# Patient Record
Sex: Female | Born: 1975 | Hispanic: Yes | Marital: Married | Smoking: Never smoker
Health system: Southern US, Community
[De-identification: ages and names within clinical notes are randomized; demographics above are authoritative.]

## PROBLEM LIST (undated history)

## (undated) ENCOUNTER — Emergency Department (HOSPITAL_COMMUNITY): Admission: EM | Payer: Self-pay | Source: Home / Self Care

## (undated) DIAGNOSIS — C801 Malignant (primary) neoplasm, unspecified: Secondary | ICD-10-CM

---

## 2013-01-15 ENCOUNTER — Encounter (HOSPITAL_COMMUNITY): Payer: Self-pay | Admitting: Emergency Medicine

## 2013-01-15 ENCOUNTER — Other Ambulatory Visit: Payer: Self-pay

## 2013-01-15 ENCOUNTER — Emergency Department (HOSPITAL_COMMUNITY)
Admission: EM | Admit: 2013-01-15 | Discharge: 2013-01-15 | Disposition: A | Discharge status: Home / Self Care | Payer: Self-pay | Attending: Emergency Medicine | Admitting: Emergency Medicine

## 2013-01-15 ENCOUNTER — Emergency Department (HOSPITAL_COMMUNITY): Payer: Self-pay

## 2013-01-15 DIAGNOSIS — R0789 Other chest pain: Secondary | ICD-10-CM

## 2013-01-15 DIAGNOSIS — M25519 Pain in unspecified shoulder: Secondary | ICD-10-CM | POA: Insufficient documentation

## 2013-01-15 DIAGNOSIS — IMO0001 Reserved for inherently not codable concepts without codable children: Secondary | ICD-10-CM | POA: Insufficient documentation

## 2013-01-15 DIAGNOSIS — Z79899 Other long term (current) drug therapy: Secondary | ICD-10-CM | POA: Insufficient documentation

## 2013-01-15 DIAGNOSIS — R071 Chest pain on breathing: Secondary | ICD-10-CM | POA: Insufficient documentation

## 2013-01-15 LAB — POCT I-STAT, CHEM 8
Calcium, Ion: 1.18 mmol/L (ref 1.12–1.23)
Glucose, Bld: 86 mg/dL (ref 70–99)
HCT: 39 % (ref 36.0–46.0)
TCO2: 22 mmol/L (ref 0–100)

## 2013-01-15 LAB — POCT I-STAT TROPONIN I: Troponin i, poc: 0 ng/mL (ref 0.00–0.08)

## 2013-01-15 MED ORDER — METHOCARBAMOL 500 MG PO TABS: 500.0000 mg | ORAL_TABLET | Freq: Two times a day (BID) | ORAL | Status: DC

## 2013-01-15 MED ORDER — KETOROLAC TROMETHAMINE 60 MG/2ML IM SOLN
60.0000 mg | Freq: Once | INTRAMUSCULAR | Status: AC
  Administered 2013-01-15: 60 mg via INTRAMUSCULAR
  Filled 2013-01-15: qty 2

## 2013-01-15 MED ORDER — METHOCARBAMOL 500 MG PO TABS
500.0000 mg | ORAL_TABLET | Freq: Once | ORAL | Status: AC
  Administered 2013-01-15: 500 mg via ORAL
  Filled 2013-01-15: qty 1

## 2013-01-15 MED ORDER — IBUPROFEN 600 MG PO TABS: 600.0000 mg | ORAL_TABLET | Freq: Four times a day (QID) | ORAL | Status: DC | PRN

## 2013-01-15 NOTE — ED Notes (Signed)
PT. REPORTS LEFT CHEST PAIN ONSET YESTERDAY RADIATING TO LEFT ARM ,SLIGHT SOB ,DENIES NAUSEA OR VOMITTING , NO DIAPHORESIS . PT. TOOK 2 ASA PO PTA.

## 2013-01-15 NOTE — ED Notes (Addendum)
0522  Introduced self to the pt.  Family at bedside with the pt.  0630 Pt resting and no complaints at this time.

## 2013-01-15 NOTE — ED Provider Notes (Signed)
History    CSN: [id number] Arrival date & time 01/15/13  0457  First MD Initiated Contact with Patient 01/15/13 0515     Chief Complaint  Patient presents with  . Chest Pain   (Consider location/radiation/quality/duration/timing/severity/associated sxs/prior Treatment) HPI Pt presents with 1 day of L sided chest and shoulder pain. No known trauma or heavy lifting. Pain is worse with movement of L shoulder and palpation of chest wall. No cough, SOB, lower ext swelling or pain. No known PE or CAD risk factors.  History reviewed. No pertinent past medical history. Past Surgical History  Procedure Laterality Date  . Cesarean section     No family history on file. History  Substance Use Topics  . Smoking status: Never Smoker   . Smokeless tobacco: Not on file  . Alcohol Use: No   OB History   Grav Para Term Preterm Abortions TAB SAB Ect Mult Living                 Review of Systems  Constitutional: Negative for fever and chills.  Respiratory: Negative for cough, shortness of breath and wheezing.   Cardiovascular: Positive for chest pain. Negative for palpitations and leg swelling.  Gastrointestinal: Negative for nausea, vomiting and abdominal pain.  Musculoskeletal: Positive for myalgias. Negative for back pain.  Skin: Negative for rash and wound.  Neurological: Negative for dizziness, weakness, light-headedness, numbness and headaches.  All other systems reviewed and are negative.    Allergies  Review of patient's allergies indicates no known allergies.  Home Medications   Current Outpatient Rx  Name  Route  Sig  Dispense  Refill  . ibuprofen (ADVIL,MOTRIN) 600 MG tablet   Oral   Take 1 tablet (600 mg total) by mouth every 6 (six) hours as needed for pain.   30 tablet   0   . methocarbamol (ROBAXIN) 500 MG tablet   Oral   Take 1 tablet (500 mg total) by mouth 2 (two) times daily.   20 tablet   0    BP 102/67  Pulse 73  Temp(Src) 98.2 F (36.8 C) (Oral)   Resp 20  SpO2 100%  LMP 01/05/2013 Physical Exam  Nursing note and vitals reviewed. Constitutional: She is oriented to person, place, and time. She appears well-developed and well-nourished. No distress.  HENT:  Head: Normocephalic and atraumatic.  Mouth/Throat: Oropharynx is clear and moist.  Eyes: EOM are normal. Pupils are equal, round, and reactive to light.  Neck: Normal range of motion. Neck supple.  Cardiovascular: Normal rate and regular rhythm.   Pulmonary/Chest: Effort normal and breath sounds normal. No respiratory distress. She has no wheezes. She has no rales. She exhibits tenderness (reproduced L chest wall tenderness with palpation. no crepitance).  Abdominal: Soft. Bowel sounds are normal. She exhibits no distension and no mass. There is no tenderness. There is no rebound and no guarding.  Musculoskeletal: Normal range of motion. She exhibits tenderness (TTP over L detoid with pain with ROM of L shoulder). She exhibits no edema.  Neurological: She is alert and oriented to person, place, and time.  5/5 motor in all ext, sensation intact  Skin: Skin is warm and dry. No rash noted. No erythema.  Psychiatric: She has a normal mood and affect. Her behavior is normal.    ED Course  Procedures (including critical care time) Labs Reviewed  POCT I-STAT, CHEM 8  POCT I-STAT TROPONIN I   Dg Chest 2 View  01/15/2013   *RADIOLOGY REPORT*  Clinical Data: Left chest pain radiating to the left arm since yesterday.  Shortness of breath.  CHEST - 2 VIEW  Comparison: None.  Findings: Normal heart size and pulmonary vascularity.  Calcified granulomas in the lungs.  No focal airspace consolidation.  No blunting of costophrenic angles.  No pneumothorax.  Mediastinal contours appear intact.  IMPRESSION: No evidence of active pulmonary disease.   Original Report Authenticated By: Burman Nieves, M.D.   1. Chest wall pain      Date: 01/15/2013  Rate: 62  Rhythm: normal sinus rhythm  QRS  Axis: normal  Intervals: normal  ST/T Wave abnormalities: normal  Conduction Disutrbances:none  Narrative Interpretation:   Old EKG Reviewed: none available   MDM  PERC neg, No significant CAD risk factors. Pain is musculoskeletal on exam. Will screen and singe trop should be sufficient to R/O MI given duration of symptoms.  Normal work up. Will treat symptomatically. Return precautions given.   Loren Racer, MD 01/15/13 [phone]

## 2016-01-02 ENCOUNTER — Emergency Department (HOSPITAL_COMMUNITY)
Admission: EM | Admit: 2016-01-02 | Discharge: 2016-01-02 | Disposition: A | Discharge status: Home / Self Care | Payer: Self-pay | Attending: Emergency Medicine | Admitting: Emergency Medicine

## 2016-01-02 ENCOUNTER — Encounter (HOSPITAL_COMMUNITY): Payer: Self-pay | Admitting: Emergency Medicine

## 2016-01-02 DIAGNOSIS — L723 Sebaceous cyst: Secondary | ICD-10-CM | POA: Insufficient documentation

## 2016-01-02 DIAGNOSIS — L729 Follicular cyst of the skin and subcutaneous tissue, unspecified: Secondary | ICD-10-CM

## 2016-01-02 NOTE — ED Provider Notes (Signed)
CSN: [id number]     Arrival date & time 01/02/16  1911 History  By signing my name below, I, Christina Gutierrez, attest that this documentation has been prepared under the direction and in the presence of Christina Decaire Camprubi-Soms, PA-C. Electronically Signed: Placido SouLogan Gutierrez, ED Scribe. 01/02/2016. 8:41 PM.   Chief Complaint  Patient presents with  . Abscess   Patient is a 40 y.o. female presenting with abscess. The history is provided by the patient. A language interpreter was used (provider).  Abscess Location:  Head/neck Head/neck abscess location:  Scalp Abscess quality: painful   Abscess quality: not draining, no fluctuance, no redness and no warmth   Red streaking: no   Duration: 7847yrs. Progression:  Worsening Pain details:    Quality:  Throbbing   Severity:  Moderate   Duration:  4 days   Timing:  Constant   Progression:  Worsening Chronicity:  Chronic Context: not immunosuppression   Relieved by:  Nothing Exacerbated by: palpation. Ineffective treatments:  Oral antibiotics and NSAIDs (diclofenac) Associated symptoms: no fever, no nausea and no vomiting     HPI Comments: Christina Gutierrez is a 40 y.o. female who presents to the Emergency Department complaining of a point of pain and "knot" to her posterior scalp that has been present x 12 years which worsened beginning 4-5 days ago. She describes her pain as 8/10, constant, throbbing and non-radiating. Her pain worsens with palpation. Pt was seen at a clinic recently for her symptoms and was d/c with diclofenac and an abx which she is unsure of the name, and denies relief with either, further noting she has nearly finished her abx. She denies redness, drainage, warmth, red streaking, fevers, chills, CP, SOB, abd pain, n/v/d/c, dysuria, hematuria, numbness, tingling and weakness. She denies having a PCP at this time.   History reviewed. No pertinent past medical history. Past Surgical History  Procedure Laterality Date  .  Cesarean section     No family history on file. Social History  Substance Use Topics  . Smoking status: Never Smoker   . Smokeless tobacco: None  . Alcohol Use: No   OB History    No data available     Review of Systems  Constitutional: Negative for fever and chills.  Respiratory: Negative for shortness of breath.   Cardiovascular: Negative for chest pain.  Gastrointestinal: Negative for nausea, vomiting, abdominal pain, diarrhea and constipation.  Genitourinary: Negative for dysuria and hematuria.  Musculoskeletal: Positive for myalgias (pain at the scalp knot area). Negative for arthralgias.  Skin: Negative for color change and rash.       +scalp "knot"  Allergic/Immunologic: Negative for immunocompromised state.  Neurological: Negative for weakness and numbness.  Psychiatric/Behavioral: Negative for confusion.   A complete 10 system review of systems was obtained and all systems are negative except as noted in the HPI and PMH.    Allergies  Review of patient's allergies indicates no known allergies.  Home Medications   Prior to Admission medications   Medication Sig Start Date End Date Taking? Authorizing Provider  ibuprofen (ADVIL,MOTRIN) 600 MG tablet Take 1 tablet (600 mg total) by mouth every 6 (six) hours as needed for pain. 01/15/13   Loren Raceravid Yelverton, MD  methocarbamol (ROBAXIN) 500 MG tablet Take 1 tablet (500 mg total) by mouth 2 (two) times daily. 01/15/13   Loren Raceravid Yelverton, MD   BP 102/63 mmHg  Pulse 72  Temp(Src) 98.2 F (36.8 C) (Oral)  Resp 18  Ht 5' (1.524 m)  Wt  160 lb (72.576 kg)  BMI 31.25 kg/m2  SpO2 99%  LMP 12/02/2015    Physical Exam  Constitutional: She is oriented to person, place, and time. Vital signs are normal. She appears well-developed and well-nourished.  Non-toxic appearance. No distress.  Afebrile, nontoxic, NAD  HENT:  Head: Normocephalic and atraumatic.  Mouth/Throat: Mucous membranes are normal.  Eyes: Conjunctivae and EOM  are normal. Right eye exhibits no discharge. Left eye exhibits no discharge.  Neck: Normal range of motion. Neck supple.  Cardiovascular: Normal rate.   Pulmonary/Chest: Effort normal. No respiratory distress.  Abdominal: Normal appearance. She exhibits no distension.  Musculoskeletal: Normal range of motion.  Neurological: She is alert and oriented to person, place, and time. She has normal strength. No sensory deficit.  Skin: Skin is warm, dry and intact. No rash noted.  Posterior scalp with a small ~5 mm well circumscribed circular cyst, with no erythema or warmth, no drainage or swelling, no fluctuance, slightly firm and rubbery, with mild TTP. No surrounding cellulitis. Easily mobile.   Psychiatric: She has a normal mood and affect. Her behavior is normal.  Nursing note and vitals reviewed.   ED Course  Procedures  DIAGNOSTIC STUDIES: Oxygen Saturation is 99% on RA, normal by my interpretation.    COORDINATION OF CARE: 8:40 PM Discussed next steps with pt. Pt verbalized understanding and is agreeable with the plan.   Labs Review Labs Reviewed - No data to display  Imaging Review No results found.   EKG Interpretation None      MDM   Final diagnoses:  Scalp cyst    40 y.o. female with a sebaceous cyst of scalp, present for 8052yrs but became swollen and painful several days ago, seen at a clinic and given Abx and diclofenac which she states hasn't helped. Discussed that this area doesn't appear to be an abscess, feels like a cyst, and doesn't appear infected. Continue abx until completed. Use heat and tylenol/motrin for pain. F/up with [facility] in 1-2wks to establish care and for ultimate management/excision of this cyst. I explained the diagnosis and have given explicit precautions to return to the ER including for any other new or worsening symptoms. The patient understands and accepts the medical plan as it's been dictated and I have answered their questions. Discharge  instructions concerning home care and prescriptions have been given. The patient is STABLE and is discharged to home in good condition.   I personally performed the services described in this documentation, which was scribed in my presence. The recorded information has been reviewed and is accurate.  BP 102/63 mmHg  Pulse 72  Temp(Src) 98.2 F (36.8 C) (Oral)  Resp 18  Ht 5' (1.524 m)  Wt 72.576 kg  BMI 31.25 kg/m2  SpO2 99%  LMP 12/02/2015  No orders of the defined types were placed in this encounter.      76 Wagon RoadMercedes Camprubi-Soms, PA-C 01/02/16 2056  Gwyneth SproutWhitney Plunkett, MD 01/04/16 [phone]

## 2016-01-02 NOTE — ED Notes (Signed)
Pt has red raised knot on the back of her head. States it is very painful to touch.

## 2016-01-02 NOTE — Discharge Instructions (Signed)
Continue taking your home antibiotic until it's completed. Use home pain medications (diclofenac) OR ibuprofen, as well as tylenol as needed for pain. Use heat to the area to help with pain. Follow up with [facility] in 1-2 weeks for recheck of symptoms and to establish medical care. Return to the ER for changes or worsening symptoms.

## 2016-01-10 ENCOUNTER — Ambulatory Visit: Payer: Self-pay | Attending: Internal Medicine | Admitting: Physician Assistant

## 2016-01-10 ENCOUNTER — Encounter: Payer: Self-pay | Admitting: Physician Assistant

## 2016-01-10 VITALS — BP 110/70 | HR 63 | Temp 98.2°F | Resp 16 | Wt 152.0 lb

## 2016-01-10 DIAGNOSIS — L02811 Cutaneous abscess of head [any part, except face]: Secondary | ICD-10-CM

## 2016-01-10 MED ORDER — FLUCONAZOLE 150 MG PO TABS: 150.0000 mg | ORAL_TABLET | Freq: Once | ORAL | Status: DC

## 2016-01-10 MED ORDER — DOXYCYCLINE HYCLATE 100 MG PO TABS: 100.0000 mg | ORAL_TABLET | Freq: Two times a day (BID) | ORAL | Status: DC

## 2016-01-10 NOTE — Progress Notes (Signed)
Pt is in the office today for a scalp cyst Pt was seen in the ED for the cyst 5 days ago the cyst busted Pt pain level today in the office is a 2 Pt stated it still feels full

## 2016-01-10 NOTE — Progress Notes (Signed)
Patient ID: Christina Gutierrez, female   DOB: 1976/04/26, 40 y.o.   MRN: [id number]   Christina Gutierrez, is a 40 y.o. female  [id number]  [id number]  DOB - 1976/04/26  Chief Complaint  Patient presents with  . Cyst        Subjective:  Chief Complaint and HPI: Christina Gutierrez is a 40 y.o. female here today to establish care and for a follow up vist after being seen at an Urgent care and the ED(01/02/2016) for an abscess on her posterior scalp. She took antibiotics for about 7 days.  She first noticed a tiny lump on her posterior scalp about 12 years ago.  It never bothered her until about 1 month ago.  It has never drained or been painful until recently.  [employer] interpreter used.  Painful and draining now with some improvement but no resolution since starting antibiotics. PMH is unremarkable.  Her last period was a couple of weeks ago and she has had her tubes tied.  ED notes reviewed.    ROS:   Constitutional:  No f/c, No night sweats, No unexplained weight loss. EENT:  No vision changes, No blurry vision, No hearing changes. No mouth, throat, or ear problems.  Respiratory: No cough, No SOB Cardiac: No CP, no palpitations GI:  No abd pain, No N/V/D. GU: No Urinary s/sx Musculoskeletal: No joint pain Neuro: No headache, no dizziness, no motor weakness.  Skin: No rash Endocrine:  No polydipsia. No polyuria.  Psych: Denies SI/HI  No problems updated.  ALLERGIES: No Known Allergies  PAST MEDICAL HISTORY: No past medical history on file.  MEDICATIONS AT HOME: Prior to Admission medications   Medication Sig Start Date End Date Taking? Authorizing Provider  ibuprofen (ADVIL,MOTRIN) 600 MG tablet Take 1 tablet (600 mg total) by mouth every 6 (six) hours as needed for pain. 01/15/13  Yes Loren Raceravid Yelverton, MD  methocarbamol (ROBAXIN) 500 MG tablet Take 1 tablet (500 mg total) by mouth 2 (two) times daily. 01/15/13  Yes Loren Raceravid Yelverton, MD  doxycycline  (VIBRA-TABS) 100 MG tablet Take 1 tablet (100 mg total) by mouth 2 (two) times daily. 01/10/16   Anders SimmondsAngela M McClung, PA-C  fluconazole (DIFLUCAN) 150 MG tablet Take 1 tablet (150 mg total) by mouth once. 01/10/16   Anders SimmondsAngela M McClung, PA-C     Objective:  EXAM:   Filed Vitals:   01/10/16 1053  BP: 110/70  Pulse: 63  Temp: 98.2 F (36.8 C)  TempSrc: Oral  Resp: 16  Weight: 152 lb (68.947 kg)  SpO2: 99%    General appearance : A&OX3. NAD. Non-toxic-appearing HEENT: Atraumatic and Normocephalic. Neck: supple, no JVD. No cervical lymphadenopathy. No thyromegaly Chest/Lungs:  Breathing-non-labored, Good air entry bilaterally, breath sounds normal without rales, rhonchi, or wheezing  CVS: S1 S2 regular, no murmurs, gallops, rubs  Neurology:  CN II-XII grossly intact, Non focal.   Psych:  TP linear. J/I WNL. Normal speech. Appropriate eye contact and affect.  Skin on scalp-R posterior scalp with a 1cm abscess that is slightly fluctuant and drains bloody purulent fluid when lightly manipulated.  No surrounding induration.  Culture taken.  Data Review No results found for: HGBA1C   Assessment & Plan   1. Abscess, scalp Doxycycline 100mg  bid X 10days and warm compresses.  Diflucan sent if needed - WOUND CULTURE - Ambulatory referral to Dermatology for definitive removal of cyst(likely sebaceous cyst)  Patient have been counseled extensively about nutrition and exercise  Return in about 6 weeks (around 02/21/2016) for  cpe and bloodwork/establish care.  The patient was given clear instructions to go to ER or return to medical center if symptoms don't improve, worsen or new problems develop. The patient verbalized understanding. The patient was told to call to get lab results if they haven't heard anything in the next week.     Georgian CoAngela McClung, PA-C [facility] [place], [place] [phone]   01/10/2016, 6:56 PM

## 2016-01-10 NOTE — Patient Instructions (Signed)
Absceso °(Abscess) ° Un absceso es una zona infectada que contiene pus y desechos. Puede aparecer en cualquier parte del cuerpo. También se lo conoce como forúnculo o divieso. °CAUSAS  °Ocurre cuando los tejidos se infectan. También puede formarse por obstrucción de las glándulas sebáceas o las glándulas sudoríparas, infección de los folículos pilosos o por una lesión pequeña en la piel. A medida que el organismo lucha contra la infección, se acumula pus en la zona y hace presión debajo de la piel. Esta presión causa dolor. Las personas con un sistema inmunológico debilitado tienen dificultad para luchar contra las infecciones y pueden formar abscesos con más frecuencia.  °SÍNTOMAS  °Generalmente un absceso se forma sobre la piel y se vuelve una masa dolorosa, roja, caliente y sensible. Si se forma debajo de la piel, podrá sentir como una zona blanda, que se mueve, debajo de la piel. Algunos abscesos se abren (ruptura) por sí mismos, pero la mayoría seguirá empeorando si no se lo trata. La infección puede diseminarse hacia otros sitios del cuerpo y finalmente al torrente sanguíneo y hace que el enfermo se sienta mal.  °DIAGNÓSTICO  °El médico le hará una historia clínica y un examen físico. Podrán tomarle una muestra de líquido del absceso y analizarlo para encontrar la causa de la infección. .  °TRATAMIENTO  °El médico le indicará antibióticos para combatir la infección. Sin embargo, el uso de antibióticos solamente no curará el absceso. El médico tendrá que hacer un pequeño corte (incisión) en el absceso para drenar el pus. En algunos casos se introduce una gasa en el absceso para reducir el dolor y que siga drenando la zona.  °INSTRUCCIONES PARA EL CUIDADO EN EL HOGAR  °· Solo tome medicamentos de venta libre o recetados para el dolor, malestar o fiebre, según las indicaciones del médico. °· Si le han recetado antibióticos, tómelos según las indicaciones. Tómelos todos, aunque se sienta mejor. °· Si le aplicaron  una gasa, siga las indicaciones del médico para cambiarla. °· Para evitar la propagación de la infección: °¨ Mantenga el absceso cubierto con el vendaje. °¨ Lávese bien las manos. °¨ No comparta artículos de cuidado personal, toallas o jacuzzis con los demás. °¨ Evite el contacto con la piel de otras personas. °· Mantenga la piel y la ropa limpia alrededor del absceso. °· Cumpla con todas las visitas de control, según le indique su médico. °SOLICITE ATENCIÓN MÉDICA SI:  °· Aumenta el dolor, la hinchazón, el enrojecimiento, drena líquido o sangra. °· Siente dolores musculares, escalofríos, o una sensación general de malestar. °· Tiene fiebre. °ASEGÚRESE DE QUE:  °· Comprende estas instrucciones. °· Controlará su enfermedad. °· Solicitará ayuda de inmediato si no mejora o si empeora. °  °Esta información no tiene como fin reemplazar el consejo del médico. Asegúrese de hacerle al médico cualquier pregunta que tenga. °  °Document Released: 06/23/2005 Document Revised: 12/23/2011 °Elsevier Interactive Patient Education ©2016 Elsevier Inc. ° °

## 2016-01-13 LAB — WOUND CULTURE
GRAM STAIN: NONE SEEN
Gram Stain: NONE SEEN
Gram Stain: NONE SEEN
Organism ID, Bacteria: NO GROWTH

## 2016-01-24 ENCOUNTER — Ambulatory Visit: Payer: Self-pay | Attending: Physician Assistant | Admitting: Physician Assistant

## 2016-01-24 ENCOUNTER — Encounter: Payer: Self-pay | Admitting: Physician Assistant

## 2016-01-24 VITALS — BP 111/72 | HR 63 | Temp 98.0°F | Resp 16 | Wt 150.0 lb

## 2016-01-24 DIAGNOSIS — L723 Sebaceous cyst: Secondary | ICD-10-CM

## 2016-01-24 MED ORDER — DICLOFENAC SODIUM 75 MG PO TBEC: 75.0000 mg | DELAYED_RELEASE_TABLET | Freq: Two times a day (BID) | ORAL | Status: DC

## 2016-01-24 NOTE — Progress Notes (Signed)
Lump on head for 3 years, ruptured (pus drainage noted) and was treated with pills by ED.  Since rupture has experienced pain, possible nerve symptoms:? completed Diclofenac and SMZ/TMPDS 800/160 bid for 7 days filled 12/31/15. Recent office visit here 01/10/16 was given doxycycline 100mg  bid x 10 days.  Also uses otc icy/hot. Pollyann KennedyKim Becton, RN, BSN

## 2016-01-24 NOTE — Progress Notes (Signed)
Patient ID: Christina LoaJeannette Cavan, female   DOB: 30-Oct-1975, [age] y.o.   MRN: [id number]   Christina LoaJeannette Corniel, is a 40 y.o. female  [id number]  [id number]  DOB - 30-Oct-1975  Subjective:  Chief Complaint and HPI: Christina Gutierrez is a 40 y.o. female here today for f/up of the draining cyst on her head.  [employer] interpreters used. See last notes.  Seen at an urgent care then ED then here.  Has been on Septra X 7 days, then Doxycycline X 10 days after long-standing(12 years) sebaceous cyst on her scalp became infected. She also reports being seen "at another office" a few days ago and was given amoxicillin.  A referral is in process to have the cyst removed.  She still needs to apply for the [facility] card/[facility] discount before she is able to make the appointment.  Today she is requesting more antibiotics and diclofenac for pain. But, the area is much improved.    ROS:   Constitutional:  No f/c, No night sweats, No unexplained weight loss. EENT:  No vision changes, No blurry vision, No hearing changes. No mouth, throat, or ear problems.  Respiratory: No cough, No SOB Cardiac: No CP, no palpitations GI:  No abd pain, No N/V/D. GU: No Urinary s/sx Musculoskeletal: No joint pain Neuro: + headache at location of cyst, no dizziness, no motor weakness.  Skin: No rash Endocrine:  No polydipsia. No polyuria.  Psych: Denies SI/HI  No problems updated.  ALLERGIES: No Known Allergies  PAST MEDICAL HISTORY: No past medical history on file.  MEDICATIONS AT HOME: Prior to Admission medications   Medication Sig Start Date End Date Taking? Authorizing Provider  diclofenac (VOLTAREN) 75 MG EC tablet Take 1 tablet (75 mg total) by mouth 2 (two) times daily. Prn pain 01/24/16   Anders SimmondsAngela M McClung, PA-C  methocarbamol (ROBAXIN) 500 MG tablet Take 1 tablet (500 mg total) by mouth 2 (two) times daily. 01/15/13   Loren Raceravid Yelverton, MD     Objective:  Francia GreavesEXAMCeasar Mons:   Filed Vitals:   01/24/16  0933  BP: 111/72  Pulse: 63  Temp: 98 F (36.7 C)  TempSrc: Oral  Resp: 16  Weight: 150 lb (68.04 kg)  SpO2: 99%    General appearance : A&OX3. NAD. Non-toxic-appearing HEENT: Atraumatic and Normocephalic.  PERRLA. Neck: supple, no JVD. No cervical lymphadenopathy. No thyromegaly Scalp:  The L posterior?occipital region is much improved since I saw her before.  There is a 3-314mm firm cyst without induration, erythema, or active draining.  There is a small scabbed area centrally.  Chest/Lungs:  Breathing-non-labored, Good air entry bilaterally, breath sounds normal without rales, rhonchi, or wheezing  CVS: S1 S2 regular, no murmurs, gallops, rubs  Neurology:  CN II-XII grossly intact, Non focal.   Psych:  TP linear. J/I WNL. Normal speech. Appropriate eye contact and affect.  Skin:  No Rash  Data Review No results found for: HGBA1C   Assessment & Plan   1. Sebaceous cyst Scalp-referral in process and reviewed orange card application and [facility] discount information with patient so we can set up dermatology appointment.  Also discussed she can make payent arrangements up front with the dermatology office if she prefers.  Currently, antibiotics are not indicated.  I did refill the diclofenac for pain.    Patient have been counseled extensively about nutrition and exercise  F/up for CPE and establish care 3-6 months;  Sooner if needed.   The patient was given clear instructions to go to ER or  return to medical center if symptoms don't improve, worsen or new problems develop. The patient verbalized understanding. The patient was told to call to get lab results if they haven't heard anything in the next week.     Georgian Co, PA-C [facility] [place], [place] [phone]   01/24/2016, 1:43 PM

## 2016-01-24 NOTE — Patient Instructions (Signed)
Quiste epidrmico  (Epidermal Cyst) Un quiste epidrmico se denomina tambin quiste sebceo, quiste de inclusin epidrmica o quiste infundibular. Estos quistes contienen una sustancia "pastosa" o similar al "queso" y puede tener mal olor. Esta sustancia es una protena denominada [place]keratina. Estos quistes generalmente se forman en el rostro, el cuello o el tronco. Tambin pueden aparecer en la zona vaginal u otras partes de los genitales, tanto en hombres como en mujeres. En general son pequeos bultos indoloros, que crecen lentamente y que se mueven libremente debajo de la piel. Es importante no tratar de apretarlos para extraer la sustancia que contienen. Esto puede ocasionar una infeccin que origine dolor e hinchazn en el rea.  CAUSAS  La causa del puede ser una lesin penetrante profunda o un folculo piloso obstruido, generalmente asociado al acn.  SNTOMAS  Los quistes epidermicos pueden inflamarse y causar:   Enrojecimiento.  Sensibilidad.  Aumento de la temperatura en la zona.  Material que drena de color blanco grisceo, consistente y de PG[employer] Corporationolor desagradable. DIAGNSTICO Generalmente estas infecciones son diagnosticadas por Medical illustratorel profesional durante el examen fsico. En raras ocasiones ser necesario realizar una biopsia para descartar otros trastornos que parezcan ser similares.  TRATAMIENTO  Generalmente mejoran y desaparecen sin tratamiento. No suelen ser peligrosos.  Pueden inflamarse y sensibilizarse si se infectan. Esto puede requerir Warden/rangerla apertura y drenaje del quiste. Podr ser necesaria la administracin de antibiticos. Cuando la infeccin haya desaparecido, el quiste podr eliminarse con Futures traderuna ciruga menor.  Los pequeos quistes inflamados generalmente pueden tratarse inyectado corticoides con los antibiticos.  En algunos casos el quiste se [place]anda y puede ser [place] preocupacin. Si esto ocurre, es necesario extirparlo quirrgicamente en el consultorio del  profesional. INSTRUCCIONES PARA EL CUIDADO EN EL HOGAR   Tome slo medicamentos de venta libre o recetados, segn las indicaciones del mdico.  Tome los antibiticos como se le indic. Tmelos todos, aunque se sienta mejor. SOLICITE ATENCIN MDICA SI:   Siente dolor, observa enrojecimiento o hinchazn.  El problema no mejora, o empeora.  Tiene preguntas o preocupaciones. ASEGRESE DE QUE:   Comprende estas instrucciones.  Controlar su enfermedad.  Solicitar ayuda de inmediato si no mejora o si empeora.   Esta informacin no tiene Theme park managercomo fin reemplazar el consejo del mdico. Asegrese de hacerle al mdico cualquier pregunta que tenga.   Document Released: 08/04/2006 Document Revised: 09/15/2011 Elsevier Interactive Patient Education [employer]2016 Elsevier Inc.

## 2017-08-08 ENCOUNTER — Emergency Department (HOSPITAL_COMMUNITY): Payer: Self-pay

## 2017-08-08 ENCOUNTER — Emergency Department (HOSPITAL_COMMUNITY)
Admission: EM | Admit: 2017-08-08 | Discharge: 2017-08-08 | Disposition: A | Discharge status: Home / Self Care | Payer: Self-pay | Attending: Emergency Medicine | Admitting: Emergency Medicine

## 2017-08-08 ENCOUNTER — Encounter (HOSPITAL_COMMUNITY): Payer: Self-pay | Admitting: Emergency Medicine

## 2017-08-08 DIAGNOSIS — R109 Unspecified abdominal pain: Secondary | ICD-10-CM

## 2017-08-08 DIAGNOSIS — R1084 Generalized abdominal pain: Secondary | ICD-10-CM | POA: Insufficient documentation

## 2017-08-08 LAB — POC URINE PREG, ED: PREG TEST UR: NEGATIVE

## 2017-08-08 LAB — URINALYSIS, ROUTINE W REFLEX MICROSCOPIC
BACTERIA UA: NONE SEEN
BILIRUBIN URINE: NEGATIVE
Glucose, UA: NEGATIVE mg/dL
HGB URINE DIPSTICK: NEGATIVE
Ketones, ur: NEGATIVE mg/dL
Nitrite: NEGATIVE
Protein, ur: NEGATIVE mg/dL
SPECIFIC GRAVITY, URINE: 1.017 (ref 1.005–1.030)
pH: 5 (ref 5.0–8.0)

## 2017-08-08 MED ORDER — KETOROLAC TROMETHAMINE 60 MG/2ML IM SOLN
60.0000 mg | Freq: Once | INTRAMUSCULAR | Status: AC
Start: 2017-08-08 — End: 2017-08-08
  Administered 2017-08-08: 60 mg via INTRAMUSCULAR
  Filled 2017-08-08: qty 2

## 2017-08-08 NOTE — Discharge Instructions (Signed)
Discuss a CT angiogram for further details of your CT you had today with a primary doctor.  If you were given medicines take as directed.  If you are on coumadin or contraceptives realize their levels and effectiveness is altered by many different medicines.  If you have any reaction (rash, tongues swelling, other) to the medicines stop taking and see a physician.    If your blood pressure was elevated in the ER make sure you follow up for management with a primary doctor or return for chest pain, shortness of breath or stroke symptoms.  Please follow up as directed and return to the ER or see a physician for new or worsening symptoms.  Thank you. Vitals:   08/08/17 1343 08/08/17 1400 08/08/17 1430  BP: 118/66 99/68 94/68   Pulse: 72 76 71  Resp: 18    Temp: 98.2 F (36.8 C)    TempSrc: Oral    SpO2: 100% 99% 99%

## 2017-08-08 NOTE — ED Triage Notes (Signed)
Pt presents with two weeks of left flank pain with burning with urination.

## 2017-08-08 NOTE — ED Provider Notes (Signed)
[facility] EMERGENCY DEPARTMENT Provider Note   CSN: [id number] Arrival date & time: 08/08/17  1334     History   Chief Complaint Chief Complaint  Patient presents with  . Flank Pain    HPI Christina Gutierrez is a 42 y.o. female.  42 year old female presents with 2 weeks of bilateral flank pain as well as some dysuria.  Denies any vaginal bleeding or discharge.  No fever or chills.  No vomiting.  Pain is worse with movement and better with remaining still.  Does have a history of UTIs in the past and feels that this is similar.  Has been using over-the-counter medications without relief.  No rashes or burning to the skin noted.  Has had radiation down her leg at times and symptoms are better in the morning      History reviewed. No pertinent past medical history.  There are no active problems to display for this patient.   Past Surgical History:  Procedure Laterality Date  . CESAREAN SECTION      OB History    No data available       Home Medications    Prior to Admission medications   Medication Sig Start Date End Date Taking? Authorizing Provider  diclofenac (VOLTAREN) 75 MG EC tablet Take 1 tablet (75 mg total) by mouth 2 (two) times daily. Prn pain 01/24/16   Anders Simmonds, PA-C  methocarbamol (ROBAXIN) 500 MG tablet Take 1 tablet (500 mg total) by mouth 2 (two) times daily. 01/15/13   Loren Racer, MD    Family History History reviewed. No pertinent family history.  Social History Social History   Tobacco Use  . Smoking status: Never Smoker  . Smokeless tobacco: Never Used  Substance Use Topics  . Alcohol use: No  . Drug use: No     Allergies   Patient has no known allergies.   Review of Systems Review of Systems  All other systems reviewed and are negative.    Physical Exam Updated Vital Signs BP 99/68   Pulse 76   Temp 98.2 F (36.8 C) (Oral)   Resp 18   SpO2 99%   Physical Exam  Constitutional:  She is oriented to person, place, and time. She appears well-developed and well-nourished.  Non-toxic appearance. No distress.  HENT:  Head: Normocephalic and atraumatic.  Eyes: Conjunctivae, EOM and lids are normal. Pupils are equal, round, and reactive to light.  Neck: Normal range of motion. Neck supple. No tracheal deviation present. No thyroid mass present.  Cardiovascular: Normal rate, regular rhythm and normal heart sounds. Exam reveals no gallop.  No murmur heard. Pulmonary/Chest: Effort normal and breath sounds normal. No stridor. No respiratory distress. She has no decreased breath sounds. She has no wheezes. She has no rhonchi. She has no rales.  Abdominal: Soft. Normal appearance and bowel sounds are normal. She exhibits no distension. There is no tenderness. There is no rebound and no CVA tenderness.  Musculoskeletal: Normal range of motion. She exhibits no edema or tenderness.       Back:  Neurological: She is alert and oriented to person, place, and time. She has normal strength. No cranial nerve deficit or sensory deficit. GCS eye subscore is 4. GCS verbal subscore is 5. GCS motor subscore is 6.  Skin: Skin is warm and dry. No abrasion and no rash noted.  Psychiatric: She has a normal mood and affect. Her speech is normal and behavior is normal.  Nursing note and  vitals reviewed.    ED Treatments / Results  Labs (all labs ordered are listed, but only abnormal results are displayed) Labs Reviewed  URINALYSIS, ROUTINE W REFLEX MICROSCOPIC - Abnormal; Notable for the following components:      Result Value   Leukocytes, UA SMALL (*)    Squamous Epithelial / LPF 0-5 (*)    All other components within normal limits  POC URINE PREG, ED    EKG  EKG Interpretation None       Radiology No results found.  Procedures Procedures (including critical care time)  Medications Ordered in ED Medications - No data to display   Initial Impression / Assessment and Plan / ED  Course  I have reviewed the triage vital signs and the nursing notes.  Pertinent labs & imaging results that were available during my care of the patient were reviewed by me and considered in my medical decision making (see chart for details).     She medicated here with Toradol.  Urinalysis without infection.  Patient likely muscle skeletal back pain but have ordered renal CT.  Care signed out to Dr. Jodi MourningZavitz  Final Clinical Impressions(s) / ED Diagnoses   Final diagnoses:  None    ED Discharge Orders    None       Lorre NickAllen, Lashundra Shiveley, MD 08/08/17 1524

## 2017-08-08 NOTE — ED Provider Notes (Signed)
Patient signed out care to follow up CT scan in discharge for outpatient follow-up. CT scan results reviewed, no acute findings however radiology does recommend CT angiogram outpatient for further delineation of findings- splenic varices.  Updated patient on findings.   Kenton KingfisherJoshua M Hillary Schwegler      Dameion Briles, MD 08/08/17 [phone]

## 2018-02-26 ENCOUNTER — Emergency Department (HOSPITAL_COMMUNITY)
Admission: EM | Admit: 2018-02-26 | Discharge: 2018-02-26 | Disposition: A | Discharge status: Home / Self Care | Payer: Self-pay | Attending: Emergency Medicine | Admitting: Emergency Medicine

## 2018-02-26 ENCOUNTER — Encounter (HOSPITAL_COMMUNITY): Payer: Self-pay

## 2018-02-26 ENCOUNTER — Other Ambulatory Visit: Payer: Self-pay

## 2018-02-26 ENCOUNTER — Emergency Department (HOSPITAL_COMMUNITY): Payer: Self-pay

## 2018-02-26 DIAGNOSIS — Z7982 Long term (current) use of aspirin: Secondary | ICD-10-CM | POA: Insufficient documentation

## 2018-02-26 DIAGNOSIS — N10 Acute tubulo-interstitial nephritis: Secondary | ICD-10-CM | POA: Insufficient documentation

## 2018-02-26 DIAGNOSIS — N12 Tubulo-interstitial nephritis, not specified as acute or chronic: Secondary | ICD-10-CM

## 2018-02-26 LAB — CBC WITH DIFFERENTIAL/PLATELET
ABS IMMATURE GRANULOCYTES: 0 10*3/uL (ref 0.0–0.1)
BASOS ABS: 0.1 10*3/uL (ref 0.0–0.1)
BASOS PCT: 1 %
Eosinophils Absolute: 0.1 10*3/uL (ref 0.0–0.7)
Eosinophils Relative: 2 %
HCT: 40.4 % (ref 36.0–46.0)
Hemoglobin: 12.9 g/dL (ref 12.0–15.0)
Immature Granulocytes: 0 %
Lymphocytes Relative: 38 %
Lymphs Abs: 1.6 10*3/uL (ref 0.7–4.0)
MCH: 31.9 pg (ref 26.0–34.0)
MCHC: 31.9 g/dL (ref 30.0–36.0)
MCV: 99.8 fL (ref 78.0–100.0)
Monocytes Absolute: 0.6 10*3/uL (ref 0.1–1.0)
Monocytes Relative: 13 %
NEUTROS ABS: 1.9 10*3/uL (ref 1.7–7.7)
NEUTROS PCT: 46 %
PLATELETS: 144 10*3/uL — AB (ref 150–400)
RBC: 4.05 MIL/uL (ref 3.87–5.11)
RDW: 13.6 % (ref 11.5–15.5)
WBC: 4.2 10*3/uL (ref 4.0–10.5)

## 2018-02-26 LAB — LIPASE, BLOOD: Lipase: 46 U/L (ref 11–51)

## 2018-02-26 LAB — URINALYSIS, ROUTINE W REFLEX MICROSCOPIC
Bilirubin Urine: NEGATIVE
GLUCOSE, UA: NEGATIVE mg/dL
Hgb urine dipstick: NEGATIVE
Ketones, ur: NEGATIVE mg/dL
Nitrite: NEGATIVE
PROTEIN: NEGATIVE mg/dL
SPECIFIC GRAVITY, URINE: 1.016 (ref 1.005–1.030)
pH: 7 (ref 5.0–8.0)

## 2018-02-26 LAB — COMPREHENSIVE METABOLIC PANEL
ALBUMIN: 3.8 g/dL (ref 3.5–5.0)
ALT: 36 U/L (ref 0–44)
AST: 35 U/L (ref 15–41)
Alkaline Phosphatase: 69 U/L (ref 38–126)
Anion gap: 7 (ref 5–15)
BUN: 6 mg/dL (ref 6–20)
CHLORIDE: 108 mmol/L (ref 98–111)
CO2: 24 mmol/L (ref 22–32)
CREATININE: 0.61 mg/dL (ref 0.44–1.00)
Calcium: 8.8 mg/dL — ABNORMAL LOW (ref 8.9–10.3)
GFR calc Af Amer: >60 mL/min (ref >60)
GFR calc non Af Amer: >60 mL/min (ref >60)
Glucose, Bld: 86 mg/dL (ref 70–99)
Potassium: 4.1 mmol/L (ref 3.5–5.1)
SODIUM: 139 mmol/L (ref 135–145)
Total Bilirubin: 0.6 mg/dL (ref 0.3–1.2)
Total Protein: 6.9 g/dL (ref 6.5–8.1)

## 2018-02-26 LAB — POC URINE PREG, ED: Preg Test, Ur: NEGATIVE

## 2018-02-26 MED ORDER — CEPHALEXIN 500 MG PO CAPS: 500.0000 mg | ORAL_CAPSULE | Freq: Three times a day (TID) | ORAL | 0 refills | Status: DC

## 2018-02-26 MED ORDER — SODIUM CHLORIDE 0.9 % IV BOLUS
1000.0000 mL | Freq: Once | INTRAVENOUS | Status: AC
  Administered 2018-02-26: 1000 mL via INTRAVENOUS

## 2018-02-26 MED ORDER — IBUPROFEN 600 MG PO TABS: 600.0000 mg | ORAL_TABLET | Freq: Four times a day (QID) | ORAL | 0 refills | Status: DC | PRN

## 2018-02-26 MED ORDER — SODIUM CHLORIDE 0.9 % IV SOLN
1.0000 g | Freq: Once | INTRAVENOUS | Status: AC
  Administered 2018-02-26: 1 g via INTRAVENOUS
  Filled 2018-02-26: qty 10

## 2018-02-26 MED ORDER — KETOROLAC TROMETHAMINE 30 MG/ML IJ SOLN
30.0000 mg | Freq: Once | INTRAMUSCULAR | Status: AC
  Administered 2018-02-26: 30 mg via INTRAVENOUS
  Filled 2018-02-26: qty 1

## 2018-02-26 NOTE — Discharge Instructions (Signed)
Take keflex three times daily for a week for kidney infection.   Take motrin for pain.   See your doctor  Return to ER if you have worse flank pain, back pain, trouble urinating, fever, vomiting.

## 2018-02-26 NOTE — ED Notes (Signed)
Patient transported to Ultrasound 

## 2018-02-26 NOTE — ED Provider Notes (Signed)
[facility] EMERGENCY DEPARTMENT Provider Note   CSN: [id number] Arrival date & time: 02/26/18  4782     History   Chief Complaint No chief complaint on file.   HPI Christina Gutierrez is a 42 y.o. female here presenting with left flank pain, dysuria.  Patient has acute onset of left flank pain for the last 2 days.  States that the pain is sharp and radiates to her groin.  Also associated with some dysuria and frequency and pain with urination.  Patient denies any nausea vomiting or fevers.  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   The history is provided by the patient. The history is limited by a language barrier. A language interpreter was used.    History reviewed. No pertinent past medical history.  There are no active problems to display for this patient.   Past Surgical History:  Procedure Laterality Date  . CESAREAN SECTION       OB History   None      Home Medications    Prior to Admission medications   Medication Sig Start Date End Date Taking? Authorizing Provider  aspirin EC 325 MG tablet Take 325 mg by mouth as needed for mild pain.    Yes [provider]    Family History No family history on file.  Social History Social History   Tobacco Use  . Smoking status: Never Smoker  . Smokeless tobacco: Never Used  Substance Use Topics  . Alcohol use: No  . Drug use: No     Allergies   Patient has no known allergies.   Review of Systems Review of Systems  Genitourinary: Positive for dysuria, flank pain and frequency.  All other systems reviewed and are negative.    Physical Exam Updated Vital Signs BP (!) 104/54 (BP Location: Right Arm)   Pulse (!) 58   Temp 98.2 F (36.8 C) (Oral)   Resp 17   SpO2 100%   Physical Exam  Constitutional: She is oriented to person, place, and  time. She appears well-developed.  Slightly uncomfortable   HENT:  Head: Normocephalic.  Mouth/Throat: Oropharynx is clear and moist.  Eyes: Pupils are equal, round, and reactive to light. Conjunctivae and EOM are normal.  Neck: Normal range of motion. Neck supple.  Cardiovascular: Normal rate, regular rhythm and normal heart sounds.  Pulmonary/Chest: Effort normal and breath sounds normal. No stridor. No respiratory distress.  Abdominal: Soft. Bowel sounds are normal.  + L CVAT   Musculoskeletal: Normal range of motion.  Neurological: She is alert and oriented to person, place, and time.  Skin: Skin is warm.  Psychiatric: She has a normal mood and affect.  Nursing note and vitals reviewed.    ED Treatments / Results  Labs (all labs ordered are listed, but only abnormal results are displayed) Labs Reviewed  URINALYSIS, ROUTINE W REFLEX MICROSCOPIC - Abnormal; Notable for the following components:      Result Value   APPearance HAZY (*)    Leukocytes, UA LARGE (*)    Bacteria, UA RARE (*)    All other components within normal limits  CBC WITH DIFFERENTIAL/PLATELET - Abnormal; Notable for the following components:   Platelets 144 (*)    All other components within normal limits  COMPREHENSIVE METABOLIC PANEL - Abnormal; Notable for the following components:   Calcium 8.8 (*)  All other components within normal limits  URINE CULTURE  LIPASE, BLOOD  POC URINE PREG, ED    EKG None  Radiology [place] Renal  Result Date: 02/26/2018 CLINICAL DATA:  Left flank pain. EXAM: RENAL / URINARY TRACT ULTRASOUND COMPLETE COMPARISON:  CT abdomen pelvis dated August 08, 2017. FINDINGS: Right Kidney: Length: 10.9 cm. Echogenicity within normal limits. Mild pelviectasis. No mass or hydronephrosis visualized. 8 mm simple cyst arising from the midpole, unchanged. Left Kidney: Length: 12.6 cm. Echogenicity within normal limits. Mild pelviectasis. No mass or hydronephrosis visualized. Bladder:  Appears normal for degree of bladder distention. IMPRESSION: 1. Mild bilateral pelviectasis without frank hydronephrosis. Electronically Signed   By: Obie DredgeWilliam T Derry M.D.   On: 02/26/2018 11:56    Procedures Procedures (including critical care time)  Medications Ordered in ED Medications  sodium chloride 0.9 % bolus 1,000 mL (0 mLs Intravenous Stopped 02/26/18 1145)  ketorolac (TORADOL) 30 MG/ML injection 30 mg (30 mg Intravenous Given 02/26/18 0946)  cefTRIAXone (ROCEPHIN) 1 g in sodium chloride 0.9 % 100 mL IVPB ( Intravenous Stopped 02/26/18 1059)     Initial Impression / Assessment and Plan / ED Course  I have reviewed the triage vital signs and the nursing notes.  Pertinent labs & imaging results that were available during my care of the patient were reviewed by me and considered in my medical decision making (see chart for details).     Christina Gutierrez is a 42 y.o. female here with L flank pain, dysuria. Consider pyelo vs renal colic. Had previous splenic varices but she appears comfortable so won't need CTA currently. Will get labs, US renal, UA.   12:31 PM UA + bacteria and large leuks. WBC nl. US showed no hydro or stones. Pain controlled with toradol. Will dc home with keflex, motrin.   Final Clinical Impressions(s) / ED Diagnoses   Final diagnoses:  None    ED Discharge Orders    None       Charlynne PanderYao, Jezebelle Ledwell Hsienta, MD 02/26/18 1232

## 2018-02-26 NOTE — ED Triage Notes (Signed)
Patient complains of left lower back pain with dysuria for several days. Pain worse with change in position. All information obtained from sratus interpretor. Reports dark urine with same.

## 2018-02-26 NOTE — ED Notes (Signed)
Interpreter at bedside.

## 2018-02-27 LAB — URINE CULTURE: Culture: NO GROWTH

## 2018-09-23 IMAGING — CT CT RENAL STONE PROTOCOL
2 of 4 series · 16 of 46 positions shown, 18 images · non-contrast
Comparison: None.

CLINICAL DATA: Left flank pain.

EXAM:
CT ABDOMEN AND PELVIS WITHOUT CONTRAST
TECHNIQUE: Multidetector CT imaging of the abdomen and pelvis was performed
following the standard protocol without IV contrast.

[Series 3: stone study 5.0 i30f 2 · axial · 0.68mm/px · z∈[+654,+1029]mm · 13 of 83 slices shown, 15 images]
[im 4/83  soft-tissue]
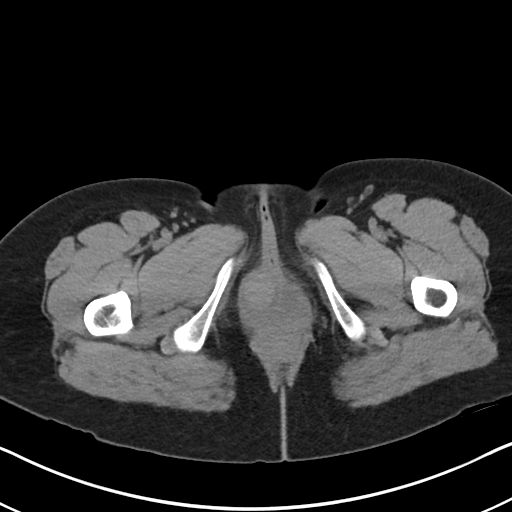
[im 4/83  bone]
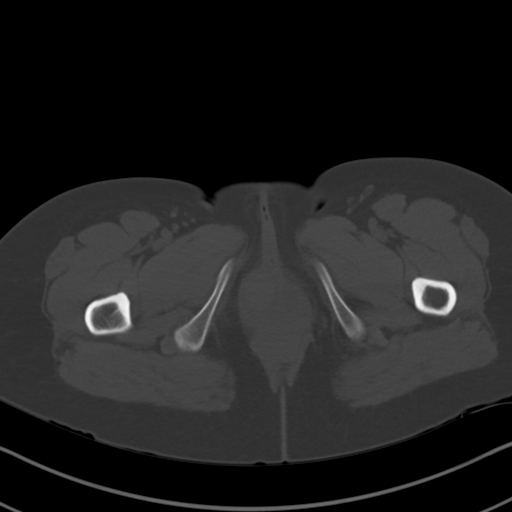
[im 10/83  soft-tissue]
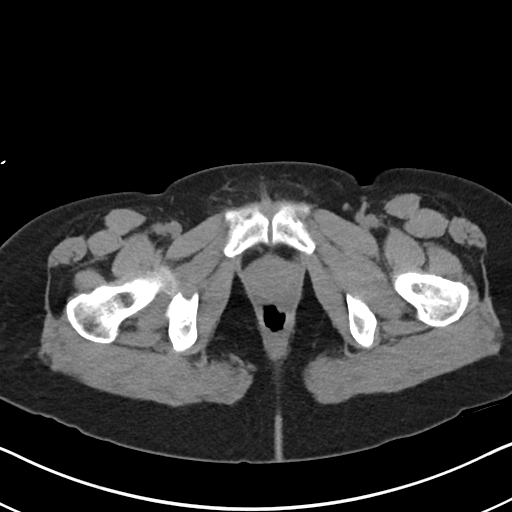
[im 16/83  soft-tissue]
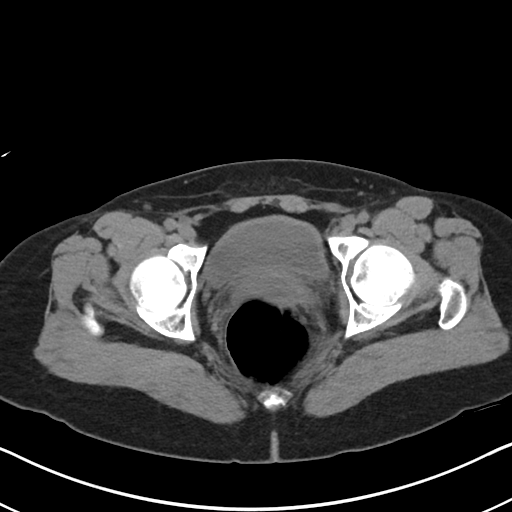
[im 23/83  soft-tissue]
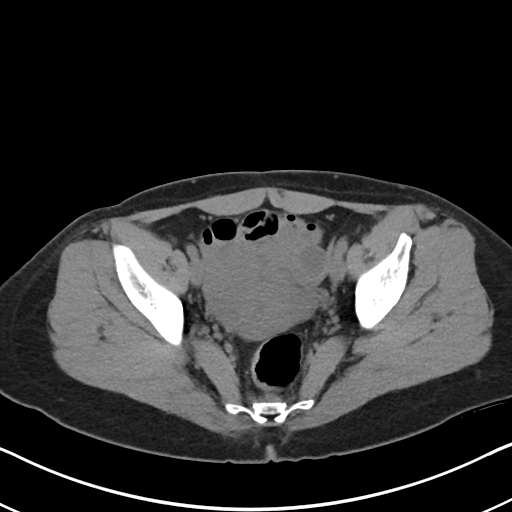
[im 29/83  soft-tissue]
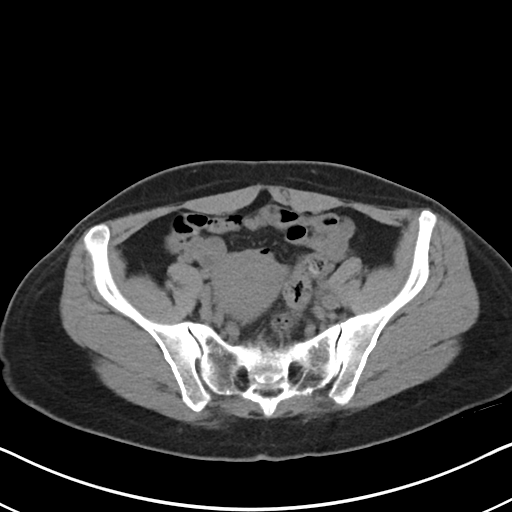
[im 35/83  soft-tissue]
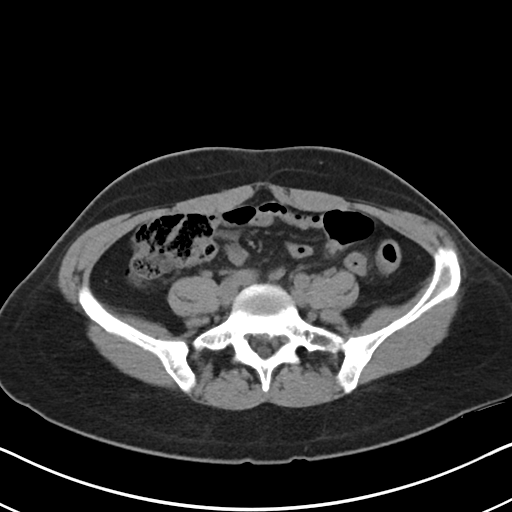
[im 42/83  soft-tissue]
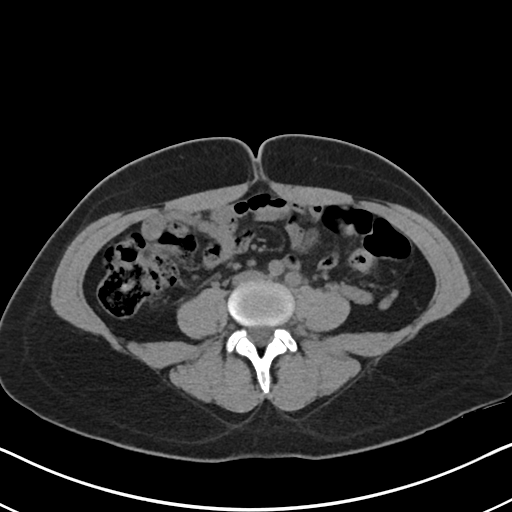
[im 48/83  soft-tissue]
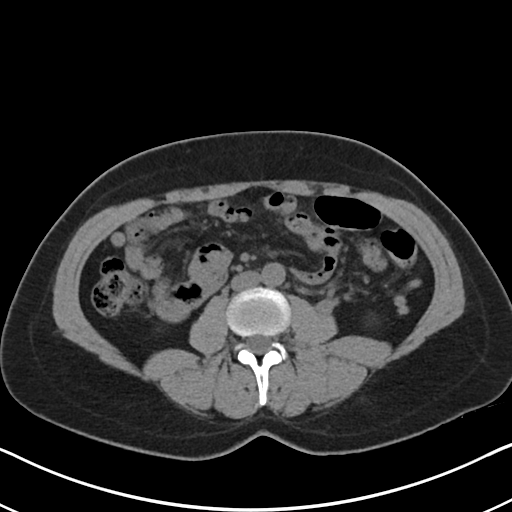
[im 54/83  soft-tissue]
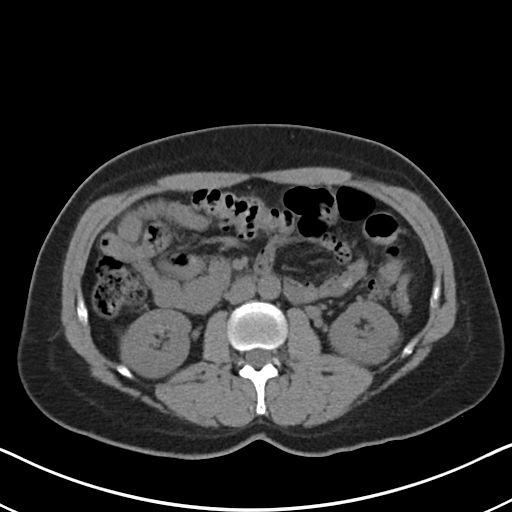
[im 54/83  bone]
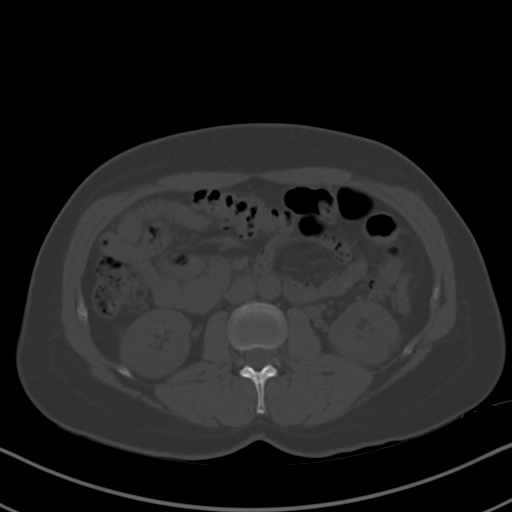
[im 60/83  soft-tissue]
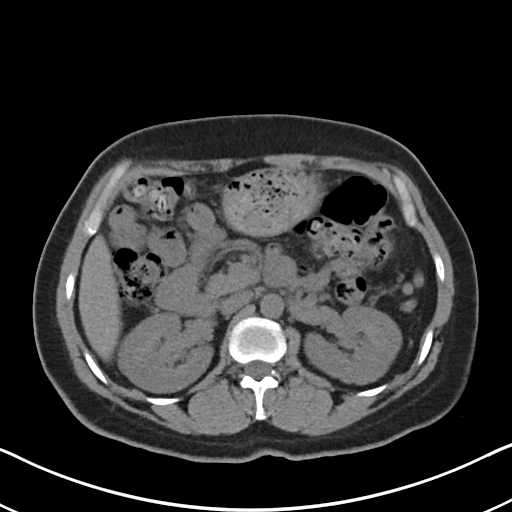
[im 67/83  soft-tissue]
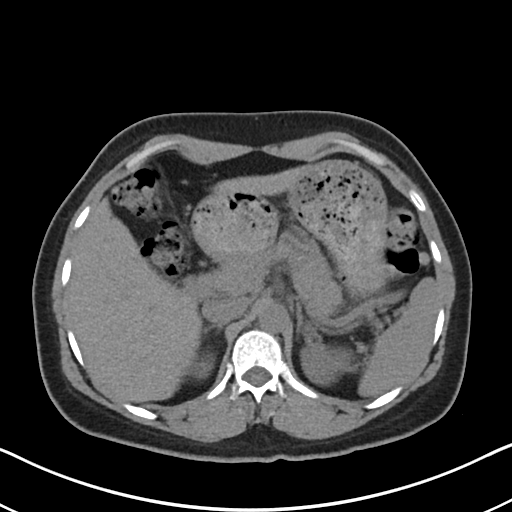
[im 73/83  soft-tissue]
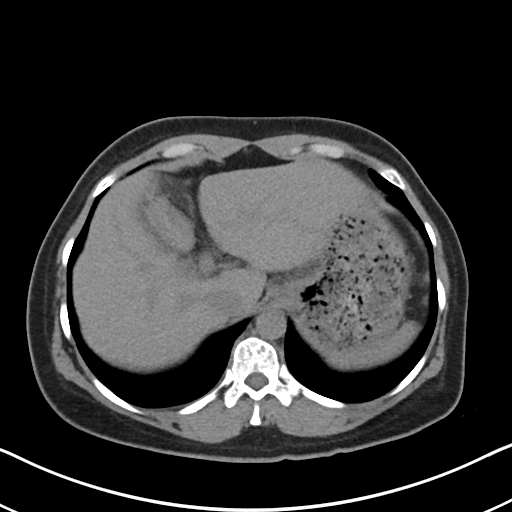
[im 79/83  soft-tissue]
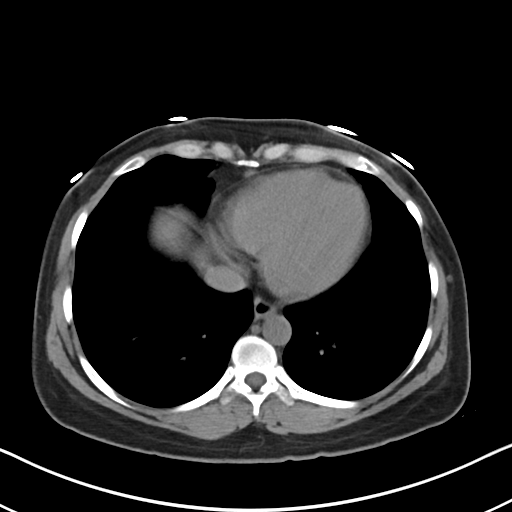

[Series 6: coronal soft tissue · coronal · 0.57mm/px · 3 of 94 slices shown]
[im 32/94  soft-tissue]
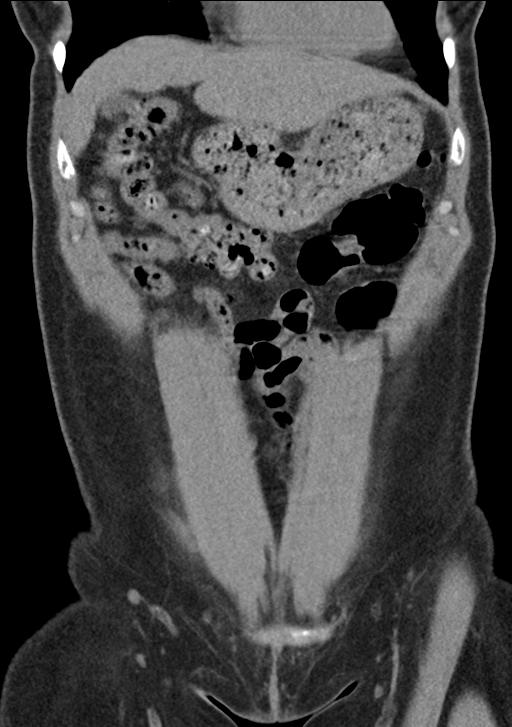
[im 42/94  soft-tissue]
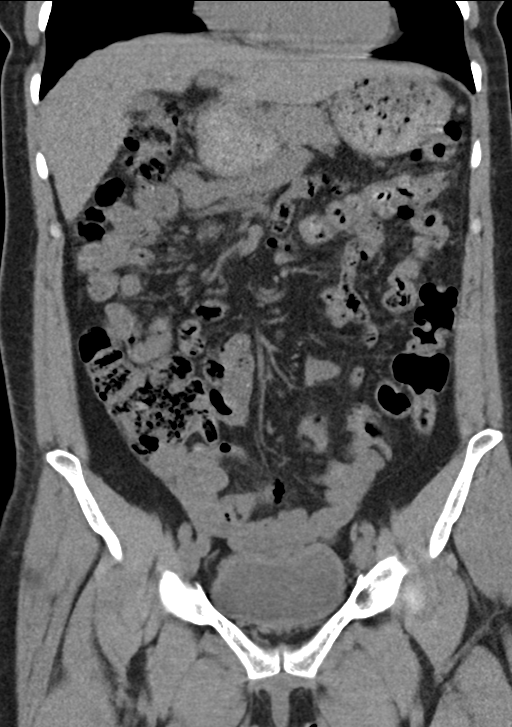
[im 52/94  soft-tissue]
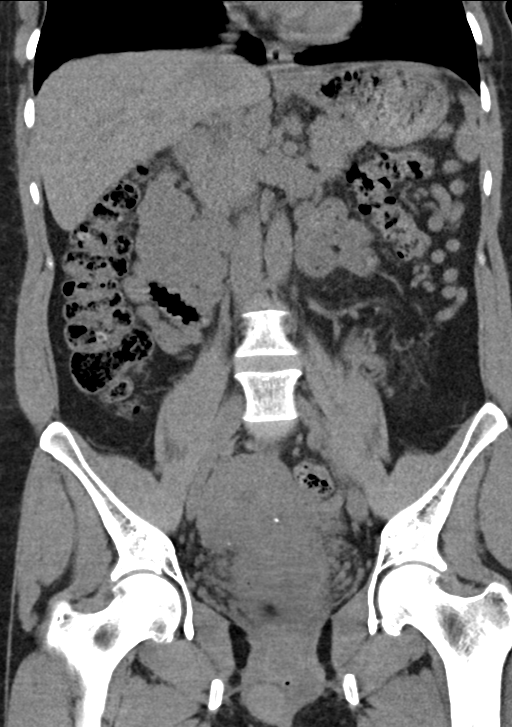

[16 of 46 positions shown; findings below may reference images not displayed]

FINDINGS: Lower chest: No acute abnormality.

Hepatobiliary: No focal liver abnormality is seen. No gallstones,
gallbladder wall thickening, or biliary dilatation.

Pancreas: Unremarkable. No pancreatic ductal dilatation or
surrounding inflammatory changes.

Spleen: Normal in size without focal abnormality.

Adrenals/Urinary Tract: Adrenal glands are unremarkable. Kidneys are
without renal calculi, focal lesion, or hydronephrosis.
Subcentimeter right renal cyst noted. Bladder is unremarkable.

Stomach/Bowel: Stomach is within normal limits. Appendix appears
normal. No evidence of bowel wall thickening, distention, or
inflammatory changes.

Vascular/Lymphatic: No significant vascular findings are present. No
enlarged abdominal or pelvic lymph nodes. Perisplenic and left
pericolic varices, which appears to represent collateral flow
between a diminutive splenic vein and left gonadal vein.

Reproductive: Uterus and bilateral adnexa are unremarkable.

Other: No abdominal wall hernia or abnormality. No abdominopelvic
ascites.

Musculoskeletal: No acute or significant osseous findings.
IMPRESSION: No evidence of obstructive uropathy.

Left infra splenic varices, which appear to represent collateral
flow between a diminutive splenic vein and left gonadal vein.
Evaluation with CT angiogram of the abdomen may be considered to
further characterize this finding.

## 2019-10-22 IMAGING — US US RENAL
1 series · 14 of 25 positions shown · non-contrast
Comparison: CT abdomen pelvis dated August 08, 2017.

CLINICAL DATA: Left flank pain.

EXAM:
RENAL / URINARY TRACT ULTRASOUND COMPLETE

[Series 1: us renal · 0.23mm/px · 14 of 36 slices shown]
[im 1/36]
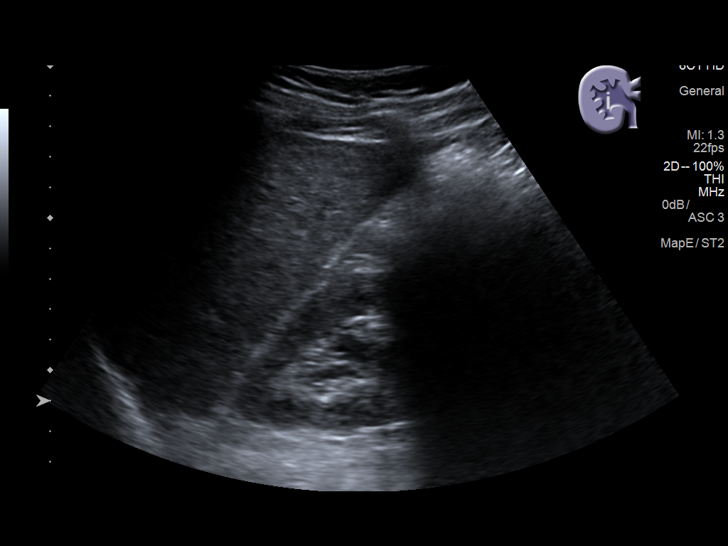
[im 3/36]
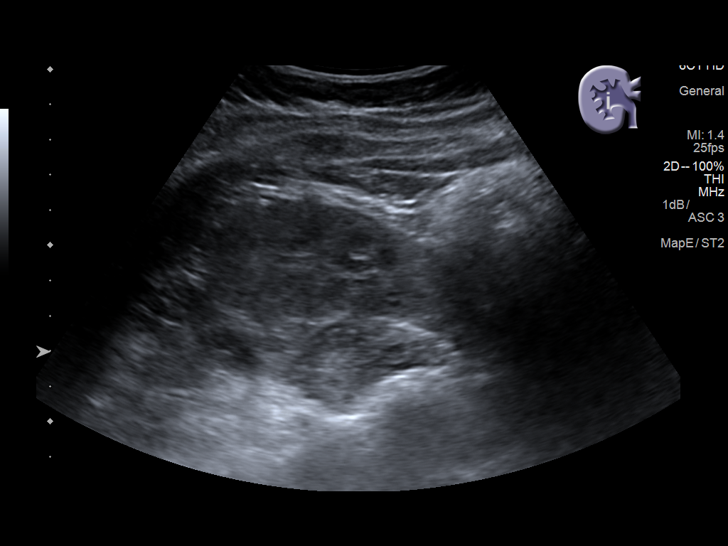
[im 6/36]
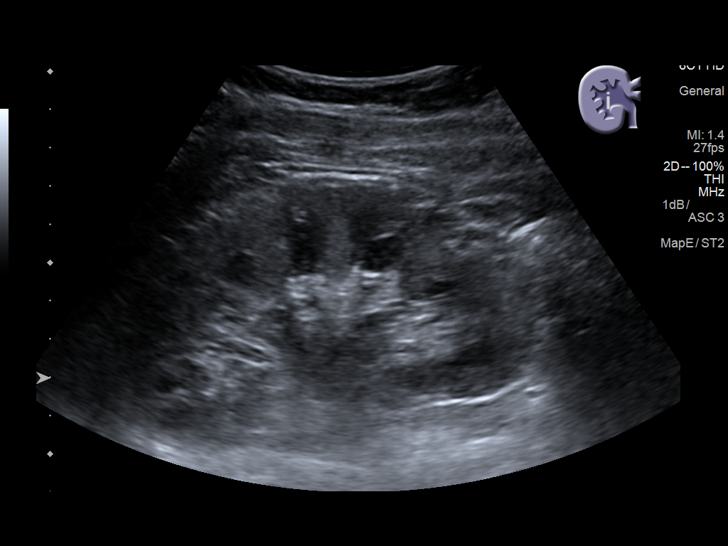
[im 9/36]
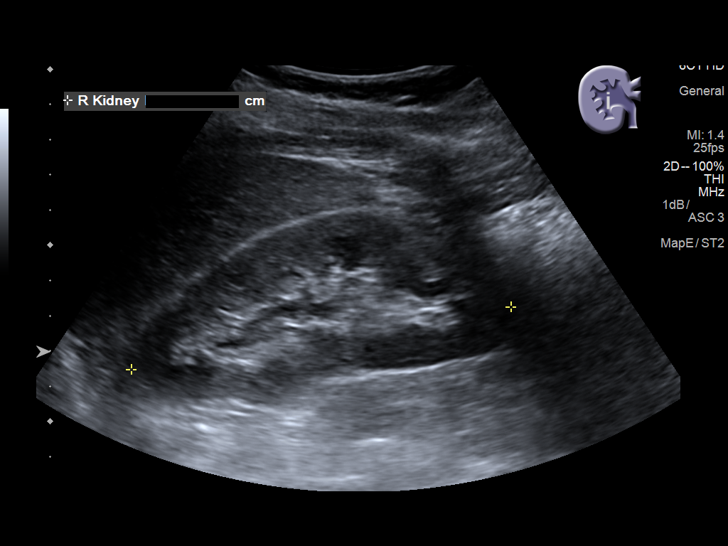
[im 12/36]
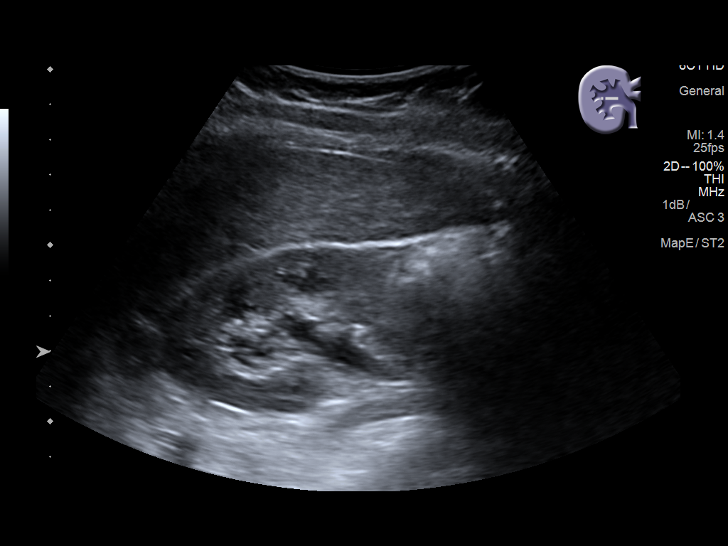
[im 14/36]
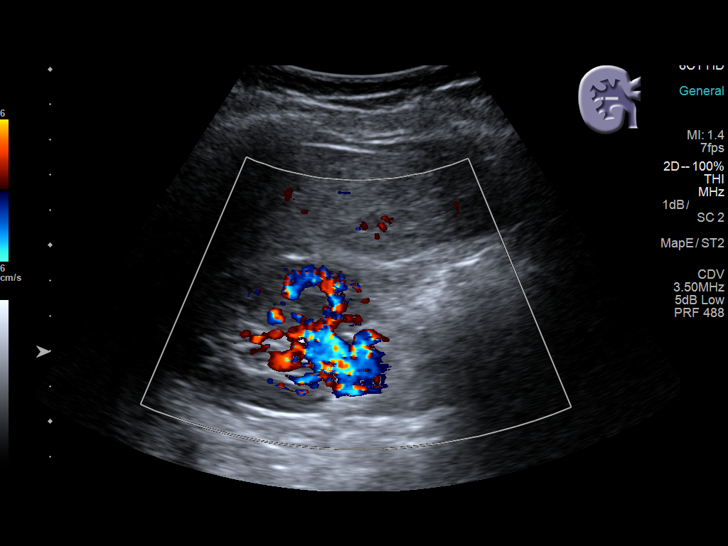
[im 17/36]
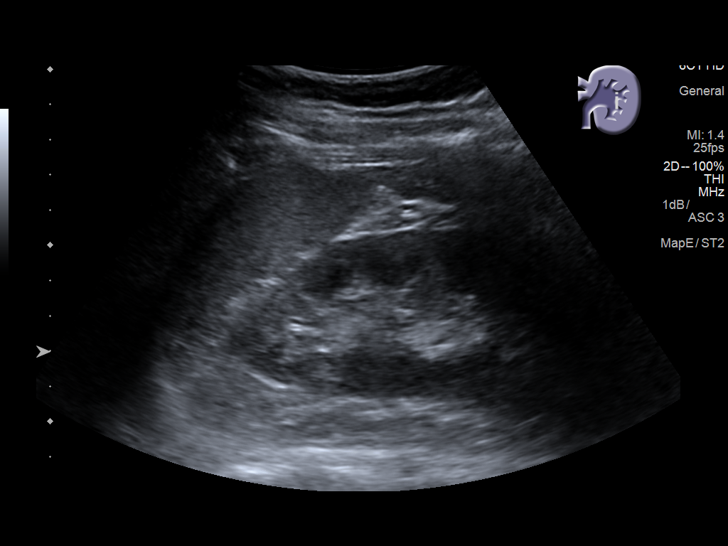
[im 19/36]
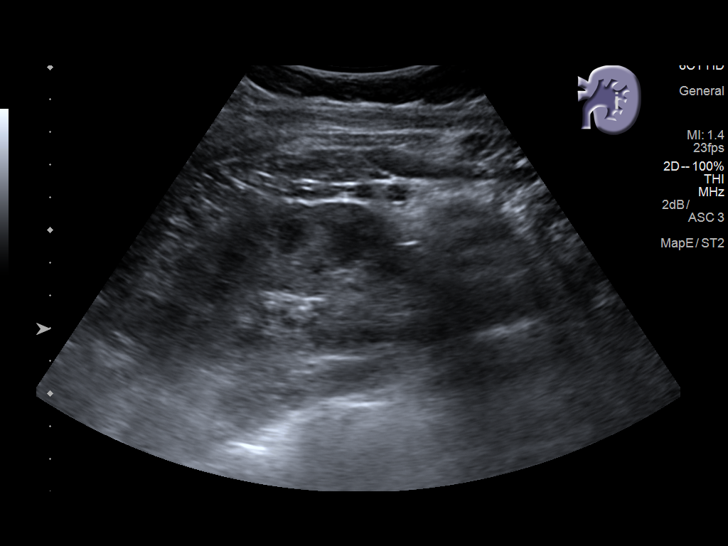
[im 22/36]
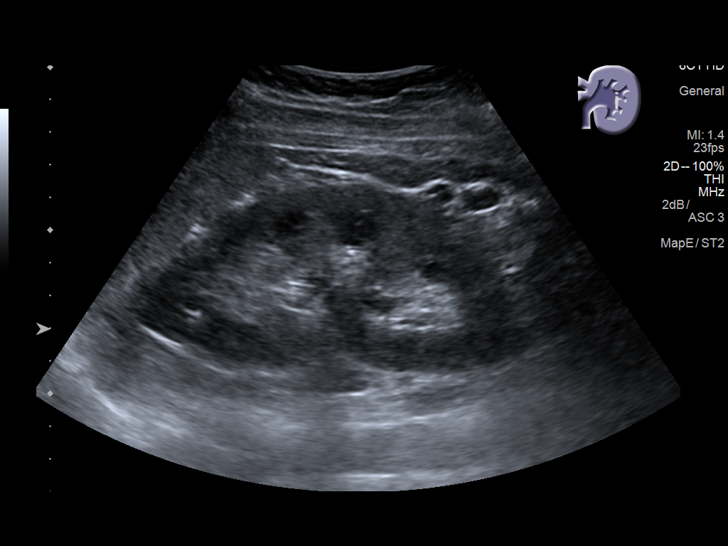
[im 24/36]
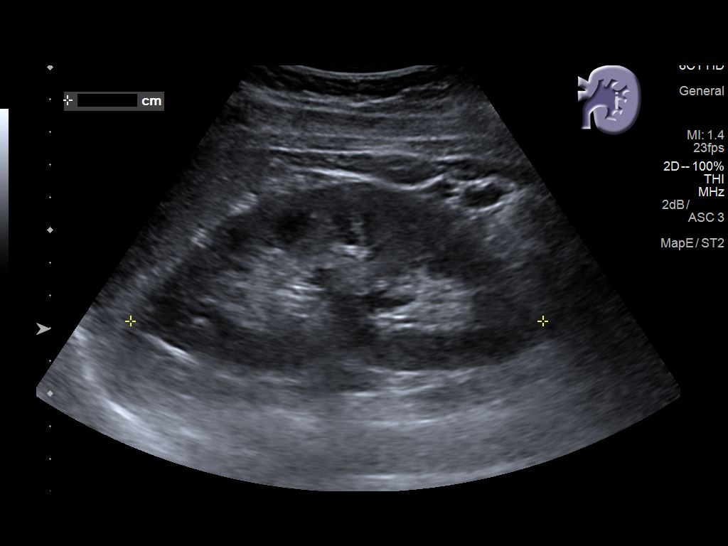
[im 27/36]
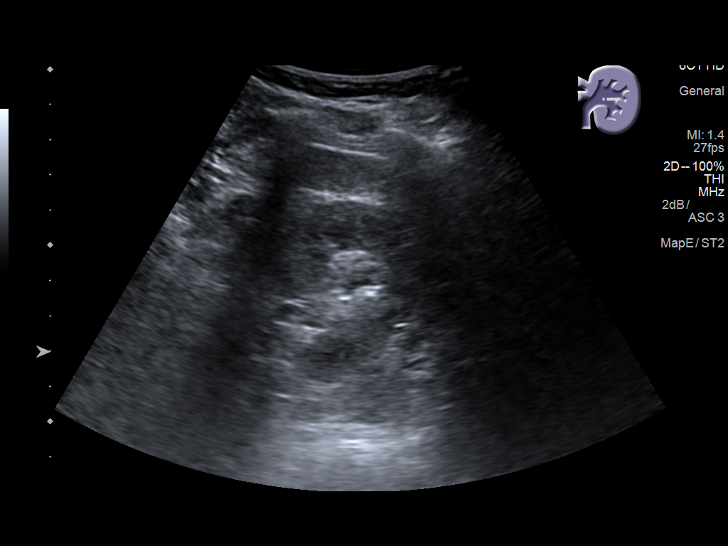
[im 30/36]
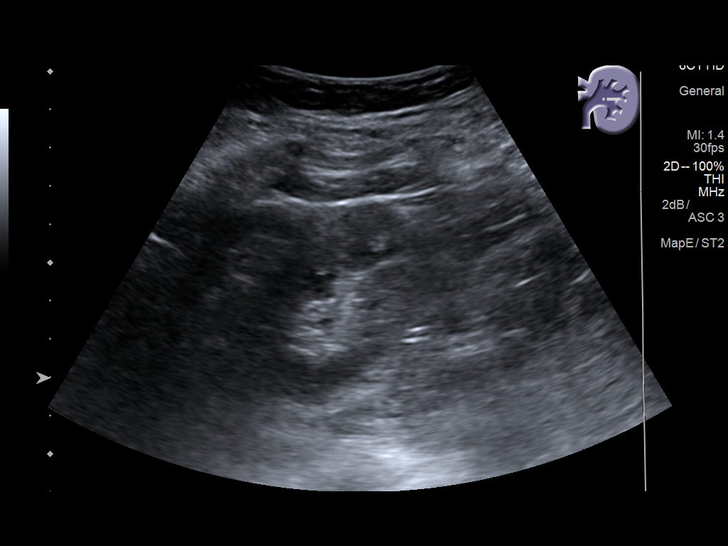
[im 33/36]
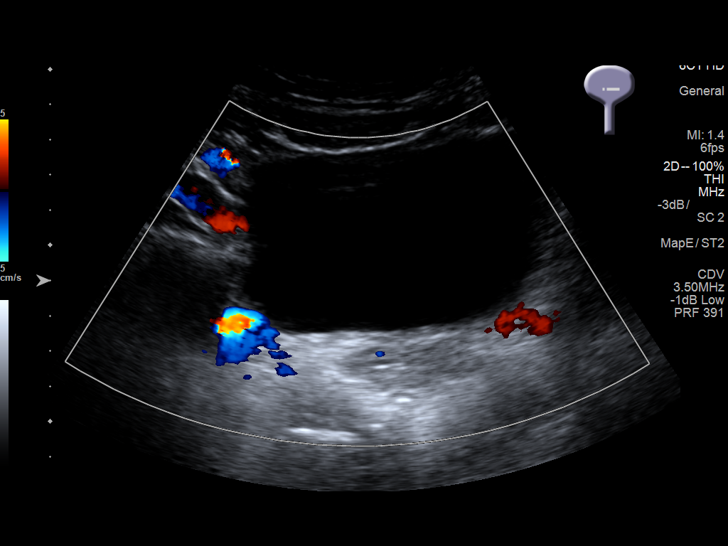
[im 36/36]
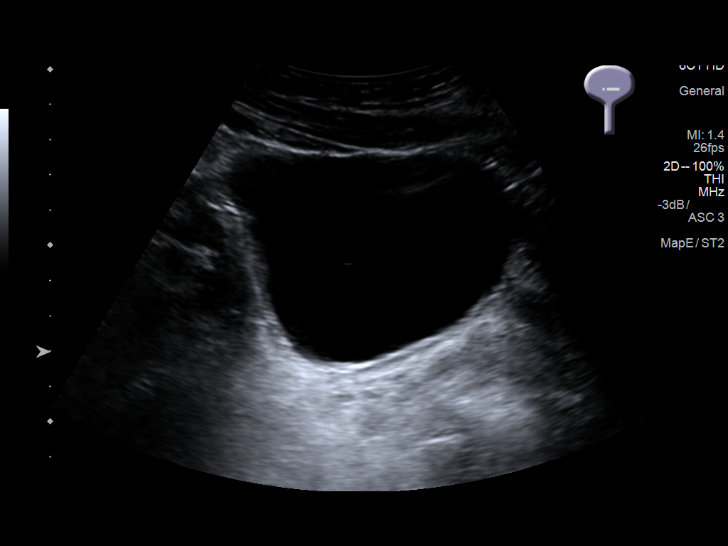

[14 of 25 positions shown; findings below may reference images not displayed]

FINDINGS: Right Kidney:

Length: 10.9 cm. Echogenicity within normal limits. Mild
pelviectasis. No mass or hydronephrosis visualized. 8 mm simple cyst
arising from the midpole, unchanged.

Left Kidney:

Length: 12.6 cm. Echogenicity within normal limits. Mild
pelviectasis. No mass or hydronephrosis visualized.

Bladder:

Appears normal for degree of bladder distention.
IMPRESSION: 1. Mild bilateral pelviectasis without frank hydronephrosis.

## 2022-03-30 ENCOUNTER — Encounter (HOSPITAL_COMMUNITY): Payer: Self-pay | Admitting: Emergency Medicine

## 2022-03-30 ENCOUNTER — Emergency Department (HOSPITAL_COMMUNITY)
Admission: EM | Admit: 2022-03-30 | Discharge: 2022-03-31 | Disposition: A | Discharge status: Home / Self Care | Payer: Self-pay | Attending: Emergency Medicine | Admitting: Emergency Medicine

## 2022-03-30 ENCOUNTER — Other Ambulatory Visit: Payer: Self-pay

## 2022-03-30 DIAGNOSIS — E860 Dehydration: Secondary | ICD-10-CM | POA: Insufficient documentation

## 2022-03-30 DIAGNOSIS — U071 COVID-19: Secondary | ICD-10-CM | POA: Insufficient documentation

## 2022-03-30 LAB — URINALYSIS, ROUTINE W REFLEX MICROSCOPIC
Bilirubin Urine: NEGATIVE
Glucose, UA: NEGATIVE mg/dL
Hgb urine dipstick: NEGATIVE
Ketones, ur: NEGATIVE mg/dL
Leukocytes,Ua: NEGATIVE
Nitrite: NEGATIVE
Protein, ur: NEGATIVE mg/dL
Specific Gravity, Urine: <1.005 — ABNORMAL LOW (ref 1.005–1.030)
pH: 6.5 (ref 5.0–8.0)

## 2022-03-30 NOTE — ED Triage Notes (Signed)
W/ the use of a translator, pt c/o a headache since Monday as well as vomiting, fever, dizziness.  No fevers today

## 2022-03-30 NOTE — ED Provider Triage Note (Signed)
Emergency Medicine Provider Triage Evaluation Note  Christina Gutierrez , a 46 y.o. female  was evaluated in triage.  Pt complains of 5 days of body aches, nausea, vomiting with NBNB emesis, fevers, headache, and decreased appetite.  Poor p.o. tolerance.  Treating with Tylenol with caffeine, Zyrtec with minimal improvement at home. LMP yesterday.   Review of Systems  Positive: As above Negative: Chest pain, syncope, diarrhea  Physical Exam  BP 117/81 (BP Location: Right Arm)   Pulse 72   Temp 98.8 F (37.1 C) (Oral)   Resp 15   SpO2 98%  Gen:   Awake, no distress   Resp:  Normal effort  MSK:   Moves extremities without difficulty  Other:  RRR no M/R/G.  Lungs CTA B.  Mild epigastric tenderness palpation on abdominal exam otherwise benign.  Medical Decision Making  Medically screening exam initiated at 10:48 PM.  Appropriate orders placed.  Christina Gutierrez was informed that the remainder of the evaluation will be completed by another provider, this initial triage assessment does not replace that evaluation, and the importance of remaining in the ED until their evaluation is complete.  Work-up initiated. This chart was dictated using voice recognition software, Dragon. Despite the best efforts of this provider to proofread and correct errors, errors may still occur which can change documentation meaning.    Emeline Darling, PA-C 03/30/22 2249

## 2022-03-31 LAB — CBC WITH DIFFERENTIAL/PLATELET
Abs Immature Granulocytes: 0.01 10*3/uL (ref 0.00–0.07)
Basophils Absolute: 0 10*3/uL (ref 0.0–0.1)
Basophils Relative: 1 %
Eosinophils Absolute: 0 10*3/uL (ref 0.0–0.5)
Eosinophils Relative: 1 %
HCT: 38.9 % (ref 36.0–46.0)
Hemoglobin: 12.9 g/dL (ref 12.0–15.0)
Immature Granulocytes: 0 %
Lymphocytes Relative: 57 %
Lymphs Abs: 1.9 10*3/uL (ref 0.7–4.0)
MCH: 32.5 pg (ref 26.0–34.0)
MCHC: 33.2 g/dL (ref 30.0–36.0)
MCV: 98 fL (ref 80.0–100.0)
Monocytes Absolute: 0.6 10*3/uL (ref 0.1–1.0)
Monocytes Relative: 18 %
Neutro Abs: 0.8 10*3/uL — ABNORMAL LOW (ref 1.7–7.7)
Neutrophils Relative %: 23 %
Platelets: 173 10*3/uL (ref 150–400)
RBC: 3.97 MIL/uL (ref 3.87–5.11)
RDW: 14.6 % (ref 11.5–15.5)
WBC: 3.3 10*3/uL — ABNORMAL LOW (ref 4.0–10.5)
nRBC: 0 % (ref 0.0–0.2)

## 2022-03-31 LAB — RESP PANEL BY RT-PCR (FLU A&B, COVID) ARPGX2
Influenza A by PCR: NEGATIVE
Influenza B by PCR: NEGATIVE
SARS Coronavirus 2 by RT PCR: POSITIVE — AB

## 2022-03-31 LAB — COMPREHENSIVE METABOLIC PANEL
ALT: 69 U/L — ABNORMAL HIGH (ref 0–44)
AST: 101 U/L — ABNORMAL HIGH (ref 15–41)
Albumin: 3.7 g/dL (ref 3.5–5.0)
Alkaline Phosphatase: 72 U/L (ref 38–126)
Anion gap: 11 (ref 5–15)
BUN: <5 mg/dL — ABNORMAL LOW (ref 6–20)
CO2: 19 mmol/L — ABNORMAL LOW (ref 22–32)
Calcium: 8.9 mg/dL (ref 8.9–10.3)
Chloride: 107 mmol/L (ref 98–111)
Creatinine, Ser: 0.64 mg/dL (ref 0.44–1.00)
GFR, Estimated: >60 mL/min (ref >60)
Glucose, Bld: 103 mg/dL — ABNORMAL HIGH (ref 70–99)
Potassium: 4 mmol/L (ref 3.5–5.1)
Sodium: 137 mmol/L (ref 135–145)
Total Bilirubin: 0.5 mg/dL (ref 0.3–1.2)
Total Protein: 7.7 g/dL (ref 6.5–8.1)

## 2022-03-31 LAB — I-STAT BETA HCG BLOOD, ED (MC, WL, AP ONLY): I-stat hCG, quantitative: <5 m[IU]/mL (ref <5)

## 2022-03-31 LAB — LIPASE, BLOOD: Lipase: 46 U/L (ref 11–51)

## 2022-03-31 MED ORDER — SODIUM CHLORIDE 0.9 % IV BOLUS
1000.0000 mL | Freq: Once | INTRAVENOUS | Status: AC
  Administered 2022-03-31: 1000 mL via INTRAVENOUS

## 2022-03-31 MED ORDER — ONDANSETRON 4 MG PO TBDP: 4.0000 mg | ORAL_TABLET | Freq: Three times a day (TID) | ORAL | 0 refills | Status: DC | PRN

## 2022-03-31 MED ORDER — ONDANSETRON HCL 4 MG/2ML IJ SOLN
4.0000 mg | Freq: Once | INTRAMUSCULAR | Status: AC
  Administered 2022-03-31: 4 mg via INTRAVENOUS
  Filled 2022-03-31: qty 2

## 2022-03-31 NOTE — ED Notes (Signed)
Discharge instructions reviewed with patient. Patient denies any questions or concerns. Patient ambulatory out of ED. 

## 2022-03-31 NOTE — ED Provider Notes (Signed)
[facility] EMERGENCY DEPARTMENT Provider Note   CSN: [id number] Arrival date & time: 03/30/22  2045     History {Add pertinent medical, surgical, social history, OB history to HPI:1} Chief Complaint  Patient presents with   Fever    Headache     Christina Gutierrez is a 46 y.o. female.  The history is provided by the patient.  Fever Patient presents feeling bad.  Has had for around 6 days.  Nausea vomiting no diarrhea.  Decreased oral intake.  States she feels dizzy when she stands up.  Also has a headache.  Patient speak Spanish and Education administrator was used.  States she feels a little achy all over no specific abdominal pain.     Home Medications Prior to Admission medications   Medication Sig Start Date End Date Taking? Authorizing Provider  aspirin EC 325 MG tablet Take 325 mg by mouth as needed for mild pain.     [provider]  cephALEXin (KEFLEX) 500 MG capsule Take 1 capsule (500 mg total) by mouth 3 (three) times daily. 02/26/18   Charlynne Pander, MD  ibuprofen (ADVIL,MOTRIN) 600 MG tablet Take 1 tablet (600 mg total) by mouth every 6 (six) hours as needed. 02/26/18   Charlynne Pander, MD      Allergies    Patient has no known allergies.    Review of Systems   Review of Systems  Constitutional:  Positive for fever.    Physical Exam Updated Vital Signs BP 120/85 (BP Location: Right Arm)   Pulse 82   Temp 99 F (37.2 C) (Oral)   Resp 17   SpO2 100%  Physical Exam Vitals and nursing note reviewed.  Eyes:     Extraocular Movements: Extraocular movements intact.  Pulmonary:     Breath sounds: No stridor. No wheezing.  Abdominal:     Tenderness: There is no abdominal tenderness.  Musculoskeletal:        General: No tenderness.     Cervical back: Neck supple.  Skin:    General: Skin is warm.     Capillary Refill: Capillary refill takes less than 2 seconds.  Neurological:     Mental Status: She is alert and  oriented to person, place, and time.     ED Results / Procedures / Treatments   Labs (all labs ordered are listed, but only abnormal results are displayed) Labs Reviewed  RESP PANEL BY RT-PCR (FLU A&B, COVID) ARPGX2 - Abnormal; Notable for the following components:      Result Value   SARS Coronavirus 2 by RT PCR POSITIVE (*)    All other components within normal limits  CBC WITH DIFFERENTIAL/PLATELET - Abnormal; Notable for the following components:   WBC 3.3 (*)    Neutro Abs 0.8 (*)    All other components within normal limits  COMPREHENSIVE METABOLIC PANEL - Abnormal; Notable for the following components:   CO2 19 (*)    Glucose, Bld 103 (*)    BUN <5 (*)    AST 101 (*)    ALT 69 (*)    All other components within normal limits  URINALYSIS, ROUTINE W REFLEX MICROSCOPIC - Abnormal; Notable for the following components:   Color, Urine STRAW (*)    Specific Gravity, Urine <1.005 (*)    All other components within normal limits  LIPASE, BLOOD  I-STAT BETA HCG BLOOD, ED ([facility], [facility], [facility] ONLY)    EKG None  Radiology No results found.  Procedures  Procedures  {Document cardiac monitor, telemetry assessment procedure when appropriate:1}  Medications Ordered in ED Medications  sodium chloride 0.9 % bolus 1,000 mL (has no administration in time range)  ondansetron (ZOFRAN) injection 4 mg (has no administration in time range)    ED Course/ Medical Decision Making/ A&P                           Medical Decision Making Risk Prescription drug management.   Patient presents with dizziness fevers decreased oral intake nausea and vomiting.  Sounds of the dizziness is more lightheadedness/near syncope.  Worse with standing.  Has had nausea vomiting and decreased oral intake.  Blood work overall reassuring with white count mildly elevated.  \LFTs mildly elevated but I think secondary to the COVID test that came back positive.  Finger-nose intact and eye movements intact.  We will  give fluid bolus and Zofran.  Hopefully should be able to discharge home.  {Document critical care time when appropriate:1} {Document review of labs and clinical decision tools ie heart score, Chads2Vasc2 etc:1}  {Document your independent review of radiology images, and any outside records:1} {Document your discussion with family members, caretakers, and with consultants:1} {Document social determinants of health affecting pt's care:1} {Document your decision making why or why not admission, treatments were needed:1} Final Clinical Impression(s) / ED Diagnoses Final diagnoses:  None    Rx / DC Orders ED Discharge Orders     None

## 2022-07-08 ENCOUNTER — Encounter (HOSPITAL_COMMUNITY): Payer: Self-pay | Admitting: Emergency Medicine

## 2022-07-08 ENCOUNTER — Emergency Department (HOSPITAL_COMMUNITY)
Admission: EM | Admit: 2022-07-08 | Discharge: 2022-07-08 | Disposition: A | Discharge status: Home / Self Care | Payer: Self-pay | Attending: Emergency Medicine | Admitting: Emergency Medicine

## 2022-07-08 ENCOUNTER — Other Ambulatory Visit: Payer: Self-pay

## 2022-07-08 ENCOUNTER — Emergency Department (HOSPITAL_COMMUNITY): Payer: Self-pay

## 2022-07-08 DIAGNOSIS — R5383 Other fatigue: Secondary | ICD-10-CM | POA: Insufficient documentation

## 2022-07-08 DIAGNOSIS — Z1152 Encounter for screening for COVID-19: Secondary | ICD-10-CM | POA: Insufficient documentation

## 2022-07-08 DIAGNOSIS — R42 Dizziness and giddiness: Secondary | ICD-10-CM | POA: Insufficient documentation

## 2022-07-08 DIAGNOSIS — Z7982 Long term (current) use of aspirin: Secondary | ICD-10-CM | POA: Insufficient documentation

## 2022-07-08 LAB — URINALYSIS, ROUTINE W REFLEX MICROSCOPIC
Bacteria, UA: NONE SEEN
Bilirubin Urine: NEGATIVE
Glucose, UA: NEGATIVE mg/dL
Ketones, ur: NEGATIVE mg/dL
Leukocytes,Ua: NEGATIVE
Nitrite: NEGATIVE
Protein, ur: NEGATIVE mg/dL
Specific Gravity, Urine: 1.013 (ref 1.005–1.030)
pH: 6 (ref 5.0–8.0)

## 2022-07-08 LAB — CBC WITH DIFFERENTIAL/PLATELET
Abs Immature Granulocytes: 0.02 10*3/uL (ref 0.00–0.07)
Basophils Absolute: 0.1 10*3/uL (ref 0.0–0.1)
Basophils Relative: 2 %
Eosinophils Absolute: 0.2 10*3/uL (ref 0.0–0.5)
Eosinophils Relative: 4 %
HCT: 36.2 % (ref 36.0–46.0)
Hemoglobin: 11.7 g/dL — ABNORMAL LOW (ref 12.0–15.0)
Immature Granulocytes: 0 %
Lymphocytes Relative: 34 %
Lymphs Abs: 1.8 10*3/uL (ref 0.7–4.0)
MCH: 32.1 pg (ref 26.0–34.0)
MCHC: 32.3 g/dL (ref 30.0–36.0)
MCV: 99.5 fL (ref 80.0–100.0)
Monocytes Absolute: 0.5 10*3/uL (ref 0.1–1.0)
Monocytes Relative: 10 %
Neutro Abs: 2.7 10*3/uL (ref 1.7–7.7)
Neutrophils Relative %: 50 %
Platelets: 194 10*3/uL (ref 150–400)
RBC: 3.64 MIL/uL — ABNORMAL LOW (ref 3.87–5.11)
RDW: 14.4 % (ref 11.5–15.5)
WBC: 5.4 10*3/uL (ref 4.0–10.5)
nRBC: 0 % (ref 0.0–0.2)

## 2022-07-08 LAB — COMPREHENSIVE METABOLIC PANEL
ALT: 76 U/L — ABNORMAL HIGH (ref 0–44)
AST: 88 U/L — ABNORMAL HIGH (ref 15–41)
Albumin: 3.5 g/dL (ref 3.5–5.0)
Alkaline Phosphatase: 78 U/L (ref 38–126)
Anion gap: 6 (ref 5–15)
BUN: 5 mg/dL — ABNORMAL LOW (ref 6–20)
CO2: 24 mmol/L (ref 22–32)
Calcium: 8.6 mg/dL — ABNORMAL LOW (ref 8.9–10.3)
Chloride: 105 mmol/L (ref 98–111)
Creatinine, Ser: 0.63 mg/dL (ref 0.44–1.00)
GFR, Estimated: >60 mL/min (ref >60)
Glucose, Bld: 95 mg/dL (ref 70–99)
Potassium: 3.9 mmol/L (ref 3.5–5.1)
Sodium: 135 mmol/L (ref 135–145)
Total Bilirubin: 0.4 mg/dL (ref 0.3–1.2)
Total Protein: 7.3 g/dL (ref 6.5–8.1)

## 2022-07-08 LAB — LIPASE, BLOOD: Lipase: 60 U/L — ABNORMAL HIGH (ref 11–51)

## 2022-07-08 LAB — I-STAT BETA HCG BLOOD, ED (MC, WL, AP ONLY): I-stat hCG, quantitative: <5 m[IU]/mL (ref <5)

## 2022-07-08 LAB — RESP PANEL BY RT-PCR (RSV, FLU A&B, COVID)  RVPGX2
Influenza A by PCR: NEGATIVE
Influenza B by PCR: NEGATIVE
Resp Syncytial Virus by PCR: NEGATIVE
SARS Coronavirus 2 by RT PCR: NEGATIVE

## 2022-07-08 MED ORDER — MECLIZINE HCL 25 MG PO TABS
25.0000 mg | ORAL_TABLET | Freq: Once | ORAL | Status: AC
  Administered 2022-07-08: 25 mg via ORAL
  Filled 2022-07-08: qty 1

## 2022-07-08 MED ORDER — MECLIZINE HCL 25 MG PO TABS: 25.0000 mg | ORAL_TABLET | Freq: Three times a day (TID) | ORAL | 0 refills | Status: DC | PRN

## 2022-07-08 NOTE — ED Notes (Signed)
Discharge instructions reviewed with patient. Patient denies any questions or concerns. Patient and son deny any questions or concerns at this time. Patient ambulatory out of ED.

## 2022-07-08 NOTE — ED Provider Notes (Signed)
[facility] EMERGENCY DEPARTMENT Provider Note  CSN: [id number] Arrival date & time: 07/08/22 0259  Chief Complaint(s) Dizziness and Fatigue  HPI Christina Gutierrez is a 47 y.o. female with past medical history as below, significant for prior COVID infection, spanish speaking, does not follow with pcp who presents to the ED with complaint of body aches, dizziness. Translator was used as pt is spanish speaking. She reports over the past 6 mos or so she has been having intermittent dizziness/room spinning sensation. She feels these symptoms began after she contracted covid 19 around that same time. She has also been receiving some sort of vitamin injection from [place] over the last few months. She does not follow with a pcp. She denies and vision changes , nausea or vomiting, no numbness or tingling. Does report a head injury around 3 years ago when a nailgun fell on her head and she had brief LOC but o/w no recent trauma. Symptoms provoked by ambulation or head turning, improved w/ rest, sitting still. She has been taking voltaren intermittently for the body aches.   Past Medical History History reviewed. No pertinent past medical history. There are no problems to display for this patient.  Home Medication(s) Prior to Admission medications   Medication Sig Start Date End Date Taking? Authorizing Provider  meclizine (ANTIVERT) 25 MG tablet Take 1 tablet (25 mg total) by mouth 3 (three) times daily as needed for dizziness. 07/08/22  Yes Sloan Leiter, DO  aspirin EC 325 MG tablet Take 325 mg by mouth as needed for mild pain.     [provider]  cephALEXin (KEFLEX) 500 MG capsule Take 1 capsule (500 mg total) by mouth 3 (three) times daily. 02/26/18   Charlynne Pander, MD  ibuprofen (ADVIL,MOTRIN) 600 MG tablet Take 1 tablet (600 mg total) by mouth every 6 (six) hours as needed. 02/26/18   Charlynne Pander, MD  ondansetron (ZOFRAN-ODT) 4 MG disintegrating tablet  Take 1 tablet (4 mg total) by mouth every 8 (eight) hours as needed for nausea or vomiting. 03/31/22   Benjiman Core, MD                                                                                                                                    Past Surgical History Past Surgical History:  Procedure Laterality Date   CESAREAN SECTION     Family History History reviewed. No pertinent family history.  Social History Social History   Tobacco Use   Smoking status: Never   Smokeless tobacco: Never  Substance Use Topics   Alcohol use: No   Drug use: No   Allergies Patient has no known allergies.  Review of Systems Review of Systems  Constitutional:  Positive for fatigue. Negative for chills and fever.  HENT:  Negative for facial swelling and trouble swallowing.   Eyes:  Negative for photophobia and visual disturbance.  Respiratory:  Negative  for cough and shortness of breath.   Cardiovascular:  Negative for chest pain and palpitations.  Gastrointestinal:  Negative for abdominal pain, nausea and vomiting.  Endocrine: Negative for polydipsia and polyuria.  Genitourinary:  Negative for difficulty urinating and hematuria.  Musculoskeletal:  Positive for arthralgias. Negative for gait problem and joint swelling.  Skin:  Negative for pallor and rash.  Neurological:  Positive for dizziness. Negative for syncope and headaches.  Psychiatric/Behavioral:  Negative for agitation and confusion.     Physical Exam Vital Signs  I have reviewed the triage vital signs BP 109/83 (BP Location: Left Arm)   Pulse 74   Temp 98.3 F (36.8 C) (Oral)   Resp 17   Ht 5' (1.524 m)   Wt 68 kg   SpO2 100%   BMI 29.28 kg/m  Physical Exam Vitals and nursing note reviewed.  Constitutional:      General: She is not in acute distress.    Appearance: Normal appearance.  HENT:     Head: Normocephalic and atraumatic. No right periorbital erythema or left periorbital erythema.     Jaw: There  is normal jaw occlusion.     Right Ear: External ear normal.     Left Ear: External ear normal.     Nose: Nose normal.     Mouth/Throat:     Mouth: Mucous membranes are moist.  Eyes:     General: No scleral icterus.       Right eye: No discharge.        Left eye: No discharge.     Extraocular Movements: Extraocular movements intact.     Pupils: Pupils are equal, round, and reactive to light.  Cardiovascular:     Rate and Rhythm: Normal rate and regular rhythm.     Pulses: Normal pulses.     Heart sounds: Normal heart sounds.  Pulmonary:     Effort: Pulmonary effort is normal. No respiratory distress.     Breath sounds: Normal breath sounds.  Abdominal:     General: Abdomen is flat.     Tenderness: There is no abdominal tenderness.  Musculoskeletal:        General: Normal range of motion.     Cervical back: Normal range of motion. No rigidity.     Right lower leg: No edema.     Left lower leg: No edema.  Skin:    General: Skin is warm and dry.     Capillary Refill: Capillary refill takes less than 2 seconds.  Neurological:     Mental Status: She is alert and oriented to person, place, and time.     GCS: GCS eye subscore is 4. GCS verbal subscore is 5. GCS motor subscore is 6.     Cranial Nerves: Cranial nerves 2-12 are intact.     Sensory: Sensation is intact.     Motor: Motor function is intact.     Coordination: Coordination is intact.     Gait: Gait is intact.     Comments: Strength 5/5 BLUE BLLE NVI BLLE BLUE No nystagmus   Psychiatric:        Mood and Affect: Mood normal.        Behavior: Behavior normal.     ED Results and Treatments Labs (all labs ordered are listed, but only abnormal results are displayed) Labs Reviewed  CBC WITH DIFFERENTIAL/PLATELET - Abnormal; Notable for the following components:      Result Value   RBC 3.64 (*)    Hemoglobin  11.7 (*)    All other components within normal limits  COMPREHENSIVE METABOLIC PANEL - Abnormal; Notable for  the following components:   BUN 5 (*)    Calcium 8.6 (*)    AST 88 (*)    ALT 76 (*)    All other components within normal limits  URINALYSIS, ROUTINE W REFLEX MICROSCOPIC - Abnormal; Notable for the following components:   APPearance HAZY (*)    Hgb urine dipstick SMALL (*)    All other components within normal limits  LIPASE, BLOOD - Abnormal; Notable for the following components:   Lipase 60 (*)    All other components within normal limits  RESP PANEL BY RT-PCR (RSV, FLU A&B, COVID)  RVPGX2  I-STAT BETA HCG BLOOD, ED ([facility], [facility], [facility] ONLY)                                                                                                                          Radiology No results found.  Pertinent labs & imaging results that were available during my care of the patient were reviewed by me and considered in my medical decision making (see MDM for details).  Medications Ordered in ED Medications  meclizine (ANTIVERT) tablet 25 mg (25 mg Oral Given 07/08/22 1320)                                                                                                                                     Procedures Procedures  (including critical care time)  Medical Decision Making / ED Course   MDM:  Ayrionna Sholtis is a 47 y.o. female with past medical history as below, significant for prior COVID infection, spanish speaking, does not follow with pcp who presents to the ED with complaint of body aches, dizziness. . The complaint involves an extensive differential diagnosis and also carries with it a high risk of complications and morbidity.  Serious etiology was considered. Ddx includes but is not limited to: bppv, central vertigo, dehydration, concussion/post concussion, ich, etc  On initial assessment the patient is: resting comfortably, sitting up on stretcher, nad Vital signs and nursing notes were reviewed     Labs reviewed and were stable, CTH was reviewed and is stable.    Symptoms resolved after antivert  Neuro exam is non-focal, gait steady, tolerating PO   Patient presents with vertigo. On initial evaluation patient appears in no acute  distress, afebrile with normal vital signs. Vertigo most suggestive of peripheral cause. Neuro intact without sign of CNS ischemia or other serious etiology. DC on Meclizine with close PCP F/U. Warnings discussed.   Patient in no distress and overall condition improved here in the ED. Detailed discussions were had with the patient regarding current findings, and need for close f/u with PCP or on call doctor. The patient has been instructed to return immediately if the symptoms worsen in any way for re-evaluation. Patient verbalized understanding and is in agreement with current care plan. All questions answered prior to discharge.   Additional history obtained: -Additional history obtained from family -External records from outside source obtained and reviewed including: Chart review including previous notes, labs, imaging, consultation notes including prior ed visits, prior labs/imaging/ home medications    Lab Tests: -I ordered, reviewed, and interpreted labs.   The pertinent results include:   Labs Reviewed  CBC WITH DIFFERENTIAL/PLATELET - Abnormal; Notable for the following components:      Result Value   RBC 3.64 (*)    Hemoglobin 11.7 (*)    All other components within normal limits  COMPREHENSIVE METABOLIC PANEL - Abnormal; Notable for the following components:   BUN 5 (*)    Calcium 8.6 (*)    AST 88 (*)    ALT 76 (*)    All other components within normal limits  URINALYSIS, ROUTINE W REFLEX MICROSCOPIC - Abnormal; Notable for the following components:   APPearance HAZY (*)    Hgb urine dipstick SMALL (*)    All other components within normal limits  LIPASE, BLOOD - Abnormal; Notable for the following components:   Lipase 60 (*)    All other components within normal limits  RESP PANEL BY RT-PCR (RSV,  FLU A&B, COVID)  RVPGX2  I-STAT BETA HCG BLOOD, ED ([facility], [facility], [facility] ONLY)    Notable for hgb low but similar to her baseline. AST/ALT mildly elevated but similar to prior; bili is wnl  EKG   EKG Interpretation  Date/Time:    Ventricular Rate:    PR Interval:    QRS Duration:   QT Interval:    QTC Calculation:   R Axis:     Text Interpretation:           Imaging Studies ordered: I ordered imaging studies including CTH I independently visualized the following imaging with scope of interpretation limited to determining acute life threatening conditions related to emergency care: cth, which revealed no acute process I independently visualized and interpreted imaging. I agree with the radiologist interpretation   Medicines ordered and prescription drug management: Meds ordered this encounter  Medications   meclizine (ANTIVERT) tablet 25 mg   meclizine (ANTIVERT) 25 MG tablet    Sig: Take 1 tablet (25 mg total) by mouth 3 (three) times daily as needed for dizziness.    Dispense:  15 tablet    Refill:  0    -I have reviewed the patients home medicines and have made adjustments as needed   Consultations Obtained: I requested consultation with the na,  and discussed lab and imaging findings as well as pertinent plan - they recommend: na   Cardiac Monitoring: The patient was maintained on a cardiac monitor.  I personally viewed and interpreted the cardiac monitored which showed an underlying rhythm of: na  Social Determinants of Health:  Diagnosis or treatment significantly limited by social determinants of health: uninsured no pcp   Reevaluation: After the interventions noted above,  I reevaluated the patient and found that they have resolved  Co morbidities that complicate the patient evaluation History reviewed. No pertinent past medical history.    Dispostion: Disposition decision including need for hospitalization was considered, and patient discharged from emergency  department.    Final Clinical Impression(s) / ED Diagnoses Final diagnoses:  Other fatigue  Vertigo     This chart was dictated using voice recognition software.  Despite best efforts to proofread,  errors can occur which can change the documentation meaning.    Sloan Leiter, DO 07/09/22 [phone]

## 2022-07-08 NOTE — ED Notes (Signed)
Reports feeling better from medication.

## 2022-07-08 NOTE — Discharge Instructions (Addendum)
It was a pleasure caring for you today in the emergency department.  Please return to the emergency department for any worsening or worrisome symptoms.  Please follow up with primary care doctor listed above. Please only take medications that you purchase from a pharmacy or are prescribed by a physician.    Fue un placer atenderle hoy en el departamento de emergencias.  Regrese al departamento de emergencias si cualquier sntoma que empeore o sea preocupante.  Haga un seguimiento con el mdico de atencin primaria mencionado anteriormente. Tome nicamente medicamentos que compre en una farmacia o que le recete un mdico.

## 2022-07-08 NOTE — ED Triage Notes (Signed)
Pt reports she has been experiencing fatigue and dizzy since August.  Pt reports it comes and goes, her vision gets blurry when she gets dizzy. Negative NIH in triage

## 2022-07-08 NOTE — ED Provider Triage Note (Signed)
Emergency Medicine Provider Triage Evaluation Note  Christina Gutierrez , a 47 y.o. female  was evaluated in triage.  Pt complains of dizziness like the room spinning is intermittent and worse with position change since August 2023.  Additionally endorses a gradually declining energy since that time which is now inhibitive to her ability to work on her own.  States that if she talks too much she will become nauseous and want to vomit but there is no vomiting.  No weakness on either extremity, no numbness or tingling.  Occasional blurry vision with the dizziness..  Review of Systems  Positive: As above Negative: As above  Physical Exam  BP 123/85 (BP Location: Right Arm)   Pulse 71   Temp 98.2 F (36.8 C) (Oral)   Resp 18   SpO2 100%  Gen:   Awake, no distress   Resp:  Normal effort  MSK:   Moves extremities without difficulty  Other:  RRR no m/r/g. Neuro exam is nonfocal. NIH 0  Medical Decision Making  Medically screening exam initiated at 3:39 AM.  Appropriate orders placed.  Christina Gutierrez was informed that the remainder of the evaluation will be completed by another provider, this initial triage assessment does not replace that evaluation, and the importance of remaining in the ED until their evaluation is complete.  This chart was dictated using voice recognition software, Dragon. Despite the best efforts of this provider to proofread and correct errors, errors may still occur which can change documentation meaning.    Emeline Darling, PA-C 07/08/22 [phone]

## 2023-08-20 ENCOUNTER — Encounter (HOSPITAL_COMMUNITY): Payer: Self-pay

## 2023-08-20 ENCOUNTER — Other Ambulatory Visit: Payer: Self-pay

## 2023-08-20 ENCOUNTER — Emergency Department (HOSPITAL_COMMUNITY): Payer: Self-pay

## 2023-08-20 ENCOUNTER — Inpatient Hospital Stay (HOSPITAL_COMMUNITY)
Admission: EM | Admit: 2023-08-20 | Discharge: 2023-08-23 | DRG: 436 | Disposition: A | Discharge status: Home / Self Care | Payer: Self-pay | Attending: Internal Medicine | Admitting: Internal Medicine

## 2023-08-20 DIAGNOSIS — R52 Pain, unspecified: Secondary | ICD-10-CM

## 2023-08-20 DIAGNOSIS — D689 Coagulation defect, unspecified: Secondary | ICD-10-CM | POA: Diagnosis present

## 2023-08-20 DIAGNOSIS — R160 Hepatomegaly, not elsewhere classified: Principal | ICD-10-CM

## 2023-08-20 DIAGNOSIS — E7220 Disorder of urea cycle metabolism, unspecified: Secondary | ICD-10-CM | POA: Insufficient documentation

## 2023-08-20 DIAGNOSIS — D849 Immunodeficiency, unspecified: Secondary | ICD-10-CM | POA: Diagnosis present

## 2023-08-20 DIAGNOSIS — B019 Varicella without complication: Secondary | ICD-10-CM

## 2023-08-20 DIAGNOSIS — E872 Acidosis, unspecified: Secondary | ICD-10-CM | POA: Insufficient documentation

## 2023-08-20 DIAGNOSIS — R7401 Elevation of levels of liver transaminase levels: Secondary | ICD-10-CM | POA: Insufficient documentation

## 2023-08-20 DIAGNOSIS — R040 Epistaxis: Secondary | ICD-10-CM | POA: Diagnosis present

## 2023-08-20 DIAGNOSIS — L299 Pruritus, unspecified: Secondary | ICD-10-CM | POA: Diagnosis present

## 2023-08-20 DIAGNOSIS — K7290 Hepatic failure, unspecified without coma: Secondary | ICD-10-CM

## 2023-08-20 DIAGNOSIS — Z603 Acculturation difficulty: Secondary | ICD-10-CM | POA: Diagnosis present

## 2023-08-20 DIAGNOSIS — D649 Anemia, unspecified: Secondary | ICD-10-CM | POA: Insufficient documentation

## 2023-08-20 DIAGNOSIS — K766 Portal hypertension: Secondary | ICD-10-CM

## 2023-08-20 DIAGNOSIS — Z8616 Personal history of COVID-19: Secondary | ICD-10-CM

## 2023-08-20 DIAGNOSIS — B181 Chronic viral hepatitis B without delta-agent: Secondary | ICD-10-CM | POA: Insufficient documentation

## 2023-08-20 DIAGNOSIS — K838 Other specified diseases of biliary tract: Secondary | ICD-10-CM | POA: Diagnosis present

## 2023-08-20 DIAGNOSIS — C220 Liver cell carcinoma: Principal | ICD-10-CM

## 2023-08-20 DIAGNOSIS — R1110 Vomiting, unspecified: Secondary | ICD-10-CM

## 2023-08-20 LAB — LIPASE, BLOOD: Lipase: 47 U/L (ref 11–51)

## 2023-08-20 LAB — CBC WITH DIFFERENTIAL/PLATELET
Abs Immature Granulocytes: 0.02 10*3/uL (ref 0.00–0.07)
Basophils Absolute: 0.1 10*3/uL (ref 0.0–0.1)
Basophils Relative: 2 %
Eosinophils Absolute: 0.3 10*3/uL (ref 0.0–0.5)
Eosinophils Relative: 5 %
HCT: 24.4 % — ABNORMAL LOW (ref 36.0–46.0)
Hemoglobin: 8 g/dL — ABNORMAL LOW (ref 12.0–15.0)
Immature Granulocytes: 0 %
Lymphocytes Relative: 22 %
Lymphs Abs: 1.4 10*3/uL (ref 0.7–4.0)
MCH: 30.2 pg (ref 26.0–34.0)
MCHC: 32.8 g/dL (ref 30.0–36.0)
MCV: 92.1 fL (ref 80.0–100.0)
Monocytes Absolute: 0.6 10*3/uL (ref 0.1–1.0)
Monocytes Relative: 10 %
Neutro Abs: 3.7 10*3/uL (ref 1.7–7.7)
Neutrophils Relative %: 61 %
Platelets: 316 10*3/uL (ref 150–400)
RBC: 2.65 MIL/uL — ABNORMAL LOW (ref 3.87–5.11)
RDW: 24 % — ABNORMAL HIGH (ref 11.5–15.5)
WBC: 6.1 10*3/uL (ref 4.0–10.5)
nRBC: 0 % (ref 0.0–0.2)

## 2023-08-20 LAB — COMPREHENSIVE METABOLIC PANEL
ALT: 79 U/L — ABNORMAL HIGH (ref 0–44)
AST: 280 U/L — ABNORMAL HIGH (ref 15–41)
Albumin: 2.7 g/dL — ABNORMAL LOW (ref 3.5–5.0)
Alkaline Phosphatase: 100 U/L (ref 38–126)
Anion gap: 10 (ref 5–15)
BUN: 5 mg/dL — ABNORMAL LOW (ref 6–20)
CO2: 21 mmol/L — ABNORMAL LOW (ref 22–32)
Calcium: 9 mg/dL (ref 8.9–10.3)
Chloride: 106 mmol/L (ref 98–111)
Creatinine, Ser: 0.57 mg/dL (ref 0.44–1.00)
GFR, Estimated: >60 mL/min (ref >60)
Glucose, Bld: 103 mg/dL — ABNORMAL HIGH (ref 70–99)
Potassium: 4 mmol/L (ref 3.5–5.1)
Sodium: 137 mmol/L (ref 135–145)
Total Bilirubin: 1.8 mg/dL — ABNORMAL HIGH (ref 0.0–1.2)
Total Protein: 8.2 g/dL — ABNORMAL HIGH (ref 6.5–8.1)

## 2023-08-20 LAB — HCG, QUANTITATIVE, PREGNANCY: hCG, Beta Chain, Quant, S: <1 m[IU]/mL (ref <5)

## 2023-08-20 NOTE — ED Triage Notes (Signed)
Complains of RUQ pain that goes into her back that started 10 days ago.  Reports pain is constant and causing n/v.  Reports difficulty eating.

## 2023-08-20 NOTE — ED Provider Triage Note (Signed)
Emergency Medicine Provider Triage Evaluation Note  Christina Gutierrez , a 48 y.o. female  was evaluated in triage.  Pt complains of right upper quadrant pain that radiates around to her back.  This is been ongoing for past 10 days.  Reports difficulty with eating, having nausea.  Denies any fever or chills.  Denies any dysuria.  Review of Systems  Positive: As above Negative: As above  Physical Exam  BP 134/86 (BP Location: Right Arm)   Pulse 93   Temp 98.5 F (36.9 C)   Resp 16   SpO2 100%  Gen:   Awake, no distress   Resp:  Normal effort  MSK:   Moves extremities without difficulty    Medical Decision Making  Medically screening exam initiated at 5:15 PM.  Appropriate orders placed.  Satrina Chokshi was informed that the remainder of the evaluation will be completed by another provider, this initial triage assessment does not replace that evaluation, and the importance of remaining in the ED until their evaluation is complete.     Arabella Merles, PA-C 08/20/23 1717

## 2023-08-21 ENCOUNTER — Inpatient Hospital Stay (HOSPITAL_COMMUNITY): Payer: Self-pay

## 2023-08-21 ENCOUNTER — Encounter (HOSPITAL_COMMUNITY): Payer: Self-pay | Admitting: Internal Medicine

## 2023-08-21 ENCOUNTER — Emergency Department (HOSPITAL_COMMUNITY): Payer: Self-pay

## 2023-08-21 DIAGNOSIS — E7220 Disorder of urea cycle metabolism, unspecified: Secondary | ICD-10-CM | POA: Insufficient documentation

## 2023-08-21 DIAGNOSIS — B019 Varicella without complication: Secondary | ICD-10-CM

## 2023-08-21 DIAGNOSIS — E872 Acidosis, unspecified: Secondary | ICD-10-CM | POA: Insufficient documentation

## 2023-08-21 DIAGNOSIS — R160 Hepatomegaly, not elsewhere classified: Principal | ICD-10-CM

## 2023-08-21 DIAGNOSIS — K7290 Hepatic failure, unspecified without coma: Secondary | ICD-10-CM

## 2023-08-21 DIAGNOSIS — B181 Chronic viral hepatitis B without delta-agent: Secondary | ICD-10-CM | POA: Insufficient documentation

## 2023-08-21 DIAGNOSIS — R7401 Elevation of levels of liver transaminase levels: Secondary | ICD-10-CM | POA: Insufficient documentation

## 2023-08-21 DIAGNOSIS — D649 Anemia, unspecified: Secondary | ICD-10-CM | POA: Insufficient documentation

## 2023-08-21 DIAGNOSIS — R768 Other specified abnormal immunological findings in serum: Secondary | ICD-10-CM

## 2023-08-21 DIAGNOSIS — R1011 Right upper quadrant pain: Secondary | ICD-10-CM

## 2023-08-21 HISTORY — DX: Acidosis, unspecified: E87.20

## 2023-08-21 LAB — CBC
HCT: 23.6 % — ABNORMAL LOW (ref 36.0–46.0)
Hemoglobin: 7.6 g/dL — ABNORMAL LOW (ref 12.0–15.0)
MCH: 29.8 pg (ref 26.0–34.0)
MCHC: 32.2 g/dL (ref 30.0–36.0)
MCV: 92.5 fL (ref 80.0–100.0)
Platelets: 311 10*3/uL (ref 150–400)
RBC: 2.55 MIL/uL — ABNORMAL LOW (ref 3.87–5.11)
RDW: 23.9 % — ABNORMAL HIGH (ref 11.5–15.5)
WBC: 6.5 10*3/uL (ref 4.0–10.5)
nRBC: 0 % (ref 0.0–0.2)

## 2023-08-21 LAB — COMPREHENSIVE METABOLIC PANEL
ALT: 77 U/L — ABNORMAL HIGH (ref 0–44)
AST: 266 U/L — ABNORMAL HIGH (ref 15–41)
Albumin: 2.7 g/dL — ABNORMAL LOW (ref 3.5–5.0)
Alkaline Phosphatase: 83 U/L (ref 38–126)
Anion gap: 7 (ref 5–15)
BUN: <5 mg/dL — ABNORMAL LOW (ref 6–20)
CO2: 22 mmol/L (ref 22–32)
Calcium: 8.7 mg/dL — ABNORMAL LOW (ref 8.9–10.3)
Chloride: 105 mmol/L (ref 98–111)
Creatinine, Ser: 0.57 mg/dL (ref 0.44–1.00)
GFR, Estimated: >60 mL/min (ref >60)
Glucose, Bld: 119 mg/dL — ABNORMAL HIGH (ref 70–99)
Potassium: 3.5 mmol/L (ref 3.5–5.1)
Sodium: 134 mmol/L — ABNORMAL LOW (ref 135–145)
Total Bilirubin: 1.9 mg/dL — ABNORMAL HIGH (ref 0.0–1.2)
Total Protein: 8.1 g/dL (ref 6.5–8.1)

## 2023-08-21 LAB — AMMONIA
Ammonia: 62 umol/L — ABNORMAL HIGH (ref 9–35)
Ammonia: 66 umol/L — ABNORMAL HIGH (ref 9–35)

## 2023-08-21 LAB — URINALYSIS, ROUTINE W REFLEX MICROSCOPIC
Bilirubin Urine: NEGATIVE
Glucose, UA: NEGATIVE mg/dL
Hgb urine dipstick: NEGATIVE
Ketones, ur: NEGATIVE mg/dL
Leukocytes,Ua: NEGATIVE
Nitrite: NEGATIVE
Protein, ur: NEGATIVE mg/dL
Specific Gravity, Urine: 1.04 — ABNORMAL HIGH (ref 1.005–1.030)
pH: 6 (ref 5.0–8.0)

## 2023-08-21 LAB — RAPID HIV SCREEN (HIV 1/2 AB+AG)
HIV 1/2 Antibodies: NONREACTIVE
HIV-1 P24 Antigen - HIV24: NONREACTIVE

## 2023-08-21 LAB — PROTIME-INR
INR: 1.4 — ABNORMAL HIGH (ref 0.8–1.2)
INR: 1.5 — ABNORMAL HIGH (ref 0.8–1.2)
Prothrombin Time: 17.7 s — ABNORMAL HIGH (ref 11.4–15.2)
Prothrombin Time: 18 s — ABNORMAL HIGH (ref 11.4–15.2)

## 2023-08-21 LAB — FERRITIN: Ferritin: 168 ng/mL (ref 11–307)

## 2023-08-21 LAB — IRON AND TIBC
Iron: 99 ug/dL (ref 28–170)
Saturation Ratios: 21 % (ref 10.4–31.8)
TIBC: 483 ug/dL — ABNORMAL HIGH (ref 250–450)
UIBC: 384 ug/dL

## 2023-08-21 LAB — VITAMIN B12: Vitamin B-12: 3410 pg/mL — ABNORMAL HIGH (ref 180–914)

## 2023-08-21 LAB — ABO/RH: ABO/RH(D): O POS

## 2023-08-21 LAB — HEPATITIS PANEL, ACUTE
HCV Ab: NONREACTIVE
Hep A IgM: NONREACTIVE
Hep B C IgM: REACTIVE — AB
Hepatitis B Surface Ag: REACTIVE — AB

## 2023-08-21 LAB — FOLATE: Folate: 7.8 ng/mL (ref >5.9)

## 2023-08-21 LAB — RETICULOCYTES
Immature Retic Fract: 31.7 % — ABNORMAL HIGH (ref 2.3–15.9)
RBC.: 2.77 MIL/uL — ABNORMAL LOW (ref 3.87–5.11)
Retic Count, Absolute: 85 10*3/uL (ref 19.0–186.0)
Retic Ct Pct: 3.1 % (ref 0.4–3.1)

## 2023-08-21 LAB — PREPARE RBC (CROSSMATCH)

## 2023-08-21 MED ORDER — PANTOPRAZOLE SODIUM 40 MG PO TBEC
40.0000 mg | DELAYED_RELEASE_TABLET | Freq: Every day | ORAL | Status: DC
  Administered 2023-08-22 – 2023-08-23 (×2): 40 mg via ORAL
  Filled 2023-08-21 (×2): qty 1

## 2023-08-21 MED ORDER — ONDANSETRON HCL 4 MG/2ML IJ SOLN
4.0000 mg | Freq: Four times a day (QID) | INTRAMUSCULAR | Status: DC | PRN
  Administered 2023-08-21: 4 mg via INTRAVENOUS
  Filled 2023-08-21: qty 2

## 2023-08-21 MED ORDER — DEXTROSE 5 % IV SOLN
500.0000 mg | Freq: Three times a day (TID) | INTRAVENOUS | Status: DC
  Filled 2023-08-21 (×2): qty 10

## 2023-08-21 MED ORDER — IBUPROFEN 200 MG PO TABS
200.0000 mg | ORAL_TABLET | Freq: Four times a day (QID) | ORAL | Status: DC | PRN
  Administered 2023-08-21: 200 mg via ORAL
  Filled 2023-08-21: qty 1

## 2023-08-21 MED ORDER — SODIUM CHLORIDE 0.9% IV SOLUTION: Freq: Once | INTRAVENOUS | Status: DC

## 2023-08-21 MED ORDER — ONDANSETRON HCL 4 MG/2ML IJ SOLN
4.0000 mg | Freq: Four times a day (QID) | INTRAMUSCULAR | Status: DC
  Administered 2023-08-21 – 2023-08-23 (×8): 4 mg via INTRAVENOUS
  Filled 2023-08-21 (×8): qty 2

## 2023-08-21 MED ORDER — LACTATED RINGERS IV SOLN: INTRAVENOUS | Status: DC

## 2023-08-21 MED ORDER — SODIUM CHLORIDE 0.9% FLUSH
3.0000 mL | Freq: Two times a day (BID) | INTRAVENOUS | Status: DC
  Administered 2023-08-21 – 2023-08-22 (×3): 3 mL via INTRAVENOUS

## 2023-08-21 MED ORDER — HYDROCODONE-ACETAMINOPHEN 5-325 MG PO TABS
1.0000 | ORAL_TABLET | ORAL | Status: DC | PRN
  Administered 2023-08-21 – 2023-08-22 (×3): 2 via ORAL
  Administered 2023-08-22: 1 via ORAL
  Administered 2023-08-23: 2 via ORAL
  Filled 2023-08-21 (×5): qty 2

## 2023-08-21 MED ORDER — SODIUM CHLORIDE 0.9% FLUSH: 3.0000 mL | INTRAVENOUS | Status: DC | PRN

## 2023-08-21 MED ORDER — GADOBUTROL 1 MMOL/ML IV SOLN
10.0000 mL | Freq: Once | INTRAVENOUS | Status: AC | PRN
  Administered 2023-08-21: 10 mL via INTRAVENOUS

## 2023-08-21 MED ORDER — TRAMADOL HCL 50 MG PO TABS
50.0000 mg | ORAL_TABLET | Freq: Four times a day (QID) | ORAL | Status: DC | PRN
  Administered 2023-08-21: 50 mg via ORAL
  Filled 2023-08-21: qty 1

## 2023-08-21 MED ORDER — IOHEXOL 350 MG/ML SOLN
75.0000 mL | Freq: Once | INTRAVENOUS | Status: AC | PRN
  Administered 2023-08-21: 75 mL via INTRAVENOUS

## 2023-08-21 MED ORDER — ONDANSETRON HCL 4 MG/2ML IJ SOLN
4.0000 mg | Freq: Once | INTRAMUSCULAR | Status: AC
  Administered 2023-08-21: 4 mg via INTRAVENOUS
  Filled 2023-08-21: qty 2

## 2023-08-21 MED ORDER — MECLIZINE HCL 12.5 MG PO TABS: 12.5000 mg | ORAL_TABLET | Freq: Three times a day (TID) | ORAL | Status: DC | PRN

## 2023-08-21 MED ORDER — HYDROMORPHONE HCL 1 MG/ML IJ SOLN
1.0000 mg | Freq: Once | INTRAMUSCULAR | Status: AC
  Administered 2023-08-21: 1 mg via INTRAVENOUS
  Filled 2023-08-21: qty 1

## 2023-08-21 MED ORDER — SODIUM CHLORIDE 0.9 % IV BOLUS
1000.0000 mL | Freq: Once | INTRAVENOUS | Status: AC
  Administered 2023-08-21: 1000 mL via INTRAVENOUS

## 2023-08-21 MED ORDER — FENTANYL CITRATE PF 50 MCG/ML IJ SOSY
50.0000 ug | PREFILLED_SYRINGE | Freq: Once | INTRAMUSCULAR | Status: AC
  Administered 2023-08-21: 50 ug via INTRAVENOUS
  Filled 2023-08-21: qty 1

## 2023-08-21 MED ORDER — ACETAMINOPHEN 650 MG RE SUPP: 650.0000 mg | Freq: Four times a day (QID) | RECTAL | Status: DC | PRN

## 2023-08-21 MED ORDER — LACTULOSE 10 GM/15ML PO SOLN
10.0000 g | Freq: Two times a day (BID) | ORAL | Status: DC
  Administered 2023-08-21 – 2023-08-23 (×5): 10 g via ORAL
  Filled 2023-08-21 (×5): qty 15

## 2023-08-21 MED ORDER — DEXTROSE 5 % IV SOLN
500.0000 mg | Freq: Once | INTRAVENOUS | Status: AC
  Administered 2023-08-21: 500 mg via INTRAVENOUS
  Filled 2023-08-21: qty 10

## 2023-08-21 MED ORDER — SODIUM CHLORIDE 0.9 % IV SOLN: 250.0000 mL | INTRAVENOUS | Status: DC | PRN

## 2023-08-21 MED ORDER — ACETAMINOPHEN 325 MG PO TABS: 650.0000 mg | ORAL_TABLET | Freq: Four times a day (QID) | ORAL | Status: DC | PRN

## 2023-08-21 NOTE — H&P (Addendum)
History and Physical    Yarethzi Branan ZOX:[id number] DOB: 1975-11-20 DOA: 08/20/2023  PCP: Pcp, No   Patient coming from: Home   Chief Complaint:  Chief Complaint  Patient presents with   Abdominal Pain   ED TRIAGE note:  Complains of RUQ pain that goes into her back that started 10 days ago.  Reports pain is constant and causing n/v.  Reports difficulty eating.       HPI:  Micaila Ziemba is a 48 y.o. female with no significant past medical history present emergency department with chief complaining of right-sided abdominal pain with associated nausea vomiting for last 10 days with poor oral intake.  Patient is Spanish-speaking.  Patient reports 10 days of right upper quadrant abdominal pain that has been severe, improved when taking Excedrin.  It radiates to her right mid back.  She has had associated nausea and vomiting.  She denies changes in color of her stools or urine.  She is never had any pain like this before and denies a history of abdominal surgeries except for cesarean section. The patient reports also recently being diagnosed with chickenpox 20 days ago.  She still has some itching in her skin although her vesicles are beginning to resolve.  Patient also has noticed abnormal bleeding predominantly with new intermittent nosebleeds and passage of clots out of her nose.  She has had some abnormal bleeding whenever she scratches her skin which is also new.  During my evaluation at the bedside patient reported that patient does not have any history of vaccination in the past.  Reported that she noticed chickenpox 2 to 3 weeks ago and she took some Tylenol and Benadryl as needed for fever and itchiness.  Patient also reported nasal bleeding that started 9 AM and this morning and had intermitted throughout the day and bleeding has been stopped around 4 PM after nasal pinching and ice packing placement.  Since patient in the ED did not have any episodes of nasal  bleeding.  Patient is Spanish-speaking.  History obtained with above professional Spanish-speaking video interpreter.  ED Course: At presentation to ED patient is hemodynamically stable. CBC showing WBC count 6.1, hemoglobin 8, hematocrit 24, (baseline hemoglobin around 11.11-year ago) and normal platelet count 316. CMP showed low bicarb 21, low albumin 27, elevated AST, to 80, elevated ALT 79, elevated Ruben 1.8. Normal lipase level 47. Pregnancy test negative. Elevated pro time INR.  Elevated ammonia level 62. HIV test negative. Hepatitis panel negative. Pending UA.  Right upper quadrant ultrasound showed large heterogenoussolid mass within the liver measuring up to 16.9 cm, suspicious for malignancy. Recommend further evaluation with contrast-enhanced CT or MRI.  CT abdomen pelvis showed: 15.5 x 12.5 cm solid well-circumscribed mass within the right hepatic lobe. Areas of central low-density suggest possibility of central scar. There may be a large feeding vessel. Favor focal nodular hyperplasia although fibrolamellar HCC can have a similar appearance. Recommend further evaluation MRI without and with contrast.   Left spontaneous splenorenal shunt suggest portal venous hypertension. No evidence for cirrhosis. This may be related to mass effect and compression of the portal vein from the large mass.  Patient has epistaxis, bleeding from scratching of her skin.  Given patient is immunocompromised possibly from underlying hepatocellular carcinoma concern for disseminated varicella zoster infection/chickenpox.  Hospitalist has been contacted for further evaluation management of hepatic failure in the setting of possible hepatocellular carcinoma, concern for disseminated varicella-zoster infection.   I have spoken with infectious disease Dr.Van Dam recommended  to start IV acyclovir with pharmacy consult and continue airborne precaution.  Also spoke with [facility] gastroenterology Dr.  Myrtie Neither.  Given patient has elevated pro time INR and significant transaminitis in the setting of new hepatic mass initially I had concern for hepatic failure however per discussion with Dr. Myrtie Neither given patient has hepatic mass for quite few time which has been progressively worsening the hepatic function and there is no concern for acute hepatic failure and there is no need for transfer patient to an tertiary center for hepatic transplant.  Gastroenterology will evaluate patient in the morning.  Significant labs in the ED: Lab Orders         Varicella-zoster by PCR         CBC with Differential         Comprehensive metabolic panel         Lipase, blood         Urinalysis, Routine w reflex microscopic -Urine, Clean Catch         hCG, quantitative, pregnancy         Protime-INR         Ammonia         Rapid HIV screen (HIV 1/2 Ab+Ag)         Hepatitis panel, acute         Varicella zoster antibody, IgM         Varicella zoster antibody, IgG         CBC         Comprehensive metabolic panel         Protime-INR         Ammonia       Review of Systems:  Review of Systems  Constitutional:  Positive for malaise/fatigue. Negative for chills, fever and weight loss.  Respiratory:  Negative for cough and shortness of breath.   Cardiovascular:  Negative for chest pain, palpitations and leg swelling.  Gastrointestinal:  Positive for abdominal pain, nausea and vomiting. Negative for constipation, diarrhea and heartburn.  Musculoskeletal:  Positive for back pain. Negative for joint pain and neck pain.  Neurological:  Positive for dizziness. Negative for headaches.  Psychiatric/Behavioral:  The patient is not nervous/anxious.     History reviewed. No pertinent past medical history.  Past Surgical History:  Procedure Laterality Date   CESAREAN SECTION       reports that she has never smoked. She has never used smokeless tobacco. She reports that she does not drink alcohol and does not use  drugs.  No Known Allergies  History reviewed. No pertinent family history.  Prior to Admission medications   Medication Sig Start Date End Date Taking? Authorizing Provider  aspirin EC 325 MG tablet Take 325 mg by mouth as needed for mild pain.     [provider]  cephALEXin (KEFLEX) 500 MG capsule Take 1 capsule (500 mg total) by mouth 3 (three) times daily. 02/26/18   Charlynne Pander, MD  ibuprofen (ADVIL,MOTRIN) 600 MG tablet Take 1 tablet (600 mg total) by mouth every 6 (six) hours as needed. 02/26/18   Charlynne Pander, MD  meclizine (ANTIVERT) 25 MG tablet Take 1 tablet (25 mg total) by mouth 3 (three) times daily as needed for dizziness. 07/08/22   Sloan Leiter, DO  ondansetron (ZOFRAN-ODT) 4 MG disintegrating tablet Take 1 tablet (4 mg total) by mouth every 8 (eight) hours as needed for nausea or vomiting. 03/31/22   Benjiman Core, MD     Physical  Exam: Vitals:   08/20/23 2158 08/21/23 0045 08/21/23 0100 08/21/23 0200  BP: 137/62 134/77  119/69  Pulse: 84 89  82  Resp: 18 16  15   Temp: 98.4 F (36.9 C)  98.5 F (36.9 C)   TempSrc: Oral  Oral   SpO2: 99% 100%  100%  Weight:      Height:        Physical Exam Vitals and nursing note reviewed.  Constitutional:      Appearance: She is ill-appearing.  Cardiovascular:     Rate and Rhythm: Regular rhythm. Tachycardia present.     Heart sounds: Normal heart sounds.  Abdominal:     General: Abdomen is flat. Bowel sounds are normal. There is no distension.     Palpations: Abdomen is soft. There is mass. There is no shifting dullness, hepatomegaly or splenomegaly.     Tenderness: There is abdominal tenderness in the epigastric area. There is no guarding or rebound.  Skin:    General: Skin is dry.     Capillary Refill: Capillary refill takes less than 2 seconds.     Comments: Multiple stages of macules, papules and rash throughout patient's body.  Neurological:     Mental Status: She is alert and oriented to  person, place, and time.  Psychiatric:        Mood and Affect: Mood is anxious.      Labs on Admission: I have personally reviewed following labs and imaging studies  CBC: Recent Labs  Lab 08/20/23 1716  WBC 6.1  NEUTROABS 3.7  HGB 8.0*  HCT 24.4*  MCV 92.1  PLT 316   Basic Metabolic Panel: Recent Labs  Lab 08/20/23 1716  NA 137  K 4.0  CL 106  CO2 21*  GLUCOSE 103*  BUN 5*  CREATININE 0.57  CALCIUM 9.0   GFR: Estimated Creatinine Clearance: 75.3 mL/min (by C-G formula based on SCr of 0.57 mg/dL). Liver Function Tests: Recent Labs  Lab 08/20/23 1716  AST 280*  ALT 79*  ALKPHOS 100  BILITOT 1.8*  PROT 8.2*  ALBUMIN 2.7*   Recent Labs  Lab 08/20/23 1716  LIPASE 47   Recent Labs  Lab 08/21/23 0106  AMMONIA 62*   Coagulation Profile: Recent Labs  Lab 08/21/23 0106  INR 1.4*   Cardiac Enzymes: No results for input(s): "CKTOTAL", "CKMB", "CKMBINDEX", "TROPONINI", "TROPONINIHS" in the last 168 hours. BNP (last 3 results) No results for input(s): "BNP" in the last 8760 hours. HbA1C: No results for input(s): "HGBA1C" in the last 72 hours. CBG: No results for input(s): "GLUCAP" in the last 168 hours. Lipid Profile: No results for input(s): "CHOL", "HDL", "LDLCALC", "TRIG", "CHOLHDL", "LDLDIRECT" in the last 72 hours. Thyroid Function Tests: No results for input(s): "TSH", "T4TOTAL", "FREET4", "T3FREE", "THYROIDAB" in the last 72 hours. Anemia Panel: No results for input(s): "VITAMINB12", "FOLATE", "FERRITIN", "TIBC", "IRON", "RETICCTPCT" in the last 72 hours. Urine analysis:    Component Value Date/Time   COLORURINE AMBER (A) 08/21/2023 0253   APPEARANCEUR CLEAR 08/21/2023 0253   LABSPEC 1.040 (H) 08/21/2023 0253   PHURINE 6.0 08/21/2023 0253   GLUCOSEU NEGATIVE 08/21/2023 0253   HGBUR NEGATIVE 08/21/2023 0253   BILIRUBINUR NEGATIVE 08/21/2023 0253   KETONESUR NEGATIVE 08/21/2023 0253   PROTEINUR NEGATIVE 08/21/2023 0253   NITRITE NEGATIVE  08/21/2023 0253   LEUKOCYTESUR NEGATIVE 08/21/2023 0253    Radiological Exams on Admission: I have personally reviewed images CT ABDOMEN PELVIS W CONTRAST Result Date: 08/21/2023 CLINICAL DATA:  Right side  abdominal pain EXAM: CT ABDOMEN AND PELVIS WITH CONTRAST TECHNIQUE: Multidetector CT imaging of the abdomen and pelvis was performed using the standard protocol following bolus administration of intravenous contrast. RADIATION DOSE REDUCTION: This exam was performed according to the departmental dose-optimization program which includes automated exposure control, adjustment of the mA and/or kV according to patient size and/or use of iterative reconstruction technique. CONTRAST:  75mL OMNIPAQUE IOHEXOL 350 MG/ML SOLN COMPARISON:  08/08/2017 FINDINGS: Lower chest: No acute abnormality Hepatobiliary: There is a large mass involving the right hepatic lobe which is new since prior study. This measures 15.5 x 12.5 cm. Areas of central low-density noted within the mass which could reflect scar. The mass is isodense to the remainder the liver on portal venous phase imaging. Possible large feeding vessel noted posteromedially. Gallbladder unremarkable. Pancreas: No focal abnormality or ductal dilatation. Spleen: No focal abnormality.  Normal size. Adrenals/Urinary Tract: No suspicious renal or adrenal lesion. No stones or hydronephrosis. Urinary bladder unremarkable. Stomach/Bowel: Normal appendix. Stomach, large and small bowel grossly unremarkable. Vascular/Lymphatic: No evidence of aneurysm or adenopathy. There is a spontaneous left splenorenal shunt with suggest portal venous hypertension. This may be due to mass effect and compression on the main portal vein from the large right hepatic mass. Reproductive: Uterus and adnexa unremarkable.  No mass. Other: No free fluid or free air. Musculoskeletal: No acute bony abnormality. IMPRESSION: 15.5 x 12.5 cm solid well-circumscribed mass within the right hepatic lobe.  Areas of central low-density suggest possibility of central scar. There may be a large feeding vessel. Favor focal nodular hyperplasia although fibrolamellar HCC can have a similar appearance. Recommend further evaluation MRI without and with contrast. Left spontaneous splenorenal shunt suggest portal venous hypertension. No evidence for cirrhosis. This may be related to mass effect and compression of the portal vein from the large mass. Electronically Signed   By: Charlett Nose M.D.   On: 08/21/2023 01:38   US Abdomen Limited RUQ (LIVER/GB) Result Date: 08/20/2023 CLINICAL DATA:  Right upper quadrant pain EXAM: ULTRASOUND ABDOMEN LIMITED RIGHT UPPER QUADRANT COMPARISON:  CT 08/08/2017 FINDINGS: Gallbladder: No gallstones or wall thickening visualized. No sonographic Murphy sign noted by sonographer. Common bile duct: Diameter: 2.2 mm Liver: Large heterogeneous solid mass measuring 14.8 x 16.4 x 16.9 cm. Portal vein is patent on color Doppler imaging with possible reversal of flow. Other: None. IMPRESSION: 1. Large heterogeneous solid mass within the liver measuring up to 16.9 cm, suspicious for malignancy. Recommend further evaluation with contrast-enhanced CT or MRI. 2. Possible reversal of flow in the portal vein. Electronically Signed   By: Jasmine Pang M.D.   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  Patient also reported had a chickenpox 20 days ago  did not receive any treatment.  No previous history of vaccination in the past - at presentation to ED patient is hemodynamically stable. - CBC no leukocytosis, low hemoglobin 8, low hematocrit 24 (baseline H&H 11.7 and 36.  Normal platelet count 316. -CMP  showed low albumin 2.7, elevated AST to 80, elevated ALT 79, elevated bilirubin 1.8.  Normal lipase level.  Elevated ammonia 62.  Elevated pro time 17.7 and INR 1.4.  -Hepatitis panel positive with hep B antigen and hepatitis B IgM antibody reactive. -Hepatic ultrasound Large heterogeneous solid mass within the liver measuring up to 16.9 cm, suspicious for malignancy. Recommend further evaluation with contrast-enhanced CT or MRI. - CT abdomen pelvis showed 15.5 x 12.5 cm solid well-circumscribed mass within the right hepatic lobe. Areas of central low-density suggest possibility of central scar. There may be a large feeding vessel. Favor focal nodular hyperplasia although fibrolamellar HCC can have a similar Appearance. Also Left spontaneous splenorenal shunt suggest portal venous hypertension. No evidence for cirrhosis. This may be related to mass effect and compression of the portal vein from the large mass. -Given patient has coagulopathy, epistaxis in the setting of hepatic function abnormality consulted and spoke with gastroenterology Dr. Myrtie Neither, per discussion patient and hepatic function abnormality and coagulopathy from hepatic mass which has been ongoing for a while rather than sudden development and there is no concern for acute liver failure and there is no need for transfer patient to tertiary center for hepatic transplant given this is not a case of acute liver failure.  Gastroenterology team will evaluate patient in the daytime. - Obtain MRI without contrast of the liver. - patient has epistaxis even though it which has been resolved as well as patient is complaining about subcutaneous small bleeding however she picks at her skin due to  itching. In the setting of elevated pro time INR treating patient with IV FFP. -Spoke with patient and she is giving consent for FFP and blood transfusion if needed. -Continue clear liquid diet as patient tolerates.  Continue maintenance fluid LR 100 cc/h. -Continue to monitor hepatic function panel. - Will follow-up with gastroenterology formal recommendation.  -Appreciate GI team input.   Disseminated varicella-zoster infection -Patient reported chickenpox 20 days ago and will need to Tylenol and Benadryl over-the-counter from the pharmacy. - Physical exam showing disseminated chickenpox with different stages of healing. - Consulted ID Dr. Algis Liming who recommended IV acyclovir pharmacy consult and start airborne precaution. -Consulting pharmacy for IV acyclovir dosing adjustment. - Appreciate ID input and recommendation.  History of dizziness -Patient is complaining about dizziness.  Continue meclizine as needed  Normocytic anemia -Hemoglobin 8 and hematocrit 24.  Normal MCV.  Patient's hemoglobin was around 11 one-year ago.  Patient denies any hematemesis and melena. -Continue to monitor H&H.  If hemoglobin continues to drop below 7 we will transfuse 1 unit of blood.  Hyperammonemia - Continue lactulose 10 g twice daily with goal to have bowel movement 2-3 times daily.  Continue to trend ammonia level until normalized.   Non-anion gap metabolic acidosis-secondary to vomiting - Low bicarb level 21.  Non-anion gap metabolic acidosis in the setting of vomiting. - Continue IV fluid resuscitation with LR 100 cc/h.  Continue Zofran as needed for management of vomiting.  Continue monitor bicarb level  DVT prophylaxis:  SCDs.  Deferring pharmacological prophylaxis in the setting of epistaxis in the context of hepatic dysfunction. Code Status:  Full Code Diet: Clear liquid diet Family Communication:   Family was present at bedside, at the time of interview. Opportunity was given to ask  question and all questions were answered satisfactorily.  Disposition Plan: Will follow-up with MRI of the hepatic panel and based on the results need to reach out to oncology for further recommendation.  Will follow-up with gastroenterology and ID formal consult. Consults: Gastroenterology and infectious disease Admission status:   Inpatient, progressive unit  Severity of Illness: The appropriate patient status for this patient is INPATIENT. Inpatient status is judged to be reasonable and necessary in order to provide the required intensity of service to ensure the patient's safety. The patient's presenting symptoms, physical exam findings, and initial radiographic and laboratory data in the context of their chronic comorbidities is felt to place them at high risk for further clinical deterioration. Furthermore, it is not anticipated that the patient will be medically stable for discharge from the hospital within 2 midnights of admission.   * I certify that at the point of admission it is my clinical judgment that the patient will require inpatient hospital care spanning beyond 2 midnights from the point of admission due to high intensity of service, high risk for further deterioration and high frequency of surveillance required.Marland Kitchen    Tereasa Coop, MD [facility]  How to contact the [facility] Attending or Consulting provider 7A - 7P or covering provider during after hours 7P -7A, for this patient.  Check the care team in [facility] and look for a) attending/consulting [facility] provider listed and b) the [facility] team listed Log into [email or web address] and use [facility]'s universal password to access. If you do not have the password, please contact the hospital operator. Locate the [facility] provider you are looking for under [facility] and page to a number that you can be directly reached. If you still have difficulty reaching the provider, please page the [facility] (Director on Call) for the Hospitalists listed on  amion for assistance.  08/21/2023, 4:29 AM

## 2023-08-21 NOTE — ED Notes (Signed)
Pt c/o  nausea  she just returned from mri approx 30 minutes ago

## 2023-08-21 NOTE — Progress Notes (Signed)
Patient seen and examined.  Admitted early morning hours by nighttime hospitalist.  H&P assessment and plan reviewed and agreed.  Also discussed with infectious disease team and discontinued isolation precautions as well as acyclovir.  Patient had mild discomfort right upper quadrant but denied any complaints at the time of my interview.  Denied any itching or bleeding.  In brief, 48 year old with no significant medical history presented with right upper quadrant abdominal pain for about 1 week, poor oral intake, nausea and vomiting.  She had also noticed exanthematous rashes that family diagnosed as chickenpox about 20 days ago.  She had itchy skin rashes that has already been healed and improved.  She also complained of epistaxis.  In the emergency room, hemodynamically stable.  WC count 6.1.  Hemoglobin 8.  Hematocrit 24.  Platelet 316.  AST and ALT mildly elevated.  Lipase 47.  INR mildly elevated.  Ammonia 62.  HIV negative.  Hepatitis panel positive for IgM antibody, surface antigen positive.  Liver ultrasound and subsequent CT scan with 15 cm well-circumscribed central low-density mass right hepatic lobe.   Liver tumor, suspected hepatocellular carcinoma with mild compressive symptoms and portal hypertension. Mildly abnormal LFTs. Coagulopathy , no active bleeding  Anemia of chronic disease  Hep B surface antigen positive, core antibody IgM positive.  Medically improving.  Advance to regular diet.  MRI of the liver, may need aspiration/biopsy depending upon the findings. Can go to MedSurg bed. ID and GI following.  Acyclovir discontinued. Currently no indication for transfusion.  Total time spent: 30 minutes.  No charge visit.  Same-day admit.

## 2023-08-21 NOTE — ED Notes (Signed)
Pt c/o nausea and also a baD HEADACHE    ONLY PILLS ORDERED FOR PAIN  SHE FEELS LIKE SHE WILL VOMIT IT UP AT [facility] TIME

## 2023-08-21 NOTE — Consult Note (Signed)
[facility]    Date of Admission:  08/20/2023     Total days of antibiotics 0               Reason for Consult: Liver Mass / Varicella / Hepatitis B  Referring Provider: Dr. Janalyn Shy Primary Care Provider: Pcp, No   ASSESSMENT:  Christina Gutierrez is a 48 y/o female with no significant history presenting with right upper quadrant pain and found to have a well circumscribed mass in the right upper hepatic lobe, Hepatitis B infection and resolving Varicella infection. Varicella blood work is pending and with no new lesions or active appearing lesions will discontinue acyclovir and airborne/contact precautions. Positive Hepatis B surface antigen with Hepatitis B DNA level pending. Check Hepatitis B e antigen, Hepatitis B e antibody and Hepatitis D antibody. No evidence of cirrhosis, however liver chemistries are elevated (possibly from liver mass) and will await lab work to determine need for treatment of Hepatitis B. Liver MRI pending and does not appear to be infectious at this point with no indication for antibiotics. Remaining medical and supportive care per Internal Medicine.   PLAN:  Discontinue acyclovir and airborne/contact precautions. Obtain Hepatitis B lab work to determine need for treatment.  MRI liver to evaluate liver mass.  Remaining medical and supportive care per Internal Medicine.    Principal Problem:   Liver mass Active Problems:   Hepatic failure (HCC)   Normocytic anemia   Transaminitis   Disseminated varicella   Metabolic acidosis   Hyperammonemia (HCC)   Chronic viral hepatitis B without delta-agent (HCC)    sodium chloride   Intravenous Once   lactulose  10 g Oral BID   sodium chloride flush  3 mL Intravenous Q12H     HPI: Christina Gutierrez is a 48 y.o. female with no significant previous medical history presenting to the hospital with the chief complaint of right upper quadrant pain.  Christina Gutierrez  arrived to the hospital with 10-day history of right upper quadrant pain with associated nausea and vomiting and decreased oral intake that was refractory to treatment with Excedrin.  Reportedly diagnosed with varicella about 20 days ago with residual itching and vesicles in various stages and beginning to resolve.  Ultrasound abdomen with large heterogeneous solid mass within the liver and follow-up CT abdomen/pelvis with solid well-circumscribed mass in the right hepatic lobe favoring focal nodular hyperplasia although fibrolamella HCC may have similar appearance; and no evidence of cirrhosis.  No history of vaccination for varicella.  ID provider on-call started acyclovir and placed on airborne precautions.   Christina Gutierrez has been afebrile with no leukocytosis. Rash started about 2-3 weeks ago and was itchy and burning at times and spread sporadically over trunk and extremities. Self-diagnosed with varicella infection. No previous vaccination. 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 [place] recent travel has been to [place] to see family. Began having abdominal pain about 10 days ago. Fevers with onset of rash but not recently. Weight has been stable over the past few months but prior lost about 50 pounds over 2 years. Denies night sweats.  Primary preferred language is Spanish and a medical interpreter is present to aid in communication via tablet.  Review of Systems: Review of Systems  Constitutional:  Negative for chills, fever and weight loss.  Respiratory:  Negative for cough, shortness of breath and  wheezing.   Cardiovascular:  Negative for chest pain and leg swelling.  Gastrointestinal:  Positive for abdominal pain. Negative for constipation, diarrhea, nausea and vomiting.  Skin:  Negative for rash.  Neurological:  Positive for headaches.     History reviewed. No pertinent past  medical history.  Social History   Tobacco Use   Smoking status: Never   Smokeless tobacco: Never  Vaping Use   Vaping status: Never Used  Substance Use Topics   Alcohol use: No   Drug use: No    History reviewed. No pertinent family history.  No Known Allergies  OBJECTIVE: Blood pressure 125/79, pulse 96, temperature 98.7 F (37.1 C), temperature source Oral, resp. rate 14, height 5' (1.524 m), weight 67.6 kg, SpO2 99%.  Physical Exam Constitutional:      General: She is not in acute distress.    Appearance: She is well-developed.  Cardiovascular:     Rate and Rhythm: Normal rate and regular rhythm.     Heart sounds: Normal heart sounds.  Pulmonary:     Effort: Pulmonary effort is normal.     Breath sounds: Normal breath sounds.  Skin:    General: Skin is warm and dry.  Neurological:     Mental Status: She is alert and oriented to person, place, and time.  Psychiatric:        Mood and Affect: Mood normal.     Lab Results Lab Results  Component Value Date   WBC 6.5 08/21/2023   HGB 7.6 (L) 08/21/2023   HCT 23.6 (L) 08/21/2023   MCV 92.5 08/21/2023   PLT 311 08/21/2023    Lab Results  Component Value Date   CREATININE 0.57 08/21/2023   BUN <5 (L) 08/21/2023   NA 134 (L) 08/21/2023   K 3.5 08/21/2023   CL 105 08/21/2023   CO2 22 08/21/2023    Lab Results  Component Value Date   ALT 77 (H) 08/21/2023   AST 266 (H) 08/21/2023   ALKPHOS 83 08/21/2023   BILITOT 1.9 (H) 08/21/2023     Microbiology: No results found for this or any previous visit (from the past 240 hours).   Marcos Eke, NP [facility]  08/21/2023  1:16 PM

## 2023-08-21 NOTE — Progress Notes (Addendum)
Pharmacy Antimicrobial Note  Christina Gutierrez is a 48 y.o. female admitted on 08/20/2023 with concern for disseminated varicella-zoster virus.  Pharmacy has been consulted for acyclovir dosing.  Plan: Acyclovir 500mg  IV Q8H. Maintain LR at 100 ml/hr for renal protection.  Height: 5' (152.4 cm) Weight: 67.6 kg (149 lb) IBW/kg (Calculated) : 45.5  Temp (24hrs), Avg:98.5 F (36.9 C), Min:98.4 F (36.9 C), Max:98.5 F (36.9 C)  Recent Labs  Lab 08/20/23 1716  WBC 6.1  CREATININE 0.57    Estimated Creatinine Clearance: 75.3 mL/min (by C-G formula based on SCr of 0.57 mg/dL).    No Known Allergies   Thank you for allowing pharmacy to be a part of this patient's care.  Vernard Gambles, PharmD, BCPS  08/21/2023 4:26 AM

## 2023-08-21 NOTE — ED Provider Notes (Signed)
[place] EMERGENCY DEPARTMENT AT [facility] Provider Note   CSN: [id number] Arrival date & time: 08/20/23  1655     History  Chief Complaint  Patient presents with   Abdominal Pain    Christina Gutierrez is a 48 y.o. female with no significant past medical history who presents emergency department with chief complaint of right upper quadrant abdominal pain nausea and vomiting x 10 days.  Patient is Spanish-speaking and professional translation services are utilized.  Patient reports 10 days of right upper quadrant abdominal pain that has been severe, improved when taking Excedrin.  It radiates to her right mid back.  She has had associated nausea and vomiting.  She denies changes in color of her stools or urine.  She is never had any pain like this before and denies a history of abdominal surgeries except for cesarean section. The patient reports also recently being diagnosed with chickenpox 20 days ago.  She still has some itching in her skin although her vesicles are beginning to resolve.  Patient also has noticed abnormal bleeding predominantly with new intermittent nosebleeds and passage of clots out of her nose.  She has had some abnormal bleeding whenever she scratches her skin which is also new.   Abdominal Pain      Home Medications Prior to Admission medications   Medication Sig Start Date End Date Taking? Authorizing Provider  aspirin EC 325 MG tablet Take 325 mg by mouth as needed for mild pain.     [provider]  cephALEXin (KEFLEX) 500 MG capsule Take 1 capsule (500 mg total) by mouth 3 (three) times daily. 02/26/18   Charlynne Pander, MD  ibuprofen (ADVIL,MOTRIN) 600 MG tablet Take 1 tablet (600 mg total) by mouth every 6 (six) hours as needed. 02/26/18   Charlynne Pander, MD  meclizine (ANTIVERT) 25 MG tablet Take 1 tablet (25 mg total) by mouth 3 (three) times daily as needed for dizziness. 07/08/22   Sloan Leiter, DO  ondansetron  (ZOFRAN-ODT) 4 MG disintegrating tablet Take 1 tablet (4 mg total) by mouth every 8 (eight) hours as needed for nausea or vomiting. 03/31/22   Benjiman Core, MD      Allergies    Patient has no known allergies.    Review of Systems   Review of Systems  Gastrointestinal:  Positive for abdominal pain.    Physical Exam Updated Vital Signs BP 134/77   Pulse 89   Temp 98.5 F (36.9 C) (Oral)   Resp 16   Ht 5' (1.524 m)   Wt 67.6 kg   SpO2 100%   BMI 29.10 kg/m  Physical Exam Vitals and nursing note reviewed.  Constitutional:      General: She is not in acute distress.    Appearance: She is well-developed. She is not diaphoretic.  HENT:     Head: Normocephalic and atraumatic.     Right Ear: External ear normal.     Left Ear: External ear normal.     Nose: Nose normal.     Mouth/Throat:     Mouth: Mucous membranes are moist.  Eyes:     General: No scleral icterus.    Conjunctiva/sclera: Conjunctivae normal.  Cardiovascular:     Rate and Rhythm: Normal rate and regular rhythm.     Heart sounds: Normal heart sounds. No murmur heard.    No friction rub. No gallop.  Pulmonary:     Effort: Pulmonary effort is normal. No respiratory distress.  Breath sounds: Normal breath sounds.  Abdominal:     General: Bowel sounds are normal. There is no distension.     Palpations: Abdomen is soft. There is no mass.     Tenderness: There is abdominal tenderness in the right upper quadrant. There is no guarding.  Musculoskeletal:     Cervical back: Normal range of motion.  Skin:    General: Skin is warm and dry.  Neurological:     Mental Status: She is alert and oriented to person, place, and time.  Psychiatric:        Behavior: Behavior normal.     ED Results / Procedures / Treatments   Labs (all labs ordered are listed, but only abnormal results are displayed) Labs Reviewed  CBC WITH DIFFERENTIAL/PLATELET - Abnormal; Notable for the following components:      Result Value    RBC 2.65 (*)    Hemoglobin 8.0 (*)    HCT 24.4 (*)    RDW 24.0 (*)    All other components within normal limits  COMPREHENSIVE METABOLIC PANEL - Abnormal; Notable for the following components:   CO2 21 (*)    Glucose, Bld 103 (*)    BUN 5 (*)    Total Protein 8.2 (*)    Albumin 2.7 (*)    AST 280 (*)    ALT 79 (*)    Total Bilirubin 1.8 (*)    All other components within normal limits  LIPASE, BLOOD  HCG, QUANTITATIVE, PREGNANCY  URINALYSIS, ROUTINE W REFLEX MICROSCOPIC  PROTIME-INR  AMMONIA  RAPID HIV SCREEN (HIV 1/2 AB+AG)  HEPATITIS PANEL, ACUTE    EKG None  Radiology US Abdomen Limited RUQ (LIVER/GB) Result Date: 08/20/2023 CLINICAL DATA:  Right upper quadrant pain EXAM: ULTRASOUND ABDOMEN LIMITED RIGHT UPPER QUADRANT COMPARISON:  CT 08/08/2017 FINDINGS: Gallbladder: No gallstones or wall thickening visualized. No sonographic Murphy sign noted by sonographer. Common bile duct: Diameter: 2.2 mm Liver: Large heterogeneous solid mass measuring 14.8 x 16.4 x 16.9 cm. Portal vein is patent on color Doppler imaging with possible reversal of flow. Other: None. IMPRESSION: 1. Large heterogeneous solid mass within the liver measuring up to 16.9 cm, suspicious for malignancy. Recommend further evaluation with contrast-enhanced CT or MRI. 2. Possible reversal of flow in the portal vein. Electronically Signed   By: Jasmine Pang M.D.   On: 08/20/2023 19:04    Procedures Procedures    Medications Ordered in ED Medications  fentaNYL (SUBLIMAZE) injection 50 mcg (50 mcg Intravenous Given 08/21/23 0110)  ondansetron (ZOFRAN) injection 4 mg (4 mg Intravenous Given 08/21/23 0110)  sodium chloride 0.9 % bolus 1,000 mL (1,000 mLs Intravenous New Bag/Given 08/21/23 0110)    ED Course/ Medical Decision Making/ A&P Clinical Course as of 08/21/23 0437  Fri Aug 21, 2023  0055 US Abdomen Limited RUQ (LIVER/GB) [AH]  0121 Hemoglobin(!): 8.0 [AH]  0122 Total Bilirubin(!): 1.8 [AH]  0122  AST(!): 280 [AH]  0122 ALT(!): 79 [AH]  0122 Albumin(!): 2.7 [AH]  0122 Total Protein(!): 8.2 [AH]  0256 INR(!): 1.4 [AH]  0256 Ammonia(!): 62 [AH]    Clinical Course User Index [AH] Arthor Captain, PA-C                                 Medical Decision Making Amount and/or Complexity of Data Reviewed Labs: ordered. Decision-making details documented in ED Course. Radiology:  Decision-making details documented in ED Course.  Risk  Prescription drug management. Decision regarding hospitalization.   This patient presents to the ED for concern of RUQ pain, this involves an extensive number of treatment options, and is a complaint that carries with it a high risk of complications and morbidity.  The differential diagnosis for RUQ is, but not limited to:  Cholelithiasis, cholecystitis, cholangitis, choledocholithiasis, hepatitis, pancreatitis, RLL pneumonia, pyelonephritis, urinary calculi, abdominal/liver abscess, musculoskeletal pain, herpes zoster.   Co morbidities:  no known current comorbidities  Social Determinants of Health:   SDOH Screenings   Tobacco Use: Low Risk  (08/21/2023)  Language barrier   Additional history:  {Additional history obtained from patient has been at bedside   Lab Tests:  I Ordered, and personally interpreted labs.  The pertinent results include:   Labs reviewed CMP shows elevated liver enzymes with AST of 280, ALT of 79, low albumin at 2.7, bilirubin 1.8 CBC shows profound anemia at 8.  Last known hemoglobin 11.2 years ago Ammonia elevated at 62, PT/INR shows INR of 1.4 rapid HIV and hepatitis panel negative, urine does not appear infected.  Imaging Studies:  I ordered imaging studies including ultrasound of the right upper quadrant as well as CT scan of the abdomen and pelvis I independently visualized and interpreted imaging which showed large liver mass, portal venous hypertension I agree with the radiologist interpretation  Cardiac  Monitoring/ECG:  The patient was maintained on a cardiac monitor.  I personally viewed and interpreted the cardiac monitored which showed an underlying rhythm of: This rhythm  Medicines ordered and prescription drug management:  I ordered medication including  Fluids, pain medication, antiemetics for nausea and pain control Reevaluation of the patient after these medicines showed that the patient improved I have reviewed the patients home medicines and have made adjustments as needed  Test Considered:    Critical Interventions:    Consultations Obtained: DR. Janalyn Shy of [facility] for admission  Problem List / ED Course:     ICD-10-CM   1. Liver mass  R16.0     2. Intractable pain  R52     3. Vomiting, unspecified vomiting type, unspecified whether nausea present  R11.10     4. Portal venous hypertension (HCC)  K76.6       MDM: Patient here with severe domino pain.  Appears she has a large liver mass.  After discussion with the patient about findings she admits that the symptoms began 2 years ago when she was diagnosed with COVID.  Since that time she has had soaking night sweats, fatigue, a 50 pound unintentional weight loss.   Dispostion:  After consideration of the diagnostic results and the patients response to treatment, I feel that the patent would benefit from admission for pain and nausea control and further evaluation of liver mass.         Final Clinical Impression(s) / ED Diagnoses Final diagnoses:  Liver mass  Intractable pain  Vomiting, unspecified vomiting type, unspecified whether nausea present  Portal venous hypertension [facility])    Rx / DC Orders ED Discharge Orders     None         Arthor Captain, PA-C 08/21/23 0443    Sabas Sous, MD 08/21/23 [phone]

## 2023-08-21 NOTE — ED Notes (Signed)
Family at the bedside.

## 2023-08-21 NOTE — ED Notes (Signed)
The pt has been taken  to [facility]

## 2023-08-21 NOTE — ED Notes (Signed)
THE PT IS C/O A SEVERE HEADACHE 10/10

## 2023-08-21 NOTE — Consult Note (Addendum)
Consultation  Referring Provider: [facility]/ [facility] Primary Care Physician:  Pcp, No Primary Gastroenterologist:  unassigned  Reason for Consultation:  severe RUQ pain, large hepatic mass  HPI: Christina Gutierrez is a 48 y.o. non-English-speaking/Hispanic female generally previously felt to be in good health who presented to the emergency room yesterday with complaints of right upper quadrant pain which she says has been present for about 8 days, constant and fairly severe especially with movement.  She has not had any radiation into her back, no nausea or vomiting, and no change in pain with p.o. intake.  She has not been aware of any fever or chills. Workup in the ER with abdominal ultrasound showed a large heterogeneous solid mass in the liver measuring up to 16.9 cm, suspicious for malignancy also mentioned portal vein may have reversal of flow CT of the abdomen and pelvis with contrast again shows a large mass involving the right hepatic lobe new since prior study, this measures 15 x 5 x 12.5 cm, there is a low-density area centrally within the mass which could reflect scar, masses isodense, possible large feeding vessel noted posterior medially, gallbladder unremarkable, spleen normal, there is spontaneous left splenorenal shunt with suggestion of portal venous hypertension may be due to mass effect and compression of the main portal vein from the large mass.  Labs show pro time 1.4/INR 17.7 Acute hepatitis panel hepatitis C negative/B surface antigen reactive/hep B core IgM positive WBC 6.5/hemoglobin 7.6/hematocrit 23.6 this is down from hemoglobin of 11.7 on 07/08/2022/platelets 311 Sodium 134/potassium 3.5/BUN less than 5/creatinine 0.57 T. bili 1.9/alk phos 83/AST 266/ALT 77 Ammonia 66  Per the chart patient had recently been diagnosed with chickenpox about 20 days ago still complaining of some itching and has vesicles which appear to be resolving, also had reported some  intermittent nosebleeds recently. She is being seen by ID and being covered for varicella-zoster  MRI of the liver is ordered  History reviewed. No pertinent past medical history.  Past Surgical History:  Procedure Laterality Date   CESAREAN SECTION      Prior to Admission medications   Not on File    Current Facility-Administered Medications  Medication Dose Route Frequency Provider Last Rate Last Admin   0.9 %  sodium chloride infusion (Manually program via Guardrails IV Fluids)   Intravenous Once Janalyn Shy, Subrina, MD   Held at 08/21/23 0428   0.9 %  sodium chloride infusion  250 mL Intravenous PRN Sundil, Subrina, MD       ibuprofen (ADVIL) tablet 200 mg  200 mg Oral Q6H PRN Janalyn Shy, Subrina, MD       lactulose (CHRONULAC) 10 GM/15ML solution 10 g  10 g Oral BID Janalyn Shy, Subrina, MD   10 g at 08/21/23 9147   meclizine (ANTIVERT) tablet 12.5 mg  12.5 mg Oral TID PRN Janalyn Shy, Subrina, MD       ondansetron [facility]) injection 4 mg  4 mg Intravenous Q6H PRN Janalyn Shy, Subrina, MD   4 mg at 08/21/23 0924   sodium chloride flush (NS) 0.9 % injection 3 mL  3 mL Intravenous Q12H Sundil, Subrina, MD       sodium chloride flush (NS) 0.9 % injection 3 mL  3 mL Intravenous PRN Janalyn Shy, Subrina, MD       No current outpatient medications on file.    Allergies as of 08/20/2023   (No Known Allergies)    History reviewed. No pertinent family history.  Social History   Socioeconomic History  Marital status: Married    Spouse name: Not on file   Number of children: Not on file   Years of education: Not on file   Highest education level: Not on file  Occupational History   Not on file  Tobacco Use   Smoking status: Never   Smokeless tobacco: Never  Vaping Use   Vaping status: Never Used  Substance and Sexual Activity   Alcohol use: No   Drug use: No   Sexual activity: Not on file  Other Topics Concern   Not on file  Social History Narrative   Not on file   Social Drivers of Health    Financial Resource Strain: Not on file  Food Insecurity: Not on file  Transportation Needs: Not on file  Physical Activity: Not on file  Stress: Not on file  Social Connections: Not on file  Intimate Partner Violence: Not on file    Review of Systems: Pertinent positive and negative review of systems were noted in the above HPI section.  All other review of systems was otherwise negative.  Physical Exam: Vital signs in last 24 hours: Temp:  [97.8 F (36.6 C)-98.5 F (36.9 C)] 97.8 F (36.6 C) (02/14 0932) Pulse Rate:  [82-98] 92 (02/14 0932) Resp:  [14-18] 18 (02/14 0932) BP: (106-137)/(46-88) 121/78 (02/14 0932) SpO2:  [94 %-100 %] 97 % (02/14 0932) Weight:  [67.6 kg] 67.6 kg (02/13 1716)   General:   Alert,  Well-developed, Hispanic female pleasant and cooperative in NAD, uncomfortable appearing complaining of headache Head:  Normocephalic and atraumatic. Eyes:  Sclera clear, no icterus.   Conjunctiva pink. Ears:  Normal auditory acuity. Nose:  No deformity, discharge,  or lesions. Mouth:  No deformity or lesions.   Neck:  Supple; no masses or thyromegaly. Lungs:  Clear throughout to auscultation.   No wheezes, crackles, or rhonchi.  Heart:  Regular rate and rhythm; no murmurs, clicks, rubs,  or gallops. Abdomen:  Soft, there is some tenderness in the right upper quadrant, liver nonenlarged, no guarding or rebound, BS active,nonpalp mass or hsm.   Rectal: Not done Msk:  Symmetrical without gross deformities. . Pulses:  Normal pulses noted. Extremities:  Without clubbing or edema. Neurologic:  Alert and  oriented x4;  grossly normal neurologically. Skin:  Intact without significant lesions or rashes.. Psych:  Alert and cooperative. Normal mood and affect.  Intake/Output from previous day: 02/13 0701 - 02/14 0700 In: 100 [IV Piggyback:100] Out: -  Intake/Output this shift: No intake/output data recorded.  Lab Results: Recent Labs    08/20/23 1716 08/21/23 0614   WBC 6.1 6.5  HGB 8.0* 7.6*  HCT 24.4* 23.6*  PLT 316 311   BMET Recent Labs    08/20/23 1716 08/21/23 0614  NA 137 134*  K 4.0 3.5  CL 106 105  CO2 21* 22  GLUCOSE 103* 119*  BUN 5* <5*  CREATININE 0.57 0.57  CALCIUM 9.0 8.7*   LFT Recent Labs    08/21/23 0614  PROT 8.1  ALBUMIN 2.7*  AST 266*  ALT 77*  ALKPHOS 83  BILITOT 1.9*   PT/INR Recent Labs    08/21/23 0106 08/21/23 0614  LABPROT 17.7* 18.0*  INR 1.4* 1.5*   Hepatitis Panel Recent Labs    08/21/23 0108  HEPBSAG Reactive*  HCVAB NON REACTIVE  HEPAIGM NON REACTIVE  HEPBIGM Reactive*     IMPRESSION:  #75 48 year old non-English-speaking Hispanic female presented to the emergency room with complaints of severe right upper  quadrant pain constant over the past 8 days Workup with ultrasound and CT scan both show a very large mass in the right lobe of the liver measuring about 15 cm, also appears to be a large feeding vessel.  There is a left spontaneous splenorenal shunt suggesting portal venous hypertension though no evidence for cirrhosis and this may be related to mass effect and compression of the portal vein of the mass.  Unclear whether this is a benign or malignant lesion presently though significant concern for HCC   #2 positive hepatitis B surface antigen and hep B core IgM consistent with chronic hepatitis B  #3 normocytic anemia etiology not clear  #4 probable acute varicella-zoster, ID following and placed on Zostrix   PLAN: Await MRI of the liver Check AFP Check hepatitis B PCR quant Hep B sAB Anemia panel GI will follow with you, further recommendations pending results of MRI   Johnmichael Melhorn EsterwoodPA-C  08/21/2023, 10:31 AM

## 2023-08-22 DIAGNOSIS — B181 Chronic viral hepatitis B without delta-agent: Secondary | ICD-10-CM

## 2023-08-22 DIAGNOSIS — C220 Liver cell carcinoma: Secondary | ICD-10-CM

## 2023-08-22 DIAGNOSIS — L299 Pruritus, unspecified: Secondary | ICD-10-CM

## 2023-08-22 HISTORY — DX: Liver cell carcinoma: C22.0

## 2023-08-22 LAB — COMPREHENSIVE METABOLIC PANEL
ALT: 70 U/L — ABNORMAL HIGH (ref 0–44)
AST: 227 U/L — ABNORMAL HIGH (ref 15–41)
Albumin: 2.7 g/dL — ABNORMAL LOW (ref 3.5–5.0)
Alkaline Phosphatase: 75 U/L (ref 38–126)
Anion gap: 11 (ref 5–15)
BUN: 6 mg/dL (ref 6–20)
CO2: 19 mmol/L — ABNORMAL LOW (ref 22–32)
Calcium: 8.8 mg/dL — ABNORMAL LOW (ref 8.9–10.3)
Chloride: 105 mmol/L (ref 98–111)
Creatinine, Ser: 0.69 mg/dL (ref 0.44–1.00)
GFR, Estimated: >60 mL/min (ref >60)
Glucose, Bld: 92 mg/dL (ref 70–99)
Potassium: 3.6 mmol/L (ref 3.5–5.1)
Sodium: 135 mmol/L (ref 135–145)
Total Bilirubin: 2 mg/dL — ABNORMAL HIGH (ref 0.0–1.2)
Total Protein: 7.5 g/dL (ref 6.5–8.1)

## 2023-08-22 LAB — HEPATITIS B CORE ANTIBODY, TOTAL: HEP B CORE AB: POSITIVE — AB

## 2023-08-22 LAB — BPAM FFP
Blood Product Expiration Date: 202502142359
ISSUE DATE / TIME: 202502140830
Unit Type and Rh: 6200

## 2023-08-22 LAB — CBC
HCT: 23.7 % — ABNORMAL LOW (ref 36.0–46.0)
Hemoglobin: 7.9 g/dL — ABNORMAL LOW (ref 12.0–15.0)
MCH: 30.7 pg (ref 26.0–34.0)
MCHC: 33.3 g/dL (ref 30.0–36.0)
MCV: 92.2 fL (ref 80.0–100.0)
Platelets: 278 10*3/uL (ref 150–400)
RBC: 2.57 MIL/uL — ABNORMAL LOW (ref 3.87–5.11)
RDW: 23.6 % — ABNORMAL HIGH (ref 11.5–15.5)
WBC: 6.8 10*3/uL (ref 4.0–10.5)
nRBC: 0 % (ref 0.0–0.2)

## 2023-08-22 LAB — PREPARE FRESH FROZEN PLASMA: Unit division: 0

## 2023-08-22 LAB — HEPATITIS B DNA, ULTRAQUANTITATIVE, PCR
HBV DNA SERPL PCR-ACNC: 8340 [IU]/mL
HBV DNA SERPL PCR-LOG IU: 3.921 {Log}

## 2023-08-22 LAB — HEPATITIS B E ANTIGEN: Hep B E Ag: NEGATIVE

## 2023-08-22 LAB — HEPATITIS B E ANTIBODY: Hep B E Ab: REACTIVE — AB

## 2023-08-22 LAB — AMMONIA: Ammonia: 71 umol/L — ABNORMAL HIGH (ref 9–35)

## 2023-08-22 MED ORDER — CAMPHOR-MENTHOL 0.5-0.5 % EX LOTN
TOPICAL_LOTION | CUTANEOUS | Status: DC | PRN
  Filled 2023-08-22: qty 222

## 2023-08-22 MED ORDER — DIPHENHYDRAMINE HCL 25 MG PO CAPS
25.0000 mg | ORAL_CAPSULE | Freq: Four times a day (QID) | ORAL | Status: DC | PRN
  Administered 2023-08-22 (×3): 25 mg via ORAL
  Filled 2023-08-22 (×3): qty 1

## 2023-08-22 MED ORDER — DIPHENHYDRAMINE HCL 25 MG PO CAPS
25.0000 mg | ORAL_CAPSULE | Freq: Once | ORAL | Status: AC
  Administered 2023-08-22: 25 mg via ORAL
  Filled 2023-08-22: qty 1

## 2023-08-22 NOTE — Progress Notes (Addendum)
Subjective: No new complaints   Antibiotics:  Anti-infectives (From admission, onward)    Start     Dose/Rate Route Frequency Ordered Stop   08/21/23 1200  acyclovir (ZOVIRAX) 500 mg in dextrose 5 % 100 mL IVPB  Status:  Discontinued        500 mg 110 mL/hr over 60 Minutes Intravenous Every 8 hours 08/21/23 0429 08/21/23 1024   08/21/23 0430  acyclovir (ZOVIRAX) 500 mg in dextrose 5 % 100 mL IVPB        500 mg 110 mL/hr over 60 Minutes Intravenous Once 08/21/23 0418 08/21/23 0604       Medications: Scheduled Meds:  sodium chloride   Intravenous Once   lactulose  10 g Oral BID   ondansetron (ZOFRAN) IV  4 mg Intravenous Q6H   pantoprazole  40 mg Oral Q0600   Continuous Infusions: PRN Meds:.diphenhydrAMINE, HYDROcodone-acetaminophen, meclizine    Objective: Weight change:   Intake/Output Summary (Last 24 hours) at 08/22/2023 1447 Last data filed at 08/22/2023 0949 Gross per 24 hour  Intake 126 ml  Output --  Net 126 ml   Blood pressure 117/69, pulse 84, temperature 98.5 F (36.9 C), temperature source Oral, resp. rate 18, height 5' (1.524 m), weight 67.6 kg, SpO2 98%. Temp:  [97.9 F (36.6 C)-98.5 F (36.9 C)] 98.5 F (36.9 C) (02/15 0831) Pulse Rate:  [84-95] 84 (02/15 0831) Resp:  [11-18] 18 (02/15 0831) BP: (103-150)/(56-91) 117/69 (02/15 0831) SpO2:  [89 %-99 %] 98 % (02/15 0831)  Physical Exam: Physical Exam Constitutional:      General: She is not in acute distress.    Appearance: She is well-developed. She is not diaphoretic.  HENT:     Head: Normocephalic and atraumatic.     Right Ear: External ear normal.     Left Ear: External ear normal.     Mouth/Throat:     Pharynx: No oropharyngeal exudate.  Eyes:     General: No scleral icterus.    Conjunctiva/sclera: Conjunctivae normal.     Pupils: Pupils are equal, round, and reactive to light.  Cardiovascular:     Rate and Rhythm: Normal rate and regular rhythm.  Pulmonary:     Effort:  Pulmonary effort is normal. No respiratory distress.     Breath sounds: No wheezing.  Abdominal:     General: Bowel sounds are normal. There is no distension.     Palpations: Abdomen is soft.     Tenderness: There is no abdominal tenderness.  Musculoskeletal:        General: No tenderness. Normal range of motion.  Lymphadenopathy:     Cervical: No cervical adenopathy.  Skin:    General: Skin is warm and dry.     Coloration: Skin is not pale.     Findings: No erythema or rash.  Neurological:     General: No focal deficit present.     Mental Status: She is alert and oriented to person, place, and time.     Motor: No abnormal muscle tone.  Psychiatric:        Mood and Affect: Mood normal.        Behavior: Behavior normal.        Thought Content: Thought content normal.        Judgment: Judgment normal.      CBC:    BMET Recent Labs    08/21/23 0614 08/22/23 0623  NA 134* 135  K 3.5 3.6  CL  105 105  CO2 22 19*  GLUCOSE 119* 92  BUN <5* 6  CREATININE 0.57 0.69  CALCIUM 8.7* 8.8*     Liver Panel  Recent Labs    08/21/23 0614 08/22/23 0623  PROT 8.1 7.5  ALBUMIN 2.7* 2.7*  AST 266* 227*  ALT 77* 70*  ALKPHOS 83 75  BILITOT 1.9* 2.0*       Sedimentation Rate No results for input(s): "ESRSEDRATE" in the last 72 hours. C-Reactive Protein No results for input(s): "CRP" in the last 72 hours.  Micro Results: Recent Results (from the past 720 hours)  Culture, blood (Routine X 2) w Reflex to ID Panel     Status: None (Preliminary result)   Collection Time: 08/21/23  8:04 PM   Specimen: BLOOD LEFT ARM  Result Value Ref Range Status   Specimen Description BLOOD LEFT ARM  Final   Special Requests   Final    BOTTLES DRAWN AEROBIC AND ANAEROBIC Blood Culture results may not be optimal due to an inadequate volume of blood received in culture bottles   Culture   Final    NO GROWTH < 12 HOURS Performed at [facility], [address], [place], [place]  [address]    Report Status PENDING  Incomplete  Culture, blood (Routine X 2) w Reflex to ID Panel     Status: None (Preliminary result)   Collection Time: 08/21/23  8:08 PM   Specimen: BLOOD RIGHT ARM  Result Value Ref Range Status   Specimen Description BLOOD RIGHT ARM  Final   Special Requests   Final    BOTTLES DRAWN AEROBIC AND ANAEROBIC Blood Culture results may not be optimal due to an inadequate volume of blood received in culture bottles   Culture   Final    NO GROWTH < 12 HOURS Performed at [facility], [address] [place], [place] [address]    Report Status PENDING  Incomplete    Studies/Results: MR LIVER W WO CONTRAST Result Date: 08/22/2023 CLINICAL DATA:  48 year old female with history of liver lesion concerning for potential hepatocellular carcinoma. Follow-up study. EXAM: MRI ABDOMEN WITHOUT AND WITH CONTRAST TECHNIQUE: Multiplanar multisequence MR imaging of the abdomen was performed both before and after the administration of intravenous contrast. CONTRAST:  10mL GADAVIST GADOBUTROL 1 MMOL/ML IV SOLN COMPARISON:  No prior abdominal MRI. CT of the abdomen and pelvis 08/21/2023. FINDINGS: Lower chest: Elevation of the right hemidiaphragm. Otherwise, unremarkable. Hepatobiliary: Numerous hepatic lesions are noted, with a dominant lesion which replaces much of the right lobe of the liver (axial image 51 of series 15 and coronal image 37 of series 17) estimated to measure approximately 16.5 x 12.7 x 17.7 cm. This lesion is heterogeneous in signal intensity on T1 and T2 weighted images, but clearly has internal areas of hypervascular enhancement on early phase post gadolinium imaging, with persistent low-level enhancement on more delayed imaging. The other dominant lesion is centered in the superior aspect of the liver, likely within the superior aspect of segment 4A (axial image 19 of series 15 and coronal image 53 of series 17) estimated to measure approximately 8.1 x 6.8 x  7.8 cm, with similar imaging characteristics to the previously described lesion, although with greater degree of internal washout and probable pseudo capsule on delayed imaging. Multiple other smaller hypervascular lesions are apparent on arterial phase imaging throughout all aspects of the liver, some of which demonstrate delayed washout. Some amorphous T1 hyperintense material and T2 hypointense material  is noted within the lumen of the gallbladder, likely biliary sludge. Gallbladder is not distended. No pericholecystic fluid or surrounding inflammatory changes. Mild intrahepatic biliary ductal dilatation noted in the left lobe of the liver likely secondary to compression of the central bile ducts from the hepatic masses. Common bile duct is normal in caliber measuring 5 mm in the porta hepatis. Pancreas: No pancreatic mass. No pancreatic ductal dilatation. No pancreatic or peripancreatic fluid collections or inflammatory changes. Spleen:  Unremarkable. Adrenals/Urinary Tract: Small T1 hypointense, T2 hyperintense, nonenhancing lesions in both kidneys compatible with simple (Bosniak class 1) cysts, which require no imaging follow-up. No aggressive appearing renal lesions. No hydroureteronephrosis. Left adrenal gland is unremarkable in appearance. Right adrenal gland is not confidently identified, likely compressed secondary to the adjacent large hepatic mass. Stomach/Bowel: Visualized portions are unremarkable. Vascular/Lymphatic: No aneurysm identified in the visualized abdominal vasculature. No lymphadenopathy noted in the abdomen. Other:  No significant volume of ascites. Musculoskeletal: No aggressive appearing osseous lesions are noted in the visualized portions of the skeleton. IMPRESSION: 1. Multiple aggressive appearing hepatic lesions, as above, with imaging characteristics most suggestive of multifocal hepatocellular carcinoma. 2. Biliary sludge in the gallbladder. No findings to suggest an acute  cholecystitis at this time. 3. Mild intrahepatic biliary ductal dilatation in the left lobe of the liver, likely secondary to compression of the central bile ducts from the hepatic masses. Electronically Signed   By: Trudie Reed M.D.   On: 08/22/2023 09:10   CT ABDOMEN PELVIS W CONTRAST Result Date: 08/21/2023 CLINICAL DATA:  Right side abdominal pain EXAM: CT ABDOMEN AND PELVIS WITH CONTRAST TECHNIQUE: Multidetector CT imaging of the abdomen and pelvis was performed using the standard protocol following bolus administration of intravenous contrast. RADIATION DOSE REDUCTION: This exam was performed according to the departmental dose-optimization program which includes automated exposure control, adjustment of the mA and/or kV according to patient size and/or use of iterative reconstruction technique. CONTRAST:  75mL OMNIPAQUE IOHEXOL 350 MG/ML SOLN COMPARISON:  08/08/2017 FINDINGS: Lower chest: No acute abnormality Hepatobiliary: There is a large mass involving the right hepatic lobe which is new since prior study. This measures 15.5 x 12.5 cm. Areas of central low-density noted within the mass which could reflect scar. The mass is isodense to the remainder the liver on portal venous phase imaging. Possible large feeding vessel noted posteromedially. Gallbladder unremarkable. Pancreas: No focal abnormality or ductal dilatation. Spleen: No focal abnormality.  Normal size. Adrenals/Urinary Tract: No suspicious renal or adrenal lesion. No stones or hydronephrosis. Urinary bladder unremarkable. Stomach/Bowel: Normal appendix. Stomach, large and small bowel grossly unremarkable. Vascular/Lymphatic: No evidence of aneurysm or adenopathy. There is a spontaneous left splenorenal shunt with suggest portal venous hypertension. This may be due to mass effect and compression on the main portal vein from the large right hepatic mass. Reproductive: Uterus and adnexa unremarkable.  No mass. Other: No free fluid or free  air. Musculoskeletal: No acute bony abnormality. IMPRESSION: 15.5 x 12.5 cm solid well-circumscribed mass within the right hepatic lobe. Areas of central low-density suggest possibility of central scar. There may be a large feeding vessel. Favor focal nodular hyperplasia although fibrolamellar HCC can have a similar appearance. Recommend further evaluation MRI without and with contrast. Left spontaneous splenorenal shunt suggest portal venous hypertension. No evidence for cirrhosis. This may be related to mass effect and compression of the portal vein from the large mass. Electronically Signed   By: Charlett Nose M.D.   On: 08/21/2023 01:38   US  Abdomen Limited RUQ (LIVER/GB) Result Date: 08/20/2023 CLINICAL DATA:  Right upper quadrant pain EXAM: ULTRASOUND ABDOMEN LIMITED RIGHT UPPER QUADRANT COMPARISON:  CT 08/08/2017 FINDINGS: Gallbladder: No gallstones or wall thickening visualized. No sonographic Murphy sign noted by sonographer. Common bile duct: Diameter: 2.2 mm Liver: Large heterogeneous solid mass measuring 14.8 x 16.4 x 16.9 cm. Portal vein is patent on color Doppler imaging with possible reversal of flow. Other: None. IMPRESSION: 1. Large heterogeneous solid mass within the liver measuring up to 16.9 cm, suspicious for malignancy. Recommend further evaluation with contrast-enhanced CT or MRI. 2. Possible reversal of flow in the portal vein. Electronically Signed   By: Jasmine Pang M.D.   On: 08/20/2023 19:04      Assessment/Plan:  INTERVAL HISTORY: MRI unfortunately shows what appears to be malignancy   Principal Problem:   Liver mass Active Problems:   Hepatic failure (HCC)   Normocytic anemia   Transaminitis   Disseminated varicella   Metabolic acidosis   Hyperammonemia (HCC)   Chronic viral hepatitis B without delta-agent (HCC)    Christina Gutierrez is a 48 y.o. female with undiagnosed chronic hepatitis B without hepatic coma who possibly had a varicella infection 3  weeks prior to admission though she only had pain for a few days which would be highly unusual with zoster now admitted with right upper quadrant pain and unfortunately on MRI found to have multiple aggressive appearing lesions suggestive of multifocal hepatocellular carcinoma with biliary sludge in the gallbladder.  There is also mild intrahepatic biliary ductal dilatation of the left lobe of the liver likely due to compression of the bile ducts by her malignancy   #1  Multifocal hepatocellular carcinoma:  I told the patient that she would likely need a biopsy to firmly establish the diagnosis  #2 Chronic hepatitis B without hepatic coma;  Expect this with perinatally acquired.  Hep B DNA is pending AST and ALT are 227 and 70.  I think if she undergoes chemotherapy that she should be placed on treatment for her hep B, ideally with Vemlidy  #3 ? VZV: I am skeptical she had this but IF she did she does not have anything to suggest active infectious pathology. SHe has intense pruritus and I expect thsi and the rash are related to her [facility]  I have personally spent 53 minutes involved in face-to-face and non-face-to-face activities for this patient on the day of the visit. Professional time spent includes the following activities: Preparing to see the patient (review of tests), Obtaining and/or reviewing separately obtained history (admission/discharge record), Performing a medically appropriate examination and/or evaluation , Ordering medications/tests/procedures, referring and communicating with other health care professionals, Documenting clinical information in the EMR, Independently interpreting results (not separately reported), Communicating results to the patient/family/caregiver, Counseling and educating the patient/family/caregiver and Care coordination (not separately reported).   Evaluation of the patient requires complex antimicrobial therapy evaluation, counseling , isolation needs to  reduce disease transmission and risk assessment and mitigation.     LOS: 1 day   Acey Lav 08/22/2023, 2:47 PM

## 2023-08-22 NOTE — Progress Notes (Signed)
Subjective: She reports feeling better through the Spanish interpretor.  Objective: Vital signs in last 24 hours: Temp:  [97.9 F (36.6 C)-98.7 F (37.1 C)] 98.5 F (36.9 C) (02/15 0831) Pulse Rate:  [84-104] 84 (02/15 0831) Resp:  [11-21] 18 (02/15 0831) BP: (103-150)/(56-91) 117/69 (02/15 0831) SpO2:  [89 %-99 %] 98 % (02/15 0831) Last BM Date :  (PTA)  Intake/Output from previous day: 02/14 0701 - 02/15 0700 In: 123 [P.O.:120; I.V.:3] Out: -  Intake/Output this shift: Total I/O In: 3 [I.V.:3] Out: -   General appearance: alert and no distress GI: soft, non-tender; bowel sounds normal; no masses,  no organomegaly  Lab Results: Recent Labs    08/20/23 1716 08/21/23 0614 08/22/23 0623  WBC 6.1 6.5 6.8  HGB 8.0* 7.6* 7.9*  HCT 24.4* 23.6* 23.7*  PLT 316 311 278   BMET Recent Labs    08/20/23 1716 08/21/23 0614 08/22/23 0623  NA 137 134* 135  K 4.0 3.5 3.6  CL 106 105 105  CO2 21* 22 19*  GLUCOSE 103* 119* 92  BUN 5* <5* 6  CREATININE 0.57 0.57 0.69  CALCIUM 9.0 8.7* 8.8*   LFT Recent Labs    08/22/23 0623  PROT 7.5  ALBUMIN 2.7*  AST 227*  ALT 70*  ALKPHOS 75  BILITOT 2.0*   PT/INR Recent Labs    08/21/23 0106 08/21/23 0614  LABPROT 17.7* 18.0*  INR 1.4* 1.5*   Hepatitis Panel Recent Labs    08/21/23 0108  HEPBSAG Reactive*  HCVAB NON REACTIVE  HEPAIGM NON REACTIVE  HEPBIGM Reactive*   C-Diff No results for input(s): "CDIFFTOX" in the last 72 hours. Fecal Lactopherrin No results for input(s): "FECLLACTOFRN" in the last 72 hours.  Studies/Results: MR LIVER W WO CONTRAST Result Date: 08/22/2023 CLINICAL DATA:  48 year old female with history of liver lesion concerning for potential hepatocellular carcinoma. Follow-up study. EXAM: MRI ABDOMEN WITHOUT AND WITH CONTRAST TECHNIQUE: Multiplanar multisequence MR imaging of the abdomen was performed both before and after the administration of intravenous contrast. CONTRAST:  10mL GADAVIST  GADOBUTROL 1 MMOL/ML IV SOLN COMPARISON:  No prior abdominal MRI. CT of the abdomen and pelvis 08/21/2023. FINDINGS: Lower chest: Elevation of the right hemidiaphragm. Otherwise, unremarkable. Hepatobiliary: Numerous hepatic lesions are noted, with a dominant lesion which replaces much of the right lobe of the liver (axial image 51 of series 15 and coronal image 37 of series 17) estimated to measure approximately 16.5 x 12.7 x 17.7 cm. This lesion is heterogeneous in signal intensity on T1 and T2 weighted images, but clearly has internal areas of hypervascular enhancement on early phase post gadolinium imaging, with persistent low-level enhancement on more delayed imaging. The other dominant lesion is centered in the superior aspect of the liver, likely within the superior aspect of segment 4A (axial image 19 of series 15 and coronal image 53 of series 17) estimated to measure approximately 8.1 x 6.8 x 7.8 cm, with similar imaging characteristics to the previously described lesion, although with greater degree of internal washout and probable pseudo capsule on delayed imaging. Multiple other smaller hypervascular lesions are apparent on arterial phase imaging throughout all aspects of the liver, some of which demonstrate delayed washout. Some amorphous T1 hyperintense material and T2 hypointense material is noted within the lumen of the gallbladder, likely biliary sludge. Gallbladder is not distended. No pericholecystic fluid or surrounding inflammatory changes. Mild intrahepatic biliary ductal dilatation noted in the left lobe of the liver likely secondary to compression of  the central bile ducts from the hepatic masses. Common bile duct is normal in caliber measuring 5 mm in the porta hepatis. Pancreas: No pancreatic mass. No pancreatic ductal dilatation. No pancreatic or peripancreatic fluid collections or inflammatory changes. Spleen:  Unremarkable. Adrenals/Urinary Tract: Small T1 hypointense, T2 hyperintense,  nonenhancing lesions in both kidneys compatible with simple (Bosniak class 1) cysts, which require no imaging follow-up. No aggressive appearing renal lesions. No hydroureteronephrosis. Left adrenal gland is unremarkable in appearance. Right adrenal gland is not confidently identified, likely compressed secondary to the adjacent large hepatic mass. Stomach/Bowel: Visualized portions are unremarkable. Vascular/Lymphatic: No aneurysm identified in the visualized abdominal vasculature. No lymphadenopathy noted in the abdomen. Other:  No significant volume of ascites. Musculoskeletal: No aggressive appearing osseous lesions are noted in the visualized portions of the skeleton. IMPRESSION: 1. Multiple aggressive appearing hepatic lesions, as above, with imaging characteristics most suggestive of multifocal hepatocellular carcinoma. 2. Biliary sludge in the gallbladder. No findings to suggest an acute cholecystitis at this time. 3. Mild intrahepatic biliary ductal dilatation in the left lobe of the liver, likely secondary to compression of the central bile ducts from the hepatic masses. Electronically Signed   By: Trudie Reed M.D.   On: 08/22/2023 09:10   CT ABDOMEN PELVIS W CONTRAST Result Date: 08/21/2023 CLINICAL DATA:  Right side abdominal pain EXAM: CT ABDOMEN AND PELVIS WITH CONTRAST TECHNIQUE: Multidetector CT imaging of the abdomen and pelvis was performed using the standard protocol following bolus administration of intravenous contrast. RADIATION DOSE REDUCTION: This exam was performed according to the departmental dose-optimization program which includes automated exposure control, adjustment of the mA and/or kV according to patient size and/or use of iterative reconstruction technique. CONTRAST:  75mL OMNIPAQUE IOHEXOL 350 MG/ML SOLN COMPARISON:  08/08/2017 FINDINGS: Lower chest: No acute abnormality Hepatobiliary: There is a large mass involving the right hepatic lobe which is new since prior study.  This measures 15.5 x 12.5 cm. Areas of central low-density noted within the mass which could reflect scar. The mass is isodense to the remainder the liver on portal venous phase imaging. Possible large feeding vessel noted posteromedially. Gallbladder unremarkable. Pancreas: No focal abnormality or ductal dilatation. Spleen: No focal abnormality.  Normal size. Adrenals/Urinary Tract: No suspicious renal or adrenal lesion. No stones or hydronephrosis. Urinary bladder unremarkable. Stomach/Bowel: Normal appendix. Stomach, large and small bowel grossly unremarkable. Vascular/Lymphatic: No evidence of aneurysm or adenopathy. There is a spontaneous left splenorenal shunt with suggest portal venous hypertension. This may be due to mass effect and compression on the main portal vein from the large right hepatic mass. Reproductive: Uterus and adnexa unremarkable.  No mass. Other: No free fluid or free air. Musculoskeletal: No acute bony abnormality. IMPRESSION: 15.5 x 12.5 cm solid well-circumscribed mass within the right hepatic lobe. Areas of central low-density suggest possibility of central scar. There may be a large feeding vessel. Favor focal nodular hyperplasia although fibrolamellar HCC can have a similar appearance. Recommend further evaluation MRI without and with contrast. Left spontaneous splenorenal shunt suggest portal venous hypertension. No evidence for cirrhosis. This may be related to mass effect and compression of the portal vein from the large mass. Electronically Signed   By: Charlett Nose M.D.   On: 08/21/2023 01:38   US Abdomen Limited RUQ (LIVER/GB) Result Date: 08/20/2023 CLINICAL DATA:  Right upper quadrant pain EXAM: ULTRASOUND ABDOMEN LIMITED RIGHT UPPER QUADRANT COMPARISON:  CT 08/08/2017 FINDINGS: Gallbladder: No gallstones or wall thickening visualized. No sonographic Murphy sign noted by sonographer.  Common bile duct: Diameter: 2.2 mm Liver: Large heterogeneous solid mass measuring 14.8 x  16.4 x 16.9 cm. Portal vein is patent on color Doppler imaging with possible reversal of flow. Other: None. IMPRESSION: 1. Large heterogeneous solid mass within the liver measuring up to 16.9 cm, suspicious for malignancy. Recommend further evaluation with contrast-enhanced CT or MRI. 2. Possible reversal of flow in the portal vein. Electronically Signed   By: Jasmine Pang M.D.   On: 08/20/2023 19:04    Medications: Scheduled:  sodium chloride   Intravenous Once   lactulose  10 g Oral BID   ondansetron (ZOFRAN) IV  4 mg Intravenous Q6H   pantoprazole  40 mg Oral Q0600   sodium chloride flush  3 mL Intravenous Q12H   Continuous:  Assessment/Plan: 1) Multifocal HCC. 2) Chronic HBV (HBeAb+).   This is an unfortunate finding.  With the given findings she is not a transplantation candidate.  Her HBV DNA level is pending as well as the AFP.  If her HBV DNA is >2000 International units/ml she had a precore mutation.  It is not clear if she will truly benefit with suppressive treatment for her HBV with the severity of her disease.  Plan: 1) Oncology consultation. 2) Await HBV DNA and AFP.  LOS: 1 day   Robel Wuertz D 08/22/2023, 10:02 AM

## 2023-08-22 NOTE — Plan of Care (Signed)

## 2023-08-22 NOTE — Progress Notes (Signed)
PROGRESS NOTE    Christina Gutierrez  UEA:[id number] DOB: 02/27/1976 DOA: 08/20/2023 PCP: Pcp, No    Brief Narrative:  48 year old with no significant medical history presented with right upper quadrant abdominal pain for about 1 week, poor oral intake, nausea and vomiting.  She had also noticed exanthematous rashes that family diagnosed as chickenpox about 20 days ago.  She had itchy skin rashes that has already been healed and improved.  She also complained of epistaxis.  In the emergency room, hemodynamically stable.  WBC count 6.1.  Hemoglobin 8.  Hematocrit 24.  Platelet 316.  AST and ALT mildly elevated.  Lipase 47.  INR mildly elevated.  Ammonia 62.  HIV negative.  Hepatitis panel positive for IgM antibody, surface antigen positive.  Liver ultrasound and subsequent CT scan with 15 cm well-circumscribed central low-density mass right hepatic lobe.  MRI consistent with multifocal hepatocellular carcinoma.  Subjective: Patient seen and examined.  Husband and son at the bedside.  Patient denied any complaints to me.  Denied any nausea vomiting.  Denied any complaints of abdominal pain today. Used a Radiation protection practitioner at the bedside, husband present and discussed about MRI findings.  Explained to them about possible diagnosis of liver cancer and ongoing other confirmatory tests.  Patient was appropriately tearful but she was grateful for explanation. Will consult oncology further recommendation whether patient needs liver biopsy before treatment options.  Assessment & Plan:   Primary hepatocellular carcinoma, mild compressive symptoms and portal hypertension: Mildly abnormal LFTs.  Coagulopathy with no active bleeding. Chronic hepatitis B infection.  Hepatitis B virus load pending.  MRI consistent with multiple aggressive appearing hepatic lesions, multifocal hepatocellular carcinoma. Not sure patient needs a tissue biopsy, will discuss with oncology.  Currently continue supportive  care. Apparently not a candidate for liver transplant.  Likely chemotherapy. Case discussed with oncology, recommended CT chest.  Chronic anemia, colopathy: Currently no evidence of active bleeding.  Monitor.  Suspected varicella-zoster: Seen by ID.  Currently no indication for treatment.  No isolation needed.     DVT prophylaxis: SCDs Start: 08/21/23 0401 Place TED hose Start: 08/21/23 0401   Code Status: Full code Family Communication: Husband at the bedside Disposition Plan: Status is: Inpatient Remains inpatient appropriate because: New diagnosis of cancer, investigations needed     Consultants:  Gastroenterology Infectious disease Oncology  Procedures:  None  Antimicrobials:  None     Objective: Vitals:   08/21/23 2103 08/22/23 0012 08/22/23 0512 08/22/23 0831  BP: 128/72 133/75 (!) 103/56 117/69  Pulse: 90 92 84 84  Resp: 18 18 18 18   Temp: 98.4 F (36.9 C) 97.9 F (36.6 C) 98.5 F (36.9 C) 98.5 F (36.9 C)  TempSrc: Oral Oral Oral Oral  SpO2: 99% 99% 96% 98%  Weight:      Height:        Intake/Output Summary (Last 24 hours) at 08/22/2023 1310 Last data filed at 08/22/2023 0949 Gross per 24 hour  Intake 126 ml  Output --  Net 126 ml   Filed Weights   08/20/23 1716  Weight: 67.6 kg    Examination:  General exam: Appears calm and comfortable. She has some dry skin rashes.  Respiratory system: Clear to auscultation. Respiratory effort normal. Cardiovascular system: S1 & S2 heard, RRR.  Gastrointestinal system: Soft.  Nontender.  Firm palpable liver right subcostal region. Central nervous system: Alert and oriented. No focal neurological deficits. Extremities: Symmetric 5 x 5 power. Skin: No rashes, lesions or ulcers Psychiatry: Judgement  and insight appear normal. Mood & affect appropriate.     Data Reviewed: I have personally reviewed following labs and imaging studies  CBC: Recent Labs  Lab 08/20/23 1716 08/21/23 0614  08/22/23 0623  WBC 6.1 6.5 6.8  NEUTROABS 3.7  --   --   HGB 8.0* 7.6* 7.9*  HCT 24.4* 23.6* 23.7*  MCV 92.1 92.5 92.2  PLT 316 311 278   Basic Metabolic Panel: Recent Labs  Lab 08/20/23 1716 08/21/23 0614 08/22/23 0623  NA 137 134* 135  K 4.0 3.5 3.6  CL 106 105 105  CO2 21* 22 19*  GLUCOSE 103* 119* 92  BUN 5* <5* 6  CREATININE 0.57 0.57 0.69  CALCIUM 9.0 8.7* 8.8*   GFR: Estimated Creatinine Clearance: 75.3 mL/min (by C-G formula based on SCr of 0.69 mg/dL). Liver Function Tests: Recent Labs  Lab 08/20/23 1716 08/21/23 0614 08/22/23 0623  AST 280* 266* 227*  ALT 79* 77* 70*  ALKPHOS 100 83 75  BILITOT 1.8* 1.9* 2.0*  PROT 8.2* 8.1 7.5  ALBUMIN 2.7* 2.7* 2.7*   Recent Labs  Lab 08/20/23 1716  LIPASE 47   Recent Labs  Lab 08/21/23 0106 08/21/23 0614 08/22/23 0623  AMMONIA 62* 66* 71*   Coagulation Profile: Recent Labs  Lab 08/21/23 0106 08/21/23 0614  INR 1.4* 1.5*   Cardiac Enzymes: No results for input(s): "CKTOTAL", "CKMB", "CKMBINDEX", "TROPONINI" in the last 168 hours. BNP (last 3 results) No results for input(s): "PROBNP" in the last 8760 hours. HbA1C: No results for input(s): "HGBA1C" in the last 72 hours. CBG: No results for input(s): "GLUCAP" in the last 168 hours. Lipid Profile: No results for input(s): "CHOL", "HDL", "LDLCALC", "TRIG", "CHOLHDL", "LDLDIRECT" in the last 72 hours. Thyroid Function Tests: No results for input(s): "TSH", "T4TOTAL", "FREET4", "T3FREE", "THYROIDAB" in the last 72 hours. Anemia Panel: Recent Labs    08/21/23 1804  VITAMINB12 3,410*  FOLATE 7.8  FERRITIN 168  TIBC 483*  IRON 99  RETICCTPCT 3.1   Sepsis Labs: No results for input(s): "PROCALCITON", "LATICACIDVEN" in the last 168 hours.  Recent Results (from the past 240 hours)  Culture, blood (Routine X 2) w Reflex to ID Panel     Status: None (Preliminary result)   Collection Time: 08/21/23  8:04 PM   Specimen: BLOOD LEFT ARM  Result Value  Ref Range Status   Specimen Description BLOOD LEFT ARM  Final   Special Requests   Final    BOTTLES DRAWN AEROBIC AND ANAEROBIC Blood Culture results may not be optimal due to an inadequate volume of blood received in culture bottles   Culture   Final    NO GROWTH < 12 HOURS Performed at [facility], [address], [place], [place] [address]    Report Status PENDING  Incomplete  Culture, blood (Routine X 2) w Reflex to ID Panel     Status: None (Preliminary result)   Collection Time: 08/21/23  8:08 PM   Specimen: BLOOD RIGHT ARM  Result Value Ref Range Status   Specimen Description BLOOD RIGHT ARM  Final   Special Requests   Final    BOTTLES DRAWN AEROBIC AND ANAEROBIC Blood Culture results may not be optimal due to an inadequate volume of blood received in culture bottles   Culture   Final    NO GROWTH < 12 HOURS Performed at [facility], [address], [place], [place] [address]    Report Status PENDING  Incomplete  Radiology Studies: MR LIVER W WO CONTRAST Result Date: 08/22/2023 CLINICAL DATA:  48 year old female with history of liver lesion concerning for potential hepatocellular carcinoma. Follow-up study. EXAM: MRI ABDOMEN WITHOUT AND WITH CONTRAST TECHNIQUE: Multiplanar multisequence MR imaging of the abdomen was performed both before and after the administration of intravenous contrast. CONTRAST:  10mL GADAVIST GADOBUTROL 1 MMOL/ML IV SOLN COMPARISON:  No prior abdominal MRI. CT of the abdomen and pelvis 08/21/2023. FINDINGS: Lower chest: Elevation of the right hemidiaphragm. Otherwise, unremarkable. Hepatobiliary: Numerous hepatic lesions are noted, with a dominant lesion which replaces much of the right lobe of the liver (axial image 51 of series 15 and coronal image 37 of series 17) estimated to measure approximately 16.5 x 12.7 x 17.7 cm. This lesion is heterogeneous in signal intensity on T1 and T2 weighted images, but clearly has internal areas of  hypervascular enhancement on early phase post gadolinium imaging, with persistent low-level enhancement on more delayed imaging. The other dominant lesion is centered in the superior aspect of the liver, likely within the superior aspect of segment 4A (axial image 19 of series 15 and coronal image 53 of series 17) estimated to measure approximately 8.1 x 6.8 x 7.8 cm, with similar imaging characteristics to the previously described lesion, although with greater degree of internal washout and probable pseudo capsule on delayed imaging. Multiple other smaller hypervascular lesions are apparent on arterial phase imaging throughout all aspects of the liver, some of which demonstrate delayed washout. Some amorphous T1 hyperintense material and T2 hypointense material is noted within the lumen of the gallbladder, likely biliary sludge. Gallbladder is not distended. No pericholecystic fluid or surrounding inflammatory changes. Mild intrahepatic biliary ductal dilatation noted in the left lobe of the liver likely secondary to compression of the central bile ducts from the hepatic masses. Common bile duct is normal in caliber measuring 5 mm in the porta hepatis. Pancreas: No pancreatic mass. No pancreatic ductal dilatation. No pancreatic or peripancreatic fluid collections or inflammatory changes. Spleen:  Unremarkable. Adrenals/Urinary Tract: Small T1 hypointense, T2 hyperintense, nonenhancing lesions in both kidneys compatible with simple (Bosniak class 1) cysts, which require no imaging follow-up. No aggressive appearing renal lesions. No hydroureteronephrosis. Left adrenal gland is unremarkable in appearance. Right adrenal gland is not confidently identified, likely compressed secondary to the adjacent large hepatic mass. Stomach/Bowel: Visualized portions are unremarkable. Vascular/Lymphatic: No aneurysm identified in the visualized abdominal vasculature. No lymphadenopathy noted in the abdomen. Other:  No significant  volume of ascites. Musculoskeletal: No aggressive appearing osseous lesions are noted in the visualized portions of the skeleton. IMPRESSION: 1. Multiple aggressive appearing hepatic lesions, as above, with imaging characteristics most suggestive of multifocal hepatocellular carcinoma. 2. Biliary sludge in the gallbladder. No findings to suggest an acute cholecystitis at this time. 3. Mild intrahepatic biliary ductal dilatation in the left lobe of the liver, likely secondary to compression of the central bile ducts from the hepatic masses. Electronically Signed   By: Trudie Reed M.D.   On: 08/22/2023 09:10   CT ABDOMEN PELVIS W CONTRAST Result Date: 08/21/2023 CLINICAL DATA:  Right side abdominal pain EXAM: CT ABDOMEN AND PELVIS WITH CONTRAST TECHNIQUE: Multidetector CT imaging of the abdomen and pelvis was performed using the standard protocol following bolus administration of intravenous contrast. RADIATION DOSE REDUCTION: This exam was performed according to the departmental dose-optimization program which includes automated exposure control, adjustment of the mA and/or kV according to patient size and/or use of iterative reconstruction technique. CONTRAST:  75mL OMNIPAQUE IOHEXOL  350 MG/ML SOLN COMPARISON:  08/08/2017 FINDINGS: Lower chest: No acute abnormality Hepatobiliary: There is a large mass involving the right hepatic lobe which is new since prior study. This measures 15.5 x 12.5 cm. Areas of central low-density noted within the mass which could reflect scar. The mass is isodense to the remainder the liver on portal venous phase imaging. Possible large feeding vessel noted posteromedially. Gallbladder unremarkable. Pancreas: No focal abnormality or ductal dilatation. Spleen: No focal abnormality.  Normal size. Adrenals/Urinary Tract: No suspicious renal or adrenal lesion. No stones or hydronephrosis. Urinary bladder unremarkable. Stomach/Bowel: Normal appendix. Stomach, large and small bowel  grossly unremarkable. Vascular/Lymphatic: No evidence of aneurysm or adenopathy. There is a spontaneous left splenorenal shunt with suggest portal venous hypertension. This may be due to mass effect and compression on the main portal vein from the large right hepatic mass. Reproductive: Uterus and adnexa unremarkable.  No mass. Other: No free fluid or free air. Musculoskeletal: No acute bony abnormality. IMPRESSION: 15.5 x 12.5 cm solid well-circumscribed mass within the right hepatic lobe. Areas of central low-density suggest possibility of central scar. There may be a large feeding vessel. Favor focal nodular hyperplasia although fibrolamellar HCC can have a similar appearance. Recommend further evaluation MRI without and with contrast. Left spontaneous splenorenal shunt suggest portal venous hypertension. No evidence for cirrhosis. This may be related to mass effect and compression of the portal vein from the large mass. Electronically Signed   By: Charlett Nose M.D.   On: 08/21/2023 01:38   US Abdomen Limited RUQ (LIVER/GB) Result Date: 08/20/2023 CLINICAL DATA:  Right upper quadrant pain EXAM: ULTRASOUND ABDOMEN LIMITED RIGHT UPPER QUADRANT COMPARISON:  CT 08/08/2017 FINDINGS: Gallbladder: No gallstones or wall thickening visualized. No sonographic Murphy sign noted by sonographer. Common bile duct: Diameter: 2.2 mm Liver: Large heterogeneous solid mass measuring 14.8 x 16.4 x 16.9 cm. Portal vein is patent on color Doppler imaging with possible reversal of flow. Other: None. IMPRESSION: 1. Large heterogeneous solid mass within the liver measuring up to 16.9 cm, suspicious for malignancy. Recommend further evaluation with contrast-enhanced CT or MRI. 2. Possible reversal of flow in the portal vein. Electronically Signed   By: Jasmine Pang M.D.   On: 08/20/2023 19:04        Scheduled Meds:  sodium chloride   Intravenous Once   lactulose  10 g Oral BID   ondansetron (ZOFRAN) IV  4 mg Intravenous Q6H    pantoprazole  40 mg Oral Q0600   sodium chloride flush  3 mL Intravenous Q12H   Continuous Infusions:   LOS: 1 day    Time spent: 35 minutes     Dorcas Carrow, MD [facility]

## 2023-08-22 NOTE — Plan of Care (Signed)

## 2023-08-23 ENCOUNTER — Inpatient Hospital Stay (HOSPITAL_COMMUNITY): Payer: Self-pay

## 2023-08-23 DIAGNOSIS — K766 Portal hypertension: Secondary | ICD-10-CM

## 2023-08-23 DIAGNOSIS — R52 Pain, unspecified: Secondary | ICD-10-CM

## 2023-08-23 DIAGNOSIS — C220 Liver cell carcinoma: Secondary | ICD-10-CM

## 2023-08-23 LAB — VARICELLA ZOSTER ANTIBODY, IGG: Varicella IgG: REACTIVE

## 2023-08-23 LAB — COMPREHENSIVE METABOLIC PANEL
ALT: 67 U/L — ABNORMAL HIGH (ref 0–44)
AST: 209 U/L — ABNORMAL HIGH (ref 15–41)
Albumin: 2.5 g/dL — ABNORMAL LOW (ref 3.5–5.0)
Alkaline Phosphatase: 79 U/L (ref 38–126)
Anion gap: 11 (ref 5–15)
BUN: <5 mg/dL — ABNORMAL LOW (ref 6–20)
CO2: 24 mmol/L (ref 22–32)
Calcium: 9 mg/dL (ref 8.9–10.3)
Chloride: 101 mmol/L (ref 98–111)
Creatinine, Ser: 0.7 mg/dL (ref 0.44–1.00)
GFR, Estimated: >60 mL/min (ref >60)
Glucose, Bld: 78 mg/dL (ref 70–99)
Potassium: 3.8 mmol/L (ref 3.5–5.1)
Sodium: 136 mmol/L (ref 135–145)
Total Bilirubin: 1.9 mg/dL — ABNORMAL HIGH (ref 0.0–1.2)
Total Protein: 7.9 g/dL (ref 6.5–8.1)

## 2023-08-23 LAB — CBC
HCT: 23.6 % — ABNORMAL LOW (ref 36.0–46.0)
Hemoglobin: 7.7 g/dL — ABNORMAL LOW (ref 12.0–15.0)
MCH: 30.6 pg (ref 26.0–34.0)
MCHC: 32.6 g/dL (ref 30.0–36.0)
MCV: 93.7 fL (ref 80.0–100.0)
Platelets: 279 10*3/uL (ref 150–400)
RBC: 2.52 MIL/uL — ABNORMAL LOW (ref 3.87–5.11)
RDW: 22.5 % — ABNORMAL HIGH (ref 11.5–15.5)
WBC: 6.3 10*3/uL (ref 4.0–10.5)
nRBC: 0 % (ref 0.0–0.2)

## 2023-08-23 LAB — AMMONIA: Ammonia: 64 umol/L — ABNORMAL HIGH (ref 9–35)

## 2023-08-23 LAB — AFP TUMOR MARKER: AFP, Serum, Tumor Marker: 32160 ng/mL — ABNORMAL HIGH (ref 0.0–6.4)

## 2023-08-23 MED ORDER — TENOFOVIR ALAFENAMIDE FUMARATE 25 MG PO TABS
25.0000 mg | ORAL_TABLET | Freq: Every day | ORAL | Status: DC
  Filled 2023-08-23: qty 1

## 2023-08-23 MED ORDER — POLYETHYLENE GLYCOL 3350 17 G PO PACK
17.0000 g | PACK | Freq: Every day | ORAL | Status: DC
  Administered 2023-08-23: 17 g via ORAL
  Filled 2023-08-23: qty 1

## 2023-08-23 MED ORDER — TENOFOVIR ALAFENAMIDE FUMARATE 25 MG PO TABS: 25.0000 mg | ORAL_TABLET | Freq: Every day | ORAL | 0 refills | Status: DC

## 2023-08-23 MED ORDER — POLYETHYLENE GLYCOL 3350 17 G PO PACK: 17.0000 g | PACK | Freq: Every day | ORAL | 0 refills | Status: DC

## 2023-08-23 MED ORDER — IOHEXOL 350 MG/ML SOLN
50.0000 mL | Freq: Once | INTRAVENOUS | Status: AC | PRN
  Administered 2023-08-23: 50 mL via INTRAVENOUS

## 2023-08-23 MED ORDER — PANTOPRAZOLE SODIUM 40 MG PO TBEC: 40.0000 mg | DELAYED_RELEASE_TABLET | Freq: Every day | ORAL | 0 refills | Status: DC

## 2023-08-23 MED ORDER — DIPHENHYDRAMINE HCL 25 MG PO CAPS: 25.0000 mg | ORAL_CAPSULE | Freq: Four times a day (QID) | ORAL | 0 refills | Status: DC | PRN

## 2023-08-23 MED ORDER — HYDROCODONE-ACETAMINOPHEN 5-325 MG PO TABS
1.0000 | ORAL_TABLET | ORAL | 0 refills | Status: DC | PRN
Start: 2023-08-23 — End: 2023-08-28

## 2023-08-23 NOTE — Consult Note (Signed)
[facility]  [phone]   HEMATOLOGY/ONCOLOGY IN-PATIENT CONSULTATION NOTE   PATIENT NAME: Christina Gutierrez   MR#: [id number] DOB: 09-05-1975 CSN#: [id number]   DATE OF SERVICE: 08/23/2023  Requesting Physician: Dorcas Carrow, MD  Patient Care Team: Pcp, No as PCP - General  REASON FOR CONSULTATION:  MRI abdomen concerning for hepatocellular carcinoma  ASSESSMENT & PLAN:  Multifocal hepatocellular carcinoma  -Patient presented with abdominal pain.  LFTs noted to be elevated.  Imaging studies including ultrasound followed by CT abdomen and pelvis followed by MRI of the liver protocol were obtained.  MRI liver is indicative of multifocal hepatocellular carcinoma with delayed washout in the lesions. -Given classic features of hepatocellular carcinoma on MRI, we can skip biopsy of the liver lesions. -AFP pending -Previously undiagnosed hepatitis B could have been the contributing factor. -Not a candidate for liver transplant or surgical resection because of the extent of disease. -Discussed diagnosis, prognosis, plan of care, treatment options.  Reviewed NCCN guidelines. -Child Pugh class B, score of 7 at least. -Will plan to treat her with immunotherapy using tremelimumab plus durvalumab regimen in the outpatient setting.  I will plan to see her on 08/27/2023 in clinic for continuation of care.  Please provide her with adequate pain medication prescription.  Rest of care as per primary team and other specialties.  Thanks for the opportunity to participate in the care of this patient. Please contact me if there are any questions.   Meryl Crutch, MD Medical Oncology and Hematology 08/23/2023 1:13 PM   HISTORY OF PRESENT ILLNESS:  Christina Gutierrez is a 48 y.o. lady with no significant past medical history, presented to the emergency department on 08/21/2023 with complaints of right-sided abdominal pain, nausea, vomiting and  decreased oral intake for at least 10 days prior to arrival.  On arrival to ED, she was found to have anemia with hemoglobin of 8, white count normal at 6100, platelet count 316,000.  CMP showed elevated AST of 80, ALT increased at 79, bilirubin elevated at 1.8.  Elevated ammonia level of 62.  Right upper quadrant ultrasound showed large heterogenoussolid mass within the liver measuring up to 16.9 cm, suspicious for malignancy. Recommend further evaluation with contrast-enhanced CT or MRI.   CT abdomen pelvis showed: 15.5 x 12.5 cm solid well-circumscribed mass within the right hepatic lobe. Areas of central low-density suggest possibility of central scar. There may be a large feeding vessel. Favor focal nodular hyperplasia although fibrolamellar HCC can have a similar appearance. Recommend further evaluation MRI without and with contrast.   Left spontaneous splenorenal shunt suggest portal venous hypertension. No evidence for cirrhosis. This may be related to mass effect and compression of the portal vein from the large mass.  She was recently diagnosed with chickenpox about 20 days ago.  Still had some itching from vesicles.  She was admitted for further evaluation and management.  MRI of the abdomen with liver protocol on 08/21/2023 showed numerous hepatic lesions are noted, with a dominant lesion which replaces much of the right lobe of the liver estimated to measure approximately 16.5 x 12.7 x 17.7 cm. This lesion is heterogeneous in signal intensity on T1 and T2 weighted images, but clearly has internal areas of hypervascular enhancement on early phase post gadolinium imaging, with persistent low-level enhancement on more delayed imaging. The other dominant lesion is centered in the superior aspect of the liver, likely within the superior aspect of segment 4A estimated to measure approximately 8.1 x  6.8 x 7.8 cm, with similar imaging characteristics to the previously described lesion, although  with greater degree of internal washout and probable pseudo capsule on delayed imaging. Multiple other smaller hypervascular lesions are apparent on arterial phase imaging throughout all aspects of the liver, some of which demonstrate delayed washout.  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  AFP pending.  She was also diagnosed with hepatitis B infection on additional workup.  CT chest on 08/23/2023 showed no evidence of intrathoracic metastatic disease.  Patient seen and evaluated.  Her family members were by the bedside.  We used interpreter services for translation.  Her nosebleeds have resolved.  Has abdominal discomfort still.  Skin rash is improving.  Denies any blood in stools or black-colored stools.  MEDICAL HISTORY History reviewed. No pertinent past medical history.   SURGICAL HISTORY Past Surgical History:  Procedure Laterality Date   CESAREAN SECTION       ALLERGIES  No Known Allergies  FAMILY HISTORY  History reviewed. No pertinent family history.   SOCIAL HISTORY   Social History   Socioeconomic History   Marital status: Married    Spouse name: Not on file   Number of children: Not on file   Years of education: Not on file   Highest education level: Not on file  Occupational History   Not on file  Tobacco Use   Smoking status: Never   Smokeless tobacco: Never  Vaping Use   Vaping status: Never Used  Substance and Sexual Activity   Alcohol use: No   Drug use: No   Sexual activity: Not on file  Other Topics Concern   Not on file  Social History Narrative   Not on file   Social Drivers of Health   Financial Resource Strain: Not on file  Food Insecurity: No Food Insecurity (08/21/2023)   Hunger Vital Sign    Worried About Running Out of Food in the Last Year: Never true    Ran Out of Food in the Last Year: Never true  Transportation Needs: No Transportation Needs (08/21/2023)   PRAPARE - Scientist, research (physical sciences) (Medical): No    Lack of Transportation (Non-Medical): No  Physical Activity: Not on file  Stress: Not on file  Social Connections: Not on file  Intimate Partner Violence: Not At Risk (08/21/2023)   Humiliation, Afraid, Rape, and Kick questionnaire    Fear of Current or Ex-Partner: No    Emotionally Abused: No    Physically Abused: No    Sexually Abused: No    CURRENT MEDICATIONS   No current outpatient medications   REVIEW OF SYSTEMS   Review of Systems - Oncology  All other pertinent review of systems is negative except as mentioned above in HPI  PHYSICAL EXAMINATION  ECOG PERFORMANCE STATUS: 2 - Symptomatic, <50% confined to bed  Vitals:   08/23/23 0519 08/23/23 0745  BP: 122/78 131/75  Pulse: 82 89  Resp: 17 17  Temp: 98.2 F (36.8 C) 98.9 F (37.2 C)  SpO2: 96% 96%   Filed Weights   08/20/23 1716  Weight: 149 lb (67.6 kg)    Physical Exam Constitutional:      General: She is not in acute distress.    Appearance: Normal appearance.  HENT:     Head: Normocephalic and atraumatic.  Eyes:     General: No scleral icterus.    Conjunctiva/sclera: Conjunctivae normal.  Cardiovascular:     Rate and Rhythm: Normal rate  and regular rhythm.     Heart sounds: Normal heart sounds.  Pulmonary:     Effort: Pulmonary effort is normal.     Breath sounds: Normal breath sounds.  Abdominal:     General: There is no distension.     Palpations: There is hepatomegaly.     Tenderness: There is abdominal tenderness.  Musculoskeletal:     Right lower leg: No edema.     Left lower leg: No edema.  Skin:    Findings: Rash present.  Neurological:     General: No focal deficit present.     Mental Status: She is alert and oriented to person, place, and time.  Psychiatric:        Mood and Affect: Mood normal.        Behavior: Behavior normal.        Thought Content: Thought content normal.     LABORATORY DATA:   I have reviewed the data as listed  Results  for orders placed or performed during the hospital encounter of 08/20/23 (from the past 24 hours)  CBC   Collection Time: 08/23/23  4:54 AM  Result Value Ref Range   WBC 6.3 4.0 - 10.5 K/uL   RBC 2.52 (L) 3.87 - 5.11 MIL/uL   Hemoglobin 7.7 (L) 12.0 - 15.0 g/dL   HCT 57.8 (L) 46.9 - 62.9 %   MCV 93.7 80.0 - 100.0 fL   MCH 30.6 26.0 - 34.0 pg   MCHC 32.6 30.0 - 36.0 g/dL   RDW 52.8 (H) 41.3 - 24.4 %   Platelets 279 150 - 400 K/uL   nRBC 0.0 0.0 - 0.2 %  Comprehensive metabolic panel   Collection Time: 08/23/23  4:54 AM  Result Value Ref Range   Sodium 136 135 - 145 mmol/L   Potassium 3.8 3.5 - 5.1 mmol/L   Chloride 101 98 - 111 mmol/L   CO2 24 22 - 32 mmol/L   Glucose, Bld 78 70 - 99 mg/dL   BUN <5 (L) 6 - 20 mg/dL   Creatinine, Ser 0.10 0.44 - 1.00 mg/dL   Calcium 9.0 8.9 - 27.2 mg/dL   Total Protein 7.9 6.5 - 8.1 g/dL   Albumin 2.5 (L) 3.5 - 5.0 g/dL   AST 536 (H) 15 - 41 U/L   ALT 67 (H) 0 - 44 U/L   Alkaline Phosphatase 79 38 - 126 U/L   Total Bilirubin 1.9 (H) 0.0 - 1.2 mg/dL   GFR, Estimated >64 >40 mL/min   Anion gap 11 5 - 15  Ammonia   Collection Time: 08/23/23  4:54 AM  Result Value Ref Range   Ammonia 64 (H) 9 - 35 umol/L      RADIOGRAPHIC STUDIES:  I have personally reviewed the radiological images as listed and agree with the findings in the report.  CT CHEST W CONTRAST Result Date: 08/23/2023 CLINICAL DATA:  Occult malignancy. Hepatocellular carcinoma, looking for metastatic lesion. * Tracking Code: BO * EXAM: CT CHEST WITH CONTRAST TECHNIQUE: Multidetector CT imaging of the chest was performed during intravenous contrast administration. RADIATION DOSE REDUCTION: This exam was performed according to the departmental dose-optimization program which includes automated exposure control, adjustment of the mA and/or kV according to patient size and/or use of iterative reconstruction technique. CONTRAST:  50mL OMNIPAQUE IOHEXOL 350 MG/ML SOLN COMPARISON:  None  Available. FINDINGS: Cardiovascular: Normal cardiac size. No pericardial effusion. Small amount of fluid noted in the periaortic recesses. No aortic aneurysm. Mediastinum/Nodes: Visualized thyroid gland  appears grossly unremarkable. No solid / cystic mediastinal masses. The esophagus is nondistended precluding optimal assessment. No axillary, mediastinal or hilar lymphadenopathy by size criteria. Lungs/Pleura: The central tracheo-bronchial tree is patent. There are patchy areas of linear, plate-like atelectasis and/or scarring throughout bilateral lungs with asymmetric more involvement of the middle lobe. No mass or consolidation. No pleural effusion or pneumothorax. No suspicious lung nodules. Upper Abdomen: Redemonstration of large heterogeneous liver masses, incompletely characterized on the current exam. Please refer to MRI abdomen from 08/21/2023 for details. Remaining visualized upper abdominal viscera within normal limits. Musculoskeletal: The visualized soft tissues of the chest wall are grossly unremarkable. No suspicious osseous lesions. IMPRESSION: 1. No metastatic disease identified within the chest. No lung mass, consolidation, pleural effusion or pneumothorax. No suspicious lung nodule. 2. Multiple other nonacute observations, as described above. Electronically Signed   By: Jules Schick M.D.   On: 08/23/2023 09:54   MR LIVER W WO CONTRAST Result Date: 08/22/2023 CLINICAL DATA:  48 year old female with history of liver lesion concerning for potential hepatocellular carcinoma. Follow-up study. EXAM: MRI ABDOMEN WITHOUT AND WITH CONTRAST TECHNIQUE: Multiplanar multisequence MR imaging of the abdomen was performed both before and after the administration of intravenous contrast. CONTRAST:  10mL GADAVIST GADOBUTROL 1 MMOL/ML IV SOLN COMPARISON:  No prior abdominal MRI. CT of the abdomen and pelvis 08/21/2023. FINDINGS: Lower chest: Elevation of the right hemidiaphragm. Otherwise, unremarkable.  Hepatobiliary: Numerous hepatic lesions are noted, with a dominant lesion which replaces much of the right lobe of the liver (axial image 51 of series 15 and coronal image 37 of series 17) estimated to measure approximately 16.5 x 12.7 x 17.7 cm. This lesion is heterogeneous in signal intensity on T1 and T2 weighted images, but clearly has internal areas of hypervascular enhancement on early phase post gadolinium imaging, with persistent low-level enhancement on more delayed imaging. The other dominant lesion is centered in the superior aspect of the liver, likely within the superior aspect of segment 4A (axial image 19 of series 15 and coronal image 53 of series 17) estimated to measure approximately 8.1 x 6.8 x 7.8 cm, with similar imaging characteristics to the previously described lesion, although with greater degree of internal washout and probable pseudo capsule on delayed imaging. Multiple other smaller hypervascular lesions are apparent on arterial phase imaging throughout all aspects of the liver, some of which demonstrate delayed washout. Some amorphous T1 hyperintense material and T2 hypointense material is noted within the lumen of the gallbladder, likely biliary sludge. Gallbladder is not distended. No pericholecystic fluid or surrounding inflammatory changes. Mild intrahepatic biliary ductal dilatation noted in the left lobe of the liver likely secondary to compression of the central bile ducts from the hepatic masses. Common bile duct is normal in caliber measuring 5 mm in the porta hepatis. Pancreas: No pancreatic mass. No pancreatic ductal dilatation. No pancreatic or peripancreatic fluid collections or inflammatory changes. Spleen:  Unremarkable. Adrenals/Urinary Tract: Small T1 hypointense, T2 hyperintense, nonenhancing lesions in both kidneys compatible with simple (Bosniak class 1) cysts, which require no imaging follow-up. No aggressive appearing renal lesions. No hydroureteronephrosis. Left  adrenal gland is unremarkable in appearance. Right adrenal gland is not confidently identified, likely compressed secondary to the adjacent large hepatic mass. Stomach/Bowel: Visualized portions are unremarkable. Vascular/Lymphatic: No aneurysm identified in the visualized abdominal vasculature. No lymphadenopathy noted in the abdomen. Other:  No significant volume of ascites. Musculoskeletal: No aggressive appearing osseous lesions are noted in the visualized portions of the skeleton. IMPRESSION: 1.  Multiple aggressive appearing hepatic lesions, as above, with imaging characteristics most suggestive of multifocal hepatocellular carcinoma. 2. Biliary sludge in the gallbladder. No findings to suggest an acute cholecystitis at this time. 3. Mild intrahepatic biliary ductal dilatation in the left lobe of the liver, likely secondary to compression of the central bile ducts from the hepatic masses. Electronically Signed   By: Trudie Reed M.D.   On: 08/22/2023 09:10   CT ABDOMEN PELVIS W CONTRAST Result Date: 08/21/2023 CLINICAL DATA:  Right side abdominal pain EXAM: CT ABDOMEN AND PELVIS WITH CONTRAST TECHNIQUE: Multidetector CT imaging of the abdomen and pelvis was performed using the standard protocol following bolus administration of intravenous contrast. RADIATION DOSE REDUCTION: This exam was performed according to the departmental dose-optimization program which includes automated exposure control, adjustment of the mA and/or kV according to patient size and/or use of iterative reconstruction technique. CONTRAST:  75mL OMNIPAQUE IOHEXOL 350 MG/ML SOLN COMPARISON:  08/08/2017 FINDINGS: Lower chest: No acute abnormality Hepatobiliary: There is a large mass involving the right hepatic lobe which is new since prior study. This measures 15.5 x 12.5 cm. Areas of central low-density noted within the mass which could reflect scar. The mass is isodense to the remainder the liver on portal venous phase imaging.  Possible large feeding vessel noted posteromedially. Gallbladder unremarkable. Pancreas: No focal abnormality or ductal dilatation. Spleen: No focal abnormality.  Normal size. Adrenals/Urinary Tract: No suspicious renal or adrenal lesion. No stones or hydronephrosis. Urinary bladder unremarkable. Stomach/Bowel: Normal appendix. Stomach, large and small bowel grossly unremarkable. Vascular/Lymphatic: No evidence of aneurysm or adenopathy. There is a spontaneous left splenorenal shunt with suggest portal venous hypertension. This may be due to mass effect and compression on the main portal vein from the large right hepatic mass. Reproductive: Uterus and adnexa unremarkable.  No mass. Other: No free fluid or free air. Musculoskeletal: No acute bony abnormality. IMPRESSION: 15.5 x 12.5 cm solid well-circumscribed mass within the right hepatic lobe. Areas of central low-density suggest possibility of central scar. There may be a large feeding vessel. Favor focal nodular hyperplasia although fibrolamellar HCC can have a similar appearance. Recommend further evaluation MRI without and with contrast. Left spontaneous splenorenal shunt suggest portal venous hypertension. No evidence for cirrhosis. This may be related to mass effect and compression of the portal vein from the large mass. Electronically Signed   By: Charlett Nose M.D.   On: 08/21/2023 01:38   US Abdomen Limited RUQ (LIVER/GB) Result Date: 08/20/2023 CLINICAL DATA:  Right upper quadrant pain EXAM: ULTRASOUND ABDOMEN LIMITED RIGHT UPPER QUADRANT COMPARISON:  CT 08/08/2017 FINDINGS: Gallbladder: No gallstones or wall thickening visualized. No sonographic Murphy sign noted by sonographer. Common bile duct: Diameter: 2.2 mm Liver: Large heterogeneous solid mass measuring 14.8 x 16.4 x 16.9 cm. Portal vein is patent on color Doppler imaging with possible reversal of flow. Other: None. IMPRESSION: 1. Large heterogeneous solid mass within the liver measuring up to  16.9 cm, suspicious for malignancy. Recommend further evaluation with contrast-enhanced CT or MRI. 2. Possible reversal of flow in the portal vein. Electronically Signed   By: Jasmine Pang M.D.   On: 08/20/2023 19:04     This document was completed utilizing speech recognition software. Grammatical errors, random word insertions, pronoun errors, and incomplete sentences are an occasional consequence of this system due to software limitations, ambient noise, and hardware issues. Any formal questions or concerns about the content, text or information contained within the body of this dictation should be directly  addressed to the provider for clarification.

## 2023-08-23 NOTE — Progress Notes (Signed)
        Date: 08/23/2023  Patient name: Christina Gutierrez  Medical record number: [id number]  Date of birth: 01-17-1976   Oncology note reviewed. No biopsy going to be performed  [facility] will be treated with immunotherapy  HBV DNA 8340 ALT 67 Hep E Ag  netative Hep E ag AB +  I will start her on Vemlidy today.  Dr. Drue Second is back tomorrow.   Acey Lav 08/23/2023, 3:31 PM

## 2023-08-23 NOTE — Discharge Summary (Signed)
Physician Discharge Summary  Christina Gutierrez NFA:[id number] DOB: [birth year] DOA: 08/20/2023  PCP: Pcp, No  Admit date: 08/20/2023 Discharge date: 08/23/2023  Admitted From: home  Disposition:  home   Recommendations for Outpatient Follow-up:  Follow up with PCP in 1-2 weeks Follow up Cancer clinic as scheduled   Home Health:NA  Equipment/Devices:NA   Discharge Condition: Stable CODE STATUS: Full code Diet recommendation: Regular diet  Discharge summary: 48 year old with no significant medical history presented with right upper quadrant abdominal pain for about 1 week, poor oral intake, nausea and vomiting.  She had also noticed exanthematous rashes that family diagnosed as chickenpox about 20 days ago.  She had itchy skin rashes that has already been healed and improved.  She also complained of epistaxis.  In the emergency room, hemodynamically stable.  WBC count 6.1.  Hemoglobin 8.  Hematocrit 24.  Platelet 316.  AST and ALT mildly elevated.  Lipase 47.  INR mildly elevated.  Ammonia 62.  HIV negative.  Hepatitis panel positive for IgM antibody, surface antigen positive.  Liver ultrasound and subsequent CT scan with 15 cm well-circumscribed central low-density mass right hepatic lobe.  MRI consistent with multifocal hepatocellular carcinoma. Ct chest without evidence of malignancy.   Multifocal hepatocellular carcinoma, chronic hepatitis B infection. Patient was seen by infectious disease, gastroenterology and then by oncology.  Patient currently with minimal symptoms and occasional upper quadrant abdominal pain.  Tolerating diet. As per oncology recommendations, patient will be following up at cancer clinic for possibly starting on immunotherapy. ID recommended to treat chronic hepatitis B when patient is starting on cancer treatment with Vemlidy.  Will prescribe. Stable for discharge.  Patient was prescribed short course of pain medications.    Discharge Diagnoses:   Principal Problem:   Liver mass Active Problems:   Hepatic failure (HCC)   Disseminated varicella   Normocytic anemia   Transaminitis   Metabolic acidosis   Hyperammonemia (HCC)   Chronic viral hepatitis B without delta-agent (HCC)   Hepatocellular carcinoma [employer])    Discharge Instructions  Discharge Instructions     Diet general   Complete by: As directed    Increase activity slowly   Complete by: As directed       Allergies as of 08/23/2023   No Known Allergies      Medication List     TAKE these medications    diphenhydrAMINE 25 mg capsule Commonly known as: BENADRYL Take 1 capsule (25 mg total) by mouth every 6 (six) hours as needed for itching or allergies.   HYDROcodone-acetaminophen 5-325 MG tablet Commonly known as: NORCO/VICODIN Take 1-2 tablets by mouth every 4 (four) hours as needed for up to 5 days for moderate pain (pain score 4-6) or severe pain (pain score 7-10).   pantoprazole 40 MG tablet Commonly known as: PROTONIX Take 1 tablet (40 mg total) by mouth daily at 6 (six) AM. Start taking on: August 24, 2023   polyethylene glycol 17 g packet Commonly known as: MIRALAX / GLYCOLAX Take 17 g by mouth daily. Start taking on: August 24, 2023        No Known Allergies  Consultations: Oncology Gastroenterology Infectious disease   Procedures/Studies: CT CHEST W CONTRAST Result Date: 08/23/2023 CLINICAL DATA:  Occult malignancy. Hepatocellular carcinoma, looking for metastatic lesion. * Tracking Code: BO * EXAM: CT CHEST WITH CONTRAST TECHNIQUE: Multidetector CT imaging of the chest was performed during intravenous contrast administration. RADIATION DOSE REDUCTION: This exam was performed according to the departmental dose-optimization  program which includes automated exposure control, adjustment of the mA and/or kV according to patient size and/or use of iterative reconstruction technique. CONTRAST:  50mL OMNIPAQUE IOHEXOL 350 MG/ML SOLN  COMPARISON:  None Available. FINDINGS: Cardiovascular: Normal cardiac size. No pericardial effusion. Small amount of fluid noted in the periaortic recesses. No aortic aneurysm. Mediastinum/Nodes: Visualized thyroid gland appears grossly unremarkable. No solid / cystic mediastinal masses. The esophagus is nondistended precluding optimal assessment. No axillary, mediastinal or hilar lymphadenopathy by size criteria. Lungs/Pleura: The central tracheo-bronchial tree is patent. There are patchy areas of linear, plate-like atelectasis and/or scarring throughout bilateral lungs with asymmetric more involvement of the middle lobe. No mass or consolidation. No pleural effusion or pneumothorax. No suspicious lung nodules. Upper Abdomen: Redemonstration of large heterogeneous liver masses, incompletely characterized on the current exam. Please refer to MRI abdomen from 08/21/2023 for details. Remaining visualized upper abdominal viscera within normal limits. Musculoskeletal: The visualized soft tissues of the chest wall are grossly unremarkable. No suspicious osseous lesions. IMPRESSION: 1. No metastatic disease identified within the chest. No lung mass, consolidation, pleural effusion or pneumothorax. No suspicious lung nodule. 2. Multiple other nonacute observations, as described above. Electronically Signed   By: Jules Schick M.D.   On: 08/23/2023 09:54   MR LIVER W WO CONTRAST Result Date: 08/22/2023 CLINICAL DATA:  48 year old female with history of liver lesion concerning for potential hepatocellular carcinoma. Follow-up study. EXAM: MRI ABDOMEN WITHOUT AND WITH CONTRAST TECHNIQUE: Multiplanar multisequence MR imaging of the abdomen was performed both before and after the administration of intravenous contrast. CONTRAST:  10mL GADAVIST GADOBUTROL 1 MMOL/ML IV SOLN COMPARISON:  No prior abdominal MRI. CT of the abdomen and pelvis 08/21/2023. FINDINGS: Lower chest: Elevation of the right hemidiaphragm. Otherwise,  unremarkable. Hepatobiliary: Numerous hepatic lesions are noted, with a dominant lesion which replaces much of the right lobe of the liver (axial image 51 of series 15 and coronal image 37 of series 17) estimated to measure approximately 16.5 x 12.7 x 17.7 cm. This lesion is heterogeneous in signal intensity on T1 and T2 weighted images, but clearly has internal areas of hypervascular enhancement on early phase post gadolinium imaging, with persistent low-level enhancement on more delayed imaging. The other dominant lesion is centered in the superior aspect of the liver, likely within the superior aspect of segment 4A (axial image 19 of series 15 and coronal image 53 of series 17) estimated to measure approximately 8.1 x 6.8 x 7.8 cm, with similar imaging characteristics to the previously described lesion, although with greater degree of internal washout and probable pseudo capsule on delayed imaging. Multiple other smaller hypervascular lesions are apparent on arterial phase imaging throughout all aspects of the liver, some of which demonstrate delayed washout. Some amorphous T1 hyperintense material and T2 hypointense material is noted within the lumen of the gallbladder, likely biliary sludge. Gallbladder is not distended. No pericholecystic fluid or surrounding inflammatory changes. Mild intrahepatic biliary ductal dilatation noted in the left lobe of the liver likely secondary to compression of the central bile ducts from the hepatic masses. Common bile duct is normal in caliber measuring 5 mm in the porta hepatis. Pancreas: No pancreatic mass. No pancreatic ductal dilatation. No pancreatic or peripancreatic fluid collections or inflammatory changes. Spleen:  Unremarkable. Adrenals/Urinary Tract: Small T1 hypointense, T2 hyperintense, nonenhancing lesions in both kidneys compatible with simple (Bosniak class 1) cysts, which require no imaging follow-up. No aggressive appearing renal lesions. No  hydroureteronephrosis. Left adrenal gland is unremarkable in appearance. Right  adrenal gland is not confidently identified, likely compressed secondary to the adjacent large hepatic mass. Stomach/Bowel: Visualized portions are unremarkable. Vascular/Lymphatic: No aneurysm identified in the visualized abdominal vasculature. No lymphadenopathy noted in the abdomen. Other:  No significant volume of ascites. Musculoskeletal: No aggressive appearing osseous lesions are noted in the visualized portions of the skeleton. IMPRESSION: 1. Multiple aggressive appearing hepatic lesions, as above, with imaging characteristics most suggestive of multifocal hepatocellular carcinoma. 2. Biliary sludge in the gallbladder. No findings to suggest an acute cholecystitis at this time. 3. Mild intrahepatic biliary ductal dilatation in the left lobe of the liver, likely secondary to compression of the central bile ducts from the hepatic masses. Electronically Signed   By: Trudie Reed M.D.   On: 08/22/2023 09:10   CT ABDOMEN PELVIS W CONTRAST Result Date: 08/21/2023 CLINICAL DATA:  Right side abdominal pain EXAM: CT ABDOMEN AND PELVIS WITH CONTRAST TECHNIQUE: Multidetector CT imaging of the abdomen and pelvis was performed using the standard protocol following bolus administration of intravenous contrast. RADIATION DOSE REDUCTION: This exam was performed according to the departmental dose-optimization program which includes automated exposure control, adjustment of the mA and/or kV according to patient size and/or use of iterative reconstruction technique. CONTRAST:  75mL OMNIPAQUE IOHEXOL 350 MG/ML SOLN COMPARISON:  08/08/2017 FINDINGS: Lower chest: No acute abnormality Hepatobiliary: There is a large mass involving the right hepatic lobe which is new since prior study. This measures 15.5 x 12.5 cm. Areas of central low-density noted within the mass which could reflect scar. The mass is isodense to the remainder the liver on portal  venous phase imaging. Possible large feeding vessel noted posteromedially. Gallbladder unremarkable. Pancreas: No focal abnormality or ductal dilatation. Spleen: No focal abnormality.  Normal size. Adrenals/Urinary Tract: No suspicious renal or adrenal lesion. No stones or hydronephrosis. Urinary bladder unremarkable. Stomach/Bowel: Normal appendix. Stomach, large and small bowel grossly unremarkable. Vascular/Lymphatic: No evidence of aneurysm or adenopathy. There is a spontaneous left splenorenal shunt with suggest portal venous hypertension. This may be due to mass effect and compression on the main portal vein from the large right hepatic mass. Reproductive: Uterus and adnexa unremarkable.  No mass. Other: No free fluid or free air. Musculoskeletal: No acute bony abnormality. IMPRESSION: 15.5 x 12.5 cm solid well-circumscribed mass within the right hepatic lobe. Areas of central low-density suggest possibility of central scar. There may be a large feeding vessel. Favor focal nodular hyperplasia although fibrolamellar HCC can have a similar appearance. Recommend further evaluation MRI without and with contrast. Left spontaneous splenorenal shunt suggest portal venous hypertension. No evidence for cirrhosis. This may be related to mass effect and compression of the portal vein from the large mass. Electronically Signed   By: Charlett Nose M.D.   On: 08/21/2023 01:38   US Abdomen Limited RUQ (LIVER/GB) Result Date: 08/20/2023 CLINICAL DATA:  Right upper quadrant pain EXAM: ULTRASOUND ABDOMEN LIMITED RIGHT UPPER QUADRANT COMPARISON:  CT 08/08/2017 FINDINGS: Gallbladder: No gallstones or wall thickening visualized. No sonographic Murphy sign noted by sonographer. Common bile duct: Diameter: 2.2 mm Liver: Large heterogeneous solid mass measuring 14.8 x 16.4 x 16.9 cm. Portal vein is patent on color Doppler imaging with possible reversal of flow. Other: None. IMPRESSION: 1. Large heterogeneous solid mass within the  liver measuring up to 16.9 cm, suspicious for malignancy. Recommend further evaluation with contrast-enhanced CT or MRI. 2. Possible reversal of flow in the portal vein. Electronically Signed   By: Jasmine Pang M.D.   On: 08/20/2023 19:04   (  Echo, Carotid, EGD, Colonoscopy, ERCP)    Subjective: Patient seen and examined.  Husband at the bedside.  She denied any complaints.  She does occasionally get right upper quadrant abdominal pain.  Feels comfortable with plan to go home and recover and come back for follow-up at cancer clinic.  Used Programmer, systems and explained about the liver cancer.   Discharge Exam: Vitals:   08/23/23 0519 08/23/23 0745  BP: 122/78 131/75  Pulse: 82 89  Resp: 17 17  Temp: 98.2 F (36.8 C) 98.9 F (37.2 C)  SpO2: 96% 96%   Vitals:   08/22/23 1710 08/22/23 2112 08/23/23 0519 08/23/23 0745  BP: 118/65 112/76 122/78 131/75  Pulse: 87 81 82 89  Resp: 18  17 17   Temp: 98 F (36.7 C) 99.9 F (37.7 C) 98.2 F (36.8 C) 98.9 F (37.2 C)  TempSrc: Oral Oral Oral Oral  SpO2: 100% 98% 96% 96%  Weight:      Height:        General: Pt is alert, awake, not in acute distress Cardiovascular: RRR, S1/S2 +, no rubs, no gallops Respiratory: CTA bilaterally, no wheezing, no rhonchi Abdominal: Soft, NT, firm palpable liver . ND, bowel sounds + Extremities: no edema, no cyanosis    The results of significant diagnostics from this hospitalization (including imaging, microbiology, ancillary and laboratory) are listed below for reference.     Microbiology: Recent Results (from the past 240 hours)  Culture, blood (Routine X 2) w Reflex to ID Panel     Status: None (Preliminary result)   Collection Time: 08/21/23  8:04 PM   Specimen: BLOOD LEFT ARM  Result Value Ref Range Status   Specimen Description BLOOD LEFT ARM  Final   Special Requests   Final    BOTTLES DRAWN AEROBIC AND ANAEROBIC Blood Culture results may not be optimal due to an inadequate volume of  blood received in culture bottles   Culture   Final    NO GROWTH 2 DAYS Performed at [facility], [address] [place], [place] [address]    Report Status PENDING  Incomplete  Culture, blood (Routine X 2) w Reflex to ID Panel     Status: None (Preliminary result)   Collection Time: 08/21/23  8:08 PM   Specimen: BLOOD RIGHT ARM  Result Value Ref Range Status   Specimen Description BLOOD RIGHT ARM  Final   Special Requests   Final    BOTTLES DRAWN AEROBIC AND ANAEROBIC Blood Culture results may not be optimal due to an inadequate volume of blood received in culture bottles   Culture   Final    NO GROWTH 2 DAYS Performed at [facility], [address] [place], [place] [address]    Report Status PENDING  Incomplete     Labs: BNP (last 3 results) No results for input(s): "BNP" in the last 8760 hours. Basic Metabolic Panel: Recent Labs  Lab 08/20/23 1716 08/21/23 0614 08/22/23 0623 08/23/23 0454  NA 137 134* 135 136  K 4.0 3.5 3.6 3.8  CL 106 105 105 101  CO2 21* 22 19* 24  GLUCOSE 103* 119* 92 78  BUN 5* <5* 6 <5*  CREATININE 0.57 0.57 0.69 0.70  CALCIUM 9.0 8.7* 8.8* 9.0   Liver Function Tests: Recent Labs  Lab 08/20/23 1716 08/21/23 0614 08/22/23 0623 08/23/23 0454  AST 280* 266* 227* 209*  ALT 79* 77* 70* 67*  ALKPHOS 100 83 75 79  BILITOT 1.8* 1.9*  2.0* 1.9*  PROT 8.2* 8.1 7.5 7.9  ALBUMIN 2.7* 2.7* 2.7* 2.5*   Recent Labs  Lab 08/20/23 1716  LIPASE 47   Recent Labs  Lab 08/21/23 0106 08/21/23 0614 08/22/23 0623 08/23/23 0454  AMMONIA 62* 66* 71* 64*   CBC: Recent Labs  Lab 08/20/23 1716 08/21/23 0614 08/22/23 0623 08/23/23 0454  WBC 6.1 6.5 6.8 6.3  NEUTROABS 3.7  --   --   --   HGB 8.0* 7.6* 7.9* 7.7*  HCT 24.4* 23.6* 23.7* 23.6*  MCV 92.1 92.5 92.2 93.7  PLT 316 311 278 279   Cardiac Enzymes: No results for input(s): "CKTOTAL", "CKMB", "CKMBINDEX", "TROPONINI" in the last 168 hours. BNP: Invalid input(s):  "POCBNP" CBG: No results for input(s): "GLUCAP" in the last 168 hours. D-Dimer No results for input(s): "DDIMER" in the last 72 hours. Hgb A1c No results for input(s): "HGBA1C" in the last 72 hours. Lipid Profile No results for input(s): "CHOL", "HDL", "LDLCALC", "TRIG", "CHOLHDL", "LDLDIRECT" in the last 72 hours. Thyroid function studies No results for input(s): "TSH", "T4TOTAL", "T3FREE", "THYROIDAB" in the last 72 hours.  Invalid input(s): "FREET3" Anemia work up Recent Labs    08/21/23 1804  VITAMINB12 3,410*  FOLATE 7.8  FERRITIN 168  TIBC 483*  IRON 99  RETICCTPCT 3.1   Urinalysis    Component Value Date/Time   COLORURINE AMBER (A) 08/21/2023 0253   APPEARANCEUR CLEAR 08/21/2023 0253   LABSPEC 1.040 (H) 08/21/2023 0253   PHURINE 6.0 08/21/2023 0253   GLUCOSEU NEGATIVE 08/21/2023 0253   HGBUR NEGATIVE 08/21/2023 0253   BILIRUBINUR NEGATIVE 08/21/2023 0253   KETONESUR NEGATIVE 08/21/2023 0253   PROTEINUR NEGATIVE 08/21/2023 0253   NITRITE NEGATIVE 08/21/2023 0253   LEUKOCYTESUR NEGATIVE 08/21/2023 0253   Sepsis Labs Recent Labs  Lab 08/20/23 1716 08/21/23 0614 08/22/23 0623 08/23/23 0454  WBC 6.1 6.5 6.8 6.3   Microbiology Recent Results (from the past 240 hours)  Culture, blood (Routine X 2) w Reflex to ID Panel     Status: None (Preliminary result)   Collection Time: 08/21/23  8:04 PM   Specimen: BLOOD LEFT ARM  Result Value Ref Range Status   Specimen Description BLOOD LEFT ARM  Final   Special Requests   Final    BOTTLES DRAWN AEROBIC AND ANAEROBIC Blood Culture results may not be optimal due to an inadequate volume of blood received in culture bottles   Culture   Final    NO GROWTH 2 DAYS Performed at [facility], [address] [place], [place] [address]    Report Status PENDING  Incomplete  Culture, blood (Routine X 2) w Reflex to ID Panel     Status: None (Preliminary result)   Collection Time: 08/21/23  8:08 PM   Specimen: BLOOD  RIGHT ARM  Result Value Ref Range Status   Specimen Description BLOOD RIGHT ARM  Final   Special Requests   Final    BOTTLES DRAWN AEROBIC AND ANAEROBIC Blood Culture results may not be optimal due to an inadequate volume of blood received in culture bottles   Culture   Final    NO GROWTH 2 DAYS Performed at [facility], [address], [place], [place] [address]    Report Status PENDING  Incomplete     Time coordinating discharge:  35 minutes  SIGNED:   Dorcas Carrow, MD  [facility] 08/23/2023, 3:10 PM

## 2023-08-24 ENCOUNTER — Other Ambulatory Visit (HOSPITAL_COMMUNITY): Payer: Self-pay

## 2023-08-24 ENCOUNTER — Telehealth: Payer: Self-pay | Admitting: Oncology

## 2023-08-24 LAB — VARICELLA ZOSTER ANTIBODY, IGM: Varicella-Zoster Ab, IgM: <0.91 {index} (ref 0.00–0.90)

## 2023-08-24 NOTE — Telephone Encounter (Signed)
Called the patient two time with interpretor on the line. The patient only answered when interpretor was not on the line. Interpretor left a message for the patient with details of upcoming appointment information.

## 2023-08-25 ENCOUNTER — Other Ambulatory Visit: Payer: Self-pay | Admitting: Family

## 2023-08-25 ENCOUNTER — Other Ambulatory Visit (HOSPITAL_COMMUNITY): Payer: Self-pay

## 2023-08-25 LAB — VARICELLA-ZOSTER BY PCR: Varicella-Zoster, PCR: NEGATIVE

## 2023-08-25 LAB — TYPE AND SCREEN
ABO/RH(D): O POS
Antibody Screen: NEGATIVE
Unit division: 0

## 2023-08-25 LAB — BPAM RBC
Blood Product Expiration Date: 202502192359
ISSUE DATE / TIME: 202502132339
Unit Type and Rh: 9500

## 2023-08-25 MED ORDER — TENOFOVIR ALAFENAMIDE FUMARATE 25 MG PO TABS: 25.0000 mg | ORAL_TABLET | Freq: Every day | ORAL | 4 refills | Status: DC

## 2023-08-26 ENCOUNTER — Other Ambulatory Visit: Payer: Self-pay | Admitting: Oncology

## 2023-08-26 ENCOUNTER — Other Ambulatory Visit: Payer: Self-pay

## 2023-08-26 DIAGNOSIS — C220 Liver cell carcinoma: Secondary | ICD-10-CM

## 2023-08-26 LAB — CULTURE, BLOOD (ROUTINE X 2)
Culture: NO GROWTH
Culture: NO GROWTH

## 2023-08-27 ENCOUNTER — Telehealth: Payer: Self-pay

## 2023-08-27 ENCOUNTER — Encounter: Payer: Self-pay | Admitting: Oncology

## 2023-08-27 ENCOUNTER — Inpatient Hospital Stay: Payer: Self-pay

## 2023-08-27 ENCOUNTER — Inpatient Hospital Stay: Payer: Self-pay | Attending: Oncology | Admitting: Oncology

## 2023-08-27 VITALS — BP 109/71 | HR 93 | Temp 98.3°F | Resp 16 | Wt 120.4 lb

## 2023-08-27 DIAGNOSIS — C220 Liver cell carcinoma: Secondary | ICD-10-CM | POA: Insufficient documentation

## 2023-08-27 DIAGNOSIS — B181 Chronic viral hepatitis B without delta-agent: Secondary | ICD-10-CM | POA: Insufficient documentation

## 2023-08-27 DIAGNOSIS — D649 Anemia, unspecified: Secondary | ICD-10-CM | POA: Insufficient documentation

## 2023-08-27 DIAGNOSIS — E7220 Disorder of urea cycle metabolism, unspecified: Secondary | ICD-10-CM | POA: Insufficient documentation

## 2023-08-27 DIAGNOSIS — Z79899 Other long term (current) drug therapy: Secondary | ICD-10-CM | POA: Insufficient documentation

## 2023-08-27 DIAGNOSIS — G893 Neoplasm related pain (acute) (chronic): Secondary | ICD-10-CM | POA: Insufficient documentation

## 2023-08-27 DIAGNOSIS — Z79624 Long term (current) use of inhibitors of nucleotide synthesis: Secondary | ICD-10-CM | POA: Insufficient documentation

## 2023-08-27 LAB — CBC WITH DIFFERENTIAL (CANCER CENTER ONLY)
Abs Immature Granulocytes: 0.01 10*3/uL (ref 0.00–0.07)
Basophils Absolute: 0.1 10*3/uL (ref 0.0–0.1)
Basophils Relative: 2 %
Eosinophils Absolute: 0.3 10*3/uL (ref 0.0–0.5)
Eosinophils Relative: 6 %
HCT: 27.9 % — ABNORMAL LOW (ref 36.0–46.0)
Hemoglobin: 9.1 g/dL — ABNORMAL LOW (ref 12.0–15.0)
Immature Granulocytes: 0 %
Lymphocytes Relative: 32 %
Lymphs Abs: 1.8 10*3/uL (ref 0.7–4.0)
MCH: 31.2 pg (ref 26.0–34.0)
MCHC: 32.6 g/dL (ref 30.0–36.0)
MCV: 95.5 fL (ref 80.0–100.0)
Monocytes Absolute: 0.5 10*3/uL (ref 0.1–1.0)
Monocytes Relative: 8 %
Neutro Abs: 2.9 10*3/uL (ref 1.7–7.7)
Neutrophils Relative %: 52 %
Platelet Count: 381 10*3/uL (ref 150–400)
RBC: 2.92 MIL/uL — ABNORMAL LOW (ref 3.87–5.11)
RDW: 20.4 % — ABNORMAL HIGH (ref 11.5–15.5)
WBC Count: 5.7 10*3/uL (ref 4.0–10.5)
nRBC: 0 % (ref 0.0–0.2)

## 2023-08-27 LAB — CMP (CANCER CENTER ONLY)
ALT: 69 U/L — ABNORMAL HIGH (ref 0–44)
AST: 196 U/L (ref 15–41)
Albumin: 3.5 g/dL (ref 3.5–5.0)
Alkaline Phosphatase: 106 U/L (ref 38–126)
Anion gap: 7 (ref 5–15)
BUN: 9 mg/dL (ref 6–20)
CO2: 26 mmol/L (ref 22–32)
Calcium: 9.3 mg/dL (ref 8.9–10.3)
Chloride: 101 mmol/L (ref 98–111)
Creatinine: 0.59 mg/dL (ref 0.44–1.00)
GFR, Estimated: >60 mL/min (ref >60)
Glucose, Bld: 90 mg/dL (ref 70–99)
Potassium: 4 mmol/L (ref 3.5–5.1)
Sodium: 134 mmol/L — ABNORMAL LOW (ref 135–145)
Total Bilirubin: 1.6 mg/dL — ABNORMAL HIGH (ref 0.0–1.2)
Total Protein: 8.6 g/dL — ABNORMAL HIGH (ref 6.5–8.1)

## 2023-08-27 LAB — AMMONIA: Ammonia: 18 umol/L (ref 9–35)

## 2023-08-27 MED ORDER — OXYCODONE HCL 10 MG PO TABS
10.0000 mg | ORAL_TABLET | Freq: Four times a day (QID) | ORAL | 0 refills | Status: DC | PRN
Start: 2023-08-27 — End: 2023-08-31

## 2023-08-27 NOTE — Progress Notes (Signed)
CRITICAL VALUE STICKER   CRITICAL VALUE: AST [id number]  RECEIVER (on-site recipient of call): Morrie Sheldon   DATE & TIME NOTIFIED: 08/27/2023 @ 11:40  MESSENGER (representative from lab): Heather   MD NOTIFIED: Dr. Arlana Pouch   TIME OF NOTIFICATION: 11:41  RESPONSE: F/U appt with Dr. Arlana Pouch today

## 2023-08-27 NOTE — Telephone Encounter (Signed)
[facility] Patient Advocate Encounter ? ?Completed and sent Support Path application for Vemlidy for this patient who is uninsured.   ? ?Patient assistance phone number for follow up is [phone].  ? ?This encounter will be updated until final determination.  ? ?Jahniah Pallas, CPhT ?Specialty Pharmacy Patient Advocate ?[facility] ?Phone: [phone] ?Fax:  [phone]  ?

## 2023-08-27 NOTE — Progress Notes (Unsigned)
[facility]  ONCOLOGY CONSULT NOTE   PATIENT NAME: Christina Gutierrez   MR#: [id number] DOB: [birth year]  DATE OF SERVICE: 08/27/2023   [facility] follow-up  Patient Care Team: Pcp, No as PCP - General    CHIEF COMPLAINT/ PURPOSE OF CONSULTATION:   Newly diagnosed multifocal hepatocellular carcinoma  ASSESSMENT & PLAN:   Christina Gutierrez is a 48 y.o. lady with no significant past medical history was hospitalized on 08/21/2023 after she presented with worsening right-sided abdominal pain, nausea, vomiting, decreased oral intake.  Workup including MRI of the abdomen showed evidence of multifocal hepatocellular carcinoma.  She was diagnosed with hepatitis B infection during this hospital admission.  Today she presented to clinic to establish care with [place].  No problem-specific Assessment & Plan notes found for this encounter.   I reviewed lab results and outside records for this visit and discussed relevant results with the patient. Diagnosis, plan of care and treatment options were also discussed in detail with the patient. Opportunity provided to ask questions and answers provided to her apparent satisfaction. Provided instructions to call our clinic with any problems, questions or concerns prior to return visit. I recommended to continue follow-up with PCP and sub-specialists. She verbalized understanding and agreed with the plan. No barriers to learning was detected.  NCCN guidelines have been consulted in the planning of this patient's care.  Meryl Crutch, MD  08/27/2023 11:50 AM  [facility] - A DEPT [facility] [address] [place] [address] Dept: [phone] Dept Fax: [phone]   HISTORY OF PRESENTING ILLNESS:   Discussed the use of AI scribe software for clinical note transcription with the patient, who gave verbal consent to proceed.   I  have reviewed her chart and materials related to her cancer extensively and collaborated history with the patient. Summary of oncologic history is as follows:  ONCOLOGY HISTORY:  48 y.o. lady with no significant past medical history, presented to the emergency department on 08/21/2023 with complaints of right-sided abdominal pain, nausea, vomiting and decreased oral intake for at least 10 days prior to arrival.   On arrival to ED, she was found to have anemia with hemoglobin of 8, white count normal at 6100, platelet count 316,000.  CMP showed elevated AST of 80, ALT increased at 79, bilirubin elevated at 1.8.  Elevated ammonia level of 62.   Right upper quadrant ultrasound showed large heterogenoussolid mass within the liver measuring up to 16.9 cm, suspicious for malignancy. Recommend further evaluation with contrast-enhanced CT or MRI.   CT abdomen pelvis showed: 15.5 x 12.5 cm solid well-circumscribed mass within the right hepatic lobe. Areas of central low-density suggest possibility of central scar. There may be a large feeding vessel. Favor focal nodular hyperplasia although fibrolamellar HCC can have a similar appearance. Recommend further evaluation MRI without and with contrast.   Left spontaneous splenorenal shunt suggest portal venous hypertension. No evidence for cirrhosis. This may be related to mass effect and compression of the portal vein from the large mass.   She was recently diagnosed with chickenpox about 20 days prior to this hospitalization.  Still had some itching from vesicles.   She was admitted for further evaluation and management.   MRI of the abdomen with liver protocol on 08/21/2023 showed numerous hepatic lesions are noted, with a dominant lesion which replaces much of the right lobe of the liver estimated  to measure approximately 16.5 x 12.7 x 17.7 cm. This lesion is heterogeneous in signal intensity on T1 and T2 weighted images, but clearly has internal areas of  hypervascular enhancement on early phase post gadolinium imaging, with persistent low-level enhancement on more delayed imaging. The other dominant lesion is centered in the superior aspect of the liver, likely within the superior aspect of segment 4A estimated to measure approximately 8.1 x 6.8 x 7.8 cm, with similar imaging characteristics to the previously described lesion, although with greater degree of internal washout and probable pseudo capsule on delayed imaging. Multiple other smaller hypervascular lesions are apparent on arterial phase imaging throughout all aspects of the liver, some of which demonstrate delayed washout.   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   On 08/21/2023, AFP was significantly elevated at 32,160 ng/mL.    She was also diagnosed with hepatitis B infection on additional workup.   CT chest on 08/23/2023 showed no evidence of intrathoracic metastatic disease.  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  Plan for systemic treatments with tremelimumab plus durvalumab.  INTERVAL HISTORY:  ***  MEDICAL HISTORY:  History reviewed. No pertinent past medical history.  SURGICAL HISTORY: Past Surgical History:  Procedure Laterality Date   CESAREAN SECTION      SOCIAL HISTORY: Social History   Socioeconomic History   Marital status: Married    Spouse name: Not on file   Number of children: Not on file   Years of education: Not on file   Highest education level: Not on file  Occupational History   Not on file  Tobacco Use   Smoking status: Never   Smokeless tobacco: Never  Vaping Use   Vaping status: Never Used  Substance and Sexual Activity   Alcohol use: No   Drug use: No   Sexual activity: Not on file  Other Topics Concern   Not on file  Social History Narrative   Not on file   Social Drivers of Health   Financial Resource Strain: Not on file  Food Insecurity: No Food Insecurity (08/21/2023)   Hunger Vital Sign    Worried  About Running Out of Food in the Last Year: Never true    Ran Out of Food in the Last Year: Never true  Transportation Needs: No Transportation Needs (08/21/2023)   PRAPARE - Administrator, Civil Service (Medical): No    Lack of Transportation (Non-Medical): No  Physical Activity: Not on file  Stress: Not on file  Social Connections: Not on file  Intimate Partner Violence: Not At Risk (08/21/2023)   Humiliation, Afraid, Rape, and Kick questionnaire    Fear of Current or Ex-Partner: No    Emotionally Abused: No    Physically Abused: No    Sexually Abused: No    FAMILY HISTORY: History reviewed. No pertinent family history.  ALLERGIES:  She has no known allergies.  MEDICATIONS:  Current Outpatient Medications  Medication Sig Dispense Refill   diphenhydrAMINE (BENADRYL) 25 mg capsule Take 1 capsule (25 mg total) by mouth every 6 (six) hours as needed for itching or allergies. 30 capsule 0   HYDROcodone-acetaminophen (NORCO/VICODIN) 5-325 MG tablet Take 1-2 tablets by mouth every 4 (four) hours as needed for up to 5 days for moderate pain (pain score 4-6) or severe pain (pain score 7-10). 20 tablet 0   pantoprazole (PROTONIX) 40 MG tablet Take 1 tablet (40 mg total) by mouth daily at 6 (six) AM. 30 tablet 0  polyethylene glycol (MIRALAX / GLYCOLAX) 17 g packet Take 17 g by mouth daily. 14 each 0   tenofovir alafenamide (VEMLIDY) 25 MG tablet Take 1 tablet (25 mg total) by mouth daily. 30 tablet 4   No current facility-administered medications for this visit.    REVIEW OF SYSTEMS:    Review of Systems - Oncology  All other pertinent systems were reviewed with the patient and are negative.  PHYSICAL EXAMINATION:  ***   There were no vitals filed for this visit. There were no vitals filed for this visit.  Physical Exam  ***  LABORATORY DATA:   I have reviewed the data as listed.  Results for orders placed or performed in visit on 08/27/23  Ammonia  Result  Value Ref Range   Ammonia 18 9 - 35 umol/L  CMP (Cancer Center only)  Result Value Ref Range   Sodium 134 (L) 135 - 145 mmol/L   Potassium 4.0 3.5 - 5.1 mmol/L   Chloride 101 98 - 111 mmol/L   CO2 26 22 - 32 mmol/L   Glucose, Bld 90 70 - 99 mg/dL   BUN 9 6 - 20 mg/dL   Creatinine 1.61 0.96 - 1.00 mg/dL   Calcium 9.3 8.9 - 04.5 mg/dL   Total Protein 8.6 (H) 6.5 - 8.1 g/dL   Albumin 3.5 3.5 - 5.0 g/dL   AST 409 (HH) 15 - 41 U/L   ALT 69 (H) 0 - 44 U/L   Alkaline Phosphatase 106 38 - 126 U/L   Total Bilirubin 1.6 (H) 0.0 - 1.2 mg/dL   GFR, Estimated >81 >19 mL/min   Anion gap 7 5 - 15  CBC with Differential (Cancer Center Only)  Result Value Ref Range   WBC Count 5.7 4.0 - 10.5 K/uL   RBC 2.92 (L) 3.87 - 5.11 MIL/uL   Hemoglobin 9.1 (L) 12.0 - 15.0 g/dL   HCT 14.7 (L) 82.9 - 56.2 %   MCV 95.5 80.0 - 100.0 fL   MCH 31.2 26.0 - 34.0 pg   MCHC 32.6 30.0 - 36.0 g/dL   RDW 13.0 (H) 86.5 - 78.4 %   Platelet Count 381 150 - 400 K/uL   nRBC 0.0 0.0 - 0.2 %   Neutrophils Relative % 52 %   Neutro Abs 2.9 1.7 - 7.7 K/uL   Lymphocytes Relative 32 %   Lymphs Abs 1.8 0.7 - 4.0 K/uL   Monocytes Relative 8 %   Monocytes Absolute 0.5 0.1 - 1.0 K/uL   Eosinophils Relative 6 %   Eosinophils Absolute 0.3 0.0 - 0.5 K/uL   Basophils Relative 2 %   Basophils Absolute 0.1 0.0 - 0.1 K/uL   Immature Granulocytes 0 %   Abs Immature Granulocytes 0.01 0.00 - 0.07 K/uL     Recent Labs    08/22/23 0623 08/23/23 0454 08/27/23 1025  NA 135 136 134*  K 3.6 3.8 4.0  CL 105 101 101  CO2 19* 24 26  GLUCOSE 92 78 90  BUN 6 <5* 9  CREATININE 0.69 0.70 0.59  CALCIUM 8.8* 9.0 9.3  GFRNONAA >60 >60 >60  PROT 7.5 7.9 8.6*  ALBUMIN 2.7* 2.5* 3.5  AST 227* 209* 196*  ALT 70* 67* 69*  ALKPHOS 75 79 106  BILITOT 2.0* 1.9* 1.6*      RADIOGRAPHIC STUDIES:  I have personally reviewed the radiological images as listed and agree with the findings in the report.  CT CHEST W CONTRAST Result Date:  08/23/2023  CLINICAL DATA:  Occult malignancy. Hepatocellular carcinoma, looking for metastatic lesion. * Tracking Code: BO * EXAM: CT CHEST WITH CONTRAST TECHNIQUE: Multidetector CT imaging of the chest was performed during intravenous contrast administration. RADIATION DOSE REDUCTION: This exam was performed according to the departmental dose-optimization program which includes automated exposure control, adjustment of the mA and/or kV according to patient size and/or use of iterative reconstruction technique. CONTRAST:  50mL OMNIPAQUE IOHEXOL 350 MG/ML SOLN COMPARISON:  None Available. FINDINGS: Cardiovascular: Normal cardiac size. No pericardial effusion. Small amount of fluid noted in the periaortic recesses. No aortic aneurysm. Mediastinum/Nodes: Visualized thyroid gland appears grossly unremarkable. No solid / cystic mediastinal masses. The esophagus is nondistended precluding optimal assessment. No axillary, mediastinal or hilar lymphadenopathy by size criteria. Lungs/Pleura: The central tracheo-bronchial tree is patent. There are patchy areas of linear, plate-like atelectasis and/or scarring throughout bilateral lungs with asymmetric more involvement of the middle lobe. No mass or consolidation. No pleural effusion or pneumothorax. No suspicious lung nodules. Upper Abdomen: Redemonstration of large heterogeneous liver masses, incompletely characterized on the current exam. Please refer to MRI abdomen from 08/21/2023 for details. Remaining visualized upper abdominal viscera within normal limits. Musculoskeletal: The visualized soft tissues of the chest wall are grossly unremarkable. No suspicious osseous lesions. IMPRESSION: 1. No metastatic disease identified within the chest. No lung mass, consolidation, pleural effusion or pneumothorax. No suspicious lung nodule. 2. Multiple other nonacute observations, as described above. Electronically Signed   By: Jules Schick M.D.   On: 08/23/2023 09:54   MR LIVER  W WO CONTRAST Result Date: 08/22/2023 CLINICAL DATA:  48 year old female with history of liver lesion concerning for potential hepatocellular carcinoma. Follow-up study. EXAM: MRI ABDOMEN WITHOUT AND WITH CONTRAST TECHNIQUE: Multiplanar multisequence MR imaging of the abdomen was performed both before and after the administration of intravenous contrast. CONTRAST:  10mL GADAVIST GADOBUTROL 1 MMOL/ML IV SOLN COMPARISON:  No prior abdominal MRI. CT of the abdomen and pelvis 08/21/2023. FINDINGS: Lower chest: Elevation of the right hemidiaphragm. Otherwise, unremarkable. Hepatobiliary: Numerous hepatic lesions are noted, with a dominant lesion which replaces much of the right lobe of the liver (axial image 51 of series 15 and coronal image 37 of series 17) estimated to measure approximately 16.5 x 12.7 x 17.7 cm. This lesion is heterogeneous in signal intensity on T1 and T2 weighted images, but clearly has internal areas of hypervascular enhancement on early phase post gadolinium imaging, with persistent low-level enhancement on more delayed imaging. The other dominant lesion is centered in the superior aspect of the liver, likely within the superior aspect of segment 4A (axial image 19 of series 15 and coronal image 53 of series 17) estimated to measure approximately 8.1 x 6.8 x 7.8 cm, with similar imaging characteristics to the previously described lesion, although with greater degree of internal washout and probable pseudo capsule on delayed imaging. Multiple other smaller hypervascular lesions are apparent on arterial phase imaging throughout all aspects of the liver, some of which demonstrate delayed washout. Some amorphous T1 hyperintense material and T2 hypointense material is noted within the lumen of the gallbladder, likely biliary sludge. Gallbladder is not distended. No pericholecystic fluid or surrounding inflammatory changes. Mild intrahepatic biliary ductal dilatation noted in the left lobe of the liver  likely secondary to compression of the central bile ducts from the hepatic masses. Common bile duct is normal in caliber measuring 5 mm in the porta hepatis. Pancreas: No pancreatic mass. No pancreatic ductal dilatation. No pancreatic or peripancreatic fluid collections  or inflammatory changes. Spleen:  Unremarkable. Adrenals/Urinary Tract: Small T1 hypointense, T2 hyperintense, nonenhancing lesions in both kidneys compatible with simple (Bosniak class 1) cysts, which require no imaging follow-up. No aggressive appearing renal lesions. No hydroureteronephrosis. Left adrenal gland is unremarkable in appearance. Right adrenal gland is not confidently identified, likely compressed secondary to the adjacent large hepatic mass. Stomach/Bowel: Visualized portions are unremarkable. Vascular/Lymphatic: No aneurysm identified in the visualized abdominal vasculature. No lymphadenopathy noted in the abdomen. Other:  No significant volume of ascites. Musculoskeletal: No aggressive appearing osseous lesions are noted in the visualized portions of the skeleton. IMPRESSION: 1. Multiple aggressive appearing hepatic lesions, as above, with imaging characteristics most suggestive of multifocal hepatocellular carcinoma. 2. Biliary sludge in the gallbladder. No findings to suggest an acute cholecystitis at this time. 3. Mild intrahepatic biliary ductal dilatation in the left lobe of the liver, likely secondary to compression of the central bile ducts from the hepatic masses. Electronically Signed   By: Trudie Reed M.D.   On: 08/22/2023 09:10   CT ABDOMEN PELVIS W CONTRAST Result Date: 08/21/2023 CLINICAL DATA:  Right side abdominal pain EXAM: CT ABDOMEN AND PELVIS WITH CONTRAST TECHNIQUE: Multidetector CT imaging of the abdomen and pelvis was performed using the standard protocol following bolus administration of intravenous contrast. RADIATION DOSE REDUCTION: This exam was performed according to the departmental  dose-optimization program which includes automated exposure control, adjustment of the mA and/or kV according to patient size and/or use of iterative reconstruction technique. CONTRAST:  75mL OMNIPAQUE IOHEXOL 350 MG/ML SOLN COMPARISON:  08/08/2017 FINDINGS: Lower chest: No acute abnormality Hepatobiliary: There is a large mass involving the right hepatic lobe which is new since prior study. This measures 15.5 x 12.5 cm. Areas of central low-density noted within the mass which could reflect scar. The mass is isodense to the remainder the liver on portal venous phase imaging. Possible large feeding vessel noted posteromedially. Gallbladder unremarkable. Pancreas: No focal abnormality or ductal dilatation. Spleen: No focal abnormality.  Normal size. Adrenals/Urinary Tract: No suspicious renal or adrenal lesion. No stones or hydronephrosis. Urinary bladder unremarkable. Stomach/Bowel: Normal appendix. Stomach, large and small bowel grossly unremarkable. Vascular/Lymphatic: No evidence of aneurysm or adenopathy. There is a spontaneous left splenorenal shunt with suggest portal venous hypertension. This may be due to mass effect and compression on the main portal vein from the large right hepatic mass. Reproductive: Uterus and adnexa unremarkable.  No mass. Other: No free fluid or free air. Musculoskeletal: No acute bony abnormality. IMPRESSION: 15.5 x 12.5 cm solid well-circumscribed mass within the right hepatic lobe. Areas of central low-density suggest possibility of central scar. There may be a large feeding vessel. Favor focal nodular hyperplasia although fibrolamellar HCC can have a similar appearance. Recommend further evaluation MRI without and with contrast. Left spontaneous splenorenal shunt suggest portal venous hypertension. No evidence for cirrhosis. This may be related to mass effect and compression of the portal vein from the large mass. Electronically Signed   By: Charlett Nose M.D.   On: 08/21/2023 01:38    US Abdomen Limited RUQ (LIVER/GB) Result Date: 08/20/2023 CLINICAL DATA:  Right upper quadrant pain EXAM: ULTRASOUND ABDOMEN LIMITED RIGHT UPPER QUADRANT COMPARISON:  CT 08/08/2017 FINDINGS: Gallbladder: No gallstones or wall thickening visualized. No sonographic Murphy sign noted by sonographer. Common bile duct: Diameter: 2.2 mm Liver: Large heterogeneous solid mass measuring 14.8 x 16.4 x 16.9 cm. Portal vein is patent on color Doppler imaging with possible reversal of flow. Other: None. IMPRESSION: 1. Large  heterogeneous solid mass within the liver measuring up to 16.9 cm, suspicious for malignancy. Recommend further evaluation with contrast-enhanced CT or MRI. 2. Possible reversal of flow in the portal vein. Electronically Signed   By: Jasmine Pang M.D.   On: 08/20/2023 19:04    No orders of the defined types were placed in this encounter.   CODE STATUS:  Code Status History     Date Active Date Inactive Code Status Order ID Comments User Context   08/21/2023 0400 08/23/2023 2127 Full Code [id number]  Tereasa Coop, MD ED    Questions for Most Recent Historical Code Status (Order [id number])     Question Answer   By: Consent: discussion documented in EHR            Future Appointments  Date Time Provider Department Center  09/22/2023  9:45 AM Daiva Eves, Lisette Grinder, MD [facility]     I spent a total of 40 minutes during this encounter with the patient including review of chart and various tests results, discussions about plan of care and coordination of care plan.  This document was completed utilizing speech recognition software. Grammatical errors, random word insertions, pronoun errors, and incomplete sentences are an occasional consequence of this system due to software limitations, ambient noise, and hardware issues. Any formal questions or concerns about the content, text or information contained within the body of this dictation should be directly addressed to the  provider for clarification.

## 2023-08-28 ENCOUNTER — Other Ambulatory Visit: Payer: Self-pay | Admitting: Pharmacist

## 2023-08-28 ENCOUNTER — Encounter (HOSPITAL_COMMUNITY): Payer: Self-pay | Admitting: Emergency Medicine

## 2023-08-28 ENCOUNTER — Inpatient Hospital Stay (HOSPITAL_COMMUNITY)
Admission: EM | Admit: 2023-08-28 | Discharge: 2023-08-31 | DRG: 948 | Disposition: A | Discharge status: Home / Self Care | Payer: Self-pay | Attending: Internal Medicine | Admitting: Internal Medicine

## 2023-08-28 ENCOUNTER — Telehealth: Payer: Self-pay | Admitting: Family

## 2023-08-28 ENCOUNTER — Other Ambulatory Visit: Payer: Self-pay

## 2023-08-28 ENCOUNTER — Other Ambulatory Visit (HOSPITAL_COMMUNITY): Payer: Self-pay

## 2023-08-28 ENCOUNTER — Telehealth: Payer: Self-pay

## 2023-08-28 ENCOUNTER — Encounter: Payer: Self-pay | Admitting: Oncology

## 2023-08-28 DIAGNOSIS — R112 Nausea with vomiting, unspecified: Secondary | ICD-10-CM

## 2023-08-28 DIAGNOSIS — C229 Malignant neoplasm of liver, not specified as primary or secondary: Principal | ICD-10-CM

## 2023-08-28 DIAGNOSIS — G893 Neoplasm related pain (acute) (chronic): Principal | ICD-10-CM

## 2023-08-28 DIAGNOSIS — B181 Chronic viral hepatitis B without delta-agent: Secondary | ICD-10-CM | POA: Diagnosis present

## 2023-08-28 DIAGNOSIS — C220 Liver cell carcinoma: Secondary | ICD-10-CM

## 2023-08-28 DIAGNOSIS — E861 Hypovolemia: Secondary | ICD-10-CM | POA: Diagnosis present

## 2023-08-28 DIAGNOSIS — Z79899 Other long term (current) drug therapy: Secondary | ICD-10-CM

## 2023-08-28 DIAGNOSIS — R52 Pain, unspecified: Secondary | ICD-10-CM | POA: Diagnosis present

## 2023-08-28 DIAGNOSIS — E860 Dehydration: Secondary | ICD-10-CM | POA: Diagnosis present

## 2023-08-28 LAB — CBC WITH DIFFERENTIAL/PLATELET
Abs Immature Granulocytes: 0.01 10*3/uL (ref 0.00–0.07)
Basophils Absolute: 0.1 10*3/uL (ref 0.0–0.1)
Basophils Relative: 2 %
Eosinophils Absolute: 0.2 10*3/uL (ref 0.0–0.5)
Eosinophils Relative: 4 %
HCT: 27.8 % — ABNORMAL LOW (ref 36.0–46.0)
Hemoglobin: 8.6 g/dL — ABNORMAL LOW (ref 12.0–15.0)
Immature Granulocytes: 0 %
Lymphocytes Relative: 19 %
Lymphs Abs: 1 10*3/uL (ref 0.7–4.0)
MCH: 30.3 pg (ref 26.0–34.0)
MCHC: 30.9 g/dL (ref 30.0–36.0)
MCV: 97.9 fL (ref 80.0–100.0)
Monocytes Absolute: 0.4 10*3/uL (ref 0.1–1.0)
Monocytes Relative: 7 %
Neutro Abs: 3.6 10*3/uL (ref 1.7–7.7)
Neutrophils Relative %: 68 %
Platelets: 381 10*3/uL (ref 150–400)
RBC: 2.84 MIL/uL — ABNORMAL LOW (ref 3.87–5.11)
RDW: 20.4 % — ABNORMAL HIGH (ref 11.5–15.5)
WBC: 5.3 10*3/uL (ref 4.0–10.5)
nRBC: 0 % (ref 0.0–0.2)

## 2023-08-28 MED ORDER — ONDANSETRON HCL 4 MG/2ML IJ SOLN
4.0000 mg | Freq: Once | INTRAMUSCULAR | Status: AC
  Administered 2023-08-28: 4 mg via INTRAVENOUS
  Filled 2023-08-28: qty 2

## 2023-08-28 MED ORDER — SODIUM CHLORIDE 0.9 % IV BOLUS
1000.0000 mL | Freq: Once | INTRAVENOUS | Status: AC
  Administered 2023-08-28: 1000 mL via INTRAVENOUS

## 2023-08-28 MED ORDER — LIDOCAINE-PRILOCAINE 2.5-2.5 % EX CREA: TOPICAL_CREAM | CUTANEOUS | 3 refills | Status: DC

## 2023-08-28 MED ORDER — PROCHLORPERAZINE MALEATE 10 MG PO TABS: 10.0000 mg | ORAL_TABLET | Freq: Four times a day (QID) | ORAL | 1 refills | Status: DC | PRN

## 2023-08-28 MED ORDER — ONDANSETRON HCL 8 MG PO TABS: 8.0000 mg | ORAL_TABLET | Freq: Three times a day (TID) | ORAL | 1 refills | Status: DC | PRN

## 2023-08-28 MED ORDER — TENOFOVIR ALAFENAMIDE FUMARATE 25 MG PO TABS
25.0000 mg | ORAL_TABLET | Freq: Every day | ORAL | 4 refills | Status: DC
  Filled 2023-08-28: qty 30, 30d supply, fill #0
  Filled 2023-09-18: qty 30, 30d supply, fill #1

## 2023-08-28 NOTE — ED Notes (Addendum)
Pt's family told this NT that the Pt is priority and needs to be seen. I then explained the triage process. Pt and family stated they were leaving.

## 2023-08-28 NOTE — ED Triage Notes (Signed)
Pt in with RUQ pain, +n/v. Hx of Liver CA, pain uncontrolled with home meds. Recently d/c on 2/16

## 2023-08-28 NOTE — Progress Notes (Signed)
 START ON PATHWAY REGIMEN - Hepatobiliary     A cycle is every 21 days:     Atezolizumab      Bevacizumab-xxxx   **Always confirm dose/schedule in your pharmacy ordering system**  Patient Characteristics: Hepatocellular Carcinoma, Unresectable or Nonsurgical Candidate, Locally Advanced or Metastatic Disease Not Amenable to Locoregional Therapy, Systemic Therapy, First Line, No Prior Transplant and Candidate for Immunotherapy Hepatobiliary Disease Type: Hepatocellular Carcinoma Line of Therapy: First Line Intent of Therapy: Non-Curative / Palliative Intent, Discussed with Patient

## 2023-08-28 NOTE — Telephone Encounter (Signed)
[facility] Patient Advocate Encounter  Completed and sent SUPPORT PATH application for VEMLIDY for this patient who is uninsured.    Patient is approved 08/27/23 through 07/06/24. Prescription can be filled at [facility].  BIN      [id number] PCN    [id number] [id number]    [id number] ID        [id number]   Clearance Coots, CPhT [facility]ty Pharm[facility] Patient [place] [facility] Phone: [phone] Fax:  [phone]

## 2023-08-28 NOTE — Assessment & Plan Note (Signed)
Chronic pain managed with hydrocodone, transitioning to oxycodone to avoid excessive acetaminophen intake, which can affect liver function. - Prescribe oxycodone, start with 2-3 tablets per day, up to 4 tablets if needed - Send prescription to [facility] and [employer]

## 2023-08-28 NOTE — Progress Notes (Signed)
Specialty Pharmacy Initiation Note   Christina Gutierrez is a 48 y.o. female who will be followed by the specialty pharmacy service for RxSp Hepatitis B    Review of administration, indication, effectiveness, safety, potential side effects, storage/disposable, and missed dose instructions occurred today for patient's specialty medication(s) Tenofovir Alafenamide Fumarate (VEMLIDY)     Patient/Caregiver did not have any additional questions or concerns.   Patient's therapy is appropriate to: Initiate    Goals Addressed             This Visit's Progress    Achieve sustained HBV viral load suppression       Patient is initiating therapy. Patient will be evaluated at upcoming provider appointment to assess progress      Comply with lab assessments       Patient is on track. Patient will adhere to provider and/or lab appointments      Maintain optimal adherence to therapy       Patient is initiating therapy. Patient will work on increased adherence         Jennette Kettle Specialty Pharmacist

## 2023-08-28 NOTE — Assessment & Plan Note (Signed)
-  Patient presented with abdominal pain.  LFTs noted to be elevated.  -Given classic features of hepatocellular carcinoma on MRI, we can skip biopsy of the liver lesions.  MRI of the abdomen with liver protocol on 08/21/2023 showed numerous hepatic lesions are noted, with a dominant lesion which replaces much of the right lobe of the liver estimated to measure approximately 16.5 x 12.7 x 17.7 cm. This lesion is heterogeneous in signal intensity on T1 and T2 weighted images, but clearly has internal areas of hypervascular enhancement on early phase post gadolinium imaging, with persistent low-level enhancement on more delayed imaging. The other dominant lesion is centered in the superior aspect of the liver, likely within the superior aspect of segment 4A estimated to measure approximately 8.1 x 6.8 x 7.8 cm, with similar imaging characteristics to the previously described lesion, although with greater degree of internal washout and probable pseudo capsule on delayed imaging. Multiple other smaller hypervascular lesions are apparent on arterial phase imaging throughout all aspects of the liver, some of which demonstrate delayed washout.   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  -Previously undiagnosed hepatitis B could have been the contributing factor. -Not a candidate for liver transplant or surgical resection because of the extent of disease. -Discussed diagnosis, prognosis, plan of care, treatment options.  Reviewed NCCN guidelines.   On 08/21/2023, AFP was significantly elevated at 32,160 ng/mL.     CT chest on 08/23/2023 showed no evidence of intrathoracic metastatic disease.  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  Plan for systemic treatments with atezolizumab plus bevacizumab or tremelimumab plus durvalumab.  Will have to enroll her in a patient distance program and initiate treatment after that.  Will also discuss her case in GI tumor conference next week.

## 2023-08-28 NOTE — Assessment & Plan Note (Signed)
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 Awaiting authorization for tenofovir. - Follow up with infectious disease specialists for tenofovir authorization - Monitor liver function tests and ammonia levels

## 2023-08-28 NOTE — ED Notes (Signed)
Pt seen leaving the ED with family.  

## 2023-08-28 NOTE — Telephone Encounter (Signed)
-----   Message from Bobette Mo sent at 08/28/2023  9:48 AM EST ----- Regarding: Ardeen Garland,  If you can send in the [facility] script for this patient to [facility] please.   Thank You,  Clearance Coots, CPhT Specialty Pharmacy Patient [facility] for Infectious [facility] Phone: [phone] Fax: [phone]

## 2023-08-28 NOTE — Progress Notes (Signed)
Specialty Pharmacy Initial Fill Coordination Note  Tailer Volkert is a 48 y.o. female contacted today regarding initial fill of specialty medication(s) Tenofovir Alafenamide Fumarate (VEMLIDY)   Patient requested Courier to Provider Office   Delivery date: 08/31/23   Verified address: [address] [place] [address]   Medication will be filled on 08/28/23.   Patient is aware of 0.00 copayment.

## 2023-08-29 ENCOUNTER — Encounter (HOSPITAL_COMMUNITY): Payer: Self-pay | Admitting: Internal Medicine

## 2023-08-29 ENCOUNTER — Other Ambulatory Visit: Payer: Self-pay

## 2023-08-29 DIAGNOSIS — R52 Pain, unspecified: Secondary | ICD-10-CM

## 2023-08-29 LAB — COMPREHENSIVE METABOLIC PANEL
ALT: 81 U/L — ABNORMAL HIGH (ref 0–44)
ALT: 70 U/L — ABNORMAL HIGH (ref 0–44)
AST: 245 U/L — ABNORMAL HIGH (ref 15–41)
AST: 201 U/L — ABNORMAL HIGH (ref 15–41)
Albumin: 3 g/dL — ABNORMAL LOW (ref 3.5–5.0)
Albumin: 2.6 g/dL — ABNORMAL LOW (ref 3.5–5.0)
Alkaline Phosphatase: 99 U/L (ref 38–126)
Alkaline Phosphatase: 85 U/L (ref 38–126)
Anion gap: 11 (ref 5–15)
Anion gap: 8 (ref 5–15)
BUN: 10 mg/dL (ref 6–20)
BUN: 8 mg/dL (ref 6–20)
CO2: 22 mmol/L (ref 22–32)
CO2: 22 mmol/L (ref 22–32)
Calcium: 9.1 mg/dL (ref 8.9–10.3)
Calcium: 8.6 mg/dL — ABNORMAL LOW (ref 8.9–10.3)
Chloride: 100 mmol/L (ref 98–111)
Chloride: 106 mmol/L (ref 98–111)
Creatinine, Ser: 0.59 mg/dL (ref 0.44–1.00)
Creatinine, Ser: 0.6 mg/dL (ref 0.44–1.00)
GFR, Estimated: >60 mL/min (ref >60)
GFR, Estimated: >60 mL/min (ref >60)
Glucose, Bld: 95 mg/dL (ref 70–99)
Glucose, Bld: 76 mg/dL (ref 70–99)
Potassium: 4.7 mmol/L (ref 3.5–5.1)
Potassium: 3.9 mmol/L (ref 3.5–5.1)
Sodium: 133 mmol/L — ABNORMAL LOW (ref 135–145)
Sodium: 136 mmol/L (ref 135–145)
Total Bilirubin: 1.7 mg/dL — ABNORMAL HIGH (ref 0.0–1.2)
Total Bilirubin: 1.2 mg/dL (ref 0.0–1.2)
Total Protein: 8.8 g/dL — ABNORMAL HIGH (ref 6.5–8.1)
Total Protein: 7.7 g/dL (ref 6.5–8.1)

## 2023-08-29 LAB — CBC
HCT: 24.2 % — ABNORMAL LOW (ref 36.0–46.0)
Hemoglobin: 7.6 g/dL — ABNORMAL LOW (ref 12.0–15.0)
MCH: 30.3 pg (ref 26.0–34.0)
MCHC: 31.4 g/dL (ref 30.0–36.0)
MCV: 96.4 fL (ref 80.0–100.0)
Platelets: 334 10*3/uL (ref 150–400)
RBC: 2.51 MIL/uL — ABNORMAL LOW (ref 3.87–5.11)
RDW: 19.8 % — ABNORMAL HIGH (ref 11.5–15.5)
WBC: 5.4 10*3/uL (ref 4.0–10.5)
nRBC: 0 % (ref 0.0–0.2)

## 2023-08-29 LAB — PROTIME-INR
INR: 1.4 — ABNORMAL HIGH (ref 0.8–1.2)
Prothrombin Time: 17.5 s — ABNORMAL HIGH (ref 11.4–15.2)

## 2023-08-29 LAB — LIPASE, BLOOD: Lipase: 51 U/L (ref 11–51)

## 2023-08-29 LAB — PHOSPHORUS: Phosphorus: 3.2 mg/dL (ref 2.5–4.6)

## 2023-08-29 LAB — MAGNESIUM: Magnesium: 2.1 mg/dL (ref 1.7–2.4)

## 2023-08-29 MED ORDER — MORPHINE SULFATE (PF) 2 MG/ML IV SOLN: 2.0000 mg | INTRAVENOUS | Status: DC | PRN

## 2023-08-29 MED ORDER — OXYCODONE HCL 5 MG PO TABS
10.0000 mg | ORAL_TABLET | Freq: Four times a day (QID) | ORAL | Status: DC | PRN
  Administered 2023-08-29 – 2023-08-31 (×5): 10 mg via ORAL
  Filled 2023-08-29 (×5): qty 2

## 2023-08-29 MED ORDER — MELATONIN 5 MG PO TABS
5.0000 mg | ORAL_TABLET | Freq: Every evening | ORAL | Status: DC | PRN
  Administered 2023-08-29: 5 mg via ORAL
  Filled 2023-08-29: qty 1

## 2023-08-29 MED ORDER — SODIUM CHLORIDE 0.9 % IV SOLN: INTRAVENOUS | Status: DC

## 2023-08-29 MED ORDER — PANTOPRAZOLE SODIUM 40 MG PO TBEC
40.0000 mg | DELAYED_RELEASE_TABLET | Freq: Every day | ORAL | Status: DC
  Administered 2023-08-29 – 2023-08-31 (×3): 40 mg via ORAL
  Filled 2023-08-29 (×3): qty 1

## 2023-08-29 MED ORDER — HEPARIN SODIUM (PORCINE) 5000 UNIT/ML IJ SOLN
5000.0000 [IU] | Freq: Three times a day (TID) | INTRAMUSCULAR | Status: DC
  Administered 2023-08-29 – 2023-08-31 (×7): 5000 [IU] via SUBCUTANEOUS
  Filled 2023-08-29 (×6): qty 1

## 2023-08-29 MED ORDER — POLYETHYLENE GLYCOL 3350 17 G PO PACK
17.0000 g | PACK | Freq: Every day | ORAL | Status: DC
  Administered 2023-08-30 – 2023-08-31 (×2): 17 g via ORAL
  Filled 2023-08-29 (×3): qty 1

## 2023-08-29 MED ORDER — PROCHLORPERAZINE EDISYLATE 10 MG/2ML IJ SOLN
5.0000 mg | Freq: Four times a day (QID) | INTRAMUSCULAR | Status: DC | PRN
  Administered 2023-08-29 – 2023-08-30 (×2): 5 mg via INTRAVENOUS
  Filled 2023-08-29 (×2): qty 2

## 2023-08-29 MED ORDER — POLYETHYLENE GLYCOL 3350 17 G PO PACK
17.0000 g | PACK | Freq: Every day | ORAL | Status: DC | PRN
Start: 2023-08-29 — End: 2023-08-31

## 2023-08-29 MED ORDER — DIPHENHYDRAMINE HCL 25 MG PO CAPS: 25.0000 mg | ORAL_CAPSULE | Freq: Four times a day (QID) | ORAL | Status: DC | PRN

## 2023-08-29 MED ORDER — TENOFOVIR ALAFENAMIDE FUMARATE 25 MG PO TABS
25.0000 mg | ORAL_TABLET | Freq: Every day | ORAL | Status: DC
  Administered 2023-08-29 – 2023-08-31 (×3): 25 mg via ORAL
  Filled 2023-08-29 (×4): qty 1

## 2023-08-29 MED ORDER — FENTANYL CITRATE PF 50 MCG/ML IJ SOSY
50.0000 ug | PREFILLED_SYRINGE | Freq: Once | INTRAMUSCULAR | Status: AC
  Administered 2023-08-29: 50 ug via INTRAVENOUS
  Filled 2023-08-29: qty 1

## 2023-08-29 NOTE — H&P (Signed)
 History and Physical  Christina Gutierrez ZOX:[id number] DOB: 1975-11-29 DOA: 08/28/2023  Referring physician: Dr. Nicanor Alcon, EDP. PCP: Meryl Crutch, MD  Outpatient Specialists: Medical oncology, infectious disease. Patient coming from: Home.  Chief Complaint: Severe abdominal pain and intractable nausea and vomiting.  HPI: Christina Gutierrez is a 48 y.o. female with no significant past medical history until recently when she was admitted from 08/20/2023 until 08/23/2023 and found to have an undiagnosed hepatitis B infection and MRI picture indicative of multifocal hepatocellular carcinoma, who presents to the ER today due to severe diffuse abdominal pain and intractable nausea and vomiting.  She vomited several times today prior to presentation to the ER.  No subjective fevers reported.  In the ER, hypovolemic on exam.  Nausea and vomiting improved with IV antiemetics.  Diffuse abdominal pain was also improved with IV opiate based analgesics.  EDP requested admission for further management.  Admitted by [facility], hospitalist service.  ED Course: Temperature 98.5.  BP 1 6/80, pulse 78, respiration 18, saturation 98% on room air.  Review of Systems: Review of systems as noted in the HPI. All other systems reviewed and are negative.   Past Medical History:  Diagnosis Date   Metabolic acidosis 08/21/2023   Past Surgical History:  Procedure Laterality Date   CESAREAN SECTION      Social History:  reports that she has never smoked. She has never used smokeless tobacco. She reports that she does not drink alcohol and does not use drugs.   No Known Allergies  Family history: None reported.  Prior to Admission medications   Medication Sig Start Date End Date Taking? Authorizing Provider  diphenhydrAMINE (BENADRYL) 25 mg capsule Take 1 capsule (25 mg total) by mouth every 6 (six) hours as needed for itching or allergies. 08/23/23   Dorcas Carrow, MD  lidocaine-prilocaine (EMLA)  cream Apply to affected area once 08/28/23   Pasam, Avinash, MD  ondansetron (ZOFRAN) 8 MG tablet Take 1 tablet (8 mg total) by mouth every 8 (eight) hours as needed for nausea or vomiting. 08/28/23   Pasam, Avinash, MD  Oxycodone HCl 10 MG TABS Take 1 tablet (10 mg total) by mouth every 6 (six) hours as needed. 08/27/23   Pasam, Archie Patten, MD  pantoprazole (PROTONIX) 40 MG tablet Take 1 tablet (40 mg total) by mouth daily at 6 (six) AM. 08/24/23 09/23/23  Dorcas Carrow, MD  polyethylene glycol (MIRALAX / GLYCOLAX) 17 g packet Take 17 g by mouth daily. 08/24/23   Dorcas Carrow, MD  prochlorperazine (COMPAZINE) 10 MG tablet Take 1 tablet (10 mg total) by mouth every 6 (six) hours as needed for nausea or vomiting. 08/28/23   Pasam, Archie Patten, MD  tenofovir alafenamide (VEMLIDY) 25 MG tablet Take 1 tablet (25 mg total) by mouth daily. 08/28/23   Veryl Speak, FNP    Physical Exam: BP 118/74   Pulse 72   Temp 98.5 F (36.9 C) (Oral)   Resp 18   Wt 54.6 kg   SpO2 98%   BMI 23.51 kg/m   General: 48 y.o. year-old female well developed well nourished in no acute distress.  Alert and oriented x3. Cardiovascular: Regular rate and rhythm with no rubs or gallops.  No thyromegaly or JVD noted.  No lower extremity edema. 2/4 pulses in all 4 extremities. Respiratory: Clear to auscultation with no wheezes or rales. Good inspiratory effort. Abdomen: Soft diffuse tenderness.  Nondistended with normal bowel sounds x4 quadrants. Muskuloskeletal: No cyanosis, clubbing or edema noted bilaterally  Neuro: CN II-XII intact, strength, sensation, reflexes Skin: No ulcerative lesions noted or rashes Psychiatry: Judgement and insight appear normal. Mood is appropriate for condition and setting          Labs on Admission:  Basic Metabolic Panel: Recent Labs  Lab 08/22/23 0623 08/23/23 0454 08/27/23 1025 08/28/23 2309  NA 135 136 134* 133*  K 3.6 3.8 4.0 4.7  CL 105 101 101 100  CO2 19* 24 26 22   GLUCOSE 92 78  90 95  BUN 6 <5* 9 10  CREATININE 0.69 0.70 0.59 0.59  CALCIUM 8.8* 9.0 9.3 9.1   Liver Function Tests: Recent Labs  Lab 08/22/23 0623 08/23/23 0454 08/27/23 1025 08/28/23 2309  AST 227* 209* 196* 245*  ALT 70* 67* 69* 81*  ALKPHOS 75 79 106 99  BILITOT 2.0* 1.9* 1.6* 1.7*  PROT 7.5 7.9 8.6* 8.8*  ALBUMIN 2.7* 2.5* 3.5 3.0*   Recent Labs  Lab 08/28/23 2309  LIPASE 51   Recent Labs  Lab 08/22/23 0623 08/23/23 0454 08/27/23 1024  AMMONIA 71* 64* 18   CBC: Recent Labs  Lab 08/22/23 0623 08/23/23 0454 08/27/23 1025 08/28/23 2309  WBC 6.8 6.3 5.7 5.3  NEUTROABS  --   --  2.9 3.6  HGB 7.9* 7.7* 9.1* 8.6*  HCT 23.7* 23.6* 27.9* 27.8*  MCV 92.2 93.7 95.5 97.9  PLT 278 279 381 381   Cardiac Enzymes: No results for input(s): "CKTOTAL", "CKMB", "CKMBINDEX", "TROPONINI" in the last 168 hours.  BNP (last 3 results) No results for input(s): "BNP" in the last 8760 hours.  ProBNP (last 3 results) No results for input(s): "PROBNP" in the last 8760 hours.  CBG: No results for input(s): "GLUCAP" in the last 168 hours.  Radiological Exams on Admission: No results found.  EKG: I independently viewed the EKG done and my findings are as followed: None available at the time of this visit.  Assessment/Plan Present on Admission:  Intractable pain  Principal Problem:   Intractable pain  Intractable abdominal pain, nausea and vomiting in the setting of newly diagnosed multifocal hepatocellular carcinoma, hepatitis B infection. Continue as needed antiemetics Continue as needed analgesics and bowel regimen Continue IV fluid hydration Encourage oral intake as tolerated  Presumed multifocal hepatocellular carcinoma, seen on MRI Per medical oncology, not biopsied given classic features for hepatocellular carcinoma on MRI, MRI picture indicative of multifocal hepatocellular carcinoma Seen by medical oncology outpatient with plan for systemic immunotherapy  Previously  undiagnosed hepatitis B infection Chronic hepatitis B infection contributing to hepatocellular carcinoma Seen by infectious disease. Per the patient, tenofovir was approved and she will receive the medication on Monday, 08/31/2023. Tenofovir 25 mg daily, prescribed by infectious disease  Cancer associated pain Continue analgesics Continue bowel regimen Continue as needed antiemetics  Elevated liver chemistries in the setting of hepatitis B and hepatocellular carcinoma Monitor LFTs Avoid hepatotoxic agents Obtain INR    Time: 75 minutes.    DVT prophylaxis: Subcu heparin 3 times daily  Code Status: Full code.  Family Communication: Updated patient's husband and son at bedside.  Disposition Plan: Admitted to MedSurg unit.  Consults called: None.  Admission status: Observation status.   Status is: Observation    Darlin Drop MD [facility] Pager [phone]  If 7PM-7AM, please contact night-coverage [email or web address] Password [email or web address]  08/29/2023, 4:09 AM

## 2023-08-29 NOTE — ED Provider Notes (Signed)
 [facility] AT [facility] Provider Note   CSN: [id number] Arrival date & time: 08/28/23  2159     History  Chief Complaint  Patient presents with   Abdominal Pain   Liver CA    Christina Gutierrez is a 48 y.o. female.  The history is provided by the patient.  Abdominal Pain Pain location:  RUQ Pain quality: aching   Pain radiates to:  Does not radiate Pain severity:  Severe Onset quality:  Gradual Duration:  1 week Timing:  Constant Progression:  Worsening Chronicity:  Recurrent Context comment:  Has liver cancer, placed on pain medication without nausea and emesis is not on antiemetics Relieved by:  Nothing Worsened by:  Nothing Ineffective treatments:  None tried Associated symptoms: nausea and vomiting   Associated symptoms: no fever   Risk factors: not elderly        Home Medications Prior to Admission medications   Medication Sig Start Date End Date Taking? Authorizing Provider  diphenhydrAMINE (BENADRYL) 25 mg capsule Take 1 capsule (25 mg total) by mouth every 6 (six) hours as needed for itching or allergies. 08/23/23   Dorcas Carrow, MD  lidocaine-prilocaine (EMLA) cream Apply to affected area once 08/28/23   Pasam, Avinash, MD  ondansetron (ZOFRAN) 8 MG tablet Take 1 tablet (8 mg total) by mouth every 8 (eight) hours as needed for nausea or vomiting. 08/28/23   Pasam, Avinash, MD  Oxycodone HCl 10 MG TABS Take 1 tablet (10 mg total) by mouth every 6 (six) hours as needed. 08/27/23   Pasam, Archie Patten, MD  pantoprazole (PROTONIX) 40 MG tablet Take 1 tablet (40 mg total) by mouth daily at 6 (six) AM. 08/24/23 09/23/23  Dorcas Carrow, MD  polyethylene glycol (MIRALAX / GLYCOLAX) 17 g packet Take 17 g by mouth daily. 08/24/23   Dorcas Carrow, MD  prochlorperazine (COMPAZINE) 10 MG tablet Take 1 tablet (10 mg total) by mouth every 6 (six) hours as needed for nausea or vomiting. 08/28/23   Pasam, Archie Patten, MD  tenofovir alafenamide  (VEMLIDY) 25 MG tablet Take 1 tablet (25 mg total) by mouth daily. 08/28/23   Veryl Speak, FNP      Allergies    Patient has no known allergies.    Review of Systems   Review of Systems  Constitutional:  Negative for fever.  HENT:  Negative for facial swelling.   Respiratory:  Negative for wheezing and stridor.   Gastrointestinal:  Positive for abdominal pain, nausea and vomiting.  All other systems reviewed and are negative.   Physical Exam Updated Vital Signs BP 114/64   Pulse 87   Temp 98.8 F (37.1 C) (Oral)   Resp 16   Wt 54.6 kg   SpO2 99%   BMI 23.51 kg/m  Physical Exam Vitals and nursing note reviewed.  Constitutional:      General: She is not in acute distress.    Appearance: Normal appearance. She is well-developed.  HENT:     Head: Normocephalic and atraumatic.     Nose: Nose normal.  Eyes:     Pupils: Pupils are equal, round, and reactive to light.  Cardiovascular:     Rate and Rhythm: Normal rate and regular rhythm.     Pulses: Normal pulses.     Heart sounds: Normal heart sounds.  Pulmonary:     Effort: Pulmonary effort is normal. No respiratory distress.     Breath sounds: Normal breath sounds.  Abdominal:  General: Bowel sounds are normal. There is no distension.     Palpations: Abdomen is soft.     Tenderness: There is no abdominal tenderness. There is no guarding or rebound.  Musculoskeletal:        General: Normal range of motion.     Cervical back: Normal range of motion and neck supple.  Skin:    General: Skin is warm and dry.     Capillary Refill: Capillary refill takes less than 2 seconds.     Findings: No erythema or rash.  Neurological:     General: No focal deficit present.     Mental Status: She is alert and oriented to person, place, and time.     Deep Tendon Reflexes: Reflexes normal.  Psychiatric:        Mood and Affect: Mood normal.     ED Results / Procedures / Treatments   Labs (all labs ordered are listed, but  only abnormal results are displayed) Results for orders placed or performed during the hospital encounter of 08/28/23  CBC with Differential   Collection Time: 08/28/23 11:09 PM  Result Value Ref Range   WBC 5.3 4.0 - 10.5 K/uL   RBC 2.84 (L) 3.87 - 5.11 MIL/uL   Hemoglobin 8.6 (L) 12.0 - 15.0 g/dL   HCT 16.1 (L) 09.6 - 04.5 %   MCV 97.9 80.0 - 100.0 fL   MCH 30.3 26.0 - 34.0 pg   MCHC 30.9 30.0 - 36.0 g/dL   RDW 40.9 (H) 81.1 - 91.4 %   Platelets 381 150 - 400 K/uL   nRBC 0.0 0.0 - 0.2 %   Neutrophils Relative % 68 %   Neutro Abs 3.6 1.7 - 7.7 K/uL   Lymphocytes Relative 19 %   Lymphs Abs 1.0 0.7 - 4.0 K/uL   Monocytes Relative 7 %   Monocytes Absolute 0.4 0.1 - 1.0 K/uL   Eosinophils Relative 4 %   Eosinophils Absolute 0.2 0.0 - 0.5 K/uL   Basophils Relative 2 %   Basophils Absolute 0.1 0.0 - 0.1 K/uL   Immature Granulocytes 0 %   Abs Immature Granulocytes 0.01 0.00 - 0.07 K/uL  Comprehensive metabolic panel   Collection Time: 08/28/23 11:09 PM  Result Value Ref Range   Sodium 133 (L) 135 - 145 mmol/L   Potassium 4.7 3.5 - 5.1 mmol/L   Chloride 100 98 - 111 mmol/L   CO2 22 22 - 32 mmol/L   Glucose, Bld 95 70 - 99 mg/dL   BUN 10 6 - 20 mg/dL   Creatinine, Ser 7.82 0.44 - 1.00 mg/dL   Calcium 9.1 8.9 - 95.6 mg/dL   Total Protein 8.8 (H) 6.5 - 8.1 g/dL   Albumin 3.0 (L) 3.5 - 5.0 g/dL   AST 213 (H) 15 - 41 U/L   ALT 81 (H) 0 - 44 U/L   Alkaline Phosphatase 99 38 - 126 U/L   Total Bilirubin 1.7 (H) 0.0 - 1.2 mg/dL   GFR, Estimated >08 >65 mL/min   Anion gap 11 5 - 15  Lipase, blood   Collection Time: 08/28/23 11:09 PM  Result Value Ref Range   Lipase 51 11 - 51 U/L   CT CHEST W CONTRAST Result Date: 08/23/2023 CLINICAL DATA:  Occult malignancy. Hepatocellular carcinoma, looking for metastatic lesion. * Tracking Code: BO * EXAM: CT CHEST WITH CONTRAST TECHNIQUE: Multidetector CT imaging of the chest was performed during intravenous contrast administration. RADIATION  DOSE REDUCTION: This exam was performed  according to the departmental dose-optimization program which includes automated exposure control, adjustment of the mA and/or kV according to patient size and/or use of iterative reconstruction technique. CONTRAST:  50mL OMNIPAQUE IOHEXOL 350 MG/ML SOLN COMPARISON:  None Available. FINDINGS: Cardiovascular: Normal cardiac size. No pericardial effusion. Small amount of fluid noted in the periaortic recesses. No aortic aneurysm. Mediastinum/Nodes: Visualized thyroid gland appears grossly unremarkable. No solid / cystic mediastinal masses. The esophagus is nondistended precluding optimal assessment. No axillary, mediastinal or hilar lymphadenopathy by size criteria. Lungs/Pleura: The central tracheo-bronchial tree is patent. There are patchy areas of linear, plate-like atelectasis and/or scarring throughout bilateral lungs with asymmetric more involvement of the middle lobe. No mass or consolidation. No pleural effusion or pneumothorax. No suspicious lung nodules. Upper Abdomen: Redemonstration of large heterogeneous liver masses, incompletely characterized on the current exam. Please refer to MRI abdomen from 08/21/2023 for details. Remaining visualized upper abdominal viscera within normal limits. Musculoskeletal: The visualized soft tissues of the chest wall are grossly unremarkable. No suspicious osseous lesions. IMPRESSION: 1. No metastatic disease identified within the chest. No lung mass, consolidation, pleural effusion or pneumothorax. No suspicious lung nodule. 2. Multiple other nonacute observations, as described above. Electronically Signed   By: Jules Schick M.D.   On: 08/23/2023 09:54   MR LIVER W WO CONTRAST Result Date: 08/22/2023 CLINICAL DATA:  48 year old female with history of liver lesion concerning for potential hepatocellular carcinoma. Follow-up study. EXAM: MRI ABDOMEN WITHOUT AND WITH CONTRAST TECHNIQUE: Multiplanar multisequence MR imaging of the  abdomen was performed both before and after the administration of intravenous contrast. CONTRAST:  10mL GADAVIST GADOBUTROL 1 MMOL/ML IV SOLN COMPARISON:  No prior abdominal MRI. CT of the abdomen and pelvis 08/21/2023. FINDINGS: Lower chest: Elevation of the right hemidiaphragm. Otherwise, unremarkable. Hepatobiliary: Numerous hepatic lesions are noted, with a dominant lesion which replaces much of the right lobe of the liver (axial image 51 of series 15 and coronal image 37 of series 17) estimated to measure approximately 16.5 x 12.7 x 17.7 cm. This lesion is heterogeneous in signal intensity on T1 and T2 weighted images, but clearly has internal areas of hypervascular enhancement on early phase post gadolinium imaging, with persistent low-level enhancement on more delayed imaging. The other dominant lesion is centered in the superior aspect of the liver, likely within the superior aspect of segment 4A (axial image 19 of series 15 and coronal image 53 of series 17) estimated to measure approximately 8.1 x 6.8 x 7.8 cm, with similar imaging characteristics to the previously described lesion, although with greater degree of internal washout and probable pseudo capsule on delayed imaging. Multiple other smaller hypervascular lesions are apparent on arterial phase imaging throughout all aspects of the liver, some of which demonstrate delayed washout. Some amorphous T1 hyperintense material and T2 hypointense material is noted within the lumen of the gallbladder, likely biliary sludge. Gallbladder is not distended. No pericholecystic fluid or surrounding inflammatory changes. Mild intrahepatic biliary ductal dilatation noted in the left lobe of the liver likely secondary to compression of the central bile ducts from the hepatic masses. Common bile duct is normal in caliber measuring 5 mm in the porta hepatis. Pancreas: No pancreatic mass. No pancreatic ductal dilatation. No pancreatic or peripancreatic fluid  collections or inflammatory changes. Spleen:  Unremarkable. Adrenals/Urinary Tract: Small T1 hypointense, T2 hyperintense, nonenhancing lesions in both kidneys compatible with simple (Bosniak class 1) cysts, which require no imaging follow-up. No aggressive appearing renal lesions. No hydroureteronephrosis. Left adrenal gland is  unremarkable in appearance. Right adrenal gland is not confidently identified, likely compressed secondary to the adjacent large hepatic mass. Stomach/Bowel: Visualized portions are unremarkable. Vascular/Lymphatic: No aneurysm identified in the visualized abdominal vasculature. No lymphadenopathy noted in the abdomen. Other:  No significant volume of ascites. Musculoskeletal: No aggressive appearing osseous lesions are noted in the visualized portions of the skeleton. IMPRESSION: 1. Multiple aggressive appearing hepatic lesions, as above, with imaging characteristics most suggestive of multifocal hepatocellular carcinoma. 2. Biliary sludge in the gallbladder. No findings to suggest an acute cholecystitis at this time. 3. Mild intrahepatic biliary ductal dilatation in the left lobe of the liver, likely secondary to compression of the central bile ducts from the hepatic masses. Electronically Signed   By: Trudie Reed M.D.   On: 08/22/2023 09:10   CT ABDOMEN PELVIS W CONTRAST Result Date: 08/21/2023 CLINICAL DATA:  Right side abdominal pain EXAM: CT ABDOMEN AND PELVIS WITH CONTRAST TECHNIQUE: Multidetector CT imaging of the abdomen and pelvis was performed using the standard protocol following bolus administration of intravenous contrast. RADIATION DOSE REDUCTION: This exam was performed according to the departmental dose-optimization program which includes automated exposure control, adjustment of the mA and/or kV according to patient size and/or use of iterative reconstruction technique. CONTRAST:  75mL OMNIPAQUE IOHEXOL 350 MG/ML SOLN COMPARISON:  08/08/2017 FINDINGS: Lower chest:  No acute abnormality Hepatobiliary: There is a large mass involving the right hepatic lobe which is new since prior study. This measures 15.5 x 12.5 cm. Areas of central low-density noted within the mass which could reflect scar. The mass is isodense to the remainder the liver on portal venous phase imaging. Possible large feeding vessel noted posteromedially. Gallbladder unremarkable. Pancreas: No focal abnormality or ductal dilatation. Spleen: No focal abnormality.  Normal size. Adrenals/Urinary Tract: No suspicious renal or adrenal lesion. No stones or hydronephrosis. Urinary bladder unremarkable. Stomach/Bowel: Normal appendix. Stomach, large and small bowel grossly unremarkable. Vascular/Lymphatic: No evidence of aneurysm or adenopathy. There is a spontaneous left splenorenal shunt with suggest portal venous hypertension. This may be due to mass effect and compression on the main portal vein from the large right hepatic mass. Reproductive: Uterus and adnexa unremarkable.  No mass. Other: No free fluid or free air. Musculoskeletal: No acute bony abnormality. IMPRESSION: 15.5 x 12.5 cm solid well-circumscribed mass within the right hepatic lobe. Areas of central low-density suggest possibility of central scar. There may be a large feeding vessel. Favor focal nodular hyperplasia although fibrolamellar HCC can have a similar appearance. Recommend further evaluation MRI without and with contrast. Left spontaneous splenorenal shunt suggest portal venous hypertension. No evidence for cirrhosis. This may be related to mass effect and compression of the portal vein from the large mass. Electronically Signed   By: Charlett Nose M.D.   On: 08/21/2023 01:38   US Abdomen Limited RUQ (LIVER/GB) Result Date: 08/20/2023 CLINICAL DATA:  Right upper quadrant pain EXAM: ULTRASOUND ABDOMEN LIMITED RIGHT UPPER QUADRANT COMPARISON:  CT 08/08/2017 FINDINGS: Gallbladder: No gallstones or wall thickening visualized. No sonographic  Murphy sign noted by sonographer. Common bile duct: Diameter: 2.2 mm Liver: Large heterogeneous solid mass measuring 14.8 x 16.4 x 16.9 cm. Portal vein is patent on color Doppler imaging with possible reversal of flow. Other: None. IMPRESSION: 1. Large heterogeneous solid mass within the liver measuring up to 16.9 cm, suspicious for malignancy. Recommend further evaluation with contrast-enhanced CT or MRI. 2. Possible reversal of flow in the portal vein. Electronically Signed   By: Adrian Prows.D.  On: 08/20/2023 19:04     Radiology No results found.  Procedures Procedures    Medications Ordered in ED Medications  fentaNYL (SUBLIMAZE) injection 50 mcg (has no administration in time range)  0.9 %  sodium chloride infusion (has no administration in time range)  diphenhydrAMINE (BENADRYL) capsule 25 mg (has no administration in time range)  Oxycodone HCl TABS 10 mg (has no administration in time range)  pantoprazole (PROTONIX) EC tablet 40 mg (has no administration in time range)  polyethylene glycol (MIRALAX / GLYCOLAX) packet 17 g (has no administration in time range)  tenofovir alafenamide (VEMLIDY) tablet 25 mg (has no administration in time range)  prochlorperazine (COMPAZINE) injection 5 mg (has no administration in time range)  melatonin tablet 5 mg (has no administration in time range)  morphine (PF) 2 MG/ML injection 2 mg (has no administration in time range)  sodium chloride 0.9 % bolus 1,000 mL (1,000 mLs Intravenous New Bag/Given 08/28/23 2343)  ondansetron (ZOFRAN) injection 4 mg (4 mg Intravenous Given 08/28/23 2344)    ED Course/ Medical Decision Making/ A&P                                 Medical Decision Making Patient with intractable pain and vomiting   Amount and/or Complexity of Data Reviewed External Data Reviewed: notes.    Details: Previous notes reviewed  Labs: ordered.    Details: Normal white count 5.3, hemoglobin low 8.6, normal platelet count.   Slightly low sodium 133, normal potassium 4.7, normal creatinine 0.59, elevated AST and ALT  Risk Prescription drug management. Parenteral controlled substances. Decision regarding hospitalization. Risk Details: Will admit for dehydration and intractable pain and vomiting.     Final Clinical Impression(s) / ED Diagnoses Final diagnoses:  None   The patient appears reasonably stabilized for admission considering the current resources, flow, and capabilities available in the ED at this time, and I doubt any other [facility] requiring further screening and/or treatment in the ED prior to admission.  Rx / DC Orders ED Discharge Orders     None         Madison Albea, MD 08/29/23 0454

## 2023-08-29 NOTE — Progress Notes (Signed)
 48 year old female admitted with abdominal pain and intractable nausea and vomiting.  Patient with new diagnosis of acute hepatitis B infection and multifocal hepatocellular carcinoma just seen by her oncologist just about to start chemotherapy and followed by infectious disease for hepatitis B she is being started on Vemlidy.  She has not been started on chemo or Vemlidy yet.  Labs noted.  Elevated LFTs otherwise unremarkable. Patient seen in the ER.  Family at bedside.  She feels her abdominal pain is improved has not had any nausea or vomiting overnight.  She is willing to try clear liquid diet. Will start her on a clear liquid diet and advance as tolerated.  Unclear what brought on this episode of nausea and vomiting.  She is having bowel movements no evidence of bowel obstruction.

## 2023-08-29 NOTE — ED Notes (Signed)
 Pt ambulated independently to restroom, steady gait noted.  Pt is A&O x 4 and in no obvious distress

## 2023-08-30 ENCOUNTER — Inpatient Hospital Stay (HOSPITAL_COMMUNITY): Payer: Self-pay

## 2023-08-30 MED ORDER — ONDANSETRON HCL 4 MG/2ML IJ SOLN
4.0000 mg | Freq: Four times a day (QID) | INTRAMUSCULAR | Status: DC
  Administered 2023-08-30 – 2023-08-31 (×5): 4 mg via INTRAVENOUS
  Filled 2023-08-30 (×4): qty 2

## 2023-08-30 NOTE — Plan of Care (Signed)

## 2023-08-30 NOTE — Progress Notes (Signed)
 PROGRESS NOTE    Christina Gutierrez  ZOX:[id number] DOB: 07-19-1975 DOA: 08/28/2023 PCP: Meryl Crutch, MD  Brief Narrative: 48 year old female diagnosed with hepatitis B this month along with multifocal hepatocellular carcinoma admitted with diffuse abdominal pain intractable nausea and vomiting.  She has not had a biopsy since she had classic features of hepatocellular carcinoma on MRI.  They are however trying to get a skin biopsy.   MRI of the abdomen on August 21, 2023-showed numerous hepatic lesions are noted, with a dominant lesion which replaces much of the right lobe of the liver estimated to measure approximately 16.5 x 12.7 x 17.7 cm. This lesion is heterogeneous in signal intensity on T1 and T2 weighted images, but clearly has internal areas of hypervascular enhancement on early phase post gadolinium imaging, with persistent low-level enhancement on more delayed imaging. The other dominant lesion is centered in the superior aspect of the liver, likely within the superior aspect of segment 4A estimated to measure approximately 8.1 x 6.8 x 7.8 cm, with similar imaging characteristics to the previously described lesion, although with greater degree of internal washout and probable pseudo capsule on delayed imaging. Multiple other smaller hypervascular lesions are apparent on arterial phase imaging throughout all aspects of the liver, some of which demonstrate delayed washout.  She is not a candidate for transplant or surgical resection because of the extent of the disease.  Her alpha-fetoprotein level was 32,160. Denies diarrhea or constipation Kub 2/23 pending Assessment & Plan:   Principal Problem:   Intractable pain   #1  Intractable nausea and vomiting in the setting of newly diagnosed multifocal hepatocellular carcinoma and hepatitis B infection.   Will continue supportive treatment with IV fluids Zofran and Phenergan etc.  Will try clear liquids.  She reports she vomited this  morning but staff not aware.   #2 multifocal hepatocellular carcinoma, seen on MRI Per medical oncology, not biopsied given classic features for hepatocellular carcinoma on MRI, planning for outpatient systemic immunotherapy.   #3 hepatitis B started on tenofovir 08/29/2023 for the first time.  This has been prescribed by ID as outpatient.  #4 cancer associated pain improving with Dilaudid and morphine patient was on oxycodone prior to admission    #5 elevated LFTs secondary to hepatocellular carcinoma follow-up labs in AM.   Estimated body mass index is 23.51 kg/m as calculated from the following:   Height as of this encounter: 5' (1.524 m).   Weight as of this encounter: 54.6 kg.  DVT prophylaxis: heparin Code Status: Full code Family Communication: Family member at bedside Disposition Plan:  Status is: Inpatient Remains inpatient appropriate because: 38 min   Consultants: Discuss with oncology 08/31/2023 Procedures: none Antimicrobials none  Subjective:  Patient resting in bed she reports she vomited at 6:30 AM today but overnight staff and the staff have not reported that  Objective: Vitals:   08/29/23 1256 08/29/23 1740 08/29/23 2123 08/30/23 0532  BP: 109/77 112/72 100/64 118/75  Pulse: 77 85 91 93  Resp: 16 18 18 18   Temp: 98.2 F (36.8 C) 98.5 F (36.9 C) 98.5 F (36.9 C) 98.4 F (36.9 C)  TempSrc: Oral Oral Oral Oral  SpO2: 98% 100% 98% 99%  Weight:      Height:        Intake/Output Summary (Last 24 hours) at 08/30/2023 1248 Last data filed at 08/30/2023 0000 Gross per 24 hour  Intake 320 ml  Output --  Net 320 ml   [employer]  08/28/23 2218 08/29/23 1115  Weight: 54.6 kg 54.6 kg    Examination:  General exam: Appears in no acute distress Respiratory system: Clear to auscultation. Respiratory effort normal. Cardiovascular system: S1 & S2 heard, RRR. No JVD, murmurs, rubs, gallops or clicks. No pedal edema. Gastrointestinal system: Abdomen is  nondistended, soft and nontender. No organomegaly or masses felt. Normal bowel sounds heard. Central nervous system: Alert and oriented. No focal neurological deficits. Extremities: no edema Data Reviewed: I have personally reviewed following labs and imaging studies  CBC: Recent Labs  Lab 08/27/23 1025 08/28/23 2309 08/29/23 0504  WBC 5.7 5.3 5.4  NEUTROABS 2.9 3.6  --   HGB 9.1* 8.6* 7.6*  HCT 27.9* 27.8* 24.2*  MCV 95.5 97.9 96.4  PLT 381 381 334   Basic Metabolic Panel: Recent Labs  Lab 08/27/23 1025 08/28/23 2309 08/29/23 0504  NA 134* 133* 136  K 4.0 4.7 3.9  CL 101 100 106  CO2 26 22 22   GLUCOSE 90 95 76  BUN 9 10 8   CREATININE 0.59 0.59 0.60  CALCIUM 9.3 9.1 8.6*  MG  --   --  2.1  PHOS  --   --  3.2   GFR: Estimated Creatinine Clearance: 68.1 mL/min (by C-G formula based on SCr of 0.6 mg/dL). Liver Function Tests: Recent Labs  Lab 08/27/23 1025 08/28/23 2309 08/29/23 0504  AST 196* 245* 201*  ALT 69* 81* 70*  ALKPHOS 106 99 85  BILITOT 1.6* 1.7* 1.2  PROT 8.6* 8.8* 7.7  ALBUMIN 3.5 3.0* 2.6*   Recent Labs  Lab 08/28/23 2309  LIPASE 51   Recent Labs  Lab 08/27/23 1024  AMMONIA 18   Coagulation Profile: Recent Labs  Lab 08/29/23 0504  INR 1.4*   Cardiac Enzymes: No results for input(s): "CKTOTAL", "CKMB", "CKMBINDEX", "TROPONINI" in the last 168 hours. BNP (last 3 results) No results for input(s): "PROBNP" in the last 8760 hours. HbA1C: No results for input(s): "HGBA1C" in the last 72 hours. CBG: No results for input(s): "GLUCAP" in the last 168 hours. Lipid Profile: No results for input(s): "CHOL", "HDL", "LDLCALC", "TRIG", "CHOLHDL", "LDLDIRECT" in the last 72 hours. Thyroid Function Tests: No results for input(s): "TSH", "T4TOTAL", "FREET4", "T3FREE", "THYROIDAB" in the last 72 hours. Anemia Panel: No results for input(s): "VITAMINB12", "FOLATE", "FERRITIN", "TIBC", "IRON", "RETICCTPCT" in the last 72 hours. Sepsis Labs: No  results for input(s): "PROCALCITON", "LATICACIDVEN" in the last 168 hours.  Recent Results (from the past 240 hours)  Varicella-zoster by PCR     Status: None   Collection Time: 08/21/23  6:14 AM   Specimen: Blood  Result Value Ref Range Status   Varicella-Zoster, PCR Negative Negative Final    Comment: (NOTE) No Varicella Zoster Virus DNA detected. This test was developed and its performance characteristics determined by [facility].  It has not been cleared or approved by the Food and Drug Administration.  The FDA has determined that such clearance or approval is not necessary. Performed At: [facility] [address] [place], [place] [id number] Jolene Schimke MD ON:[id number]   Culture, blood (Routine X 2) w Reflex to ID Panel     Status: None   Collection Time: 08/21/23  8:04 PM   Specimen: BLOOD LEFT ARM  Result Value Ref Range Status   Specimen Description BLOOD LEFT ARM  Final   Special Requests   Final    BOTTLES DRAWN AEROBIC AND ANAEROBIC Blood Culture results may not be optimal due to an inadequate volume  of blood received in culture bottles   Culture   Final    NO GROWTH 5 DAYS Performed at [facility], [address], [place], [place] [address]    Report Status 08/26/2023 FINAL  Final  Culture, blood (Routine X 2) w Reflex to ID Panel     Status: None   Collection Time: 08/21/23  8:08 PM   Specimen: BLOOD RIGHT ARM  Result Value Ref Range Status   Specimen Description BLOOD RIGHT ARM  Final   Special Requests   Final    BOTTLES DRAWN AEROBIC AND ANAEROBIC Blood Culture results may not be optimal due to an inadequate volume of blood received in culture bottles   Culture   Final    NO GROWTH 5 DAYS Performed at [facility], [address], [place], [place] [address]    Report Status 08/26/2023 FINAL  Final     Radiology Studies: No results found.  Scheduled Meds:  heparin  5,000 Units Subcutaneous Q8H   pantoprazole  40 mg Oral Q0600    polyethylene glycol  17 g Oral Daily   tenofovir alafenamide  25 mg Oral Daily   Continuous Infusions:  sodium chloride Stopped (08/29/23 0813)     LOS: 1 day   Alwyn Ren, MD 08/30/2023, 12:48 PM

## 2023-08-31 ENCOUNTER — Other Ambulatory Visit: Payer: Self-pay

## 2023-08-31 ENCOUNTER — Telehealth: Payer: Self-pay

## 2023-08-31 ENCOUNTER — Encounter: Payer: Self-pay | Admitting: Oncology

## 2023-08-31 LAB — COMPREHENSIVE METABOLIC PANEL
ALT: 79 U/L — ABNORMAL HIGH (ref 0–44)
ALT: 82 U/L — ABNORMAL HIGH (ref 0–44)
AST: 224 U/L — ABNORMAL HIGH (ref 15–41)
AST: 233 U/L — ABNORMAL HIGH (ref 15–41)
Albumin: 2.7 g/dL — ABNORMAL LOW (ref 3.5–5.0)
Albumin: 2.8 g/dL — ABNORMAL LOW (ref 3.5–5.0)
Alkaline Phosphatase: 83 U/L (ref 38–126)
Alkaline Phosphatase: 91 U/L (ref 38–126)
Anion gap: 8 (ref 5–15)
Anion gap: 10 (ref 5–15)
BUN: 5 mg/dL — ABNORMAL LOW (ref 6–20)
BUN: <5 mg/dL — ABNORMAL LOW (ref 6–20)
CO2: 24 mmol/L (ref 22–32)
CO2: 24 mmol/L (ref 22–32)
Calcium: 8.9 mg/dL (ref 8.9–10.3)
Calcium: 9.1 mg/dL (ref 8.9–10.3)
Chloride: 102 mmol/L (ref 98–111)
Chloride: 101 mmol/L (ref 98–111)
Creatinine, Ser: 0.63 mg/dL (ref 0.44–1.00)
Creatinine, Ser: 0.58 mg/dL (ref 0.44–1.00)
GFR, Estimated: >60 mL/min (ref >60)
GFR, Estimated: >60 mL/min (ref >60)
Glucose, Bld: 84 mg/dL (ref 70–99)
Glucose, Bld: 91 mg/dL (ref 70–99)
Potassium: 3.4 mmol/L — ABNORMAL LOW (ref 3.5–5.1)
Potassium: 3.7 mmol/L (ref 3.5–5.1)
Sodium: 134 mmol/L — ABNORMAL LOW (ref 135–145)
Sodium: 135 mmol/L (ref 135–145)
Total Bilirubin: 1.4 mg/dL — ABNORMAL HIGH (ref 0.0–1.2)
Total Bilirubin: 1.6 mg/dL — ABNORMAL HIGH (ref 0.0–1.2)
Total Protein: 8 g/dL (ref 6.5–8.1)
Total Protein: 8.5 g/dL — ABNORMAL HIGH (ref 6.5–8.1)

## 2023-08-31 LAB — CBC
HCT: 26.5 % — ABNORMAL LOW (ref 36.0–46.0)
HCT: 26.2 % — ABNORMAL LOW (ref 36.0–46.0)
Hemoglobin: 8.4 g/dL — ABNORMAL LOW (ref 12.0–15.0)
Hemoglobin: 8.4 g/dL — ABNORMAL LOW (ref 12.0–15.0)
MCH: 30.8 pg (ref 26.0–34.0)
MCH: 30.9 pg (ref 26.0–34.0)
MCHC: 31.7 g/dL (ref 30.0–36.0)
MCHC: 32.1 g/dL (ref 30.0–36.0)
MCV: 97.1 fL (ref 80.0–100.0)
MCV: 96.3 fL (ref 80.0–100.0)
Platelets: 371 10*3/uL (ref 150–400)
Platelets: 365 10*3/uL (ref 150–400)
RBC: 2.73 MIL/uL — ABNORMAL LOW (ref 3.87–5.11)
RBC: 2.72 MIL/uL — ABNORMAL LOW (ref 3.87–5.11)
RDW: 19.7 % — ABNORMAL HIGH (ref 11.5–15.5)
RDW: 19.2 % — ABNORMAL HIGH (ref 11.5–15.5)
WBC: 5.5 10*3/uL (ref 4.0–10.5)
WBC: 5.5 10*3/uL (ref 4.0–10.5)
nRBC: 0.4 % — ABNORMAL HIGH (ref 0.0–0.2)
nRBC: 0 % (ref 0.0–0.2)

## 2023-08-31 MED ORDER — ONDANSETRON HCL 4 MG PO TABS: 4.0000 mg | ORAL_TABLET | Freq: Every day | ORAL | 1 refills | Status: DC | PRN

## 2023-08-31 MED ORDER — PANTOPRAZOLE SODIUM 40 MG PO TBEC
40.0000 mg | DELAYED_RELEASE_TABLET | Freq: Every day | ORAL | 0 refills | Status: DC
Start: 2023-08-31 — End: 2023-10-23

## 2023-08-31 MED ORDER — PROCHLORPERAZINE MALEATE 10 MG PO TABS: 10.0000 mg | ORAL_TABLET | Freq: Four times a day (QID) | ORAL | 1 refills | Status: DC | PRN

## 2023-08-31 MED ORDER — OXYCODONE HCL 10 MG PO TABS: 10.0000 mg | ORAL_TABLET | Freq: Four times a day (QID) | ORAL | 0 refills | Status: DC | PRN

## 2023-08-31 NOTE — Plan of Care (Signed)
 Went over discharge instructions with patient, her son interpreted for me. She says she understood instructions. Vital signs stable, and PIV intact upon removal. She will be leaving by private vehicle.    Problem: Education: Goal: Knowledge of General Education information will improve Description: Including pain rating scale, medication(s)/side effects and non-pharmacologic comfort measures Outcome: Adequate for Discharge   Problem: Health Behavior/Discharge Planning: Goal: Ability to manage health-related needs will improve Outcome: Adequate for Discharge   Problem: Clinical Measurements: Goal: Ability to maintain clinical measurements within normal limits will improve Outcome: Adequate for Discharge Goal: Will remain free from infection Outcome: Adequate for Discharge Goal: Diagnostic test results will improve Outcome: Adequate for Discharge Goal: Respiratory complications will improve Outcome: Adequate for Discharge Goal: Cardiovascular complication will be avoided Outcome: Adequate for Discharge   Problem: Activity: Goal: Risk for activity intolerance will decrease Outcome: Adequate for Discharge   Problem: Nutrition: Goal: Adequate nutrition will be maintained Outcome: Adequate for Discharge   Problem: Coping: Goal: Level of anxiety will decrease Outcome: Adequate for Discharge   Problem: Elimination: Goal: Will not experience complications related to bowel motility Outcome: Adequate for Discharge Goal: Will not experience complications related to urinary retention Outcome: Adequate for Discharge   Problem: Pain Managment: Goal: General experience of comfort will improve and/or be controlled Outcome: Adequate for Discharge   Problem: Safety: Goal: Ability to remain free from injury will improve Outcome: Adequate for Discharge   Problem: Skin Integrity: Goal: Risk for impaired skin integrity will decrease Outcome: Adequate for Discharge

## 2023-08-31 NOTE — Discharge Summary (Addendum)
 Physician Discharge Summary  Christina Gutierrez WUJ:[id number] DOB: 1976/03/21 DOA: 08/28/2023  PCP: Meryl Crutch, MD  Admit date: 08/28/2023 Discharge date: 08/31/2023  Admitted From: home Disposition:  home  Recommendations for Outpatient Follow-up:  Follow up with PCP in 1-2 weeks Please obtain BMP/CBC in one week Please follow up  with ONC  Home Health: None Equipment/Devices: None Discharge Condition: Stable  CODE STATUS: Full code Diet recommendation: Cardiac Brief/Interim Summary:  48 year old female diagnosed with hepatitis B this month along with multifocal hepatocellular carcinoma admitted with diffuse abdominal pain intractable nausea and vomiting.  She has not had a biopsy since she had classic features of hepatocellular carcinoma on MRI.  They are however trying to get a skin biopsy.   MRI of the abdomen on August 21, 2023-showed numerous hepatic lesions are noted, with a dominant lesion which replaces much of the right lobe of the liver estimated to measure approximately 16.5 x 12.7 x 17.7 cm. This lesion is heterogeneous in signal intensity on T1 and T2 weighted images, but clearly has internal areas of hypervascular enhancement on early phase post gadolinium imaging, with persistent low-level enhancement on more delayed imaging. The other dominant lesion is centered in the superior aspect of the liver, likely within the superior aspect of segment 4A estimated to measure approximately 8.1 x 6.8 x 7.8 cm, with similar imaging characteristics to the previously described lesion, although with greater degree of internal washout and probable pseudo capsule on delayed imaging. Multiple other smaller hypervascular lesions are apparent on arterial phase imaging throughout all aspects of the liver, some of which demonstrate delayed washout.  She is not a candidate for transplant or surgical resection because of the extent of the disease.  Her alpha-fetoprotein level was  32,160. Denies diarrhea or constipation Kub 2/23 pending  Discharge Diagnoses:  Principal Problem:   Intractable pain    #1  Intractable nausea and vomiting in the setting of newly diagnosed multifocal hepatocellular carcinoma and hepatitis B infection.  She was treated with IVF zofran and phenergan.she was able to tolerate a diet prior to dc.  #2 multifocal hepatocellular carcinoma, seen on MRI Per medical oncology, not biopsied given classic features for hepatocellular carcinoma on MRI, planning for outpatient systemic immunotherapy.    #3 hepatitis B started on tenofovir 08/29/2023 for the first time.  This has been prescribed by ID as outpatient.   #4 cancer associated pain treated with Dilaudid and morphine patient was on oxycodone prior to admission .   #5 elevated LFTs   Due to hepatocellular ca.  Estimated body mass index is 23.51 kg/m as calculated from the following:   Height as of this encounter: 5' (1.524 m).   Weight as of this encounter: 54.6 kg.  Discharge Instructions  Discharge Instructions     Diet - low sodium heart healthy   Complete by: As directed    Increase activity slowly   Complete by: As directed       Allergies as of 08/31/2023   No Known Allergies      Medication List     STOP taking these medications    diphenhydrAMINE 25 mg capsule Commonly known as: BENADRYL   HYDROcodone-acetaminophen 5-325 MG tablet Commonly known as: NORCO/VICODIN   lidocaine-prilocaine cream Commonly known as: EMLA   OVER THE COUNTER MEDICATION   polyethylene glycol 17 g packet Commonly known as: MIRALAX / GLYCOLAX       TAKE these medications    ondansetron 4 MG tablet Commonly known as:  Zofran Take 1 tablet (4 mg total) by mouth daily as needed for nausea or vomiting. What changed:  medication strength how much to take when to take this   Oxycodone HCl 10 MG Tabs Take 1 tablet (10 mg total) by mouth every 6 (six) hours as needed. What  changed: reasons to take this   pantoprazole 40 MG tablet Commonly known as: PROTONIX Take 1 tablet (40 mg total) by mouth daily at 6 (six) AM.   prochlorperazine 10 MG tablet Commonly known as: COMPAZINE Take 1 tablet (10 mg total) by mouth every 6 (six) hours as needed for nausea or vomiting.   Vemlidy 25 MG tablet Generic drug: tenofovir alafenamide Tome 1 tableta (25 mg en total) por va oral diariamente. (Take 1 tablet (25 mg total) by mouth daily.)        No Known Allergies  Consultations: none   Procedures/Studies: DG Abd 1 View Result Date: 08/30/2023 CLINICAL DATA:  Abdominal pain EXAM: ABDOMEN - 1 VIEW COMPARISON:  None Available. FINDINGS: The bowel gas pattern is normal. No radio-opaque calculi or other significant radiographic abnormality are seen. IMPRESSION: Negative. Electronically Signed   By: Darliss Cheney M.D.   On: 08/30/2023 17:09   CT CHEST W CONTRAST Result Date: 08/23/2023 CLINICAL DATA:  Occult malignancy. Hepatocellular carcinoma, looking for metastatic lesion. * Tracking Code: BO * EXAM: CT CHEST WITH CONTRAST TECHNIQUE: Multidetector CT imaging of the chest was performed during intravenous contrast administration. RADIATION DOSE REDUCTION: This exam was performed according to the departmental dose-optimization program which includes automated exposure control, adjustment of the mA and/or kV according to patient size and/or use of iterative reconstruction technique. CONTRAST:  50mL OMNIPAQUE IOHEXOL 350 MG/ML SOLN COMPARISON:  None Available. FINDINGS: Cardiovascular: Normal cardiac size. No pericardial effusion. Small amount of fluid noted in the periaortic recesses. No aortic aneurysm. Mediastinum/Nodes: Visualized thyroid gland appears grossly unremarkable. No solid / cystic mediastinal masses. The esophagus is nondistended precluding optimal assessment. No axillary, mediastinal or hilar lymphadenopathy by size criteria. Lungs/Pleura: The central  tracheo-bronchial tree is patent. There are patchy areas of linear, plate-like atelectasis and/or scarring throughout bilateral lungs with asymmetric more involvement of the middle lobe. No mass or consolidation. No pleural effusion or pneumothorax. No suspicious lung nodules. Upper Abdomen: Redemonstration of large heterogeneous liver masses, incompletely characterized on the current exam. Please refer to MRI abdomen from 08/21/2023 for details. Remaining visualized upper abdominal viscera within normal limits. Musculoskeletal: The visualized soft tissues of the chest wall are grossly unremarkable. No suspicious osseous lesions. IMPRESSION: 1. No metastatic disease identified within the chest. No lung mass, consolidation, pleural effusion or pneumothorax. No suspicious lung nodule. 2. Multiple other nonacute observations, as described above. Electronically Signed   By: Jules Schick M.D.   On: 08/23/2023 09:54   MR LIVER W WO CONTRAST Result Date: 08/22/2023 CLINICAL DATA:  48 year old female with history of liver lesion concerning for potential hepatocellular carcinoma. Follow-up study. EXAM: MRI ABDOMEN WITHOUT AND WITH CONTRAST TECHNIQUE: Multiplanar multisequence MR imaging of the abdomen was performed both before and after the administration of intravenous contrast. CONTRAST:  10mL GADAVIST GADOBUTROL 1 MMOL/ML IV SOLN COMPARISON:  No prior abdominal MRI. CT of the abdomen and pelvis 08/21/2023. FINDINGS: Lower chest: Elevation of the right hemidiaphragm. Otherwise, unremarkable. Hepatobiliary: Numerous hepatic lesions are noted, with a dominant lesion which replaces much of the right lobe of the liver (axial image 51 of series 15 and coronal image 37 of series 17) estimated to measure approximately  16.5 x 12.7 x 17.7 cm. This lesion is heterogeneous in signal intensity on T1 and T2 weighted images, but clearly has internal areas of hypervascular enhancement on early phase post gadolinium imaging, with  persistent low-level enhancement on more delayed imaging. The other dominant lesion is centered in the superior aspect of the liver, likely within the superior aspect of segment 4A (axial image 19 of series 15 and coronal image 53 of series 17) estimated to measure approximately 8.1 x 6.8 x 7.8 cm, with similar imaging characteristics to the previously described lesion, although with greater degree of internal washout and probable pseudo capsule on delayed imaging. Multiple other smaller hypervascular lesions are apparent on arterial phase imaging throughout all aspects of the liver, some of which demonstrate delayed washout. Some amorphous T1 hyperintense material and T2 hypointense material is noted within the lumen of the gallbladder, likely biliary sludge. Gallbladder is not distended. No pericholecystic fluid or surrounding inflammatory changes. Mild intrahepatic biliary ductal dilatation noted in the left lobe of the liver likely secondary to compression of the central bile ducts from the hepatic masses. Common bile duct is normal in caliber measuring 5 mm in the porta hepatis. Pancreas: No pancreatic mass. No pancreatic ductal dilatation. No pancreatic or peripancreatic fluid collections or inflammatory changes. Spleen:  Unremarkable. Adrenals/Urinary Tract: Small T1 hypointense, T2 hyperintense, nonenhancing lesions in both kidneys compatible with simple (Bosniak class 1) cysts, which require no imaging follow-up. No aggressive appearing renal lesions. No hydroureteronephrosis. Left adrenal gland is unremarkable in appearance. Right adrenal gland is not confidently identified, likely compressed secondary to the adjacent large hepatic mass. Stomach/Bowel: Visualized portions are unremarkable. Vascular/Lymphatic: No aneurysm identified in the visualized abdominal vasculature. No lymphadenopathy noted in the abdomen. Other:  No significant volume of ascites. Musculoskeletal: No aggressive appearing osseous  lesions are noted in the visualized portions of the skeleton. IMPRESSION: 1. Multiple aggressive appearing hepatic lesions, as above, with imaging characteristics most suggestive of multifocal hepatocellular carcinoma. 2. Biliary sludge in the gallbladder. No findings to suggest an acute cholecystitis at this time. 3. Mild intrahepatic biliary ductal dilatation in the left lobe of the liver, likely secondary to compression of the central bile ducts from the hepatic masses. Electronically Signed   By: Trudie Reed M.D.   On: 08/22/2023 09:10   CT ABDOMEN PELVIS W CONTRAST Result Date: 08/21/2023 CLINICAL DATA:  Right side abdominal pain EXAM: CT ABDOMEN AND PELVIS WITH CONTRAST TECHNIQUE: Multidetector CT imaging of the abdomen and pelvis was performed using the standard protocol following bolus administration of intravenous contrast. RADIATION DOSE REDUCTION: This exam was performed according to the departmental dose-optimization program which includes automated exposure control, adjustment of the mA and/or kV according to patient size and/or use of iterative reconstruction technique. CONTRAST:  75mL OMNIPAQUE IOHEXOL 350 MG/ML SOLN COMPARISON:  08/08/2017 FINDINGS: Lower chest: No acute abnormality Hepatobiliary: There is a large mass involving the right hepatic lobe which is new since prior study. This measures 15.5 x 12.5 cm. Areas of central low-density noted within the mass which could reflect scar. The mass is isodense to the remainder the liver on portal venous phase imaging. Possible large feeding vessel noted posteromedially. Gallbladder unremarkable. Pancreas: No focal abnormality or ductal dilatation. Spleen: No focal abnormality.  Normal size. Adrenals/Urinary Tract: No suspicious renal or adrenal lesion. No stones or hydronephrosis. Urinary bladder unremarkable. Stomach/Bowel: Normal appendix. Stomach, large and small bowel grossly unremarkable. Vascular/Lymphatic: No evidence of aneurysm or  adenopathy. There is a spontaneous left splenorenal  shunt with suggest portal venous hypertension. This may be due to mass effect and compression on the main portal vein from the large right hepatic mass. Reproductive: Uterus and adnexa unremarkable.  No mass. Other: No free fluid or free air. Musculoskeletal: No acute bony abnormality. IMPRESSION: 15.5 x 12.5 cm solid well-circumscribed mass within the right hepatic lobe. Areas of central low-density suggest possibility of central scar. There may be a large feeding vessel. Favor focal nodular hyperplasia although fibrolamellar HCC can have a similar appearance. Recommend further evaluation MRI without and with contrast. Left spontaneous splenorenal shunt suggest portal venous hypertension. No evidence for cirrhosis. This may be related to mass effect and compression of the portal vein from the large mass. Electronically Signed   By: Charlett Nose M.D.   On: 08/21/2023 01:38   US Abdomen Limited RUQ (LIVER/GB) Result Date: 08/20/2023 CLINICAL DATA:  Right upper quadrant pain EXAM: ULTRASOUND ABDOMEN LIMITED RIGHT UPPER QUADRANT COMPARISON:  CT 08/08/2017 FINDINGS: Gallbladder: No gallstones or wall thickening visualized. No sonographic Murphy sign noted by sonographer. Common bile duct: Diameter: 2.2 mm Liver: Large heterogeneous solid mass measuring 14.8 x 16.4 x 16.9 cm. Portal vein is patent on color Doppler imaging with possible reversal of flow. Other: None. IMPRESSION: 1. Large heterogeneous solid mass within the liver measuring up to 16.9 cm, suspicious for malignancy. Recommend further evaluation with contrast-enhanced CT or MRI. 2. Possible reversal of flow in the portal vein. Electronically Signed   By: Jasmine Pang M.D.   On: 08/20/2023 19:04   (Echo, Carotid, EGD, Colonoscopy, ERCP)    Subjective: Anxious to go home  Discharge Exam: Vitals:   08/30/23 2057 08/31/23 0447  BP: 121/69 108/71  Pulse: 86 79  Resp: 18 18  Temp: 98.8 F (37.1  C) 98.1 F (36.7 C)  SpO2: 100% 100%   Vitals:   08/30/23 0532 08/30/23 1409 08/30/23 2057 08/31/23 0447  BP: 118/75 109/74 121/69 108/71  Pulse: 93 86 86 79  Resp: 18 18 18 18   Temp: 98.4 F (36.9 C) 98.3 F (36.8 C) 98.8 F (37.1 C) 98.1 F (36.7 C)  TempSrc: Oral Oral Oral Oral  SpO2: 99% 97% 100% 100%  Weight:      Height:        General: Pt is alert, awake, not in acute distress Cardiovascular: RRR, S1/S2 +, no rubs, no gallops Respiratory: CTA bilaterally, no wheezing, no rhonchi Abdominal: Soft, NT, ND, bowel sounds + Extremities: no edema, no cyanosis    The results of significant diagnostics from this hospitalization (including imaging, microbiology, ancillary and laboratory) are listed below for reference.     Microbiology: Recent Results (from the past 240 hours)  Culture, blood (Routine X 2) w Reflex to ID Panel     Status: None   Collection Time: 08/21/23  8:04 PM   Specimen: BLOOD LEFT ARM  Result Value Ref Range Status   Specimen Description BLOOD LEFT ARM  Final   Special Requests   Final    BOTTLES DRAWN AEROBIC AND ANAEROBIC Blood Culture results may not be optimal due to an inadequate volume of blood received in culture bottles   Culture   Final    NO GROWTH 5 DAYS Performed at [facility], [address], [place], [place] [address]    Report Status 08/26/2023 FINAL  Final  Culture, blood (Routine X 2) w Reflex to ID Panel     Status: None   Collection Time: 08/21/23  8:08 PM  Specimen: BLOOD RIGHT ARM  Result Value Ref Range Status   Specimen Description BLOOD RIGHT ARM  Final   Special Requests   Final    BOTTLES DRAWN AEROBIC AND ANAEROBIC Blood Culture results may not be optimal due to an inadequate volume of blood received in culture bottles   Culture   Final    NO GROWTH 5 DAYS Performed at [facility], [address] [place], [place] [address]    Report Status 08/26/2023 FINAL  Final     Labs: BNP (last 3  results) No results for input(s): "BNP" in the last 8760 hours. Basic Metabolic Panel: Recent Labs  Lab 08/27/23 1025 08/28/23 2309 08/29/23 0504 08/31/23 0536 08/31/23 0731  NA 134* 133* 136 134* 135  K 4.0 4.7 3.9 3.4* 3.7  CL 101 100 106 102 101  CO2 26 22 22 24 24   GLUCOSE 90 95 76 84 91  BUN 9 10 8  5* <5*  CREATININE 0.59 0.59 0.60 0.63 0.58  CALCIUM 9.3 9.1 8.6* 8.9 9.1  MG  --   --  2.1  --   --   PHOS  --   --  3.2  --   --    Liver Function Tests: Recent Labs  Lab 08/27/23 1025 08/28/23 2309 08/29/23 0504 08/31/23 0536 08/31/23 0731  AST 196* 245* 201* 224* 233*  ALT 69* 81* 70* 79* 82*  ALKPHOS 106 99 85 83 91  BILITOT 1.6* 1.7* 1.2 1.4* 1.6*  PROT 8.6* 8.8* 7.7 8.0 8.5*  ALBUMIN 3.5 3.0* 2.6* 2.7* 2.8*   Recent Labs  Lab 08/28/23 2309  LIPASE 51   Recent Labs  Lab 08/27/23 1024  AMMONIA 18   CBC: Recent Labs  Lab 08/27/23 1025 08/28/23 2309 08/29/23 0504 08/31/23 0536 08/31/23 0731  WBC 5.7 5.3 5.4 5.5 5.5  NEUTROABS 2.9 3.6  --   --   --   HGB 9.1* 8.6* 7.6* 8.4* 8.4*  HCT 27.9* 27.8* 24.2* 26.5* 26.2*  MCV 95.5 97.9 96.4 97.1 96.3  PLT 381 381 334 371 365   Cardiac Enzymes: No results for input(s): "CKTOTAL", "CKMB", "CKMBINDEX", "TROPONINI" in the last 168 hours. BNP: Invalid input(s): "POCBNP" CBG: No results for input(s): "GLUCAP" in the last 168 hours. D-Dimer No results for input(s): "DDIMER" in the last 72 hours. Hgb A1c No results for input(s): "HGBA1C" in the last 72 hours. Lipid Profile No results for input(s): "CHOL", "HDL", "LDLCALC", "TRIG", "CHOLHDL", "LDLDIRECT" in the last 72 hours. Thyroid function studies No results for input(s): "TSH", "T4TOTAL", "T3FREE", "THYROIDAB" in the last 72 hours.  Invalid input(s): "FREET3" Anemia work up No results for input(s): "VITAMINB12", "FOLATE", "FERRITIN", "TIBC", "IRON", "RETICCTPCT" in the last 72 hours. Urinalysis    Component Value Date/Time   COLORURINE AMBER (A)  08/21/2023 0253   APPEARANCEUR CLEAR 08/21/2023 0253   LABSPEC 1.040 (H) 08/21/2023 0253   PHURINE 6.0 08/21/2023 0253   GLUCOSEU NEGATIVE 08/21/2023 0253   HGBUR NEGATIVE 08/21/2023 0253   BILIRUBINUR NEGATIVE 08/21/2023 0253   KETONESUR NEGATIVE 08/21/2023 0253   PROTEINUR NEGATIVE 08/21/2023 0253   NITRITE NEGATIVE 08/21/2023 0253   LEUKOCYTESUR NEGATIVE 08/21/2023 0253   Sepsis Labs Recent Labs  Lab 08/28/23 2309 08/29/23 0504 08/31/23 0536 08/31/23 0731  WBC 5.3 5.4 5.5 5.5   Microbiology Recent Results (from the past 240 hours)  Culture, blood (Routine X 2) w Reflex to ID Panel     Status: None   Collection Time: 08/21/23  8:04  PM   Specimen: BLOOD LEFT ARM  Result Value Ref Range Status   Specimen Description BLOOD LEFT ARM  Final   Special Requests   Final    BOTTLES DRAWN AEROBIC AND ANAEROBIC Blood Culture results may not be optimal due to an inadequate volume of blood received in culture bottles   Culture   Final    NO GROWTH 5 DAYS Performed at [facility], [address], [place], [place] [address]    Report Status 08/26/2023 FINAL  Final  Culture, blood (Routine X 2) w Reflex to ID Panel     Status: None   Collection Time: 08/21/23  8:08 PM   Specimen: BLOOD RIGHT ARM  Result Value Ref Range Status   Specimen Description BLOOD RIGHT ARM  Final   Special Requests   Final    BOTTLES DRAWN AEROBIC AND ANAEROBIC Blood Culture results may not be optimal due to an inadequate volume of blood received in culture bottles   Culture   Final    NO GROWTH 5 DAYS Performed at [facility], [address] [place], [place] [address]    Report Status 08/26/2023 FINAL  Final    SIGNED: Time spend more than 30 min  Alwyn Ren, MD  [facility] 08/31/2023, 2:19 PM

## 2023-08-31 NOTE — Addendum Note (Signed)
 Addended by: Amaryllis Dyke on: 08/31/2023 10:14 AM   Modules accepted: Orders

## 2023-08-31 NOTE — Telephone Encounter (Signed)
 [facility] Patient Advocate Encounter  Patient's medications Wynonia Sours have been couriered to [facility] from [employer] and will be picked up on 08/31/23.  Clearance Coots, CPhT Specialty Pharmacy Patient [facility] Phone: [phone] Fax:  [phone]

## 2023-09-01 ENCOUNTER — Telehealth: Payer: Self-pay | Admitting: Oncology

## 2023-09-01 NOTE — Telephone Encounter (Signed)
 Scheduled patient's appointment for education. Patient is aware of location details.

## 2023-09-02 ENCOUNTER — Other Ambulatory Visit: Payer: Self-pay | Admitting: *Deleted

## 2023-09-02 ENCOUNTER — Other Ambulatory Visit: Payer: Self-pay

## 2023-09-02 NOTE — Progress Notes (Signed)
 The proposed treatment discussed in conference is for discussion purpose only and is not a binding recommendation.  The patients have not been physically examined, or presented with their treatment options.  Therefore, final treatment plans cannot be decided.

## 2023-09-04 ENCOUNTER — Inpatient Hospital Stay: Payer: Self-pay

## 2023-09-04 NOTE — Progress Notes (Signed)
 Pharmacist Chemotherapy Monitoring - Initial Assessment    Anticipated start date: 09/11/23   The following has been reviewed per standard work regarding the patient's treatment regimen: The patient's diagnosis, treatment plan and drug doses, and organ/hematologic function Lab orders and baseline tests specific to treatment regimen  The treatment plan start date, drug sequencing, and pre-medications Prior authorization status  Patient's documented medication list, including drug-drug interaction screen and prescriptions for anti-emetics and supportive care specific to the treatment regimen The drug concentrations, fluid compatibility, administration routes, and timing of the medications to be used The patient's access for treatment and lifetime cumulative dose history, if applicable  The patient's medication allergies and previous infusion related reactions, if applicable   Changes made to treatment plan:  N/A  Follow up needed:  Pending patient assistance approval   Stephens Shire, [facility], 09/04/2023  11:34 AM

## 2023-09-07 ENCOUNTER — Telehealth: Payer: Self-pay

## 2023-09-07 NOTE — Telephone Encounter (Signed)
 With the use of [employer], returned patient's call from last Saturday. Patient had called and spoke with triage reporting abdominal pain of 10/10 with vomiting.   After speaking with patient via interpreter, patient denies having any pain or nausea today and is eating and drinking fine. Patient was instructed to go to ED by triage nurse as per "Telephone Advice Record," but patient reports she did not go.  Dr. Arlana Pouch aware of event and knows this has since resolved. Patient was also reminded of her Lab and Established patient visit this Friday along with both times for appointments.

## 2023-09-11 ENCOUNTER — Encounter: Payer: Self-pay | Admitting: Oncology

## 2023-09-11 ENCOUNTER — Inpatient Hospital Stay: Payer: Self-pay

## 2023-09-11 ENCOUNTER — Other Ambulatory Visit: Payer: Self-pay

## 2023-09-11 ENCOUNTER — Inpatient Hospital Stay: Payer: Self-pay | Admitting: Oncology

## 2023-09-11 ENCOUNTER — Inpatient Hospital Stay: Payer: Self-pay | Attending: Oncology

## 2023-09-11 VITALS — BP 108/75 | HR 81 | Temp 98.2°F | Resp 16 | Wt 119.6 lb

## 2023-09-11 DIAGNOSIS — C220 Liver cell carcinoma: Secondary | ICD-10-CM

## 2023-09-11 DIAGNOSIS — G893 Neoplasm related pain (acute) (chronic): Secondary | ICD-10-CM

## 2023-09-11 DIAGNOSIS — B181 Chronic viral hepatitis B without delta-agent: Secondary | ICD-10-CM | POA: Insufficient documentation

## 2023-09-11 DIAGNOSIS — Z79899 Other long term (current) drug therapy: Secondary | ICD-10-CM | POA: Insufficient documentation

## 2023-09-11 DIAGNOSIS — D649 Anemia, unspecified: Secondary | ICD-10-CM | POA: Insufficient documentation

## 2023-09-11 DIAGNOSIS — Z5112 Encounter for antineoplastic immunotherapy: Secondary | ICD-10-CM | POA: Insufficient documentation

## 2023-09-11 DIAGNOSIS — Z79624 Long term (current) use of inhibitors of nucleotide synthesis: Secondary | ICD-10-CM | POA: Insufficient documentation

## 2023-09-11 LAB — CMP (CANCER CENTER ONLY)
ALT: 89 U/L — ABNORMAL HIGH (ref 0–44)
AST: 214 U/L (ref 15–41)
Albumin: 3.6 g/dL (ref 3.5–5.0)
Alkaline Phosphatase: 113 U/L (ref 38–126)
Anion gap: 5 (ref 5–15)
BUN: 8 mg/dL (ref 6–20)
CO2: 27 mmol/L (ref 22–32)
Calcium: 9.2 mg/dL (ref 8.9–10.3)
Chloride: 103 mmol/L (ref 98–111)
Creatinine: 0.61 mg/dL (ref 0.44–1.00)
GFR, Estimated: >60 mL/min (ref >60)
Glucose, Bld: 90 mg/dL (ref 70–99)
Potassium: 4.3 mmol/L (ref 3.5–5.1)
Sodium: 135 mmol/L (ref 135–145)
Total Bilirubin: 0.9 mg/dL (ref 0.0–1.2)
Total Protein: 8.5 g/dL — ABNORMAL HIGH (ref 6.5–8.1)

## 2023-09-11 LAB — CBC WITH DIFFERENTIAL (CANCER CENTER ONLY)
Abs Immature Granulocytes: 0.02 10*3/uL (ref 0.00–0.07)
Basophils Absolute: 0.1 10*3/uL (ref 0.0–0.1)
Basophils Relative: 2 %
Eosinophils Absolute: 0.3 10*3/uL (ref 0.0–0.5)
Eosinophils Relative: 6 %
HCT: 26.9 % — ABNORMAL LOW (ref 36.0–46.0)
Hemoglobin: 8.8 g/dL — ABNORMAL LOW (ref 12.0–15.0)
Immature Granulocytes: 0 %
Lymphocytes Relative: 28 %
Lymphs Abs: 1.5 10*3/uL (ref 0.7–4.0)
MCH: 30.1 pg (ref 26.0–34.0)
MCHC: 32.7 g/dL (ref 30.0–36.0)
MCV: 92.1 fL (ref 80.0–100.0)
Monocytes Absolute: 0.5 10*3/uL (ref 0.1–1.0)
Monocytes Relative: 10 %
Neutro Abs: 2.9 10*3/uL (ref 1.7–7.7)
Neutrophils Relative %: 54 %
Platelet Count: 303 10*3/uL (ref 150–400)
RBC: 2.92 MIL/uL — ABNORMAL LOW (ref 3.87–5.11)
RDW: 17.5 % — ABNORMAL HIGH (ref 11.5–15.5)
WBC Count: 5.4 10*3/uL (ref 4.0–10.5)
nRBC: 0 % (ref 0.0–0.2)

## 2023-09-11 LAB — TOTAL PROTEIN, URINE DIPSTICK: Protein, ur: NEGATIVE mg/dL

## 2023-09-11 LAB — TSH: TSH: 3.271 u[IU]/mL (ref 0.350–4.500)

## 2023-09-11 LAB — PREGNANCY, URINE: Preg Test, Ur: NEGATIVE

## 2023-09-11 MED ORDER — SODIUM CHLORIDE 0.9 % IV SOLN
1200.0000 mg | Freq: Once | INTRAVENOUS | Status: AC
  Administered 2023-09-11: 1200 mg via INTRAVENOUS
  Filled 2023-09-11: qty 20

## 2023-09-11 MED ORDER — SODIUM CHLORIDE 0.9 % IV SOLN
15.0000 mg/kg | Freq: Once | INTRAVENOUS | Status: AC
  Administered 2023-09-11: 800 mg via INTRAVENOUS
  Filled 2023-09-11: qty 32

## 2023-09-11 MED ORDER — SODIUM CHLORIDE 0.9 % IV SOLN: INTRAVENOUS | Status: DC

## 2023-09-11 NOTE — Assessment & Plan Note (Addendum)
 Please review oncology history for additional details and timeline of events.  MRI of the abdomen with liver protocol on 08/21/2023 showed numerous hepatic lesions are noted, with a dominant lesion which replaces much of the right lobe of the liver estimated to measure approximately 16.5 x 12.7 x 17.7 cm. This lesion is heterogeneous in signal intensity on T1 and T2 weighted images, but clearly has internal areas of hypervascular enhancement on early phase post gadolinium imaging, with persistent low-level enhancement on more delayed imaging. The other dominant lesion is centered in the superior aspect of the liver, likely within the superior aspect of segment 4A estimated to measure approximately 8.1 x 6.8 x 7.8 cm, with similar imaging characteristics to the previously described lesion, although with greater degree of internal washout and probable pseudo capsule on delayed imaging. Multiple other smaller hypervascular lesions are apparent on arterial phase imaging throughout all aspects of the liver, some of which demonstrate delayed washout.   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  This was confirmed during GI tumor conference on 09/09/2023.  -Previously undiagnosed hepatitis B could have been the contributing factor. -Not a candidate for liver transplant or surgical resection because of the extent of disease. -Discussed diagnosis, prognosis, plan of care, treatment options.  Reviewed NCCN guidelines.   On 08/21/2023, AFP was significantly elevated at 32,160 ng/mL.     CT chest on 08/23/2023 showed no evidence of intrathoracic metastatic disease.  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  Plan made for systemic treatments with atezolizumab plus bevacizumab.  She has had chemo education already.  Labs today reveal hemoglobin of 8.8, slightly better compared to prior.  Normal white count and platelet count.  AST 214, slightly better, ALT increased at 89.  Total bilirubin is now  better at 0.9.  No dose-limiting toxicities.  Will proceed with cycle 1 of atezolizumab plus bevacizumab today (09/11/2023).  Request placed for Port-A-Cath placement.  RTC in 3 weeks for follow-up, cycle 2 of treatment.

## 2023-09-11 NOTE — Progress Notes (Signed)
 CRITICAL VALUE STICKER  CRITICAL VALUE:  AST: 214  RECEIVER (on-site recipient of call): Alfonzo Feller, RN  DATE & TIME NOTIFIED: 09/11/2023 1040  MESSENGER (representative from lab): Junius Argyle  MD NOTIFIED: Yes  TIME OF NOTIFICATION:  1043  RESPONSE:  "OK to Tx"   Patient seen by Dr. Archie Patten Pasam today  Vitals are within treatment parameters: Yes  Labs are within treatment parameters: No (Please specify and give further instructions.)  Patient's AST is 214 and Dr. Arlana Pouch approved tx. Treatment plan has been signed: Yes   Per physician team, Patient is ready for treatment and there are NO modifications to the treatment plan.

## 2023-09-11 NOTE — Progress Notes (Addendum)
 [facility]  ONCOLOGY CLINIC PROGRESS NOTE   Patient Care Team: Meryl Crutch, MD as PCP - General (Oncology)  PATIENT NAME: Christina [facility]   MR#: [id number] DOB: 15-Nov-1975  Date of visit: 09/11/2023   ASSESSMENT & PLAN:   Christina Gut[facility]rez is a 48 y.o. Hispanic lady with no significant past medical history was hospitalized on 08/21/2023 after she presented with worsening right-sided abdominal pain, nausea, vomiting, decreased oral intake.  Workup including MRI of the abdomen showed evidence of multifocal hepatocellular carcinoma.  She was diagnosed with hepatitis B infection during this hospital admission.   Hepatocellular carcinoma (HCC) Please review oncology history for additional details and timeline of events.  MRI of the abdomen with liver protocol on 08/21/2023 showed numerous hepatic lesions are noted, with a dominant lesion which replaces much of the right lobe of the liver estimated to measure approximately 16.5 x 12.7 x 17.7 cm. This lesion is heterogeneous in signal intensity on T1 and T2 weighted images, but clearly has internal areas of hypervascular enhancement on early phase post gadolinium imaging, with persistent low-level enhancement on more delayed imaging. The other dominant lesion is centered in the superior aspect of the liver, likely within the superior aspect of segment 4A estimated to measure approximately 8.1 x 6.8 x 7.8 cm, with similar imaging characteristics to the previously described lesion, although with greater degree of internal washout and probable pseudo capsule on delayed imaging. Multiple other smaller hypervascular lesions are apparent on arterial phase imaging throughout all aspects of the liver, some of which demonstrate delayed washout.   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  This was confirmed during GI tumor conference on 09/09/2023.  -Previously undiagnosed hepatitis B could have been  the contributing factor. -Not a candidate for liver transplant or surgical resection because of the extent of disease. -Discussed diagnosis, prognosis, plan of care, treatment options.  Reviewed NCCN guidelines.   On 08/21/2023, AFP was significantly elevated at 32,160 ng/mL.     CT chest on 08/23/2023 showed no evidence of intrathoracic metastatic disease.  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  Plan made for systemic treatments with atezolizumab plus bevacizumab.  She has had chemo education already.  Labs today reveal hemoglobin of 8.8, slightly better compared to prior.  Normal white count and platelet count.  AST 214, slightly better, ALT increased at 89.  Total bilirubin is now better at 0.9.  No dose-limiting toxicities.  Will proceed with cycle 1 of atezolizumab plus bevacizumab today (09/11/2023).  Request placed for Port-A-Cath placement.  RTC in 3 weeks for follow-up, cycle 2 of treatment.  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 - Prescribed oxycodone, start with 2-3 tablets per day, up to 4 tablets if needed - Sent prescription to [facility] at [facility] and [address]  Patient has not filled oxycodone prescription yet.  She will fill it in the next couple of days.  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 Awaiting authorization for tenofovir. - Follow up with infectious disease specialists for tenofovir authorization - Monitor liver function tests and ammonia levels    I reviewed lab results and outside records for this visit and discussed relevant results with the patient. Diagnosis, plan of care and treatment options were also discussed in detail with the patient. Opportunity provided to ask questions and answers provided to  her apparent satisfaction. Provided instructions to  call our clinic with any problems, questions or concerns prior to return visit. I recommended to continue follow-up with PCP and sub-specialists. She verbalized understanding and agreed with the plan.   NCCN guidelines have been consulted in the planning of this patient's care.  I spent a total of 40 minutes during this encounter with the patient including review of chart and various tests results, discussions about plan of care and coordination of care plan.   Meryl Crutch, MD  09/11/2023 2:29 PM  [name] - [facility] DEPT OF [facility] [address] [place] [address] Dept: [phone] Dept Fax: [phone]    CHIEF COMPLAINT/ REASON FOR VISIT:   Multifocal hepatocellular carcinoma, diagnosed based on MRI findings.  Current Treatment: Systemic treatment with atezolizumab plus bevacizumab starting from 09/11/2023.  INTERVAL HISTORY:    Discussed the use of AI scribe software for clinical note transcription with the patient, who gave verbal consent to proceed.  Interview conducted with the help of an interpreter.  Christina Gutierrez is here today for repeat clinical assessment.   She reports intermittent abdominal pain.  The patient has been managing the pain with Tylenol, but she has a prescription for oxycodone that has not yet been filled. She was advised to avoid greasy and spicy foods due to potential stomach upset.  I have reviewed the past medical history, past surgical history, social history and family history with the patient and they are unchanged from previous note.  HISTORY OF PRESENT ILLNESS:   ONCOLOGY HISTORY:   48 y.o. Hispanic lady with no significant past medical history, presented to the emergency department on 08/21/2023 with complaints of right-sided abdominal pain, nausea, vomiting and decreased oral intake for at least 10 days prior to arrival.   On arrival to ED, she was found to  have anemia with hemoglobin of 8, white count normal at 6100, platelet count 316,000.  CMP showed elevated AST of 80, ALT increased at 79, bilirubin elevated at 1.8.  Elevated ammonia level of 62.   Right upper quadrant ultrasound showed large heterogenoussolid mass within the liver measuring up to 16.9 cm, suspicious for malignancy. Recommend further evaluation with contrast-enhanced CT or MRI.   CT abdomen pelvis showed: 15.5 x 12.5 cm solid well-circumscribed mass within the right hepatic lobe. Areas of central low-density suggest possibility of central scar. There may be a large feeding vessel. Favor focal nodular hyperplasia although fibrolamellar HCC can have a similar appearance. Recommend further evaluation MRI without and with contrast.   Left spontaneous splenorenal shunt suggest portal venous hypertension. No evidence for cirrhosis. This may be related to mass effect and compression of the portal vein from the large mass.   She was recently diagnosed with chickenpox about 20 days prior to this hospitalization.  Still had some itching from vesicles.   She was admitted for further evaluation and management.   MRI of the abdomen with liver protocol on 08/21/2023 showed numerous hepatic lesions are noted, with a dominant lesion which replaces much of the right lobe of the liver estimated to measure approximately 16.5 x 12.7 x 17.7 cm. This lesion is heterogeneous in signal intensity on T1 and T2 weighted images, but clearly has internal areas of hypervascular enhancement on early phase post gadolinium imaging, with persistent low-level enhancement on more delayed imaging. The other dominant lesion is centered in the superior aspect of the liver, likely  within the superior aspect of segment 4A estimated to measure approximately 8.1 x 6.8 x 7.8 cm, with similar imaging characteristics to the previously described lesion, although with greater degree of internal washout and probable pseudo capsule on  delayed imaging. Multiple other smaller hypervascular lesions are apparent on arterial phase imaging throughout all aspects of the liver, some of which demonstrate delayed washout.   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   On 08/21/2023, AFP was significantly elevated at 32,160 ng/mL.     She was also diagnosed with hepatitis B infection on additional workup.   CT chest on 08/23/2023 showed no evidence of intrathoracic metastatic disease.   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  Her case was discussed in GI tumor conference on 09/09/2023.  Reviewed images with the team and confirmed multifocal hepatocellular carcinoma.   Plan made for systemic treatments with atezolizumab plus bevacizumab.  Started from 09/11/2023.  Oncology History  Hepatocellular carcinoma (HCC)  08/22/2023 Initial Diagnosis   Hepatocellular carcinoma (HCC)   08/27/2023 Cancer Staging   Staging form: Liver, AJCC 8th Edition - Clinical: Stage IIIA (cT3, cN0, cM0) - Signed by Meryl Crutch, MD on 08/27/2023   09/11/2023 -  Chemotherapy   Patient is on Treatment Plan : HEPATOCELLULAR Atezolizumab + Bevacizumab q21d         REVIEW OF SYSTEMS:   Review of Systems  Gastrointestinal:  Positive for abdominal pain and nausea.  Skin:  Positive for itching (diffuse, intermittemt).  Neurological:  Positive for dizziness.    All other pertinent systems were reviewed with the patient and are negative.  ALLERGIES: She has no known allergies.  MEDICATIONS:  Current Outpatient Medications  Medication Sig Dispense Refill   ondansetron (ZOFRAN) 4 MG tablet Take 1 tablet (4 mg total) by mouth daily as needed for nausea or vomiting. 30 tablet 1   pantoprazole (PROTONIX) 40 MG tablet Take 1 tablet (40 mg total) by mouth daily at 6 (six) AM. 30 tablet 0   prochlorperazine (COMPAZINE) 10 MG tablet Take 1 tablet (10 mg total) by mouth every 6 (six) hours as needed for nausea or vomiting. 30 tablet 1   tenofovir  alafenamide (VEMLIDY) 25 MG tablet Take 1 tablet (25 mg total) by mouth daily. 30 tablet 4   Oxycodone HCl 10 MG TABS Take 1 tablet (10 mg total) by mouth every 6 (six) hours as needed. (Patient not taking: Reported on 09/11/2023) 90 tablet 0   No current facility-administered medications for this visit.   Facility-Administered Medications Ordered in Other Visits  Medication Dose Route Frequency Provider Last Rate Last Admin   0.9 %  sodium chloride infusion   Intravenous Continuous Rosemae Mcquown, MD   Stopped at 09/11/23 1413     VITALS:   Blood pressure 108/75, pulse 81, temperature 98.2 F (36.8 C), temperature source Temporal, resp. rate 16, weight 119 lb 9.6 oz (54.3 kg), SpO2 100%.  Wt Readings from Last 3 Encounters:  09/11/23 119 lb 9.6 oz (54.3 kg)  08/29/23 120 lb 5.9 oz (54.6 kg)  08/27/23 120 lb 6.4 oz (54.6 kg)    Body mass index is 23.36 kg/m.    Onc Performance Status - 09/11/23 1010       ECOG Perf Status   ECOG Perf Status Ambulatory and capable of all selfcare but unable to carry out any work activities.  Up and about more than 50% of waking hours      KPS SCALE   KPS % SCORE  Cares for self, unable to carry on normal activity or to do active work             PHYSICAL EXAM:   Physical Exam Constitutional:      General: She is not in acute distress.    Appearance: Normal appearance.  HENT:     Head: Normocephalic and atraumatic.  Eyes:     General: No scleral icterus.    Conjunctiva/sclera: Conjunctivae normal.  Cardiovascular:     Rate and Rhythm: Normal rate and regular rhythm.     Heart sounds: Normal heart sounds.  Pulmonary:     Effort: Pulmonary effort is normal.     Breath sounds: Normal breath sounds.  Abdominal:     Palpations: There is mass (hepatomegaly).     Tenderness: There is abdominal tenderness (in RUQ area).  Musculoskeletal:     Right lower leg: No edema.     Left lower leg: No edema.  Neurological:     General: No focal  deficit present.     Mental Status: She is alert.  Psychiatric:        Mood and Affect: Mood normal.        Behavior: Behavior normal.      LABORATORY DATA:   I have reviewed the data as listed.  Results for orders placed or performed in visit on 09/11/23  CBC with Differential (Cancer Center Only)  Result Value Ref Range   WBC Count 5.4 4.0 - 10.5 K/uL   RBC 2.92 (L) 3.87 - 5.11 MIL/uL   Hemoglobin 8.8 (L) 12.0 - 15.0 g/dL   HCT 16.1 (L) 09.6 - 04.5 %   MCV 92.1 80.0 - 100.0 fL   MCH 30.1 26.0 - 34.0 pg   MCHC 32.7 30.0 - 36.0 g/dL   RDW 40.9 (H) 81.1 - 91.4 %   Platelet Count 303 150 - 400 K/uL   nRBC 0.0 0.0 - 0.2 %   Neutrophils Relative % 54 %   Neutro Abs 2.9 1.7 - 7.7 K/uL   Lymphocytes Relative 28 %   Lymphs Abs 1.5 0.7 - 4.0 K/uL   Monocytes Relative 10 %   Monocytes Absolute 0.5 0.1 - 1.0 K/uL   Eosinophils Relative 6 %   Eosinophils Absolute 0.3 0.0 - 0.5 K/uL   Basophils Relative 2 %   Basophils Absolute 0.1 0.0 - 0.1 K/uL   WBC Morphology MORPHOLOGY UNREMARKABLE    Smear Review LARGE AND GIANT PLTS    Immature Granulocytes 0 %   Abs Immature Granulocytes 0.02 0.00 - 0.07 K/uL   Polychromasia PRESENT    Target Cells PRESENT   CMP (Cancer Center only)  Result Value Ref Range   Sodium 135 135 - 145 mmol/L   Potassium 4.3 3.5 - 5.1 mmol/L   Chloride 103 98 - 111 mmol/L   CO2 27 22 - 32 mmol/L   Glucose, Bld 90 70 - 99 mg/dL   BUN 8 6 - 20 mg/dL   Creatinine 7.82 9.56 - 1.00 mg/dL   Calcium 9.2 8.9 - 21.3 mg/dL   Total Protein 8.5 (H) 6.5 - 8.1 g/dL   Albumin 3.6 3.5 - 5.0 g/dL   AST 086 (HH) 15 - 41 U/L   ALT 89 (H) 0 - 44 U/L   Alkaline Phosphatase 113 38 - 126 U/L   Total Bilirubin 0.9 0.0 - 1.2 mg/dL   GFR, Estimated >57 >84 mL/min   Anion gap 5 5 - 15  Total Protein, Urine  dipstick  Result Value Ref Range   Protein, ur NEGATIVE NEGATIVE mg/dL  Pregnancy, urine  Result Value Ref Range   Preg Test, Ur NEGATIVE NEGATIVE     RADIOGRAPHIC  STUDIES:  I have personally reviewed the radiological images as listed and agree with the findings in the report.  DG Abd 1 View Result Date: 08/30/2023 CLINICAL DATA:  Abdominal pain EXAM: ABDOMEN - 1 VIEW COMPARISON:  None Available. FINDINGS: The bowel gas pattern is normal. No radio-opaque calculi or other significant radiographic abnormality are seen. IMPRESSION: Negative. Electronically Signed   By: Darliss Cheney M.D.   On: 08/30/2023 17:09   CT CHEST W CONTRAST Result Date: 08/23/2023 CLINICAL DATA:  Occult malignancy. Hepatocellular carcinoma, looking for metastatic lesion. * Tracking Code: BO * EXAM: CT CHEST WITH CONTRAST TECHNIQUE: Multidetector CT imaging of the chest was performed during intravenous contrast administration. RADIATION DOSE REDUCTION: This exam was performed according to the departmental dose-optimization program which includes automated exposure control, adjustment of the mA and/or kV according to patient size and/or use of iterative reconstruction technique. CONTRAST:  50mL OMNIPAQUE IOHEXOL 350 MG/ML SOLN COMPARISON:  None Available. FINDINGS: Cardiovascular: Normal cardiac size. No pericardial effusion. Small amount of fluid noted in the periaortic recesses. No aortic aneurysm. Mediastinum/Nodes: Visualized thyroid gland appears grossly unremarkable. No solid / cystic mediastinal masses. The esophagus is nondistended precluding optimal assessment. No axillary, mediastinal or hilar lymphadenopathy by size criteria. Lungs/Pleura: The central tracheo-bronchial tree is patent. There are patchy areas of linear, plate-like atelectasis and/or scarring throughout bilateral lungs with asymmetric more involvement of the middle lobe. No mass or consolidation. No pleural effusion or pneumothorax. No suspicious lung nodules. Upper Abdomen: Redemonstration of large heterogeneous liver masses, incompletely characterized on the current exam. Please refer to MRI abdomen from 08/21/2023 for  details. Remaining visualized upper abdominal viscera within normal limits. Musculoskeletal: The visualized soft tissues of the chest wall are grossly unremarkable. No suspicious osseous lesions. IMPRESSION: 1. No metastatic disease identified within the chest. No lung mass, consolidation, pleural effusion or pneumothorax. No suspicious lung nodule. 2. Multiple other nonacute observations, as described above. Electronically Signed   By: Jules Schick M.D.   On: 08/23/2023 09:54   MR LIVER W WO CONTRAST Result Date: 08/22/2023 CLINICAL DATA:  48 year old female with history of liver lesion concerning for potential hepatocellular carcinoma. Follow-up study. EXAM: MRI ABDOMEN WITHOUT AND WITH CONTRAST TECHNIQUE: Multiplanar multisequence MR imaging of the abdomen was performed both before and after the administration of intravenous contrast. CONTRAST:  10mL GADAVIST GADOBUTROL 1 MMOL/ML IV SOLN COMPARISON:  No prior abdominal MRI. CT of the abdomen and pelvis 08/21/2023. FINDINGS: Lower chest: Elevation of the right hemidiaphragm. Otherwise, unremarkable. Hepatobiliary: Numerous hepatic lesions are noted, with a dominant lesion which replaces much of the right lobe of the liver (axial image 51 of series 15 and coronal image 37 of series 17) estimated to measure approximately 16.5 x 12.7 x 17.7 cm. This lesion is heterogeneous in signal intensity on T1 and T2 weighted images, but clearly has internal areas of hypervascular enhancement on early phase post gadolinium imaging, with persistent low-level enhancement on more delayed imaging. The other dominant lesion is centered in the superior aspect of the liver, likely within the superior aspect of segment 4A (axial image 19 of series 15 and coronal image 53 of series 17) estimated to measure approximately 8.1 x 6.8 x 7.8 cm, with similar imaging characteristics to the previously described lesion, although with greater degree of internal  washout and probable pseudo  capsule on delayed imaging. Multiple other smaller hypervascular lesions are apparent on arterial phase imaging throughout all aspects of the liver, some of which demonstrate delayed washout. Some amorphous T1 hyperintense material and T2 hypointense material is noted within the lumen of the gallbladder, likely biliary sludge. Gallbladder is not distended. No pericholecystic fluid or surrounding inflammatory changes. Mild intrahepatic biliary ductal dilatation noted in the left lobe of the liver likely secondary to compression of the central bile ducts from the hepatic masses. Common bile duct is normal in caliber measuring 5 mm in the porta hepatis. Pancreas: No pancreatic mass. No pancreatic ductal dilatation. No pancreatic or peripancreatic fluid collections or inflammatory changes. Spleen:  Unremarkable. Adrenals/Urinary Tract: Small T1 hypointense, T2 hyperintense, nonenhancing lesions in both kidneys compatible with simple (Bosniak class 1) cysts, which require no imaging follow-up. No aggressive appearing renal lesions. No hydroureteronephrosis. Left adrenal gland is unremarkable in appearance. Right adrenal gland is not confidently identified, likely compressed secondary to the adjacent large hepatic mass. Stomach/Bowel: Visualized portions are unremarkable. Vascular/Lymphatic: No aneurysm identified in the visualized abdominal vasculature. No lymphadenopathy noted in the abdomen. Other:  No significant volume of ascites. Musculoskeletal: No aggressive appearing osseous lesions are noted in the visualized portions of the skeleton. IMPRESSION: 1. Multiple aggressive appearing hepatic lesions, as above, with imaging characteristics most suggestive of multifocal hepatocellular carcinoma. 2. Biliary sludge in the gallbladder. No findings to suggest an acute cholecystitis at this time. 3. Mild intrahepatic biliary ductal dilatation in the left lobe of the liver, likely secondary to compression of the central bile  ducts from the hepatic masses. Electronically Signed   By: Trudie Reed M.D.   On: 08/22/2023 09:10   CT ABDOMEN PELVIS W CONTRAST Result Date: 08/21/2023 CLINICAL DATA:  Right side abdominal pain EXAM: CT ABDOMEN AND PELVIS WITH CONTRAST TECHNIQUE: Multidetector CT imaging of the abdomen and pelvis was performed using the standard protocol following bolus administration of intravenous contrast. RADIATION DOSE REDUCTION: This exam was performed according to the departmental dose-optimization program which includes automated exposure control, adjustment of the mA and/or kV according to patient size and/or use of iterative reconstruction technique. CONTRAST:  75mL OMNIPAQUE IOHEXOL 350 MG/ML SOLN COMPARISON:  08/08/2017 FINDINGS: Lower chest: No acute abnormality Hepatobiliary: There is a large mass involving the right hepatic lobe which is new since prior study. This measures 15.5 x 12.5 cm. Areas of central low-density noted within the mass which could reflect scar. The mass is isodense to the remainder the liver on portal venous phase imaging. Possible large feeding vessel noted posteromedially. Gallbladder unremarkable. Pancreas: No focal abnormality or ductal dilatation. Spleen: No focal abnormality.  Normal size. Adrenals/Urinary Tract: No suspicious renal or adrenal lesion. No stones or hydronephrosis. Urinary bladder unremarkable. Stomach/Bowel: Normal appendix. Stomach, large and small bowel grossly unremarkable. Vascular/Lymphatic: No evidence of aneurysm or adenopathy. There is a spontaneous left splenorenal shunt with suggest portal venous hypertension. This may be due to mass effect and compression on the main portal vein from the large right hepatic mass. Reproductive: Uterus and adnexa unremarkable.  No mass. Other: No free fluid or free air. Musculoskeletal: No acute bony abnormality. IMPRESSION: 15.5 x 12.5 cm solid well-circumscribed mass within the right hepatic lobe. Areas of central  low-density suggest possibility of central scar. There may be a large feeding vessel. Favor focal nodular hyperplasia although fibrolamellar HCC can have a similar appearance. Recommend further evaluation MRI without and with contrast. 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 No evidence for cirrhosis. This may be related to mass effect and compression of the portal vein from the large mass. Electronically Signed   By: Charlett Nose M.D.   On: 08/21/2023 01:38   US Abdomen Limited RUQ (LIVER/GB) Result Date: 08/20/2023 CLINICAL DATA:  Right upper quadrant pain EXAM: ULTRASOUND ABDOMEN LIMITED RIGHT UPPER QUADRANT COMPARISON:  CT 08/08/2017 FINDINGS: Gallbladder: No gallstones or wall thickening visualized. No sonographic Murphy sign noted by sonographer. Common bile duct: Diameter: 2.2 mm Liver: Large heterogeneous solid mass measuring 14.8 x 16.4 x 16.9 cm. Portal vein is patent on color Doppler imaging with possible reversal of flow. Other: None. IMPRESSION: 1. Large heterogeneous solid mass within the liver measuring up to 16.9 cm, suspicious for malignancy. Recommend further evaluation with contrast-enhanced CT or MRI. 2. Possible reversal of flow in the portal vein. Electronically Signed   By: Jasmine Pang M.D.   On: 08/20/2023 19:04    CODE STATUS:  Code Status History     Date Active Date Inactive Code Status Order ID Comments User Context   08/29/2023 0409 08/31/2023 1925 Full Code [id number]  Darlin Drop, DO ED   08/21/2023 0400 08/23/2023 2127 Full Code [id number]  Tereasa Coop, MD ED    Questions for Most Recent Historical Code Status (Order [id number])     Question Answer   By: Consent: discussion documented in EHR            Orders Placed This Encounter  Procedures   CBC with Differential (Cancer Center Only)    Standing Status:   Future    Expected Date:   10/23/2023    Expiration Date:   10/22/2024   CMP (Cancer Center only)    Standing Status:    Future    Expected Date:   10/23/2023    Expiration Date:   10/22/2024   T4    Standing Status:   Future    Expected Date:   10/23/2023    Expiration Date:   10/22/2024   TSH    Standing Status:   Future    Expected Date:   10/23/2023    Expiration Date:   10/22/2024   Total Protein, Urine dipstick    Standing Status:   Future    Expected Date:   10/23/2023    Expiration Date:   10/22/2024   CBC with Differential (Cancer Center Only)    Standing Status:   Future    Expected Date:   11/13/2023    Expiration Date:   11/12/2024   CMP (Cancer Center only)    Standing Status:   Future    Expected Date:   11/13/2023    Expiration Date:   11/12/2024   CBC with Differential (Cancer Center Only)    Standing Status:   Future    Expected Date:   12/04/2023    Expiration Date:   12/03/2024   CMP (Cancer Center only)    Standing Status:   Future    Expected Date:   12/04/2023    Expiration Date:   12/03/2024   CBC with Differential (Cancer Center Only)    Standing Status:   Future    Expected Date:   12/25/2023    Expiration Date:   12/24/2024   CMP (Cancer Center only)    Standing Status:   Future    Expected Date:   12/25/2023    Expiration Date:   12/24/2024   T4    Standing Status:  Future    Expected Date:   12/25/2023    Expiration Date:   12/24/2024   TSH    Standing Status:   Future    Expected Date:   12/25/2023    Expiration Date:   12/24/2024   Total Protein, Urine dipstick    Standing Status:   Future    Expected Date:   12/25/2023    Expiration Date:   12/24/2024     Future Appointments  Date Time Provider Department Center  09/22/2023  9:45 AM Randall Hiss, MD [facility]  10/02/2023  8:00 AM [facility] LAB [facility] None  10/02/2023  8:30 AM Eufemia Prindle, Archie Patten, MD [facility] None  10/02/2023  9:15 AM [facility] INFUSION [facility] None     This document was completed utilizing speech recognition software. Grammatical errors, random word insertions, pronoun errors, and  incomplete sentences are an occasional consequence of this system due to software limitations, ambient noise, and hardware issues. Any formal questions or concerns about the content, text or information contained within the body of this dictation should be directly addressed to the provider for clarification.

## 2023-09-11 NOTE — Assessment & Plan Note (Signed)
 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 Awaiting authorization for tenofovir. - Follow up with infectious disease specialists for tenofovir authorization - Monitor liver function tests and ammonia levels

## 2023-09-11 NOTE — Assessment & Plan Note (Signed)
 Chronic pain managed with hydrocodone, transitioning to oxycodone to avoid excessive acetaminophen intake, which can affect liver function. - Prescribed oxycodone, start with 2-3 tablets per day, up to 4 tablets if needed - Sent prescription to [facility] at [facility] and [employer]  Patient has not filled oxycodone prescription yet.  She will fill it in the next couple of days.

## 2023-09-11 NOTE — Patient Instructions (Signed)
 Instrucciones al darle de alta: Discharge Instructions Gracias por elegir al [facility] de [place] para brindarle atencin mdica de oncologa y Teacher, English as a foreign language.   Si usted tiene una cita de laboratorio con [employer] de [place], por favor vaya directamente al [employer] de Cncer y regstrese en el rea de Engineer, maintenance (IT).   Use ropa cmoda y [place] para tener fcil acceso a las vas del Portacath (acceso venoso de Set designer duracin) o la lnea PICC (catter central colocado por va perifrica).   Nos esforzamos por ofrecerle tiempo de calidad con su proveedor. Es posible que tenga que volver a programar su cita si llega tarde (15 minutos o ms).  El llegar tarde le afecta a usted y a otros pacientes cuyas citas son posteriores a Armed forces operational officer.  Adems, si usted falta a tres o ms citas sin avisar a la oficina, puede ser retirado(a) de la clnica a discrecin del proveedor.      Para las solicitudes de renovacin de recetas, pida a su farmacia que se ponga en contacto con nuestra oficina y deje que transcurran 72 horas para que se complete el proceso de las renovaciones.    Hoy usted recibi los siguientes agentes de quimioterapia e/o inmunoterapia bevacizumab, tecentriq      Para ayudar a prevenir las nuseas y los vmitos despus de su tratamiento, le recomendamos que tome su medicamento para las nuseas segn las indicaciones.  LOS SNTOMAS QUE DEBEN COMUNICARSE INMEDIATAMENTE SE INDICAN A CONTINUACIN: *FIEBRE SUPERIOR A 100.4 F (38 C) O MS *ESCALOFROS O SUDORACIN *NUSEAS Y VMITOS QUE NO SE CONTROLAN CON EL MEDICAMENTO PARA LAS NUSEAS *DIFICULTAD INUSUAL PARA RESPIRAR  *MORETONES O HEMORRAGIAS NO HABITUALES *PROBLEMAS URINARIOS (dolor o ardor al Geographical information systems officer o frecuencia para Geographical information systems officer) *PROBLEMAS INTESTINALES (diarrea inusual, estreimiento, dolor cerca del ano) SENSIBILIDAD EN LA BOCA Y EN LA GARGANTA CON O SIN LA PRESENCIA DE LCERAS (dolor de garganta, llagas en la boca o dolor de  muelas/dientes) ERUPCIN, HINCHAZN O DOLORES INUSUALES FLUJO VAGINAL INUSUAL O PICAZN/RASQUIA    Los puntos marcados con un asterisco ( *) indican una posible emergencia y debe hacer un seguimiento tan pronto como le sea posible o vaya al Departamento de Emergencias si se le presenta algn problema.  Por favor, muestre la [place] DE ADVERTENCIA DE Marc Morgans DE ADVERTENCIA DE [place] Fanti al registrarse en [address] de Emergencias y a la enfermera de triaje.  Si tiene preguntas despus de su visita o necesita cancelar o volver a programar su cita, por favor pngase en contacto con [facility] - A DEPT OF [facility]  Dept: [phone] y siga las instrucciones. Las horas de oficina son de 8:00 a.m. a 4:30 p.m. de lunes a viernes. Por favor, tenga en cuenta que los mensajes de voz que se dejan despus de las 4:00 p.m. posiblemente no se devolvern hasta el siguiente da de [place].  Cerramos los fines de semana y [employer]. En todo momento tiene acceso a una enfermera para preguntas urgentes. Por favor, llame al nmero principal de la clnica Dept: [phone] y siga las instrucciones.   Para cualquier pregunta que no sea de carcter urgente, tambin puede ponerse en contacto con su proveedor [employer]. Ahora ofrecemos visitas electrnicas para cualquier persona mayor de 18 aos que solicite atencin mdica en lnea para los sntomas que no sean urgentes. Para ms detalles vaya a [email or web address].   Tambin puede bajar la aplicacin de MyChart! Vaya a  la tienda de aplicaciones, busque "MyChart", abra la aplicacin, seleccione Asher, e ingrese con su nombre de usuario y la contrasea de [name].

## 2023-09-12 LAB — T4: T4, Total: 10.6 ug/dL (ref 4.5–12.0)

## 2023-09-13 ENCOUNTER — Other Ambulatory Visit: Payer: Self-pay | Admitting: Oncology

## 2023-09-13 DIAGNOSIS — C220 Liver cell carcinoma: Secondary | ICD-10-CM

## 2023-09-14 ENCOUNTER — Telehealth: Payer: Self-pay

## 2023-09-14 NOTE — Telephone Encounter (Signed)
 Ms Christina Gutierrez states that she is doing fine today. She had some vomiting over the weekend. She took her nausea medication with good effect. She is eating, drinking, and urinating well. She knows to call the office at [phone] if  she has any questions or concerns.

## 2023-09-14 NOTE — Telephone Encounter (Signed)
-----   Message from Nurse Guilford Shi sent at 09/11/2023  2:24 PM EST ----- Regarding: FT Chemo/ Pasam First time immunotherapy - bevacizumab, tecentriq. Able to complete treatment.

## 2023-09-15 ENCOUNTER — Other Ambulatory Visit: Payer: Self-pay

## 2023-09-17 ENCOUNTER — Other Ambulatory Visit: Payer: Self-pay

## 2023-09-18 ENCOUNTER — Other Ambulatory Visit: Payer: Self-pay

## 2023-09-18 NOTE — Progress Notes (Signed)
 Specialty Pharmacy Refill Coordination Note  Christina Gutierrez is a 48 y.o. female contacted today regarding refills of specialty medication(s) Tenofovir Alafenamide Fumarate (VEMLIDY)   Patient requested Pickup at [facility] at [facility] date: 09/21/23   Medication will be filled on 09/21/23.

## 2023-09-21 ENCOUNTER — Other Ambulatory Visit (HOSPITAL_COMMUNITY): Payer: Self-pay

## 2023-09-21 ENCOUNTER — Other Ambulatory Visit: Payer: Self-pay

## 2023-09-21 ENCOUNTER — Other Ambulatory Visit: Payer: Self-pay | Admitting: Physician Assistant

## 2023-09-21 DIAGNOSIS — Z01818 Encounter for other preprocedural examination: Secondary | ICD-10-CM

## 2023-09-21 NOTE — H&P (Signed)
 Chief Complaint: Hepatocellular carcinoma; referred for port a cath placement to assist with treatment  Referring Provider(s): Pasam,A  Supervising Physician: Marliss Coots  Patient Status: [facility] - Out-pt  History of Present Illness: Christina Gutierrez is a 48 y.o. female with no significant past medical history who was hospitalized on 08/21/2023 after she presented with worsening right-sided abdominal pain, nausea, vomiting, decreased oral intake.  Workup including MRI of the abdomen showed evidence of multifocal hepatocellular carcinoma.  She was diagnosed with hepatitis B infection during prior admission. She is scheduled today for port a cath placement to assist with treatment.   Pt is spanish speaking, an interpreter was present during this visit.     Patient is Full Code  Past Medical History:  Diagnosis Date   Metabolic acidosis 08/21/2023    Past Surgical History:  Procedure Laterality Date   CESAREAN SECTION      Allergies: Patient has no known allergies.  Medications: Prior to Admission medications   Medication Sig Start Date End Date Taking? Authorizing Provider  ondansetron (ZOFRAN) 4 MG tablet Take 1 tablet (4 mg total) by mouth daily as needed for nausea or vomiting. 08/31/23 08/30/24  Alwyn Ren, MD  Oxycodone HCl 10 MG TABS Take 1 tablet (10 mg total) by mouth every 6 (six) hours as needed. Patient not taking: Reported on 09/11/2023 08/31/23   Alwyn Ren, MD  pantoprazole (PROTONIX) 40 MG tablet Take 1 tablet (40 mg total) by mouth daily at 6 (six) AM. 08/31/23 09/30/23  Alwyn Ren, MD  prochlorperazine (COMPAZINE) 10 MG tablet TAKE 1 TABLET(10 MG) BY MOUTH EVERY 6 HOURS AS NEEDED FOR NAUSEA OR VOMITING 09/14/23   Pasam, Archie Patten, MD  tenofovir alafenamide (VEMLIDY) 25 MG tablet Take 1 tablet (25 mg total) by mouth daily. 08/28/23   Veryl Speak, FNP     History reviewed. No pertinent family history.  Social History    Socioeconomic History   Marital status: Married    Spouse name: Not on file   Number of children: Not on file   Years of education: Not on file   Highest education level: Not on file  Occupational History   Not on file  Tobacco Use   Smoking status: Never   Smokeless tobacco: Never  Vaping Use   Vaping status: Never Used  Substance and Sexual Activity   Alcohol use: No   Drug use: No   Sexual activity: Not on file  Other Topics Concern   Not on file  Social History Narrative   Not on file   Social Drivers of Health   Financial Resource Strain: Not on file  Food Insecurity: No Food Insecurity (08/29/2023)   Hunger Vital Sign    Worried About Running Out of Food in the Last Year: Never true    Ran Out of Food in the Last Year: Never true  Transportation Needs: No Transportation Needs (08/29/2023)   PRAPARE - Administrator, Civil Service (Medical): No    Lack of Transportation (Non-Medical): No  Physical Activity: Not on file  Stress: Not on file  Social Connections: Patient Declined (08/29/2023)   Social Connection and Isolation Panel [NHANES]    Frequency of Communication with Friends and Family: Patient declined    Frequency of Social Gatherings with Friends and Family: Patient declined    Attends Religious Services: Patient declined    Database administrator or Organizations: Patient declined    Attends Banker  Meetings: Patient declined    Marital Status: Patient declined       Review of Systems  Constitutional:  Negative for chills, fatigue and fever.  Respiratory:  Negative for cough, shortness of breath and wheezing.   Gastrointestinal:  Negative for diarrhea, nausea and vomiting.  Neurological:  Negative for dizziness and headaches.  Psychiatric/Behavioral:  Negative for agitation, behavioral problems and confusion.     Vital Signs:   Advance Care Plan:    Physical Exam Constitutional:      Appearance: She is  well-developed.  HENT:     Head: Atraumatic.     Mouth/Throat:     Mouth: Mucous membranes are moist.  Cardiovascular:     Rate and Rhythm: Normal rate and regular rhythm.     Heart sounds: No murmur heard. Pulmonary:     Effort: Pulmonary effort is normal.     Breath sounds: Normal breath sounds.  Abdominal:     General: Bowel sounds are normal.     Palpations: Abdomen is soft.  Musculoskeletal:        General: Normal range of motion.  Skin:    General: Skin is warm.  Neurological:     Mental Status: She is alert and oriented to person, place, and time.  Psychiatric:        Mood and Affect: Mood normal.        Behavior: Behavior normal.     Imaging: DG Abd 1 View Result Date: 08/30/2023 CLINICAL DATA:  Abdominal pain EXAM: ABDOMEN - 1 VIEW COMPARISON:  None Available. FINDINGS: The bowel gas pattern is normal. No radio-opaque calculi or other significant radiographic abnormality are seen. IMPRESSION: Negative. Electronically Signed   By: Darliss Cheney M.D.   On: 08/30/2023 17:09    Labs:  CBC: Recent Labs    08/29/23 0504 08/31/23 0536 08/31/23 0731 09/11/23 0939  WBC 5.4 5.5 5.5 5.4  HGB 7.6* 8.4* 8.4* 8.8*  HCT 24.2* 26.5* 26.2* 26.9*  PLT 334 371 365 303    COAGS: Recent Labs    08/21/23 0106 08/21/23 0614 08/29/23 0504  INR 1.4* 1.5* 1.4*    BMP: Recent Labs    08/29/23 0504 08/31/23 0536 08/31/23 0731 09/11/23 0939  NA 136 134* 135 135  K 3.9 3.4* 3.7 4.3  CL 106 102 101 103  CO2 22 24 24 27   GLUCOSE 76 84 91 90  BUN 8 5* <5* 8  CALCIUM 8.6* 8.9 9.1 9.2  CREATININE 0.60 0.63 0.58 0.61  GFRNONAA >60 >60 >60 >60    LIVER FUNCTION TESTS: Recent Labs    08/29/23 0504 08/31/23 0536 08/31/23 0731 09/11/23 0939  BILITOT 1.2 1.4* 1.6* 0.9  AST 201* 224* 233* 214*  ALT 70* 79* 82* 89*  ALKPHOS 85 83 91 113  PROT 7.7 8.0 8.5* 8.5*  ALBUMIN 2.6* 2.7* 2.8* 3.6    TUMOR MARKERS: No results for input(s): "AFPTM", "CEA", "CA199",  "CHROMGRNA" in the last 8760 hours.  Assessment and Plan: 48 y.o. female with no significant past medical history who was hospitalized on 08/21/2023 after she presented with worsening right-sided abdominal pain, nausea, vomiting, decreased oral intake.  Workup including MRI of the abdomen showed evidence of multifocal hepatocellular carcinoma.  She was diagnosed with hepatitis B infection during prior admission. She is scheduled today for port a cath placement to assist with treatment.Risks and benefits of image guided port-a-catheter placement was discussed with the patient including, but not limited to bleeding, infection, pneumothorax, or fibrin sheath  development and need for additional procedures.  All of the patient's questions were answered, patient is agreeable to proceed. Consent signed and in chart.  Pt is spanish speaking, an interpreter was present during this visit.    Thank you for allowing our service to participate in Christina Gutierrez 's care.  Electronically Signed: Loman Brooklyn, PA-C   09/22/2023, 12:47 PM      I spent a total of  25 minutes   in face to face in clinical consultation, greater than 50% of which was counseling/coordinating care for port a cath placement

## 2023-09-22 ENCOUNTER — Ambulatory Visit (HOSPITAL_COMMUNITY)
Admission: RE | Admit: 2023-09-22 | Discharge: 2023-09-22 | Disposition: A | Discharge status: Home / Self Care | Payer: Self-pay | Source: Ambulatory Visit | Attending: Oncology | Admitting: Oncology

## 2023-09-22 ENCOUNTER — Other Ambulatory Visit: Payer: Self-pay

## 2023-09-22 ENCOUNTER — Encounter (HOSPITAL_COMMUNITY): Payer: Self-pay

## 2023-09-22 ENCOUNTER — Ambulatory Visit (HOSPITAL_COMMUNITY)
Admission: RE | Admit: 2023-09-22 | Discharge: 2023-09-22 | Disposition: A | Discharge status: Home / Self Care | Payer: Self-pay | Source: Ambulatory Visit | Attending: Oncology

## 2023-09-22 ENCOUNTER — Other Ambulatory Visit (HOSPITAL_COMMUNITY): Payer: Self-pay

## 2023-09-22 ENCOUNTER — Ambulatory Visit: Payer: Self-pay | Admitting: Infectious Disease

## 2023-09-22 ENCOUNTER — Encounter: Payer: Self-pay | Admitting: Infectious Disease

## 2023-09-22 VITALS — BP 124/83 | HR 84 | Temp 97.6°F | Wt 116.0 lb

## 2023-09-22 DIAGNOSIS — B181 Chronic viral hepatitis B without delta-agent: Secondary | ICD-10-CM

## 2023-09-22 DIAGNOSIS — C220 Liver cell carcinoma: Secondary | ICD-10-CM

## 2023-09-22 DIAGNOSIS — Z7185 Encounter for immunization safety counseling: Secondary | ICD-10-CM

## 2023-09-22 DIAGNOSIS — Z01818 Encounter for other preprocedural examination: Secondary | ICD-10-CM

## 2023-09-22 HISTORY — PX: IR IMAGING GUIDED PORT INSERTION: IMG5740

## 2023-09-22 MED ORDER — FENTANYL CITRATE (PF) 100 MCG/2ML IJ SOLN
INTRAMUSCULAR | Status: AC | PRN
  Administered 2023-09-22 (×2): 50 ug via INTRAVENOUS

## 2023-09-22 MED ORDER — LIDOCAINE-EPINEPHRINE 1 %-1:100000 IJ SOLN
20.0000 mL | Freq: Once | INTRAMUSCULAR | Status: AC
  Administered 2023-09-22: 18 mL via INTRADERMAL

## 2023-09-22 MED ORDER — MIDAZOLAM HCL 2 MG/2ML IJ SOLN
INTRAMUSCULAR | Status: AC | PRN
Start: 2023-09-22 — End: 2023-09-22
  Administered 2023-09-22 (×2): 1 mg via INTRAVENOUS

## 2023-09-22 MED ORDER — MIDAZOLAM HCL 2 MG/2ML IJ SOLN
INTRAMUSCULAR | Status: AC
Start: 2023-09-22 — End: ?
  Filled 2023-09-22: qty 4

## 2023-09-22 MED ORDER — HEPARIN SOD (PORK) LOCK FLUSH 100 UNIT/ML IV SOLN
500.0000 [IU] | Freq: Once | INTRAVENOUS | Status: AC
  Administered 2023-09-22: 500 [IU] via INTRAVENOUS

## 2023-09-22 MED ORDER — LIDOCAINE-EPINEPHRINE 1 %-1:100000 IJ SOLN
INTRAMUSCULAR | Status: AC
  Filled 2023-09-22: qty 1

## 2023-09-22 MED ORDER — ONDANSETRON HCL 4 MG/2ML IJ SOLN
INTRAMUSCULAR | Status: AC
  Filled 2023-09-22: qty 2

## 2023-09-22 MED ORDER — TENOFOVIR ALAFENAMIDE FUMARATE 25 MG PO TABS
25.0000 mg | ORAL_TABLET | Freq: Every day | ORAL | 11 refills | Status: DC
  Filled 2023-09-23: qty 30, 30d supply, fill #0
  Filled 2023-10-19: qty 30, 30d supply, fill #1
  Filled 2023-11-19: qty 30, 30d supply, fill #2
  Filled 2023-12-16 – 2023-12-25 (×2): qty 30, 30d supply, fill #3
  Filled 2024-01-18: qty 30, 30d supply, fill #4
  Filled 2024-02-16: qty 30, 30d supply, fill #5
  Filled 2024-03-17 – 2024-03-24 (×2): qty 30, 30d supply, fill #6
  Filled 2024-04-19 (×2): qty 30, 30d supply, fill #7
  Filled 2024-05-12: qty 30, 30d supply, fill #8

## 2023-09-22 MED ORDER — HEPARIN SOD (PORK) LOCK FLUSH 100 UNIT/ML IV SOLN
INTRAVENOUS | Status: AC
  Filled 2023-09-22: qty 5

## 2023-09-22 MED ORDER — FENTANYL CITRATE (PF) 100 MCG/2ML IJ SOLN
INTRAMUSCULAR | Status: AC
  Filled 2023-09-22: qty 4

## 2023-09-22 MED ORDER — SODIUM CHLORIDE 0.9% FLUSH: 3.0000 mL | INTRAVENOUS | Status: DC | PRN

## 2023-09-22 MED ORDER — SODIUM CHLORIDE 0.9% FLUSH
3.0000 mL | Freq: Two times a day (BID) | INTRAVENOUS | Status: DC
Start: 2023-09-22 — End: 2023-09-23

## 2023-09-22 NOTE — Procedures (Signed)
 Interventional Radiology Procedure Note  Procedure: Single Lumen Power Port Placement    Access:  Right internal jugular vein  Findings: Catheter tip positioned at cavoatrial junction. Port is ready for immediate use.   Complications: None  EBL: < 10 mL  Recommendations:  - Ok to shower in 24 hours - Do not submerge for 7 days - Routine line care    Marliss Coots, MD

## 2023-09-22 NOTE — Discharge Instructions (Signed)

## 2023-09-22 NOTE — Progress Notes (Signed)
 Subjective:   Chief Complaint  Patient presents with   Follow-up    Vomiting 3/19/ waiting on catheter placement        Patient ID: Christina Gutierrez, female    DOB: 19-Feb-1976, 48 y.o.   MRN: [id number]  HPI   NOTE VISIT CONDUCTED WITH IN PERSON SPANISH TRANSLATOR:  Discussed the use of AI scribe software for clinical note transcription with the patient, who gave verbal consent to proceed.  History of Present Illness   The patient, with a history of recently diagnosed Hepatitis B and hepatocellular carcinomna, presents for a follow-up visit. She has been started on Vemlidy for Hepatitis B and is currently receiving infusions of antibodies for the liver cancer. She reports feeling well overall, but has been experiencing nausea, She also reports a lack of strength from the waist down. The patient  does not have insurance, but is receiving medication from a pharmaceutical company for free.       Past Medical History:  Diagnosis Date   Metabolic acidosis 08/21/2023    Past Surgical History:  Procedure Laterality Date   CESAREAN SECTION      No family history on file.    Social History   Socioeconomic History   Marital status: Married    Spouse name: Not on file   Number of children: Not on file   Years of education: Not on file   Highest education level: Not on file  Occupational History   Not on file  Tobacco Use   Smoking status: Never   Smokeless tobacco: Never  Vaping Use   Vaping status: Never Used  Substance and Sexual Activity   Alcohol use: No   Drug use: No   Sexual activity: Not on file  Other Topics Concern   Not on file  Social History Narrative   Not on file   Social Drivers of Health   Financial Resource Strain: Not on file  Food Insecurity: No Food Insecurity (08/29/2023)   Hunger Vital Sign    Worried About Running Out of Food in the Last Year: Never true    Ran Out of Food in the Last Year: Never true  Transportation Needs: No  Transportation Needs (08/29/2023)   PRAPARE - Administrator, Civil Service (Medical): No    Lack of Transportation (Non-Medical): No  Physical Activity: Not on file  Stress: Not on file  Social Connections: Patient Declined (08/29/2023)   Social Connection and Isolation Panel [NHANES]    Frequency of Communication with Friends and Family: Patient declined    Frequency of Social Gatherings with Friends and Family: Patient declined    Attends Religious Services: Patient declined    Database administrator or Organizations: Patient declined    Attends Engineer, structural: Patient declined    Marital Status: Patient declined    No Known Allergies   Current Outpatient Medications:    ondansetron (ZOFRAN) 4 MG tablet, Take 1 tablet (4 mg total) by mouth daily as needed for nausea or vomiting., Disp: 30 tablet, Rfl: 1   Oxycodone HCl 10 MG TABS, Take 1 tablet (10 mg total) by mouth every 6 (six) hours as needed. (Patient not taking: Reported on 09/11/2023), Disp: 90 tablet, Rfl: 0   pantoprazole (PROTONIX) 40 MG tablet, Take 1 tablet (40 mg total) by mouth daily at 6 (six) AM., Disp: 30 tablet, Rfl: 0   prochlorperazine (COMPAZINE) 10 MG tablet, TAKE 1 TABLET(10 MG) BY MOUTH EVERY 6 HOURS  AS NEEDED FOR NAUSEA OR VOMITING, Disp: 30 tablet, Rfl: 1   tenofovir alafenamide (VEMLIDY) 25 MG tablet, Take 1 tablet (25 mg total) by mouth daily., Disp: 30 tablet, Rfl: 4    Review of Systems  Constitutional:  Negative for activity change, appetite change, chills, diaphoresis, fatigue, fever and unexpected weight change.  HENT:  Negative for congestion, rhinorrhea, sinus pressure, sneezing, sore throat and trouble swallowing.   Eyes:  Negative for photophobia and visual disturbance.  Respiratory:  Negative for cough, chest tightness, shortness of breath, wheezing and stridor.   Cardiovascular:  Negative for chest pain, palpitations and leg swelling.  Gastrointestinal:  Positive for  nausea and vomiting. Negative for abdominal distention, abdominal pain, anal bleeding, blood in stool, constipation and diarrhea.  Genitourinary:  Negative for difficulty urinating, dysuria, flank pain and hematuria.  Musculoskeletal:  Negative for arthralgias, back pain, gait problem, joint swelling and myalgias.  Skin:  Negative for color change, pallor, rash and wound.  Neurological:  Positive for weakness. Negative for dizziness, tremors and light-headedness.  Hematological:  Negative for adenopathy. Does not bruise/bleed easily.  Psychiatric/Behavioral:  Negative for agitation, behavioral problems, confusion, decreased concentration, dysphoric mood and sleep disturbance.        Objective:   Physical Exam Constitutional:      General: She is not in acute distress.    Appearance: Normal appearance. She is well-developed. She is not ill-appearing or diaphoretic.  HENT:     Head: Normocephalic and atraumatic.     Right Ear: Hearing and external ear normal.     Left Ear: Hearing and external ear normal.     Nose: No nasal deformity or rhinorrhea.  Eyes:     General: No scleral icterus.    Conjunctiva/sclera: Conjunctivae normal.     Right eye: Right conjunctiva is not injected.     Left eye: Left conjunctiva is not injected.     Pupils: Pupils are equal, round, and reactive to light.  Neck:     Vascular: No JVD.  Cardiovascular:     Rate and Rhythm: Normal rate and regular rhythm.     Heart sounds: S1 normal and S2 normal.  Pulmonary:     Effort: Pulmonary effort is normal. No respiratory distress.     Breath sounds: No wheezing.  Abdominal:     General: There is no distension.  Musculoskeletal:        General: Normal range of motion.     Right shoulder: Normal.     Left shoulder: Normal.     Cervical back: Normal range of motion and neck supple.     Right hip: Normal.     Left hip: Normal.     Right knee: Normal.     Left knee: Normal.  Lymphadenopathy:     Head:      Right side of head: No submandibular, preauricular or posterior auricular adenopathy.     Left side of head: No submandibular, preauricular or posterior auricular adenopathy.     Cervical: No cervical adenopathy.     Right cervical: No superficial or deep cervical adenopathy.    Left cervical: No superficial or deep cervical adenopathy.  Skin:    General: Skin is warm and dry.     Coloration: Skin is not pale.     Findings: No abrasion, bruising, ecchymosis, erythema, lesion or rash.     Nails: There is no clubbing.  Neurological:     General: No focal deficit present.  Mental Status: She is alert and oriented to person, place, and time.     Sensory: No sensory deficit.     Coordination: Coordination normal.     Gait: Gait normal.  Psychiatric:        Attention and Perception: She is attentive.        Mood and Affect: Mood normal.        Speech: Speech normal.        Behavior: Behavior normal. Behavior is cooperative.        Thought Content: Thought content normal.        Judgment: Judgment normal.           Assessment & Plan:   Assessment and Plan    Hepatitis B Chronic hepatitis B managed with Vemlidy. Requires regular monitoring for treatment efficacy. - Order hepatitis B labs to monitor treatment response. - Schedule follow-up in six months for hepatitis B management.  Liver cancer Under oncologist care, receiving antibody infusions.  - Coordinate with oncology team for ongoing cancer treatment. - Provide supportive care for nausea as needed.  Vaccination status Received COVID-19 vaccination last year. Recommended updated COVID-19 vaccine from September 2024. Unable to vaccinate today due to procedure. - Advise to receive updated COVID-19 vaccine, flu vaccine at cancer center or during nursing visit.

## 2023-09-23 ENCOUNTER — Other Ambulatory Visit (HOSPITAL_COMMUNITY): Payer: Self-pay

## 2023-09-23 ENCOUNTER — Other Ambulatory Visit: Payer: Self-pay

## 2023-09-25 LAB — HEPATITIS B DNA, ULTRAQUANTITATIVE, PCR
Hepatitis B DNA: 678 [IU]/mL — ABNORMAL HIGH
Hepatitis B virus DNA: 2.83 {Log_IU}/mL — ABNORMAL HIGH

## 2023-09-27 ENCOUNTER — Encounter (HOSPITAL_COMMUNITY): Payer: Self-pay | Admitting: Emergency Medicine

## 2023-09-27 ENCOUNTER — Emergency Department (HOSPITAL_COMMUNITY)
Admission: EM | Admit: 2023-09-27 | Discharge: 2023-09-27 | Disposition: A | Discharge status: Home / Self Care | Payer: Self-pay | Attending: Emergency Medicine | Admitting: Emergency Medicine

## 2023-09-27 ENCOUNTER — Emergency Department (HOSPITAL_COMMUNITY): Payer: Self-pay

## 2023-09-27 ENCOUNTER — Other Ambulatory Visit: Payer: Self-pay

## 2023-09-27 DIAGNOSIS — R519 Headache, unspecified: Secondary | ICD-10-CM | POA: Insufficient documentation

## 2023-09-27 DIAGNOSIS — C169 Malignant neoplasm of stomach, unspecified: Secondary | ICD-10-CM | POA: Insufficient documentation

## 2023-09-27 DIAGNOSIS — R112 Nausea with vomiting, unspecified: Secondary | ICD-10-CM | POA: Insufficient documentation

## 2023-09-27 DIAGNOSIS — C229 Malignant neoplasm of liver, not specified as primary or secondary: Secondary | ICD-10-CM | POA: Insufficient documentation

## 2023-09-27 LAB — COMPREHENSIVE METABOLIC PANEL
ALT: 103 U/L — ABNORMAL HIGH (ref 0–44)
AST: 199 U/L — ABNORMAL HIGH (ref 15–41)
Albumin: 3.2 g/dL — ABNORMAL LOW (ref 3.5–5.0)
Alkaline Phosphatase: 101 U/L (ref 38–126)
Anion gap: 11 (ref 5–15)
BUN: 13 mg/dL (ref 6–20)
CO2: 23 mmol/L (ref 22–32)
Calcium: 9.2 mg/dL (ref 8.9–10.3)
Chloride: 100 mmol/L (ref 98–111)
Creatinine, Ser: 0.59 mg/dL (ref 0.44–1.00)
GFR, Estimated: >60 mL/min (ref >60)
Glucose, Bld: 112 mg/dL — ABNORMAL HIGH (ref 70–99)
Potassium: 3.8 mmol/L (ref 3.5–5.1)
Sodium: 134 mmol/L — ABNORMAL LOW (ref 135–145)
Total Bilirubin: 0.8 mg/dL (ref 0.0–1.2)
Total Protein: 9 g/dL — ABNORMAL HIGH (ref 6.5–8.1)

## 2023-09-27 LAB — RESP PANEL BY RT-PCR (RSV, FLU A&B, COVID)  RVPGX2
Influenza A by PCR: NEGATIVE
Influenza B by PCR: NEGATIVE
Resp Syncytial Virus by PCR: NEGATIVE
SARS Coronavirus 2 by RT PCR: NEGATIVE

## 2023-09-27 LAB — CBC WITH DIFFERENTIAL/PLATELET
Abs Immature Granulocytes: 0.01 10*3/uL (ref 0.00–0.07)
Basophils Absolute: 0.1 10*3/uL (ref 0.0–0.1)
Basophils Relative: 2 %
Eosinophils Absolute: 0.3 10*3/uL (ref 0.0–0.5)
Eosinophils Relative: 5 %
HCT: 28.7 % — ABNORMAL LOW (ref 36.0–46.0)
Hemoglobin: 9.3 g/dL — ABNORMAL LOW (ref 12.0–15.0)
Immature Granulocytes: 0 %
Lymphocytes Relative: 36 %
Lymphs Abs: 2.6 10*3/uL (ref 0.7–4.0)
MCH: 29.1 pg (ref 26.0–34.0)
MCHC: 32.4 g/dL (ref 30.0–36.0)
MCV: 89.7 fL (ref 80.0–100.0)
Monocytes Absolute: 0.6 10*3/uL (ref 0.1–1.0)
Monocytes Relative: 8 %
Neutro Abs: 3.5 10*3/uL (ref 1.7–7.7)
Neutrophils Relative %: 49 %
Platelets: 336 10*3/uL (ref 150–400)
RBC: 3.2 MIL/uL — ABNORMAL LOW (ref 3.87–5.11)
RDW: 17.2 % — ABNORMAL HIGH (ref 11.5–15.5)
WBC: 7.2 10*3/uL (ref 4.0–10.5)
nRBC: 0 % (ref 0.0–0.2)

## 2023-09-27 LAB — HCG, SERUM, QUALITATIVE: Preg, Serum: NEGATIVE

## 2023-09-27 MED ORDER — DROPERIDOL 2.5 MG/ML IJ SOLN
1.2500 mg | Freq: Once | INTRAMUSCULAR | Status: AC
  Administered 2023-09-27: 1.25 mg via INTRAVENOUS
  Filled 2023-09-27: qty 2

## 2023-09-27 MED ORDER — SODIUM CHLORIDE 0.9 % IV BOLUS
500.0000 mL | Freq: Once | INTRAVENOUS | Status: AC
Start: 2023-09-27 — End: 2023-09-27
  Administered 2023-09-27: 500 mL via INTRAVENOUS

## 2023-09-27 MED ORDER — MAGNESIUM SULFATE 2 GM/50ML IV SOLN
2.0000 g | Freq: Once | INTRAVENOUS | Status: AC
  Administered 2023-09-27: 2 g via INTRAVENOUS
  Filled 2023-09-27: qty 50

## 2023-09-27 MED ORDER — KETOROLAC TROMETHAMINE 30 MG/ML IJ SOLN
10.0000 mg | Freq: Once | INTRAMUSCULAR | Status: AC
  Administered 2023-09-27: 9.9 mg via INTRAVENOUS
  Filled 2023-09-27: qty 1

## 2023-09-27 NOTE — ED Triage Notes (Signed)
 Using spanish interpreter: Pt reports headache since yesterday. Meds not helping. Denies vision issues or balance issues. Hx liver & stomach cancer. Currently getting treatment.

## 2023-09-27 NOTE — ED Notes (Signed)
 Patient transported to CT

## 2023-09-27 NOTE — ED Provider Notes (Signed)
 [place] [facility]ERGENCY DEPARTMENT AT [facility] Provider Note   CSN: [id number] Arrival date & time: 09/27/23  0006     History  Chief Complaint  Patient presents with   Headache    Christina Gutierrez is a 48 y.o. female.  The history is provided by the patient. The history is limited by a language barrier. Language interpreter used: Lucienne Capers.  Headache Pain location:  Frontal Quality:  Dull Severity currently:  10/10 Severity at highest:  10/10 Onset quality:  Gradual Duration:  3 days Timing:  Constant Progression:  Worsening Chronicity:  Recurrent Similar to prior headaches: yes   Context: not activity   Context comment:  She gets them every few days on immunotherapy and has home narcotics but those make her nauseated Relieved by:  Nothing Worsened by:  Nothing Ineffective treatments:  None tried Associated symptoms: nausea and vomiting   Associated symptoms: no congestion, no cough, no diarrhea, no dizziness, no fever, no near-syncope, no neck pain, no neck stiffness, no numbness, no sore throat, no swollen glands, no URI and no weakness   Risk factors: no anger   Patient is on immunotherapy for cancer and gets headaches on her regimen which her oncologist is aware of as they happen every several days.  She has home narcotics but these have made her nauseated.  No fevers, no chills, no neck pain or stiffness.  No URI symptoms.  No cough.  No changes in vision or speech or weakness or changes in cognition.       Home Medications Prior to Admission medications   Medication Sig Start Date End Date Taking? Authorizing Provider  ondansetron (ZOFRAN) 4 MG tablet Take 1 tablet (4 mg total) by mouth daily as needed for nausea or vomiting. Patient not taking: Reported on 09/22/2023 08/31/23 08/30/24  Alwyn Ren, MD  Oxycodone HCl 10 MG TABS Take 1 tablet (10 mg total) by mouth every 6 (six) hours as needed. 08/31/23   Alwyn Ren, MD   pantoprazole (PROTONIX) 40 MG tablet Take 1 tablet (40 mg total) by mouth daily at 6 (six) AM. 08/31/23 09/30/23  Alwyn Ren, MD  prochlorperazine (COMPAZINE) 10 MG tablet TAKE 1 TABLET(10 MG) BY MOUTH EVERY 6 HOURS AS NEEDED FOR NAUSEA OR VOMITING Patient not taking: Reported on 09/22/2023 09/14/23   Pasam, Archie Patten, MD  tenofovir alafenamide (VEMLIDY) 25 MG tablet Take 1 tablet (25 mg total) by mouth daily. 09/22/23   Randall Hiss, MD      Allergies    Patient has no known allergies.    Review of Systems   Review of Systems  Constitutional:  Negative for fever.  HENT:  Negative for congestion and sore throat.   Respiratory:  Negative for cough.   Cardiovascular:  Negative for leg swelling and near-syncope.  Gastrointestinal:  Positive for nausea and vomiting. Negative for diarrhea.  Musculoskeletal:  Negative for neck pain and neck stiffness.  Neurological:  Positive for headaches. Negative for dizziness, facial asymmetry, speech difficulty, weakness and numbness.  All other systems reviewed and are negative.   Physical Exam Updated Vital Signs BP 106/68 (BP Location: Right Arm)   Pulse 86   Temp 98.3 F (36.8 C) (Oral)   Resp 12   LMP 09/16/2023   SpO2 97%  Physical Exam Vitals and nursing note reviewed.  Constitutional:      Appearance: Normal appearance.  HENT:     Head: Normocephalic and atraumatic.  Nose: Nose normal.     Mouth/Throat:     Mouth: Mucous membranes are moist.     Pharynx: Oropharynx is clear.  Eyes:     Extraocular Movements: Extraocular movements intact.     Conjunctiva/sclera: Conjunctivae normal.     Pupils: Pupils are equal, round, and reactive to light.     Comments: No proptosis intact cognition, disk margins sharp   Cardiovascular:     Rate and Rhythm: Normal rate and regular rhythm.     Pulses: Normal pulses.     Heart sounds: Normal heart sounds.  Pulmonary:     Effort: Pulmonary effort is normal.     Breath sounds:  Normal breath sounds.  Abdominal:     General: Bowel sounds are normal.     Palpations: Abdomen is soft.     Tenderness: There is no abdominal tenderness. There is no guarding.  Musculoskeletal:        General: Normal range of motion.     Cervical back: Normal range of motion and neck supple. No rigidity or tenderness.  Lymphadenopathy:     Cervical: No cervical adenopathy.  Skin:    General: Skin is warm and dry.     Capillary Refill: Capillary refill takes less than 2 seconds.  Neurological:     General: No focal deficit present.     Mental Status: She is alert and oriented to person, place, and time.     Cranial Nerves: No cranial nerve deficit.     Motor: No weakness.     Deep Tendon Reflexes: Reflexes normal.  Psychiatric:        Mood and Affect: Mood normal.        Behavior: Behavior normal.     ED Results / Procedures / Treatments   Labs (all labs ordered are listed, but only abnormal results are displayed) Results for orders placed or performed during the hospital encounter of 09/27/23  Resp panel by RT-PCR (RSV, Flu A&B, Covid) Anterior Nasal Swab   Collection Time: 09/27/23 12:21 AM   Specimen: Anterior Nasal Swab  Result Value Ref Range   SARS Coronavirus 2 by RT PCR NEGATIVE NEGATIVE   Influenza A by PCR NEGATIVE NEGATIVE   Influenza B by PCR NEGATIVE NEGATIVE   Resp Syncytial Virus by PCR NEGATIVE NEGATIVE  CBC with Differential   Collection Time: 09/27/23  1:44 AM  Result Value Ref Range   WBC 7.2 4.0 - 10.5 K/uL   RBC 3.20 (L) 3.87 - 5.11 MIL/uL   Hemoglobin 9.3 (L) 12.0 - 15.0 g/dL   HCT 16.1 (L) 09.6 - 04.5 %   MCV 89.7 80.0 - 100.0 fL   MCH 29.1 26.0 - 34.0 pg   MCHC 32.4 30.0 - 36.0 g/dL   RDW 40.9 (H) 81.1 - 91.4 %   Platelets 336 150 - 400 K/uL   nRBC 0.0 0.0 - 0.2 %   Neutrophils Relative % 49 %   Neutro Abs 3.5 1.7 - 7.7 K/uL   Lymphocytes Relative 36 %   Lymphs Abs 2.6 0.7 - 4.0 K/uL   Monocytes Relative 8 %   Monocytes Absolute 0.6 0.1 -  1.0 K/uL   Eosinophils Relative 5 %   Eosinophils Absolute 0.3 0.0 - 0.5 K/uL   Basophils Relative 2 %   Basophils Absolute 0.1 0.0 - 0.1 K/uL   Immature Granulocytes 0 %   Abs Immature Granulocytes 0.01 0.00 - 0.07 K/uL  Comprehensive metabolic panel   Collection Time: 09/27/23  1:44 AM  Result Value Ref Range   Sodium 134 (L) 135 - 145 mmol/L   Potassium 3.8 3.5 - 5.1 mmol/L   Chloride 100 98 - 111 mmol/L   CO2 23 22 - 32 mmol/L   Glucose, Bld 112 (H) 70 - 99 mg/dL   BUN 13 6 - 20 mg/dL   Creatinine, Ser 6.04 0.44 - 1.00 mg/dL   Calcium 9.2 8.9 - 54.0 mg/dL   Total Protein 9.0 (H) 6.5 - 8.1 g/dL   Albumin 3.2 (L) 3.5 - 5.0 g/dL   AST 981 (H) 15 - 41 U/L   ALT 103 (H) 0 - 44 U/L   Alkaline Phosphatase 101 38 - 126 U/L   Total Bilirubin 0.8 0.0 - 1.2 mg/dL   GFR, Estimated >19 >14 mL/min   Anion gap 11 5 - 15  hCG, serum, qualitative   Collection Time: 09/27/23  1:44 AM  Result Value Ref Range   Preg, Serum NEGATIVE NEGATIVE   CT Head Wo Contrast Result Date: 09/27/2023 CLINICAL DATA:  Headaches, no known injury, initial encounter EXAM: CT HEAD WITHOUT CONTRAST TECHNIQUE: Contiguous axial images were obtained from the base of the skull through the vertex without intravenous contrast. RADIATION DOSE REDUCTION: This exam was performed according to the departmental dose-optimization program which includes automated exposure control, adjustment of the mA and/or kV according to patient size and/or use of iterative reconstruction technique. COMPARISON:  07/08/2022 FINDINGS: Brain: No evidence of acute infarction, hemorrhage, hydrocephalus, extra-axial collection or mass lesion/mass effect. Vascular: No hyperdense vessel or unexpected calcification. Skull: Normal. Negative for fracture or focal lesion. Sinuses/Orbits: No acute finding. Other: None. IMPRESSION: No acute intracranial abnormality noted. Electronically Signed   By: Alcide Clever M.D.   On: 09/27/2023 00:48   IR IMAGING GUIDED  PORT INSERTION Result Date: 09/22/2023 INDICATION: 48 year old female with history of hepatocellular carcinoma requiring central venous access for chemotherapy administration. EXAM: IMPLANTED PORT A CATH PLACEMENT WITH ULTRASOUND AND FLUOROSCOPIC GUIDANCE COMPARISON:  None Available. MEDICATIONS: None. ANESTHESIA/SEDATION: Moderate (conscious) sedation was employed during this procedure. A total of Versed 2 mg and Fentanyl 100 mcg was administered intravenously. Moderate Sedation Time: 10 minutes. The patient's level of consciousness and vital signs were monitored continuously by radiology nursing throughout the procedure under my direct supervision. CONTRAST:  None FLUOROSCOPY TIME:  0.2 minutes, 0 mGy COMPLICATIONS: None immediate. PROCEDURE: The procedure, risks, benefits, and alternatives were explained to the patient. Questions regarding the procedure were encouraged and answered. The patient understands and consents to the procedure. The right neck and chest were prepped with chlorhexidine in a sterile fashion, and a sterile drape was applied covering the operative field. Maximum barrier sterile technique with sterile gowns and gloves were used for the procedure. A timeout was performed prior to the initiation of the procedure. Ultrasound was used to examine the jugular vein which was compressible and free of internal echoes. A skin marker was used to demarcate the planned venotomy and port pocket incision sites. Local anesthesia was provided to these sites and the subcutaneous tunnel track with 1% lidocaine with 1:100,000 epinephrine. A small incision was created at the jugular access site and blunt dissection was performed of the subcutaneous tissues. Under ultrasound guidance, the jugular vein was accessed with a 21 ga micropuncture needle and an 0.018" wire was inserted to the superior vena cava. Real-time ultrasound guidance was utilized for vascular access including the acquisition of a permanent  ultrasound image documenting patency of the accessed vessel. A 5  Fr micopuncture set was then used, through which a 0.035" Rosen wire was passed under fluoroscopic guidance into the inferior vena cava. An 8 Fr dilator was then placed over the wire. A subcutaneous port pocket was then created along the upper chest wall utilizing a combination of sharp and blunt dissection. The pocket was irrigated with sterile saline, packed with gauze, and observed for hemorrhage. A single lumen clear view power injectable port was chosen for placement. The 8 Fr catheter was tunneled from the port pocket site to the venotomy incision. The port was placed in the pocket. The external catheter was trimmed to appropriate length. The dilator was exchanged for an 8 Fr peel-away sheath under fluoroscopic guidance. The catheter was then placed through the sheath and the sheath was removed. Final catheter positioning was confirmed and documented with a fluoroscopic spot radiograph. The port was accessed with a Huber needle, aspirated, and flushed with heparinized saline. The deep dermal layer of the port pocket incision was closed with interrupted 3-0 Vicryl suture. Dermabond was then placed over the port pocket and neck incisions. The patient tolerated the procedure well without immediate post procedural complication. FINDINGS: After catheter placement, the tip lies within the superior cavoatrial junction. The catheter aspirates and flushes normally and is ready for immediate use. IMPRESSION: Successful placement of a power injectable Port-A-Cath via the right internal jugular vein. The catheter is ready for immediate use. Marliss Coots, MD [facility] Radiology Electronically Signed   By: Marliss Coots M.D.   On: 09/22/2023 15:32   DG Abd 1 View Result Date: 08/30/2023 CLINICAL DATA:  Abdominal pain EXAM: ABDOMEN - 1 VIEW COMPARISON:  None Available. FINDINGS: The bowel gas pattern is  normal. No radio-opaque calculi or other significant radiographic abnormality are seen. IMPRESSION: Negative. Electronically Signed   By: Darliss Cheney M.D.   On: 08/30/2023 17:09    Radiology CT Head Wo Contrast Result Date: 09/27/2023 CLINICAL DATA:  Headaches, no known injury, initial encounter EXAM: CT HEAD WITHOUT CONTRAST TECHNIQUE: Contiguous axial images were obtained from the base of the skull through the vertex without intravenous contrast. RADIATION DOSE REDUCTION: This exam was performed according to the departmental dose-optimization program which includes automated exposure control, adjustment of the mA and/or kV according to patient size and/or use of iterative reconstruction technique. COMPARISON:  07/08/2022 FINDINGS: Brain: No evidence of acute infarction, hemorrhage, hydrocephalus, extra-axial collection or mass lesion/mass effect. Vascular: No hyperdense vessel or unexpected calcification. Skull: Normal. Negative for fracture or focal lesion. Sinuses/Orbits: No acute finding. Other: None. IMPRESSION: No acute intracranial abnormality noted. Electronically Signed   By: Alcide Clever M.D.   On: 09/27/2023 00:48    Procedures Procedures    Medications Ordered in ED Medications  magnesium sulfate IVPB 2 g 50 mL (0 g Intravenous Stopped 09/27/23 0232)  sodium chloride 0.9 % bolus 500 mL (0 mLs Intravenous Stopped 09/27/23 0306)  droperidol (INAPSINE) 2.5 MG/ML injection 1.25 mg (1.25 mg Intravenous Given 09/27/23 0250)  ketorolac (TORADOL) 30 MG/ML injection 9.9 mg (9.9 mg Intravenous Given 09/27/23 0249)    ED Course/ Medical Decision Making/ A&P                                 Medical Decision Making Patient on chemotherapy with headache   Amount and/or Complexity of Data Reviewed External Data Reviewed: notes.    Details: Previous notes reviewed  Labs: ordered.  Details: Negative covid and flu.  Normal white count, 7.2, low hemoglobin 9.3, normal platelets.  Normal  sodium 134 normal potassium 3.8, normal creatinine.  AST 199 ALT 103, elevated but consistent with previous  Radiology: ordered and independent interpretation performed.    Details: Negative head CT by me   Risk Prescription drug management. Risk Details: Patient with headaches on chemotherapy.  No infectious symptoms.  I do not think the patient has meningitis without fever or stiff neck.  No AMS.  I do not think presentation is consistent with dural sinus thrombosis.  No proptosis, EOMI disk margins sharp with intact cognition.  These headaches are recurrent and completely relieved with migrain cocktail.  Stable for discharge.  Strict returns.   Follow up with oncology for ongoing care.      Final Clinical Impression(s) / ED Diagnoses Final diagnoses:  Headache disorder     No signs of systemic illness or infection. The patient is nontoxic-appearing on exam and vital signs are within normal limits.  I have reviewed the triage vital signs and the nursing notes. Pertinent labs & imaging results that were available during my care of the patient were reviewed by me and considered in my medical decision making (see chart for details). After history, exam, and medical workup I feel the patient has been appropriately medically screened and is safe for discharge home. Pertinent diagnoses were discussed with the patient. Patient was given return precautions.    Orders ED Discharge Orders     None         Javaughn Opdahl, MD 09/27/23 [phone]

## 2023-09-29 ENCOUNTER — Other Ambulatory Visit: Payer: Self-pay

## 2023-10-02 ENCOUNTER — Inpatient Hospital Stay: Payer: Self-pay | Admitting: Oncology

## 2023-10-02 ENCOUNTER — Encounter: Payer: Self-pay | Admitting: Oncology

## 2023-10-02 ENCOUNTER — Inpatient Hospital Stay: Payer: Self-pay

## 2023-10-02 ENCOUNTER — Other Ambulatory Visit (HOSPITAL_COMMUNITY): Payer: Self-pay

## 2023-10-02 VITALS — BP 111/67 | HR 92 | Temp 98.3°F | Resp 15 | Ht 60.0 in | Wt 117.3 lb

## 2023-10-02 DIAGNOSIS — C220 Liver cell carcinoma: Secondary | ICD-10-CM

## 2023-10-02 DIAGNOSIS — B181 Chronic viral hepatitis B without delta-agent: Secondary | ICD-10-CM

## 2023-10-02 DIAGNOSIS — G893 Neoplasm related pain (acute) (chronic): Secondary | ICD-10-CM

## 2023-10-02 DIAGNOSIS — F5109 Other insomnia not due to a substance or known physiological condition: Secondary | ICD-10-CM

## 2023-10-02 LAB — CMP (CANCER CENTER ONLY)
ALT: 107 U/L — ABNORMAL HIGH (ref 0–44)
AST: 192 U/L (ref 15–41)
Albumin: 3.7 g/dL (ref 3.5–5.0)
Alkaline Phosphatase: 100 U/L (ref 38–126)
Anion gap: 7 (ref 5–15)
BUN: 9 mg/dL (ref 6–20)
CO2: 25 mmol/L (ref 22–32)
Calcium: 9.3 mg/dL (ref 8.9–10.3)
Chloride: 102 mmol/L (ref 98–111)
Creatinine: 0.59 mg/dL (ref 0.44–1.00)
GFR, Estimated: >60 mL/min (ref >60)
Glucose, Bld: 110 mg/dL — ABNORMAL HIGH (ref 70–99)
Potassium: 3.8 mmol/L (ref 3.5–5.1)
Sodium: 134 mmol/L — ABNORMAL LOW (ref 135–145)
Total Bilirubin: 0.9 mg/dL (ref 0.0–1.2)
Total Protein: 9 g/dL — ABNORMAL HIGH (ref 6.5–8.1)

## 2023-10-02 LAB — CBC WITH DIFFERENTIAL (CANCER CENTER ONLY)
Abs Immature Granulocytes: 0.01 10*3/uL (ref 0.00–0.07)
Basophils Absolute: 0.1 10*3/uL (ref 0.0–0.1)
Basophils Relative: 2 %
Eosinophils Absolute: 0.4 10*3/uL (ref 0.0–0.5)
Eosinophils Relative: 7 %
HCT: 26.1 % — ABNORMAL LOW (ref 36.0–46.0)
Hemoglobin: 8.8 g/dL — ABNORMAL LOW (ref 12.0–15.0)
Immature Granulocytes: 0 %
Lymphocytes Relative: 33 %
Lymphs Abs: 2 10*3/uL (ref 0.7–4.0)
MCH: 28.9 pg (ref 26.0–34.0)
MCHC: 33.7 g/dL (ref 30.0–36.0)
MCV: 85.6 fL (ref 80.0–100.0)
Monocytes Absolute: 0.7 10*3/uL (ref 0.1–1.0)
Monocytes Relative: 11 %
Neutro Abs: 2.9 10*3/uL (ref 1.7–7.7)
Neutrophils Relative %: 47 %
Platelet Count: 318 10*3/uL (ref 150–400)
RBC: 3.05 MIL/uL — ABNORMAL LOW (ref 3.87–5.11)
RDW: 16.9 % — ABNORMAL HIGH (ref 11.5–15.5)
WBC Count: 6.1 10*3/uL (ref 4.0–10.5)
nRBC: 0 % (ref 0.0–0.2)

## 2023-10-02 LAB — PREGNANCY, URINE: Preg Test, Ur: NEGATIVE

## 2023-10-02 MED ORDER — SODIUM CHLORIDE 0.9% FLUSH
10.0000 mL | INTRAVENOUS | Status: DC | PRN
  Administered 2023-10-02: 10 mL

## 2023-10-02 MED ORDER — TRAMADOL HCL 50 MG PO TABS
50.0000 mg | ORAL_TABLET | Freq: Four times a day (QID) | ORAL | 0 refills | Status: DC | PRN
Start: 2023-10-02 — End: 2024-02-05

## 2023-10-02 MED ORDER — LIDOCAINE-PRILOCAINE 2.5-2.5 % EX CREA: 1.0000 | TOPICAL_CREAM | CUTANEOUS | 3 refills | Status: DC | PRN

## 2023-10-02 MED ORDER — SODIUM CHLORIDE 0.9 % IV SOLN
15.0000 mg/kg | Freq: Once | INTRAVENOUS | Status: AC
  Administered 2023-10-02: 800 mg via INTRAVENOUS
  Filled 2023-10-02: qty 32

## 2023-10-02 MED ORDER — HEPARIN SOD (PORK) LOCK FLUSH 100 UNIT/ML IV SOLN
500.0000 [IU] | Freq: Once | INTRAVENOUS | Status: AC | PRN
  Administered 2023-10-02: 500 [IU]

## 2023-10-02 MED ORDER — ALPRAZOLAM 0.5 MG PO TABS: 0.5000 mg | ORAL_TABLET | Freq: Every evening | ORAL | 0 refills | Status: DC | PRN

## 2023-10-02 MED ORDER — SODIUM CHLORIDE 0.9 % IV SOLN: INTRAVENOUS | Status: DC

## 2023-10-02 MED ORDER — SODIUM CHLORIDE 0.9 % IV SOLN
1200.0000 mg | Freq: Once | INTRAVENOUS | Status: AC
  Administered 2023-10-02: 1200 mg via INTRAVENOUS
  Filled 2023-10-02: qty 20

## 2023-10-02 NOTE — Assessment & Plan Note (Addendum)
 Please review oncology history for additional details and timeline of events.  MRI of the abdomen with liver protocol on 08/21/2023 showed numerous hepatic lesions are noted, with a dominant lesion which replaces much of the right lobe of the liver estimated to measure approximately 16.5 x 12.7 x 17.7 cm. This lesion is heterogeneous in signal intensity on T1 and T2 weighted images, but clearly has internal areas of hypervascular enhancement on early phase post gadolinium imaging, with persistent low-level enhancement on more delayed imaging. The other dominant lesion is centered in the superior aspect of the liver, likely within the superior aspect of segment 4A estimated to measure approximately 8.1 x 6.8 x 7.8 cm, with similar imaging characteristics to the previously described lesion, although with greater degree of internal washout and probable pseudo capsule on delayed imaging. Multiple other smaller hypervascular lesions are apparent on arterial phase imaging throughout all aspects of the liver, some of which demonstrate delayed washout.   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  This was confirmed during GI tumor conference on 09/09/2023.  -Previously undiagnosed hepatitis B could have been the contributing factor. -Not a candidate for liver transplant or surgical resection because of the extent of disease. -Discussed diagnosis, prognosis, plan of care, treatment options.  Reviewed NCCN guidelines.   On 08/21/2023, AFP was significantly elevated at 32,160 ng/mL.     CT chest on 08/23/2023 showed no evidence of intrathoracic metastatic disease.  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  Plan made for systemic treatments with atezolizumab plus bevacizumab.  Started this from 09/11/2023.  Tolerated first dose well without any major side effects.  Labs today reveal hemoglobin of 8.8, stable compared to prior.  Normal white count and platelet count.  AST 192, slightly better, ALT  increased at 107.  Total bilirubin is now better at 0.9.  No dose-limiting toxicities.  Will proceed with cycle 2 of atezolizumab plus bevacizumab today.   RTC in 3 weeks for labs, follow-up, cycle 3 of treatment.

## 2023-10-02 NOTE — Progress Notes (Signed)
 [facility]  ONCOLOGY CLINIC PROGRESS NOTE   Patient Care Team: Meryl Crutch, MD as PCP - General (Oncology)  PATIENT NAME: Christina Gutierrez   MR#: [id number] DOB: 11-23-1975  Date of visit: 10/02/2023   ASSESSMENT & PLAN:   Ninfa Giannelli is a 48 y.o. Hispanic lady with no significant past medical history was hospitalized on 08/21/2023 after she presented with worsening right-sided abdominal pain, nausea, vomiting, decreased oral intake.  Workup including MRI of the abdomen showed evidence of multifocal hepatocellular carcinoma.  She was diagnosed with hepatitis B infection during this hospital admission.   Hepatocellular carcinoma (HCC) Please review oncology history for additional details and timeline of events.  MRI of the abdomen with liver protocol on 08/21/2023 showed numerous hepatic lesions are noted, with a dominant lesion which replaces much of the right lobe of the liver estimated to measure approximately 16.5 x 12.7 x 17.7 cm. This lesion is heterogeneous in signal intensity on T1 and T2 weighted images, but clearly has internal areas of hypervascular enhancement on early phase post gadolinium imaging, with persistent low-level enhancement on more delayed imaging. The other dominant lesion is centered in the superior aspect of the liver, likely within the superior aspect of segment 4A estimated to measure approximately 8.1 x 6.8 x 7.8 cm, with similar imaging characteristics to the previously described lesion, although with greater degree of internal washout and probable pseudo capsule on delayed imaging. Multiple other smaller hypervascular lesions are apparent on arterial phase imaging throughout all aspects of the liver, some of which demonstrate delayed washout.   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  This was confirmed during GI tumor conference on 09/09/2023.  -Previously undiagnosed hepatitis B could have  been the contributing factor. -Not a candidate for liver transplant or surgical resection because of the extent of disease. -Discussed diagnosis, prognosis, plan of care, treatment options.  Reviewed NCCN guidelines.   On 08/21/2023, AFP was significantly elevated at 32,160 ng/mL.     CT chest on 08/23/2023 showed no evidence of intrathoracic metastatic disease.  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  Plan made for systemic treatments with atezolizumab plus bevacizumab.  Started this from 09/11/2023.  Tolerated first dose well without any major side effects.  Labs today reveal hemoglobin of 8.8, stable compared to prior.  Normal white count and platelet count.  AST 192, slightly better, ALT increased at 107.  Total bilirubin is now better at 0.9.  No dose-limiting toxicities.  Will proceed with cycle 2 of atezolizumab plus bevacizumab today.   RTC in 3 weeks for labs, follow-up, cycle 3 of treatment.  Cancer associated pain Since we would like to avoid Tylenol containing products, she was prescribed oxycodone previously.  However it caused her to have dizziness and nausea.  She will requested for a milder regimen.  Today I sent a prescription for tramadol 50 mg to be taken every 6 hours as needed for pain.  Will assess tolerance and adjust dose as needed.  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   She is currently on tenofovir.  Follows up with Dr. Daiva Eves at ID clinic.  - Monitor liver function tests with each visit and ammonia levels as indicated    I reviewed lab results and outside records for this visit and discussed relevant results with the patient. Diagnosis, plan of care and treatment options were also discussed in detail with the patient.  Opportunity provided to ask questions and answers provided to her apparent satisfaction. Provided instructions to call our clinic with any problems, questions or  concerns prior to return visit. I recommended to continue follow-up with PCP and sub-specialists. She verbalized understanding and agreed with the plan.   NCCN guidelines have been consulted in the planning of this patient's care.  I spent a total of 30 minutes during this encounter with the patient including review of chart and various tests results, discussions about plan of care and coordination of care plan.  Meryl Crutch, MD  10/02/2023 11:48 AM   [facility] - [facility] [address] [place] [address] Dept: [phone] Dept Fax: [phone]    CHIEF COMPLAINT/ REASON FOR VISIT:   Multifocal hepatocellular carcinoma, diagnosed based on MRI findings.  Current Treatment: Systemic treatment with atezolizumab plus bevacizumab starting from 09/11/2023.  INTERVAL HISTORY:    Discussed the use of AI scribe software for clinical note transcription with the patient, who gave verbal consent to proceed.  Interview conducted with the help of an interpreter.  Lattie Corns Olivianna Higley is here today for repeat clinical assessment.   She tolerated the first treatment well without any major side effects.  She reports experiencing chills but denies fever. She also reports blurred vision, but does not typically wear glasses. The patient experiences nausea and vomiting, which she attributes to headaches. She also reports previous skin itching, which has since resolved. The patient has been having trouble sleeping and has requested medication to help with this. The patient is also taking medication for a hepatitis infection.  I have reviewed the past medical history, past surgical history, social history and family history with the patient and they are unchanged from previous note.  HISTORY OF PRESENT ILLNESS:   ONCOLOGY HISTORY:   48 y.o. Hispanic lady with no significant past medical history, presented to the  emergency department on 08/21/2023 with complaints of right-sided abdominal pain, nausea, vomiting and decreased oral intake for at least 10 days prior to arrival.   On arrival to ED, she was found to have anemia with hemoglobin of 8, white count normal at 6100, platelet count 316,000.  CMP showed elevated AST of 80, ALT increased at 79, bilirubin elevated at 1.8.  Elevated ammonia level of 62.   Right upper quadrant ultrasound showed large heterogenoussolid mass within the liver measuring up to 16.9 cm, suspicious for malignancy. Recommend further evaluation with contrast-enhanced CT or MRI.   CT abdomen pelvis showed: 15.5 x 12.5 cm solid well-circumscribed mass within the right hepatic lobe. Areas of central low-density suggest possibility of central scar. There may be a large feeding vessel. Favor focal nodular hyperplasia although fibrolamellar HCC can have a similar appearance. Recommend further evaluation MRI without and with contrast.   Left spontaneous splenorenal shunt suggest portal venous hypertension. No evidence for cirrhosis. This may be related to mass effect and compression of the portal vein from the large mass.   She was recently diagnosed with chickenpox about 20 days prior to this hospitalization.  Still had some itching from vesicles.   She was admitted for further evaluation and management.   MRI of the abdomen with liver protocol on 08/21/2023 showed numerous hepatic lesions are noted, with a dominant lesion which replaces much of the right lobe of the liver estimated to measure approximately 16.5 x 12.7 x 17.7 cm. This lesion is heterogeneous in signal intensity  on T1 and T2 weighted images, but clearly has internal areas of hypervascular enhancement on early phase post gadolinium imaging, with persistent low-level enhancement on more delayed imaging. The other dominant lesion is centered in the superior aspect of the liver, likely within the superior aspect of segment 4A  estimated to measure approximately 8.1 x 6.8 x 7.8 cm, with similar imaging characteristics to the previously described lesion, although with greater degree of internal washout and probable pseudo capsule on delayed imaging. Multiple other smaller hypervascular lesions are apparent on arterial phase imaging throughout all aspects of the liver, some of which demonstrate delayed washout.   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   On 08/21/2023, AFP was significantly elevated at 32,160 ng/mL.     She was also diagnosed with hepatitis B infection on additional workup.   CT chest on 08/23/2023 showed no evidence of intrathoracic metastatic disease.   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  Her case was discussed in GI tumor conference on 09/09/2023.  Reviewed images with the team and confirmed multifocal hepatocellular carcinoma.   Plan made for systemic treatments with atezolizumab plus bevacizumab.  Started from 09/11/2023.  Oncology History  Hepatocellular carcinoma (HCC)  08/22/2023 Initial Diagnosis   Hepatocellular carcinoma (HCC)   08/27/2023 Cancer Staging   Staging form: Liver, AJCC 8th Edition - Clinical: Stage IIIA (cT3, cN0, cM0) - Signed by Meryl Crutch, MD on 08/27/2023   09/11/2023 -  Chemotherapy   Patient is on Treatment Plan : HEPATOCELLULAR Atezolizumab + Bevacizumab q21d         REVIEW OF SYSTEMS:   Review of Systems  Gastrointestinal:  Positive for abdominal pain (intermittent) and nausea.  Skin:  Itching: resolved.  Neurological:  Positive for dizziness and headaches.    All other pertinent systems were reviewed with the patient and are negative.  ALLERGIES: She has no known allergies.  MEDICATIONS:  Current Outpatient Medications  Medication Sig Dispense Refill   ALPRAZolam (XANAX) 0.5 MG tablet Take 1 tablet (0.5 mg total) by mouth at bedtime as needed for anxiety. 30 tablet 0   lidocaine-prilocaine (EMLA) cream Apply 1 Application  topically as needed. 30 g 3   ondansetron (ZOFRAN) 4 MG tablet Take 1 tablet (4 mg total) by mouth daily as needed for nausea or vomiting. 30 tablet 1   traMADol (ULTRAM) 50 MG tablet Take 1 tablet (50 mg total) by mouth every 6 (six) hours as needed for severe pain (pain score 7-10) or moderate pain (pain score 4-6). 50 tablet 0   pantoprazole (PROTONIX) 40 MG tablet Take 1 tablet (40 mg total) by mouth daily at 6 (six) AM. 30 tablet 0   prochlorperazine (COMPAZINE) 10 MG tablet TAKE 1 TABLET(10 MG) BY MOUTH EVERY 6 HOURS AS NEEDED FOR NAUSEA OR VOMITING (Patient not taking: Reported on 09/22/2023) 30 tablet 1   tenofovir alafenamide (VEMLIDY) 25 MG tablet Take 1 tablet (25 mg total) by mouth daily. 30 tablet 11   No current facility-administered medications for this visit.   Facility-Administered Medications Ordered in Other Visits  Medication Dose Route Frequency Provider Last Rate Last Admin   0.9 %  sodium chloride infusion   Intravenous Continuous Bernis Schreur, MD   Stopped at 10/02/23 1051   sodium chloride flush (NS) 0.9 % injection 10 mL  10 mL Intracatheter PRN Daziyah Cogan, MD   10 mL at 10/02/23 1048     VITALS:   Blood pressure 111/67, pulse 92, temperature 98.3 F (36.8  C), temperature source Temporal, resp. rate 15, height 5' (1.524 m), weight 117 lb 4.8 oz (53.2 kg), last menstrual period 09/16/2023, SpO2 96%.  Wt Readings from Last 3 Encounters:  10/02/23 117 lb 4.8 oz (53.2 kg)  09/22/23 116 lb (52.6 kg)  09/11/23 119 lb 9.6 oz (54.3 kg)    Body mass index is 22.91 kg/m.    Onc Performance Status - 10/02/23 0831       ECOG Perf Status   ECOG Perf Status Restricted in physically strenuous activity but ambulatory and able to carry out work of a light or sedentary nature, e.g., light house work, office work      KPS SCALE   KPS % SCORE Normal activity with effort, some s/s of disease             PHYSICAL EXAM:   Physical Exam Constitutional:       General: She is not in acute distress.    Appearance: Normal appearance.  HENT:     Head: Normocephalic and atraumatic.  Eyes:     General: No scleral icterus.    Conjunctiva/sclera: Conjunctivae normal.  Cardiovascular:     Rate and Rhythm: Normal rate and regular rhythm.     Heart sounds: Normal heart sounds.  Pulmonary:     Effort: Pulmonary effort is normal.     Breath sounds: Normal breath sounds.  Abdominal:     Palpations: There is mass (hepatomegaly).     Tenderness: There is abdominal tenderness (in RUQ area).  Musculoskeletal:     Right lower leg: No edema.     Left lower leg: No edema.  Neurological:     General: No focal deficit present.     Mental Status: She is alert.  Psychiatric:        Mood and Affect: Mood normal.        Behavior: Behavior normal.      LABORATORY DATA:   I have reviewed the data as listed.  Results for orders placed or performed in visit on 10/02/23  Pregnancy, urine  Result Value Ref Range   Preg Test, Ur NEGATIVE NEGATIVE  CMP (Cancer Center only)  Result Value Ref Range   Sodium 134 (L) 135 - 145 mmol/L   Potassium 3.8 3.5 - 5.1 mmol/L   Chloride 102 98 - 111 mmol/L   CO2 25 22 - 32 mmol/L   Glucose, Bld 110 (H) 70 - 99 mg/dL   BUN 9 6 - 20 mg/dL   Creatinine 3.08 6.57 - 1.00 mg/dL   Calcium 9.3 8.9 - 84.6 mg/dL   Total Protein 9.0 (H) 6.5 - 8.1 g/dL   Albumin 3.7 3.5 - 5.0 g/dL   AST 962 (HH) 15 - 41 U/L   ALT 107 (H) 0 - 44 U/L   Alkaline Phosphatase 100 38 - 126 U/L   Total Bilirubin 0.9 0.0 - 1.2 mg/dL   GFR, Estimated >95 >28 mL/min   Anion gap 7 5 - 15  CBC with Differential (Cancer Center Only)  Result Value Ref Range   WBC Count 6.1 4.0 - 10.5 K/uL   RBC 3.05 (L) 3.87 - 5.11 MIL/uL   Hemoglobin 8.8 (L) 12.0 - 15.0 g/dL   HCT 41.3 (L) 24.4 - 01.0 %   MCV 85.6 80.0 - 100.0 fL   MCH 28.9 26.0 - 34.0 pg   MCHC 33.7 30.0 - 36.0 g/dL   RDW 27.2 (H) 53.6 - 64.4 %   Platelet Count 318 150 -  400 K/uL   nRBC 0.0 0.0  - 0.2 %   Neutrophils Relative % 47 %   Neutro Abs 2.9 1.7 - 7.7 K/uL   Lymphocytes Relative 33 %   Lymphs Abs 2.0 0.7 - 4.0 K/uL   Monocytes Relative 11 %   Monocytes Absolute 0.7 0.1 - 1.0 K/uL   Eosinophils Relative 7 %   Eosinophils Absolute 0.4 0.0 - 0.5 K/uL   Basophils Relative 2 %   Basophils Absolute 0.1 0.0 - 0.1 K/uL   Immature Granulocytes 0 %   Abs Immature Granulocytes 0.01 0.00 - 0.07 K/uL     RADIOGRAPHIC STUDIES:  I have personally reviewed the radiological images as listed and agree with the findings in the report.  CT Head Wo Contrast Result Date: 09/27/2023 CLINICAL DATA:  Headaches, no known injury, initial encounter EXAM: CT HEAD WITHOUT CONTRAST TECHNIQUE: Contiguous axial images were obtained from the base of the skull through the vertex without intravenous contrast. RADIATION DOSE REDUCTION: This exam was performed according to the departmental dose-optimization program which includes automated exposure control, adjustment of the mA and/or kV according to patient size and/or use of iterative reconstruction technique. COMPARISON:  07/08/2022 FINDINGS: Brain: No evidence of acute infarction, hemorrhage, hydrocephalus, extra-axial collection or mass lesion/mass effect. Vascular: No hyperdense vessel or unexpected calcification. Skull: Normal. Negative for fracture or focal lesion. Sinuses/Orbits: No acute finding. Other: None. IMPRESSION: No acute intracranial abnormality noted. Electronically Signed   By: Alcide Clever M.D.   On: 09/27/2023 00:48   IR IMAGING GUIDED PORT INSERTION Result Date: 09/22/2023 INDICATION: 48 year old female with history of hepatocellular carcinoma requiring central venous access for chemotherapy administration. EXAM: IMPLANTED PORT A CATH PLACEMENT WITH ULTRASOUND AND FLUOROSCOPIC GUIDANCE COMPARISON:  None Available. MEDICATIONS: None. ANESTHESIA/SEDATION: Moderate (conscious) sedation was employed during this procedure. A total of Versed 2  mg and Fentanyl 100 mcg was administered intravenously. Moderate Sedation Time: 10 minutes. The patient's level of consciousness and vital signs were monitored continuously by radiology nursing throughout the procedure under my direct supervision. CONTRAST:  None FLUOROSCOPY TIME:  0.2 minutes, 0 mGy COMPLICATIONS: None immediate. PROCEDURE: The procedure, risks, benefits, and alternatives were explained to the patient. Questions regarding the procedure were encouraged and answered. The patient understands and consents to the procedure. The right neck and chest were prepped with chlorhexidine in a sterile fashion, and a sterile drape was applied covering the operative field. Maximum barrier sterile technique with sterile gowns and gloves were used for the procedure. A timeout was performed prior to the initiation of the procedure. Ultrasound was used to examine the jugular vein which was compressible and free of internal echoes. A skin marker was used to demarcate the planned venotomy and port pocket incision sites. Local anesthesia was provided to these sites and the subcutaneous tunnel track with 1% lidocaine with 1:100,000 epinephrine. A small incision was created at the jugular access site and blunt dissection was performed of the subcutaneous tissues. Under ultrasound guidance, the jugular vein was accessed with a 21 ga micropuncture needle and an 0.018" wire was inserted to the superior vena cava. Real-time ultrasound guidance was utilized for vascular access including the acquisition of a permanent ultrasound image documenting patency of the accessed vessel. A 5 Fr micopuncture set was then used, through which a 0.035" Rosen wire was passed under fluoroscopic guidance into the inferior vena cava. An 8 Fr dilator was then placed over the wire. A subcutaneous port pocket was then created along the upper chest  wall utilizing a combination of sharp and blunt dissection. The pocket was irrigated with sterile  saline, packed with gauze, and observed for hemorrhage. A single lumen clear view power injectable port was chosen for placement. The 8 Fr catheter was tunneled from the port pocket site to the venotomy incision. The port was placed in the pocket. The external catheter was trimmed to appropriate length. The dilator was exchanged for an 8 Fr peel-away sheath under fluoroscopic guidance. The catheter was then placed through the sheath and the sheath was removed. Final catheter positioning was confirmed and documented with a fluoroscopic spot radiograph. The port was accessed with a Huber needle, aspirated, and flushed with heparinized saline. The deep dermal layer of the port pocket incision was closed with interrupted 3-0 Vicryl suture. Dermabond was then placed over the port pocket and neck incisions. The patient tolerated the procedure well without immediate post procedural complication. FINDINGS: After catheter placement, the tip lies within the superior cavoatrial junction. The catheter aspirates and flushes normally and is ready for immediate use. IMPRESSION: Successful placement of a power injectable Port-A-Cath via the right internal jugular vein. The catheter is ready for immediate use. Marliss Coots, MD [facility] Radiology Electronically Signed   By: Marliss Coots M.D.   On: 09/22/2023 15:32    CODE STATUS:  Code Status History     Date Active Date Inactive Code Status Order ID Comments User Context   08/29/2023 0409 08/31/2023 1925 Full Code [id number]  Darlin Drop, DO ED   08/21/2023 0400 08/23/2023 2127 Full Code [id number]  Tereasa Coop, MD ED    Questions for Most Recent Historical Code Status (Order [id number])     Question Answer   By: Consent: discussion documented in EHR            Orders Placed This Encounter  Procedures   AFP tumor marker    Standing Status:   Future    Expected Date:   11/13/2023    Expiration Date:    11/12/2024     Future Appointments  Date Time Provider Department Center  10/23/2023  8:15 AM [facility] LAB [facility] None  10/23/2023  8:45 AM Arlis Everly, MD [facility] None  10/23/2023  9:45 AM [facility] INFUSION [facility] None  11/13/2023  8:15 AM [facility] LAB [facility] None  11/13/2023  8:45 AM Jevon Shells, MD [facility] None  11/13/2023  9:45 AM [facility] INFUSION [facility] None  12/04/2023  8:15 AM [facility] LAB [facility] None  12/04/2023  8:45 AM Danitza Schoenfeldt, MD [facility] None  12/04/2023  9:45 AM [facility] INFUSION [facility] None  12/25/2023 10:00 AM [facility] LAB [facility] None  12/25/2023 10:30 AM Erendira Crabtree, MD [facility] None  12/25/2023 11:30 AM [facility] INFUSION [facility] None     This document was completed utilizing speech recognition software. Grammatical errors, random word insertions, pronoun errors, and incomplete sentences are an occasional consequence of this system due to software limitations, ambient noise, and hardware issues. Any formal questions or concerns about the content, text or information contained within the body of this dictation should be directly addressed to the provider for clarification.

## 2023-10-02 NOTE — Progress Notes (Signed)
 Patient seen by Dr. Archie Patten Pasam today  Vitals are within treatment parameters:Yes   Labs are within treatment parameters: Yes   *AST was 192, however, Dr. Arlana Pouch stated that he changed parameters so should not raise concerns.  Treatment plan has been signed: Yes   Per physician team, Patient is ready for treatment. Please note the following modifications:  Lab parameters changed for AST. Okay to treat.

## 2023-10-02 NOTE — Assessment & Plan Note (Signed)
 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   She is currently on tenofovir.  Follows up with Dr. Daiva Eves at ID clinic.  - Monitor liver function tests with each visit and ammonia levels as indicated

## 2023-10-02 NOTE — Progress Notes (Signed)
 CRITICAL VALUE STICKER  CRITICAL VALUE:  AST 192  RECEIVER (on-site recipient of call): Alfonzo Feller, RN  DATE & TIME NOTIFIED:  10/02/2023  0845 MESSENGER (representative from lab): Mertie Clause  MD NOTIFIED: Dr. Arlana Pouch  TIME OF NOTIFICATION:0849  RESPONSE:  Aware. Dr. Arlana Pouch said this was an improvement.

## 2023-10-02 NOTE — Assessment & Plan Note (Signed)
 Since we would like to avoid Tylenol containing products, she was prescribed oxycodone previously.  However it caused her to have dizziness and nausea.  She will requested for a milder regimen.  Today I sent a prescription for tramadol 50 mg to be taken every 6 hours as needed for pain.  Will assess tolerance and adjust dose as needed.

## 2023-10-02 NOTE — Patient Instructions (Addendum)
 Instrucciones al darle de alta: Discharge Instructions Gracias por elegir al [facility] de Cncer de [place] para brindarle atencin mdica de oncologa y Teacher, English as a foreign language.   Si usted tiene una cita de laboratorio con [employer] de [place], por favor vaya directamente al [employer] de Cncer y regstrese en el rea de Engineer, maintenance (IT).   Use ropa cmoda y [place] para tener fcil acceso a las vas del Portacath (acceso venoso de Set designer duracin) o la lnea PICC (catter central colocado por va perifrica).   Nos esforzamos por ofrecerle tiempo de calidad con su proveedor. Es posible que tenga que volver a programar su cita si llega tarde (15 minutos o ms).  El llegar tarde le afecta a usted y a otros pacientes cuyas citas son posteriores a Armed forces operational officer.  Adems, si usted falta a tres o ms citas sin avisar a la oficina, puede ser retirado(a) de la clnica a discrecin del proveedor.      Para las solicitudes de renovacin de recetas, pida a su farmacia que se ponga en contacto con nuestra oficina y deje que transcurran 72 horas para que se complete el proceso de las renovaciones.    Hoy usted recibi los siguientes agentes de quimioterapia e/o inmunoterapia: Ecologist, Bevacizumab      Para ayudar a prevenir las nuseas y los vmitos despus de su tratamiento, le recomendamos que tome su medicamento para las nuseas segn las indicaciones.  LOS SNTOMAS QUE DEBEN COMUNICARSE INMEDIATAMENTE SE INDICAN A CONTINUACIN: *FIEBRE SUPERIOR A 100.4 F (38 C) O MS *ESCALOFROS O SUDORACIN *NUSEAS Y VMITOS QUE NO SE CONTROLAN CON EL MEDICAMENTO PARA LAS NUSEAS *DIFICULTAD INUSUAL PARA RESPIRAR  *MORETONES O HEMORRAGIAS NO HABITUALES *PROBLEMAS URINARIOS (dolor o ardor al Geographical information systems officer o frecuencia para Geographical information systems officer) *PROBLEMAS INTESTINALES (diarrea inusual, estreimiento, dolor cerca del ano) SENSIBILIDAD EN LA BOCA Y EN LA GARGANTA CON O SIN LA PRESENCIA DE LCERAS (dolor de garganta, llagas en la boca o dolor de  muelas/dientes) ERUPCIN, HINCHAZN O DOLORES INUSUALES FLUJO VAGINAL INUSUAL O PICAZN/RASQUIA    Los puntos marcados con un asterisco ( *) indican una posible emergencia y debe hacer un seguimiento tan pronto como le sea posible o vaya al Departamento de Emergencias si se le presenta algn problema.  Por favor, muestre la [place] DE ADVERTENCIA DE Marc Morgans DE ADVERTENCIA DE Gardiner Fanti al registrarse en [address] de Emergencias y a la enfermera de triaje.  Si tiene preguntas despus de su visita o necesita cancelar o volver a programar su cita, por favor pngase en contacto con [facility] - A DEPT OF [facility]  Dept: [phone] y siga las instrucciones. Las horas de oficina son de 8:00 a.m. a 4:30 p.m. de lunes a viernes. Por favor, tenga en cuenta que los mensajes de voz que se dejan despus de las 4:00 p.m. posiblemente no se devolvern hasta el siguiente da de [place].  Cerramos los fines de semana y [employer]. En todo momento tiene acceso a una enfermera para preguntas urgentes. Por favor, llame al nmero principal de la clnica Dept: [phone] y siga las instrucciones.   Para cualquier pregunta que no sea de carcter urgente, tambin puede ponerse en contacto con su proveedor [employer]. Ahora ofrecemos visitas electrnicas para cualquier persona mayor de 18 aos que solicite atencin mdica en lnea para los sntomas que no sean urgentes. Para ms detalles vaya a [email or web address].   Tambin puede bajar la aplicacin de MyChart! Vaya a  la tienda de aplicaciones, busque "MyChart", abra la aplicacin, seleccione [place], e ingrese con su nombre de usuario y la contrasea de [name].

## 2023-10-08 ENCOUNTER — Other Ambulatory Visit: Payer: Self-pay

## 2023-10-15 ENCOUNTER — Other Ambulatory Visit: Payer: Self-pay

## 2023-10-16 NOTE — Assessment & Plan Note (Addendum)
 MRI of the abdomen with liver protocol on 08/21/2023 showed numerous hepatic lesions are noted, with a dominant lesion which replaces much of the right lobe of the liver estimated to measure approximately 16.5 x 12.7 x 17.7 cm. This lesion is heterogeneous in signal intensity on T1 and T2 weighted images, but clearly has internal areas of hypervascular enhancement on early phase post gadolinium imaging, with persistent low-level enhancement on more delayed imaging. The other dominant lesion is centered in the superior aspect of the liver, likely within the superior aspect of segment 4A estimated to measure approximately 8.1 x 6.8 x 7.8 cm, with similar imaging characteristics to the previously described lesion, although with greater degree of internal washout and probable pseudo capsule on delayed imaging. Multiple other smaller hypervascular lesions are apparent on arterial phase imaging throughout all aspects of the liver, some of which demonstrate delayed washout. 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  This was confirmed during GI tumor conference on 09/09/2023. -Previously undiagnosed hepatitis B could have been the contributing factor. -Not a candidate for liver transplant or surgical resection because of the extent of disease. -Discussed diagnosis, prognosis, plan of care, treatment options.  Reviewed NCCN guidelines.  On 08/21/2023, AFP was significantly elevated at 32,160 ng/mL.    CT chest on 08/23/2023 showed no evidence of intrathoracic metastatic disease.  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 Plan made for systemic treatments with atezolizumab  plus bevacizumab .  Started this from 09/11/2023.

## 2023-10-16 NOTE — Progress Notes (Signed)
 Patient Care Team: Christina Millman, MD as PCP - General (Oncology)  Clinic Day:  10/23/2023  Referring physician: [name], MD  ASSESSMENT & PLAN:   Assessment & Plan: Hepatocellular carcinoma (HCC) MRI of the abdomen with liver protocol on 08/21/2023 showed numerous hepatic lesions are noted, with a dominant lesion which replaces much of the right lobe of the liver estimated to measure approximately 16.5 x 12.7 x 17.7 cm. This lesion is heterogeneous in signal intensity on T1 and T2 weighted images, but clearly has internal areas of hypervascular enhancement on early phase post gadolinium imaging, with persistent low-level enhancement on more delayed imaging. The other dominant lesion is centered in the superior aspect of the liver, likely within the superior aspect of segment 4A estimated to measure approximately 8.1 x 6.8 x 7.8 cm, with similar imaging characteristics to the previously described lesion, although with greater degree of internal washout and probable pseudo capsule on delayed imaging. Multiple other smaller hypervascular lesions are apparent on arterial phase imaging throughout all aspects of the liver, some of which demonstrate delayed washout. 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  This was confirmed during GI tumor conference on 09/09/2023. -Previously undiagnosed hepatitis B could have been the contributing factor. -Not a candidate for liver transplant or surgical resection because of the extent of disease. -Discussed diagnosis, prognosis, plan of care, treatment options.  Reviewed NCCN guidelines.  On 08/21/2023, AFP was significantly elevated at 32,160 ng/mL.    CT chest on 08/23/2023 showed no evidence of intrathoracic metastatic disease.  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 Plan made for systemic treatments with atezolizumab  plus bevacizumab .  Started this from 09/11/2023.       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 improved from  last check. Normal WBC and PLT counts. Continue to monitor.   Abnormal liver functions Today, AST 206, ALT 110, and AlkP 102. T. Bili normal at 0.8. Notified Dr. Autumn about rising LFTs. No dose adjustments made today.   Mucositis Patient has reported some episodes of mild gum bleeding.  No specific cause noted.  Not painful.  No sores on the inside of her mouth or cheeks.  No sores on her tongue.  We discussed warm salt water rinses/gargles.  Will help to prevent infection.  Consider prescription for Magic mouthwash if this worsens.  Plan Interpreter present during today's visit. Reviewed labs. - Mild and stable anemia.  Significant elevation of LFTs.  Dr.Pasam notified.  No changes to current treatment with atezolizumab  and bevacizumab . Proceed with cycle 3 day 1 of atezolizumab  and bevacizumab . Return for labs, follow-up, and treatment with atezolizumab  and bevacizumab  in 3 weeks, as scheduled.  The patient understands the plans discussed today and is in agreement with them.  She knows to contact our office if she develops concerns prior to her next appointment.  I provided 25 minutes of face-to-face time during this encounter and > 50% was spent counseling as documented under my assessment and plan.    Christina FORBES Lessen, NP  [facility] - A DEPT OF [facility] [address] [place] [address] Dept: [phone] Dept Fax: [phone]   No orders of the defined types were placed in this encounter.     CHIEF COMPLAINT:  CC: Hepatocellular carcinoma  Current Treatment: Atezolizumab  plus bevacizumab  starting 09/11/2023  INTERVAL HISTORY:  Christina Gutierrez is here today for repeat clinical assessment.  She was last seen  on 10/02/2023 by Dr. Autumn.  She presents for Cycle 3 day 1 of atezolizumab  and bevacizumab . She has had anemia after prior treatments with Hgb of 8.8.  Today this is slightly improved with Hgb of 9.0 and  HCT of 26.6.  Liver functions are moderately elevated.  She denies abdominal pain, nausea, vomiting, or diarrhea.  She does have intermittent episodes of constipation for which she takes MiraLAX .  She states this does help.  Has noticed some intermittent episodes of gums bleeding.  She has not noticed any specific cause.  This is nonpainful.  No other sores in the mouth or on the tongue.  She denies chest pain, chest pressure, or shortness of breath. She denies headaches or visual disturbances. She denies fevers or chills. She denies pain. Her appetite is good. Her weight has been stable.  I have reviewed the past medical history, past surgical history, social history and family history with the patient and they are unchanged from previous note.  ALLERGIES:  has no known allergies.  MEDICATIONS:  Current Outpatient Medications  Medication Sig Dispense Refill   ALPRAZolam  (XANAX ) 0.5 MG tablet Take 1 tablet (0.5 mg total) by mouth at bedtime as needed for anxiety. 30 tablet 0   lidocaine -prilocaine  (EMLA ) cream Apply 1 Application topically as needed. 30 g 3   ondansetron  (ZOFRAN ) 4 MG tablet Take 1 tablet (4 mg total) by mouth daily as needed for nausea or vomiting. 30 tablet 1   prochlorperazine  (COMPAZINE ) 10 MG tablet TAKE 1 TABLET(10 MG) BY MOUTH EVERY 6 HOURS AS NEEDED FOR NAUSEA OR VOMITING 30 tablet 1   tenofovir  alafenamide (VEMLIDY ) 25 MG tablet Take 1 tablet (25 mg total) by mouth daily. 30 tablet 11   traMADol  (ULTRAM ) 50 MG tablet Take 1 tablet (50 mg total) by mouth every 6 (six) hours as needed for severe pain (pain score 7-10) or moderate pain (pain score 4-6). 50 tablet 0   pantoprazole  (PROTONIX ) 40 MG tablet Take 1 tablet (40 mg total) by mouth daily at 6 (six) AM. 30 tablet 1   No current facility-administered medications for this visit.   Facility-Administered Medications Ordered in Other Visits  Medication Dose Route Frequency Provider Last Rate Last Admin   0.9 %  sodium  chloride infusion   Intravenous Continuous Gutierrez, Avinash, MD   Stopped at 10/23/23 1131   sodium chloride  flush (NS) 0.9 % injection 10 mL  10 mL Intracatheter PRN Gutierrez, Avinash, MD   10 mL at 10/23/23 1131    HISTORY OF PRESENT ILLNESS:   Oncology History  Hepatocellular carcinoma (HCC)  08/22/2023 Initial Diagnosis   Hepatocellular carcinoma (HCC)   08/27/2023 Cancer Staging   Staging form: Liver, AJCC 8th Edition - Clinical: Stage IIIA (cT3, cN0, cM0) - Signed by Christina Millman, MD on 08/27/2023   09/11/2023 -  Chemotherapy   Patient is on Treatment Plan : HEPATOCELLULAR Atezolizumab  + Bevacizumab  q21d         REVIEW OF SYSTEMS:   Constitutional: Denies fevers, chills or abnormal weight loss Eyes: Denies blurriness of vision Ears, nose, mouth, throat, and face: Denies mucositis or sore throat. She has had a few episodes of bleeding gums without other mouth sores  Respiratory: Denies cough, dyspnea or wheezes Cardiovascular: Denies palpitation, chest discomfort or lower extremity swelling Gastrointestinal:  Denies nausea, heartburn or change in bowel habits Skin: Denies abnormal skin rashes Lymphatics: Denies new lymphadenopathy or easy bruising Neurological:Denies numbness, tingling or new weaknesses Behavioral/Psych: Mood is stable, no new  changes  All other systems were reviewed with the patient and are negative.   VITALS:   Today's Vitals   10/23/23 0835 10/23/23 0839  BP: 122/88   Pulse: 86   Resp: 17   Temp: 98 F (36.7 C)   SpO2: 99%   Weight: 118 lb (53.5 kg)   PainSc:  0-No pain   Body mass index is 23.05 kg/m.    Wt Readings from Last 3 Encounters:  10/23/23 118 lb (53.5 kg)  10/02/23 117 lb 4.8 oz (53.2 kg)  09/22/23 116 lb (52.6 kg)    Body mass index is 23.05 kg/m.  Performance status (ECOG): 1 - Symptomatic but completely ambulatory  PHYSICAL EXAM:   GENERAL:alert, no distress and comfortable SKIN: skin color, texture, turgor are normal,  no rashes or significant lesions EYES: normal, Conjunctiva are pink and non-injected, sclera clear OROPHARYNX:no exudate, no erythema and lips, buccal mucosa, and tongue normal  NECK: supple, thyroid  normal size, non-tender, without nodularity LYMPH:  no palpable lymphadenopathy in the cervical, axillary or inguinal LUNGS: clear to auscultation and percussion with normal breathing effort HEART: regular rate & rhythm and no murmurs and no lower extremity edema ABDOMEN:abdomen firm, non-tender and normal bowel sounds Musculoskeletal:no cyanosis of digits and no clubbing  NEURO: alert & oriented x 3 with fluent speech, no focal motor/sensory deficits  LABORATORY DATA:  I have reviewed the data as listed    Component Value Date/Time   NA 135 10/23/2023 0815   K 4.4 10/23/2023 0815   CL 104 10/23/2023 0815   CO2 23 10/23/2023 0815   GLUCOSE 88 10/23/2023 0815   BUN 7 10/23/2023 0815   CREATININE 0.53 10/23/2023 0815   CALCIUM 9.7 10/23/2023 0815   PROT 9.0 (H) 10/23/2023 0815   ALBUMIN 3.9 10/23/2023 0815   AST 206 (HH) 10/23/2023 0815   ALT 110 (H) 10/23/2023 0815   ALKPHOS 102 10/23/2023 0815   BILITOT 0.8 10/23/2023 0815   GFRNONAA >60 10/23/2023 0815   GFRAA >60 02/26/2018 0914    Lab Results  Component Value Date   WBC 6.1 10/23/2023   NEUTROABS 2.9 10/23/2023   HGB 9.0 (L) 10/23/2023   HCT 26.6 (L) 10/23/2023   MCV 83.1 10/23/2023   PLT 282 10/23/2023    RADIOGRAPHIC STUDIES: CT Head Wo Contrast Result Date: 09/27/2023 CLINICAL DATA:  Headaches, no known injury, initial encounter EXAM: CT HEAD WITHOUT CONTRAST TECHNIQUE: Contiguous axial images were obtained from the base of the skull through the vertex without intravenous contrast. RADIATION DOSE REDUCTION: This exam was performed according to the departmental dose-optimization program which includes automated exposure control, adjustment of the mA and/or kV according to patient size and/or use of iterative  reconstruction technique. COMPARISON:  07/08/2022 FINDINGS: Brain: No evidence of acute infarction, hemorrhage, hydrocephalus, extra-axial collection or mass lesion/mass effect. Vascular: No hyperdense vessel or unexpected calcification. Skull: Normal. Negative for fracture or focal lesion. Sinuses/Orbits: No acute finding. Other: None. IMPRESSION: No acute intracranial abnormality noted. Electronically Signed   By: Oneil Devonshire M.D.   On: 09/27/2023 00:48

## 2023-10-19 ENCOUNTER — Other Ambulatory Visit: Payer: Self-pay | Admitting: Pharmacy Technician

## 2023-10-19 ENCOUNTER — Other Ambulatory Visit: Payer: Self-pay

## 2023-10-19 ENCOUNTER — Other Ambulatory Visit (HOSPITAL_COMMUNITY): Payer: Self-pay

## 2023-10-19 NOTE — Progress Notes (Signed)
 Specialty Pharmacy Refill Coordination Note  Christina Gutierrez is a 48 y.o. female contacted today regarding refills of specialty medication(s) Tenofovir Alafenamide Fumarate (VEMLIDY)  Patient called in with a Interpreter   Patient requested Pickup at [facility] at [place] date: 10/20/23   Medication will be filled on 10/20/23.

## 2023-10-23 ENCOUNTER — Inpatient Hospital Stay: Payer: Self-pay

## 2023-10-23 ENCOUNTER — Telehealth: Payer: Self-pay

## 2023-10-23 ENCOUNTER — Ambulatory Visit: Payer: Self-pay

## 2023-10-23 ENCOUNTER — Encounter: Payer: Self-pay | Admitting: Nurse Practitioner

## 2023-10-23 ENCOUNTER — Inpatient Hospital Stay: Payer: Self-pay | Attending: Oncology

## 2023-10-23 ENCOUNTER — Ambulatory Visit: Payer: Self-pay | Admitting: Oncology

## 2023-10-23 ENCOUNTER — Other Ambulatory Visit: Payer: Self-pay

## 2023-10-23 ENCOUNTER — Inpatient Hospital Stay (HOSPITAL_BASED_OUTPATIENT_CLINIC_OR_DEPARTMENT_OTHER): Payer: Self-pay | Attending: Oncology | Admitting: Nurse Practitioner

## 2023-10-23 VITALS — BP 122/88 | HR 86 | Temp 98.0°F | Resp 17 | Wt 118.0 lb

## 2023-10-23 DIAGNOSIS — Z79899 Other long term (current) drug therapy: Secondary | ICD-10-CM | POA: Insufficient documentation

## 2023-10-23 DIAGNOSIS — R7989 Other specified abnormal findings of blood chemistry: Secondary | ICD-10-CM | POA: Insufficient documentation

## 2023-10-23 DIAGNOSIS — T451X5A Adverse effect of antineoplastic and immunosuppressive drugs, initial encounter: Secondary | ICD-10-CM | POA: Insufficient documentation

## 2023-10-23 DIAGNOSIS — C220 Liver cell carcinoma: Secondary | ICD-10-CM

## 2023-10-23 DIAGNOSIS — Z95828 Presence of other vascular implants and grafts: Secondary | ICD-10-CM | POA: Insufficient documentation

## 2023-10-23 DIAGNOSIS — Z5112 Encounter for antineoplastic immunotherapy: Secondary | ICD-10-CM | POA: Insufficient documentation

## 2023-10-23 DIAGNOSIS — D6481 Anemia due to antineoplastic chemotherapy: Secondary | ICD-10-CM | POA: Insufficient documentation

## 2023-10-23 LAB — CBC WITH DIFFERENTIAL (CANCER CENTER ONLY)
Abs Immature Granulocytes: 0.01 10*3/uL (ref 0.00–0.07)
Basophils Absolute: 0.1 10*3/uL (ref 0.0–0.1)
Basophils Relative: 2 %
Eosinophils Absolute: 0.5 10*3/uL (ref 0.0–0.5)
Eosinophils Relative: 8 %
HCT: 26.6 % — ABNORMAL LOW (ref 36.0–46.0)
Hemoglobin: 9 g/dL — ABNORMAL LOW (ref 12.0–15.0)
Immature Granulocytes: 0 %
Lymphocytes Relative: 34 %
Lymphs Abs: 2.1 10*3/uL (ref 0.7–4.0)
MCH: 28.1 pg (ref 26.0–34.0)
MCHC: 33.8 g/dL (ref 30.0–36.0)
MCV: 83.1 fL (ref 80.0–100.0)
Monocytes Absolute: 0.5 10*3/uL (ref 0.1–1.0)
Monocytes Relative: 8 %
Neutro Abs: 2.9 10*3/uL (ref 1.7–7.7)
Neutrophils Relative %: 48 %
Platelet Count: 282 10*3/uL (ref 150–400)
RBC: 3.2 MIL/uL — ABNORMAL LOW (ref 3.87–5.11)
RDW: 18.6 % — ABNORMAL HIGH (ref 11.5–15.5)
WBC Count: 6.1 10*3/uL (ref 4.0–10.5)
nRBC: 0 % (ref 0.0–0.2)

## 2023-10-23 LAB — CMP (CANCER CENTER ONLY)
ALT: 110 U/L — ABNORMAL HIGH (ref 0–44)
AST: 206 U/L (ref 15–41)
Albumin: 3.9 g/dL (ref 3.5–5.0)
Alkaline Phosphatase: 102 U/L (ref 38–126)
Anion gap: 8 (ref 5–15)
BUN: 7 mg/dL (ref 6–20)
CO2: 23 mmol/L (ref 22–32)
Calcium: 9.7 mg/dL (ref 8.9–10.3)
Chloride: 104 mmol/L (ref 98–111)
Creatinine: 0.53 mg/dL (ref 0.44–1.00)
GFR, Estimated: >60 mL/min (ref >60)
Glucose, Bld: 88 mg/dL (ref 70–99)
Potassium: 4.4 mmol/L (ref 3.5–5.1)
Sodium: 135 mmol/L (ref 135–145)
Total Bilirubin: 0.8 mg/dL (ref 0.0–1.2)
Total Protein: 9 g/dL — ABNORMAL HIGH (ref 6.5–8.1)

## 2023-10-23 LAB — TOTAL PROTEIN, URINE DIPSTICK: Protein, ur: NEGATIVE mg/dL

## 2023-10-23 LAB — TSH: TSH: 4.321 u[IU]/mL (ref 0.350–4.500)

## 2023-10-23 LAB — PREGNANCY, URINE: Preg Test, Ur: NEGATIVE

## 2023-10-23 MED ORDER — SODIUM CHLORIDE 0.9 % IV SOLN: INTRAVENOUS | Status: DC

## 2023-10-23 MED ORDER — HEPARIN SOD (PORK) LOCK FLUSH 100 UNIT/ML IV SOLN
500.0000 [IU] | Freq: Once | INTRAVENOUS | Status: AC | PRN
Start: 2023-10-23 — End: 2023-10-23
  Administered 2023-10-23: 500 [IU]

## 2023-10-23 MED ORDER — SODIUM CHLORIDE 0.9% FLUSH
10.0000 mL | Freq: Once | INTRAVENOUS | Status: AC
Start: 2023-10-23 — End: 2023-10-23
  Administered 2023-10-23: 10 mL

## 2023-10-23 MED ORDER — SODIUM CHLORIDE 0.9 % IV SOLN
15.0000 mg/kg | Freq: Once | INTRAVENOUS | Status: AC
  Administered 2023-10-23: 800 mg via INTRAVENOUS
  Filled 2023-10-23: qty 32

## 2023-10-23 MED ORDER — PANTOPRAZOLE SODIUM 40 MG PO TBEC: 40.0000 mg | DELAYED_RELEASE_TABLET | Freq: Every day | ORAL | 1 refills | Status: DC

## 2023-10-23 MED ORDER — SODIUM CHLORIDE 0.9% FLUSH
10.0000 mL | INTRAVENOUS | Status: DC | PRN
  Administered 2023-10-23: 10 mL

## 2023-10-23 MED ORDER — SODIUM CHLORIDE 0.9 % IV SOLN
1200.0000 mg | Freq: Once | INTRAVENOUS | Status: AC
  Administered 2023-10-23: 1200 mg via INTRAVENOUS
  Filled 2023-10-23: qty 20

## 2023-10-23 NOTE — Telephone Encounter (Signed)
 CRITICAL VALUE STICKER  CRITICAL VALUE: AST 200  RECEIVER (on-site recipient of call): Cleda Curly CMA  DATE & TIME NOTIFIED: 10/23/2023 0920  MESSENGER (representative from lab): Heather in lab  MD NOTIFIED: Rande Bushy NP  TIME OF NOTIFICATION: 0920  RESPONSE:  Made physician aware

## 2023-10-23 NOTE — Patient Instructions (Signed)
 Instrucciones al darle de alta: Discharge Instructions Gracias por elegir al [facility] de Cncer de  para brindarle atencin mdica de oncologa y Teacher, English as a foreign language.   Si usted tiene una cita de laboratorio con el Cent[facility] de Cncer, por favor vaya directamente al [employer] de Cncer y regstrese en el rea de Engineer, maintenance (IT).   Use ropa cmoda y [place] para tener fcil acceso a las vas del Portacath (acceso venoso de larga duracin) o la lnea PICC (catter central colocado por va perifrica).   Nos esforzamos por ofrecerle tiempo de calidad con su proveedor. Es posible que tenga que volver a programar su cita si llega tarde (15 minutos o ms).  El llegar tarde le afecta a usted y a otros pacientes cuyas citas son posteriores a Armed forces operational officer.  Adems, si usted falta a tres o ms citas sin avisar a la oficina, puede ser retirado(a) de la clnica a discrecin del proveedor.      Para las solicitudes de renovacin de recetas, pida a su farmacia que se ponga en contacto con nuestra oficina y deje que transcurran 72 horas para que se complete el proceso de las renovaciones.    Hoy usted recibi los siguientes agentes de quimioterapia e/o inmunoterapia: atezolizumab  and bevacizumab       Para ayudar a prevenir las nuseas y los vmitos despus de su tratamiento, le recomendamos que tome su medicamento para las nuseas segn las indicaciones.  LOS SNTOMAS QUE DEBEN COMUNICARSE INMEDIATAMENTE SE INDICAN A CONTINUACIN: *FIEBRE SUPERIOR A 100.4 F (38 C) O MS *ESCALOFROS O SUDORACIN *NUSEAS Y VMITOS QUE NO SE CONTROLAN CON EL MEDICAMENTO PARA LAS NUSEAS *DIFICULTAD INUSUAL PARA RESPIRAR  *MORETONES O HEMORRAGIAS NO HABITUALES *PROBLEMAS URINARIOS (dolor o ardor al Geographical information systems officer o frecuencia para Geographical information systems officer) *PROBLEMAS INTESTINALES (diarrea inusual, estreimiento, dolor cerca del ano) SENSIBILIDAD EN LA BOCA Y EN LA GARGANTA CON O SIN LA PRESENCIA DE LCERAS (dolor de garganta, llagas en la boca o dolor de  muelas/dientes) ERUPCIN, HINCHAZN O DOLORES INUSUALES FLUJO VAGINAL INUSUAL O PICAZN/RASQUIA    Los puntos marcados con un asterisco ( *) indican una posible emergencia y debe hacer un seguimiento tan pronto como le sea posible o vaya al Departamento de Emergencias si se le presenta algn problema.  Por favor, muestre la [place] DE ADVERTENCIA DE Christina Gutierrez DE ADVERTENCIA DE Christina Gutierrez al registrarse en [address] de Emergencias y a la enfermera de triaje.  Si tiene preguntas despus de su visita o necesita cancelar o volver a programar su cita, por favor pngase en contacto con [facility] - A DEPT OF [facility]  Dept: [phone] y siga las instrucciones. Las horas de oficina son de 8:00 a.m. a 4:30 p.m. de lunes a viernes. Por favor, tenga en cuenta que los mensajes de voz que se dejan despus de las 4:00 p.m. posiblemente no se devolvern hasta el siguiente da de trabajo.  Cerramos los fines de semana y [employer]. En todo momento tiene acceso a una enfermera para preguntas urgentes. Por favor, llame al nmero principal de la clnica Dept: [phone] y siga las instrucciones.   Para cualquier pregunta que no sea de carcter urgente, tambin puede ponerse en contacto con su proveedor utilizando MyChart. Ahora ofrecemos visitas electrnicas para cualquier persona mayor de 18 aos que solicite atencin mdica en lnea para los sntomas que no sean urgentes. Para ms detalles vaya a [email or web address].   Tambin puede bajar la aplicacin de MyChart! Vaya  a la tienda de aplicaciones, busque MyChart, abra la aplicacin, seleccione [place], e ingrese con su nombre de usuario y la contrasea de [name].

## 2023-10-24 LAB — T4: T4, Total: 11.9 ug/dL (ref 4.5–12.0)

## 2023-10-28 NOTE — Addendum Note (Signed)
 Encounter addended by: Karolynn Pack on: 10/28/2023 3:17 PM  Actions taken: Imaging Exam ended

## 2023-11-13 ENCOUNTER — Inpatient Hospital Stay: Payer: Self-pay | Attending: Oncology

## 2023-11-13 ENCOUNTER — Encounter: Payer: Self-pay | Admitting: Oncology

## 2023-11-13 ENCOUNTER — Other Ambulatory Visit: Payer: Self-pay

## 2023-11-13 ENCOUNTER — Inpatient Hospital Stay: Payer: Self-pay

## 2023-11-13 ENCOUNTER — Inpatient Hospital Stay (HOSPITAL_BASED_OUTPATIENT_CLINIC_OR_DEPARTMENT_OTHER): Payer: Self-pay | Admitting: Oncology

## 2023-11-13 ENCOUNTER — Telehealth: Payer: Self-pay

## 2023-11-13 VITALS — BP 109/65 | HR 73 | Temp 97.7°F | Resp 17 | Wt 114.7 lb

## 2023-11-13 VITALS — BP 109/67 | HR 82 | Resp 18

## 2023-11-13 DIAGNOSIS — F5109 Other insomnia not due to a substance or known physiological condition: Secondary | ICD-10-CM

## 2023-11-13 DIAGNOSIS — C220 Liver cell carcinoma: Secondary | ICD-10-CM

## 2023-11-13 DIAGNOSIS — D649 Anemia, unspecified: Secondary | ICD-10-CM | POA: Insufficient documentation

## 2023-11-13 DIAGNOSIS — Z79624 Long term (current) use of inhibitors of nucleotide synthesis: Secondary | ICD-10-CM | POA: Insufficient documentation

## 2023-11-13 DIAGNOSIS — G893 Neoplasm related pain (acute) (chronic): Secondary | ICD-10-CM

## 2023-11-13 DIAGNOSIS — Z95828 Presence of other vascular implants and grafts: Secondary | ICD-10-CM

## 2023-11-13 DIAGNOSIS — Z5112 Encounter for antineoplastic immunotherapy: Secondary | ICD-10-CM | POA: Insufficient documentation

## 2023-11-13 DIAGNOSIS — K068 Other specified disorders of gingiva and edentulous alveolar ridge: Secondary | ICD-10-CM | POA: Insufficient documentation

## 2023-11-13 DIAGNOSIS — B181 Chronic viral hepatitis B without delta-agent: Secondary | ICD-10-CM

## 2023-11-13 DIAGNOSIS — Z79899 Other long term (current) drug therapy: Secondary | ICD-10-CM | POA: Insufficient documentation

## 2023-11-13 LAB — CMP (CANCER CENTER ONLY)
ALT: 99 U/L — ABNORMAL HIGH (ref 0–44)
AST: 191 U/L (ref 15–41)
Albumin: 3.9 g/dL (ref 3.5–5.0)
Alkaline Phosphatase: 109 U/L (ref 38–126)
Anion gap: 7 (ref 5–15)
BUN: 8 mg/dL (ref 6–20)
CO2: 24 mmol/L (ref 22–32)
Calcium: 9.8 mg/dL (ref 8.9–10.3)
Chloride: 104 mmol/L (ref 98–111)
Creatinine: 0.52 mg/dL (ref 0.44–1.00)
GFR, Estimated: >60 mL/min (ref >60)
Glucose, Bld: 88 mg/dL (ref 70–99)
Potassium: 4.2 mmol/L (ref 3.5–5.1)
Sodium: 135 mmol/L (ref 135–145)
Total Bilirubin: 0.9 mg/dL (ref 0.0–1.2)
Total Protein: 9.1 g/dL — ABNORMAL HIGH (ref 6.5–8.1)

## 2023-11-13 LAB — CBC WITH DIFFERENTIAL (CANCER CENTER ONLY)
Abs Immature Granulocytes: 0.01 10*3/uL (ref 0.00–0.07)
Basophils Absolute: 0.2 10*3/uL — ABNORMAL HIGH (ref 0.0–0.1)
Basophils Relative: 3 %
Eosinophils Absolute: 0.5 10*3/uL (ref 0.0–0.5)
Eosinophils Relative: 9 %
HCT: 26.4 % — ABNORMAL LOW (ref 36.0–46.0)
Hemoglobin: 9.1 g/dL — ABNORMAL LOW (ref 12.0–15.0)
Immature Granulocytes: 0 %
Lymphocytes Relative: 39 %
Lymphs Abs: 2.3 10*3/uL (ref 0.7–4.0)
MCH: 28 pg (ref 26.0–34.0)
MCHC: 34.5 g/dL (ref 30.0–36.0)
MCV: 81.2 fL (ref 80.0–100.0)
Monocytes Absolute: 0.6 10*3/uL (ref 0.1–1.0)
Monocytes Relative: 10 %
Neutro Abs: 2.3 10*3/uL (ref 1.7–7.7)
Neutrophils Relative %: 39 %
Platelet Count: 300 10*3/uL (ref 150–400)
RBC: 3.25 MIL/uL — ABNORMAL LOW (ref 3.87–5.11)
RDW: 21.1 % — ABNORMAL HIGH (ref 11.5–15.5)
WBC Count: 5.9 10*3/uL (ref 4.0–10.5)
nRBC: 0 % (ref 0.0–0.2)

## 2023-11-13 LAB — PREGNANCY, URINE: Preg Test, Ur: NEGATIVE

## 2023-11-13 MED ORDER — SODIUM CHLORIDE 0.9 % IV SOLN
1200.0000 mg | Freq: Once | INTRAVENOUS | Status: AC
  Administered 2023-11-13: 1200 mg via INTRAVENOUS
  Filled 2023-11-13: qty 20

## 2023-11-13 MED ORDER — SODIUM CHLORIDE 0.9 % IV SOLN
15.0000 mg/kg | Freq: Once | INTRAVENOUS | Status: AC
  Administered 2023-11-13: 800 mg via INTRAVENOUS
  Filled 2023-11-13: qty 32

## 2023-11-13 MED ORDER — SODIUM CHLORIDE 0.9% FLUSH
10.0000 mL | Freq: Once | INTRAVENOUS | Status: AC
  Administered 2023-11-13: 10 mL

## 2023-11-13 MED ORDER — ALPRAZOLAM 0.5 MG PO TABS: 0.5000 mg | ORAL_TABLET | Freq: Every evening | ORAL | 0 refills | Status: DC | PRN

## 2023-11-13 MED ORDER — SODIUM CHLORIDE 0.9% FLUSH
10.0000 mL | INTRAVENOUS | Status: DC | PRN
  Administered 2023-11-13: 10 mL

## 2023-11-13 MED ORDER — HEPARIN SOD (PORK) LOCK FLUSH 100 UNIT/ML IV SOLN
500.0000 [IU] | Freq: Once | INTRAVENOUS | Status: AC | PRN
  Administered 2023-11-13: 500 [IU]

## 2023-11-13 MED ORDER — SODIUM CHLORIDE 0.9 % IV SOLN: INTRAVENOUS | Status: DC

## 2023-11-13 NOTE — Progress Notes (Signed)
 Patient seen by Dr. Gale Jude Pasam today  Vitals are within treatment parameters:Yes   Labs are within treatment parameters: No (Please specify and give further instructions.)  AST is 191 Treatment plan has been signed: Yes   Per physician team, Patient is ready for treatment and there are NO modifications to the treatment plan.   AST high at 191. Dr. Randye Buttner aware. Will proceed with tx without changes.

## 2023-11-13 NOTE — Patient Instructions (Signed)
 Instrucciones al darle de alta: Discharge Instructions Gracias por elegir al [facility] de Cncer de [place] para br[place]arle atencin mdica de oncologa y Teacher, English as a foreign language.   Si usted tiene una cita de laboratorio con el Centro de Cncer, por favor vaya directamente al [employer] de Cncer y regstrese en el rea de Engineer, maintenance (IT).   Use ropa cmoda y [place] para tener fcil acceso a las vas del Portacath (acceso venoso de larga duracin) o la lnea PICC (catter central colocado por va perifrica).   Nos esforzamos por ofrecerle tiempo de calidad con su proveedor. Es posible que tenga que volver a programar su cita si llega tarde (15 minutos o ms).  El llegar tarde le afecta a usted y a otros pacientes cuyas citas son posteriores a Armed forces operational officer.  Adems, si usted falta a tres o ms citas sin avisar a la oficina, puede ser retirado(a) de la clnica a discrecin del proveedor.      Para las solicitudes de renovacin de recetas, pida a su farmacia que se ponga en contacto con nuestra oficina y deje que transcurran 72 horas para que se complete el proceso de las renovaciones.    Hoy usted recibi los siguientes agentes de quimioterapia e/o inmunoterapia: Tecentriq , Bevacizumab .       Para ayudar a prevenir las nuseas y los vmitos despus de su tratamiento, le recomendamos que tome su medicamento para las nuseas segn las indicaciones.  LOS SNTOMAS QUE DEBEN COMUNICARSE INMEDIATAMENTE SE INDICAN A CONTINUACIN: *FIEBRE SUPERIOR A 100.4 F (38 C) O MS *ESCALOFROS O SUDORACIN *NUSEAS Y VMITOS QUE NO SE CONTROLAN CON EL MEDICAMENTO PARA LAS NUSEAS *DIFICULTAD INUSUAL PARA RESPIRAR  *MORETONES O HEMORRAGIAS NO HABITUALES *PROBLEMAS URINARIOS (dolor o ardor al Geographical information systems officer o frecuencia para Geographical information systems officer) *PROBLEMAS INTESTINALES (diarrea inusual, estreimiento, dolor cerca del ano) SENSIBILIDAD EN LA BOCA Y EN LA GARGANTA CON O SIN LA PRESENCIA DE LCERAS (dolor de garganta, llagas en la boca o dolor de  muelas/dientes) ERUPCIN, HINCHAZN O DOLORES INUSUALES FLUJO VAGINAL INUSUAL O PICAZN/RASQUIA    Los puntos marcados con un asterisco ( *) indican una posible emergencia y debe hacer un seguimiento tan pronto como le sea posible o vaya al Departamento de Emergencias si se le presenta algn problema.  Por favor, muestre la [place] DE ADVERTENCIA DE Georgie Kiss DE ADVERTENCIA DE Annie Barton al registrarse en [address] de Emergencias y a la enfermera de triaje.  Si tiene preguntas despus de su visita o necesita cancelar o volver a programar su cita, por favor pngase en contacto con [facility] - A DEPT OF [facility]  Dept: [phone] y siga las instrucciones. Las horas de oficina son de 8:00 a.m. a 4:30 p.m. de lunes a viernes. Por favor, tenga en cuenta que los mensajes de voz que se dejan despus de las 4:00 p.m. posiblemente no se devolvern hasta el siguiente da de trabajo.  Cerramos los fines de semana y [employer]. En todo momento tiene acceso a una enfermera para preguntas urgentes. Por favor, llame al nmero principal de la clnica Dept: [phone] y siga las instrucciones.   Para cualquier pregunta que no sea de carcter urgente, tambin puede ponerse en contacto con su proveedor utilizando MyChart. Ahora ofrecemos visitas electrnicas para cualquier persona mayor de 18 aos que solicite atencin mdica en lnea para los sntomas que no sean urgentes. Para ms detalles vaya a [email or web address].   Tambin puede bajar la aplicacin de MyChart! Vaya  a la tienda de aplicaciones, busque "MyChart", abra la aplicacin, seleccione [place], e ingrese con su nombre de usuario y la contrasea de [name].

## 2023-11-13 NOTE — Telephone Encounter (Signed)
 Critical lab value reported: AST 191  Dr. Randye Buttner & Elvia Hammans, RN

## 2023-11-13 NOTE — Assessment & Plan Note (Signed)
 Since we would like to avoid Tylenol containing products, she was prescribed oxycodone previously.  However it caused her to have dizziness and nausea.  She will requested for a milder regimen.  Today I sent a prescription for tramadol 50 mg to be taken every 6 hours as needed for pain.  Will assess tolerance and adjust dose as needed.

## 2023-11-13 NOTE — Assessment & Plan Note (Addendum)
 Reports daily gingival bleeding, even when drinking water. This is a new symptom with no prior history.  No obvious concerns noted on examination.  Referral to a dentist is planned for evaluation. - Refer to a dentist for evaluation of gingival bleeding.

## 2023-11-13 NOTE — Progress Notes (Signed)
 [facility]  ONCOLOGY CLINIC PROGRESS NOTE   Patient Care Team: Arlo Berber, MD as PCP - General (Oncology)  PATIENT NAME: Christina Gutierrez   MR#: [id number] DOB: 1975-10-08  Date of visit: 11/13/2023   ASSESSMENT & PLAN:   Beckham Mackellar is a 48 y.o. Hispanic lady with no significant past medical history was hospitalized on 08/21/2023 after she presented with worsening right-sided abdominal pain, nausea, vomiting, decreased oral intake.  Workup including MRI of the abdomen showed evidence of multifocal hepatocellular carcinoma.  She was diagnosed with hepatitis B infection during this hospital admission.   Hepatocellular carcinoma (HCC) Please review oncology history for additional details and timeline of events.  MRI of the abdomen with liver protocol on 08/21/2023 showed numerous hepatic lesions are noted, with a dominant lesion which replaces much of the right lobe of the liver estimated to measure approximately 16.5 x 12.7 x 17.7 cm. This lesion is heterogeneous in signal intensity on T1 and T2 weighted images, but clearly has internal areas of hypervascular enhancement on early phase post gadolinium imaging, with persistent low-level enhancement on more delayed imaging. The other dominant lesion is centered in the superior aspect of the liver, likely within the superior aspect of segment 4A estimated to measure approximately 8.1 x 6.8 x 7.8 cm, with similar imaging characteristics to the previously described lesion, although with greater degree of internal washout and probable pseudo capsule on delayed imaging. Multiple other smaller hypervascular lesions are apparent on arterial phase imaging throughout all aspects of the liver, some of which demonstrate delayed washout.   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  This was confirmed during GI tumor conference on 09/09/2023.  -Previously undiagnosed hepatitis B could have been  the contributing factor. -Not a candidate for liver transplant or surgical resection because of the extent of disease. -Discussed diagnosis, prognosis, plan of care, treatment options.  Reviewed NCCN guidelines.   On 08/21/2023, AFP was significantly elevated at 32,160 ng/mL.     CT chest on 08/23/2023 showed no evidence of intrathoracic metastatic disease.  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  Plan made for systemic treatments with atezolizumab  plus bevacizumab .  Started this from 09/11/2023.  She has been tolerating treatments well without any major side effects.  Labs today reveal hemoglobin of 9.1, stable compared to prior.  Normal white count and platelet count.  AST 191, slightly better, ALT stable at 99.  Total bilirubin is normal at 0.9.  No dose-limiting toxicities.  Will proceed with cycle 4 of atezolizumab  plus bevacizumab  today.   RTC in 3 weeks for labs, follow-up, cycle 5 of treatment.   Restaging MRI of the liver will be obtained approximately 3 months from the time of treatment initiation.  Request placed today.  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   She is currently on tenofovir .  Follows up with Dr. Ernie Heal at ID clinic.  - Monitor liver function tests with each visit and ammonia levels as indicated  Cancer associated pain Since we would like to avoid Tylenol  containing products, she was prescribed oxycodone  previously.  However it caused her to have dizziness and nausea.  She will requested for a milder regimen.  Today I sent a prescription for tramadol  50 mg to be taken every 6 hours as needed for pain.  Will assess tolerance and adjust dose as needed.  Situational insomnia Patient has been having trouble falling asleep, but  Xanax  seems to help.  Prescription will be refilled.  Gingival bleeding Reports daily gingival bleeding, even when drinking water. This is a new symptom with no  prior history.  No obvious concerns noted on examination.  Referral to a dentist is planned for evaluation. - Refer to a dentist for evaluation of gingival bleeding.   I reviewed lab results and outside records for this visit and discussed relevant results with the patient. Diagnosis, plan of care and treatment options were also discussed in detail with the patient. Opportunity provided to ask questions and answers provided to her apparent satisfaction. Provided instructions to call our clinic with any problems, questions or concerns prior to return visit. I recommended to continue follow-up with PCP and sub-specialists. She verbalized understanding and agreed with the plan.   NCCN guidelines have been consulted in the planning of this patient's care.  I spent a total of 30 minutes during this encounter with the patient including review of chart and various tests results, discussions about plan of care and coordination of care plan.  Arlo Berber, MD  11/13/2023 12:10 PM  [facility] - A DEPT OF [facility] 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  Current Treatment: Systemic treatment with atezolizumab  plus bevacizumab  starting from 09/11/2023.  INTERVAL HISTORY:    Discussed the use of AI scribe software for clinical note transcription with the patient, who gave verbal consent to proceed.  History of Present Illness  She has experienced a weight loss of six pounds since February, with three pounds lost since March. She consumes one Ensure protein drink daily but has not been focusing on protein intake otherwise. Her appetite is described as okay. No nausea, vomiting, or pain.  She has been experiencing gum bleeding for about a month, which occurs even when drinking water or  while sleeping. This is a new symptom, and she notices blood on her teeth in the morning.  She describes discomfort in her feet, particularly in the lower legs, which feels like muscle tiredness or cramping, similar to lifting weights. No tingling, numbness, or swelling in the feet.  She has a history of hepatitis B and is currently taking tenofovir  daily for this condition. Her liver numbers have been fluctuating.  She experiences a rash that appears and disappears, often related to heat exposure. She denies being out in the sun frequently.  Her hemoglobin level has been 9.1. No current bleeding or blood in stools.    I have reviewed the past medical history, past surgical history, social history and family history with the patient and they are unchanged from previous note.  HISTORY OF PRESENT ILLNESS:   ONCOLOGY HISTORY:   48 y.o. Hispanic lady with no significant past medical history, presented to the emergency department on 08/21/2023 with complaints of right-sided abdominal pain, nausea, vomiting and decreased oral intake for at least 10 days prior to arrival.   On arrival to ED, she was found to have anemia with hemoglobin of 8, white count normal at 6100, platelet count 316,000.  CMP showed elevated AST of 80, ALT increased at 79, bilirubin elevated at 1.8.  Elevated ammonia level of 62.   Right upper quadrant ultrasound showed large heterogenoussolid mass within the liver measuring up to 16.9 cm, suspicious for malignancy. Recommend further evaluation with contrast-enhanced  CT or MRI.   CT abdomen pelvis showed: 15.5 x 12.5 cm solid well-circumscribed mass within the right hepatic lobe. Areas of central low-density suggest possibility of central scar. There may be a large feeding vessel. Favor focal nodular hyperplasia although fibrolamellar HCC can have a similar appearance. Recommend further evaluation MRI without and with contrast.   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 No evidence for cirrhosis. This may be related to mass effect and compression of the portal vein from the large mass.   She was recently diagnosed with chickenpox about 20 days prior to this hospitalization.  Still had some itching from vesicles.   She was admitted for further evaluation and management.   MRI of the abdomen with liver protocol on 08/21/2023 showed numerous hepatic lesions are noted, with a dominant lesion which replaces much of the right lobe of the liver estimated to measure approximately 16.5 x 12.7 x 17.7 cm. This lesion is heterogeneous in signal intensity on T1 and T2 weighted images, but clearly has internal areas of hypervascular enhancement on early phase post gadolinium imaging, with persistent low-level enhancement on more delayed imaging. The other dominant lesion is centered in the superior aspect of the liver, likely within the superior aspect of segment 4A estimated to measure approximately 8.1 x 6.8 x 7.8 cm, with similar imaging characteristics to the previously described lesion, although with greater degree of internal washout and probable pseudo capsule on delayed imaging. Multiple other smaller hypervascular lesions are apparent on arterial phase imaging throughout all aspects of the liver, some of which demonstrate delayed washout.   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   On 08/21/2023, AFP was significantly elevated at 32,160 ng/mL.     She was also diagnosed with hepatitis B infection on additional workup.   CT chest on 08/23/2023 showed no evidence of intrathoracic metastatic disease.   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  Her case was discussed in GI tumor conference on 09/09/2023.  Reviewed images with the team and confirmed multifocal hepatocellular carcinoma.   Plan made for systemic treatments with atezolizumab  plus bevacizumab .  Started from 09/11/2023.  Oncology History  Hepatocellular carcinoma (HCC)   08/22/2023 Initial Diagnosis   Hepatocellular carcinoma (HCC)   08/27/2023 Cancer Staging   Staging form: Liver, AJCC 8th Edition - Clinical: Stage IIIA (cT3, cN0, cM0) - Signed by Arlo Berber, MD on 08/27/2023   09/11/2023 -  Chemotherapy   Patient is on Treatment Plan : HEPATOCELLULAR Atezolizumab  + Bevacizumab  q21d         REVIEW OF SYSTEMS:   Review of Systems - Oncology  All other pertinent systems were reviewed with the patient and are negative.  ALLERGIES: She has no known allergies.  MEDICATIONS:  Current Outpatient Medications  Medication Sig Dispense Refill   lidocaine -prilocaine  (EMLA ) cream Apply 1 Application topically as needed. 30 g 3   ondansetron  (ZOFRAN ) 4 MG tablet Take 1 tablet (4 mg total) by mouth daily as needed for nausea or vomiting. 30 tablet 1   pantoprazole  (PROTONIX ) 40 MG tablet Take 1 tablet (40 mg total) by mouth daily at 6 (six) AM. 30 tablet 1   prochlorperazine  (COMPAZINE ) 10 MG tablet TAKE 1 TABLET(10 MG) BY MOUTH EVERY 6 HOURS AS NEEDED FOR NAUSEA OR VOMITING 30 tablet 1   tenofovir  alafenamide (VEMLIDY ) 25 MG tablet Take 1 tablet (25 mg total) by mouth daily. 30 tablet 11   traMADol  (ULTRAM ) 50 MG tablet Take 1 tablet (50  mg total) by mouth every 6 (six) hours as needed for severe pain (pain score 7-10) or moderate pain (pain score 4-6). 50 tablet 0   ALPRAZolam  (XANAX ) 0.5 MG tablet Take 1 tablet (0.5 mg total) by mouth at bedtime as needed for anxiety. 30 tablet 0   No current facility-administered medications for this visit.   Facility-Administered Medications Ordered in Other Visits  Medication Dose Route Frequency Provider Last Rate Last Admin   0.9 %  sodium chloride  infusion   Intravenous Continuous Jasmyne Lodato, MD   Stopped at 11/13/23 1120   sodium chloride  flush (NS) 0.9 % injection 10 mL  10 mL Intracatheter PRN Jasiah Buntin, MD   10 mL at 11/13/23 1117     VITALS:   Blood pressure 109/65, pulse 73, temperature 97.7 F  (36.5 C), temperature source Temporal, resp. rate 17, weight 114 lb 11.2 oz (52 kg), SpO2 99%.  Wt Readings from Last 3 Encounters:  11/13/23 114 lb 11.2 oz (52 kg)  10/23/23 118 lb (53.5 kg)  10/02/23 117 lb 4.8 oz (53.2 kg)    Body mass index is 22.4 kg/m.    Onc Performance Status - 11/13/23 0844       ECOG Perf Status   ECOG Perf Status Restricted in physically strenuous activity but ambulatory and able to carry out work of a light or sedentary nature, e.g., light house work, office work      KPS SCALE   KPS % SCORE Cares for self, unable to carry on normal activity or to do active work             PHYSICAL EXAM:   Physical Exam Constitutional:      General: She is not in acute distress.    Appearance: Normal appearance.  HENT:     Head: Normocephalic and atraumatic.  Eyes:     General: No scleral icterus.    Conjunctiva/sclera: Conjunctivae normal.  Cardiovascular:     Rate and Rhythm: Normal rate and regular rhythm.     Heart sounds: Normal heart sounds.  Pulmonary:     Effort: Pulmonary effort is normal.     Breath sounds: Normal breath sounds.  Abdominal:     Palpations: There is mass (hepatomegaly).     Tenderness: There is abdominal tenderness (in RUQ area).  Musculoskeletal:     Right lower leg: No edema.     Left lower leg: No edema.  Neurological:     General: No focal deficit present.     Mental Status: She is alert.  Psychiatric:        Mood and Affect: Mood normal.        Behavior: Behavior normal.      LABORATORY DATA:   I have reviewed the data as listed.  Results for orders placed or performed in visit on 11/13/23  CMP (Cancer Center only)  Result Value Ref Range   Sodium 135 135 - 145 mmol/L   Potassium 4.2 3.5 - 5.1 mmol/L   Chloride 104 98 - 111 mmol/L   CO2 24 22 - 32 mmol/L   Glucose, Bld 88 70 - 99 mg/dL   BUN 8 6 - 20 mg/dL   Creatinine 1.61 0.96 - 1.00 mg/dL   Calcium 9.8 8.9 - 04.5 mg/dL   Total Protein 9.1 (H) 6.5 -  8.1 g/dL   Albumin 3.9 3.5 - 5.0 g/dL   AST 409 (HH) 15 - 41 U/L   ALT 99 (H) 0 - 44 U/L  Alkaline Phosphatase 109 38 - 126 U/L   Total Bilirubin 0.9 0.0 - 1.2 mg/dL   GFR, Estimated >16 >10 mL/min   Anion gap 7 5 - 15  CBC with Differential (Cancer Center Only)  Result Value Ref Range   WBC Count 5.9 4.0 - 10.5 K/uL   RBC 3.25 (L) 3.87 - 5.11 MIL/uL   Hemoglobin 9.1 (L) 12.0 - 15.0 g/dL   HCT 96.0 (L) 45.4 - 09.8 %   MCV 81.2 80.0 - 100.0 fL   MCH 28.0 26.0 - 34.0 pg   MCHC 34.5 30.0 - 36.0 g/dL   RDW 11.9 (H) 14.7 - 82.9 %   Platelet Count 300 150 - 400 K/uL   nRBC 0.0 0.0 - 0.2 %   Neutrophils Relative % 39 %   Neutro Abs 2.3 1.7 - 7.7 K/uL   Lymphocytes Relative 39 %   Lymphs Abs 2.3 0.7 - 4.0 K/uL   Monocytes Relative 10 %   Monocytes Absolute 0.6 0.1 - 1.0 K/uL   Eosinophils Relative 9 %   Eosinophils Absolute 0.5 0.0 - 0.5 K/uL   Basophils Relative 3 %   Basophils Absolute 0.2 (H) 0.0 - 0.1 K/uL   Immature Granulocytes 0 %   Abs Immature Granulocytes 0.01 0.00 - 0.07 K/uL  Pregnancy, urine  Result Value Ref Range   Preg Test, Ur NEGATIVE NEGATIVE     RADIOGRAPHIC STUDIES:  I have personally reviewed the radiological images as listed and agree with the findings in the report.  No results found.   CODE STATUS:  Code Status History     Date Active Date Inactive Code Status Order ID Comments User Context   08/29/2023 0409 08/31/2023 1925 Full Code [id number]  Bary Boss, DO ED   08/21/2023 0400 08/23/2023 2127 Full Code [id number]  Jetty Mort, MD ED    Questions for Most Recent Historical Code Status (Order [id number])     Question Answer   By: Consent: discussion documented in EHR            Orders Placed This Encounter  Procedures   MR LIVER W WO CONTRAST    Standing Status:   Future    Expected Date:   12/14/2023    Expiration Date:   11/12/2024    If indicated for the ordered procedure, I authorize the administration of contrast media per  Radiology protocol:   Yes    What is the patient's sedation requirement?:   Anti-anxiety    Does the patient have a pacemaker or implanted devices?:   No    Preferred imaging location?:   [facility] (table limit - 550 lbs)   AFP tumor marker    Standing Status:   Future    Expected Date:   01/15/2024    Expiration Date:   01/14/2025   CBC with Differential (Cancer Center Only)    Standing Status:   Future    Expected Date:   01/15/2024    Expiration Date:   01/14/2025   CMP (Cancer Center only)    Standing Status:   Future    Expected Date:   01/15/2024    Expiration Date:   01/14/2025   T4    Standing Status:   Future    Expected Date:   01/15/2024    Expiration Date:   01/14/2025   TSH    Standing Status:   Future    Expected Date:   01/15/2024    Expiration Date:  01/14/2025   Total Protein, Urine dipstick    Standing Status:   Future    Expected Date:   01/15/2024    Expiration Date:   01/14/2025   Ambulatory referral to Dentistry    Referral Priority:   Routine    Referral Type:   Consultation    Referral Reason:   Specialty Services Required    Requested Specialty:   Dental General Practice    Number of Visits Requested:   1     Future Appointments  Date Time Provider Department Center  12/04/2023  8:15 AM [facility] FLUSH [facility] None  12/04/2023  8:45 AM Donnald Tabar, MD [facility] None  12/04/2023  9:45 AM [facility] INFUSION [facility] None  12/25/2023 10:00 AM [facility] FLUSH [facility] None  12/25/2023 10:30 AM Berlyn Saylor, MD [facility] None  12/25/2023 11:30 AM [facility] INFUSION [facility] None     This document was completed utilizing speech recognition software. Grammatical errors, random word insertions, pronoun errors, and incomplete sentences are an occasional consequence of this system due to software limitations, ambient noise, and hardware issues. Any formal questions or concerns about the content, text or information contained within  the body of this dictation should be directly addressed to the provider for clarification.

## 2023-11-13 NOTE — Assessment & Plan Note (Signed)
 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   She is currently on tenofovir.  Follows up with Dr. Daiva Eves at ID clinic.  - Monitor liver function tests with each visit and ammonia levels as indicated

## 2023-11-13 NOTE — Assessment & Plan Note (Signed)
 Patient has been having trouble falling asleep, but Xanax  seems to help.  Prescription will be refilled.

## 2023-11-13 NOTE — Assessment & Plan Note (Addendum)
 Please review oncology history for additional details and timeline of events.  MRI of the abdomen with liver protocol on 08/21/2023 showed numerous hepatic lesions are noted, with a dominant lesion which replaces much of the right lobe of the liver estimated to measure approximately 16.5 x 12.7 x 17.7 cm. This lesion is heterogeneous in signal intensity on T1 and T2 weighted images, but clearly has internal areas of hypervascular enhancement on early phase post gadolinium imaging, with persistent low-level enhancement on more delayed imaging. The other dominant lesion is centered in the superior aspect of the liver, likely within the superior aspect of segment 4A estimated to measure approximately 8.1 x 6.8 x 7.8 cm, with similar imaging characteristics to the previously described lesion, although with greater degree of internal washout and probable pseudo capsule on delayed imaging. Multiple other smaller hypervascular lesions are apparent on arterial phase imaging throughout all aspects of the liver, some of which demonstrate delayed washout.   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  This was confirmed during GI tumor conference on 09/09/2023.  -Previously undiagnosed hepatitis B could have been the contributing factor. -Not a candidate for liver transplant or surgical resection because of the extent of disease. -Discussed diagnosis, prognosis, plan of care, treatment options.  Reviewed NCCN guidelines.   On 08/21/2023, AFP was significantly elevated at 32,160 ng/mL.     CT chest on 08/23/2023 showed no evidence of intrathoracic metastatic disease.  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  Plan made for systemic treatments with atezolizumab  plus bevacizumab .  Started this from 09/11/2023.  She has been tolerating treatments well without any major side effects.  Labs today reveal hemoglobin of 9.1, stable compared to prior.  Normal white count and platelet count.  AST 191, slightly  better, ALT stable at 99.  Total bilirubin is normal at 0.9.  No dose-limiting toxicities.  Will proceed with cycle 4 of atezolizumab  plus bevacizumab  today.   RTC in 3 weeks for labs, follow-up, cycle 5 of treatment.   Restaging MRI of the liver will be obtained approximately 3 months from the time of treatment initiation.  Request placed today.

## 2023-11-14 LAB — AFP TUMOR MARKER: AFP, Serum, Tumor Marker: 47514 ng/mL — ABNORMAL HIGH (ref 0.0–6.4)

## 2023-11-16 ENCOUNTER — Other Ambulatory Visit: Payer: Self-pay

## 2023-11-19 ENCOUNTER — Other Ambulatory Visit: Payer: Self-pay | Admitting: Pharmacy Technician

## 2023-11-19 ENCOUNTER — Other Ambulatory Visit: Payer: Self-pay

## 2023-11-19 NOTE — Progress Notes (Signed)
 Specialty Pharmacy Refill Coordination Note  Christina Gutierrez is a 48 y.o. female contacted today regarding refills of specialty medication(s) Tenofovir  Alafenamide Fumarate (VEMLIDY )   Patient requested Pickup at [facility] [place] date: 11/20/23   Medication will be filled on 11/19/23.  Spoke to patient's spouse.

## 2023-11-20 ENCOUNTER — Other Ambulatory Visit: Payer: Self-pay

## 2023-11-21 ENCOUNTER — Other Ambulatory Visit (HOSPITAL_COMMUNITY): Payer: Self-pay

## 2023-12-04 ENCOUNTER — Other Ambulatory Visit: Payer: Self-pay

## 2023-12-04 ENCOUNTER — Encounter: Payer: Self-pay | Admitting: Oncology

## 2023-12-04 ENCOUNTER — Inpatient Hospital Stay: Payer: Self-pay

## 2023-12-04 ENCOUNTER — Inpatient Hospital Stay: Payer: Self-pay | Admitting: Dietician

## 2023-12-04 ENCOUNTER — Inpatient Hospital Stay: Payer: Self-pay | Admitting: Oncology

## 2023-12-04 VITALS — BP 112/80 | HR 76 | Temp 98.4°F | Resp 16 | Ht 60.0 in | Wt 115.5 lb

## 2023-12-04 DIAGNOSIS — F5109 Other insomnia not due to a substance or known physiological condition: Secondary | ICD-10-CM

## 2023-12-04 DIAGNOSIS — C220 Liver cell carcinoma: Secondary | ICD-10-CM

## 2023-12-04 DIAGNOSIS — B181 Chronic viral hepatitis B without delta-agent: Secondary | ICD-10-CM

## 2023-12-04 DIAGNOSIS — G893 Neoplasm related pain (acute) (chronic): Secondary | ICD-10-CM

## 2023-12-04 LAB — CBC WITH DIFFERENTIAL (CANCER CENTER ONLY)
Abs Immature Granulocytes: 0.01 10*3/uL (ref 0.00–0.07)
Basophils Absolute: 0.1 10*3/uL (ref 0.0–0.1)
Basophils Relative: 2 %
Eosinophils Absolute: 0.5 10*3/uL (ref 0.0–0.5)
Eosinophils Relative: 10 %
HCT: 25.4 % — ABNORMAL LOW (ref 36.0–46.0)
Hemoglobin: 8.6 g/dL — ABNORMAL LOW (ref 12.0–15.0)
Immature Granulocytes: 0 %
Lymphocytes Relative: 35 %
Lymphs Abs: 1.8 10*3/uL (ref 0.7–4.0)
MCH: 28.2 pg (ref 26.0–34.0)
MCHC: 33.9 g/dL (ref 30.0–36.0)
MCV: 83.3 fL (ref 80.0–100.0)
Monocytes Absolute: 0.5 10*3/uL (ref 0.1–1.0)
Monocytes Relative: 10 %
Neutro Abs: 2.2 10*3/uL (ref 1.7–7.7)
Neutrophils Relative %: 43 %
Platelet Count: 282 10*3/uL (ref 150–400)
RBC: 3.05 MIL/uL — ABNORMAL LOW (ref 3.87–5.11)
RDW: 22.5 % — ABNORMAL HIGH (ref 11.5–15.5)
WBC Count: 5.2 10*3/uL (ref 4.0–10.5)
nRBC: 0 % (ref 0.0–0.2)

## 2023-12-04 LAB — CMP (CANCER CENTER ONLY)
ALT: 114 U/L — ABNORMAL HIGH (ref 0–44)
AST: 204 U/L (ref 15–41)
Albumin: 4 g/dL (ref 3.5–5.0)
Alkaline Phosphatase: 103 U/L (ref 38–126)
Anion gap: 7 (ref 5–15)
BUN: 9 mg/dL (ref 6–20)
CO2: 25 mmol/L (ref 22–32)
Calcium: 9.7 mg/dL (ref 8.9–10.3)
Chloride: 99 mmol/L (ref 98–111)
Creatinine: 0.63 mg/dL (ref 0.44–1.00)
GFR, Estimated: >60 mL/min (ref >60)
Glucose, Bld: 86 mg/dL (ref 70–99)
Potassium: 3.9 mmol/L (ref 3.5–5.1)
Sodium: 131 mmol/L — ABNORMAL LOW (ref 135–145)
Total Bilirubin: 1 mg/dL (ref 0.0–1.2)
Total Protein: 9.1 g/dL — ABNORMAL HIGH (ref 6.5–8.1)

## 2023-12-04 LAB — PREGNANCY, URINE: Preg Test, Ur: NEGATIVE

## 2023-12-04 MED ORDER — SODIUM CHLORIDE 0.9 % IV SOLN
15.0000 mg/kg | Freq: Once | INTRAVENOUS | Status: AC
  Administered 2023-12-04: 800 mg via INTRAVENOUS
  Filled 2023-12-04: qty 32

## 2023-12-04 MED ORDER — SODIUM CHLORIDE 0.9 % IV SOLN: INTRAVENOUS | Status: DC

## 2023-12-04 MED ORDER — SODIUM CHLORIDE 0.9 % IV SOLN
1200.0000 mg | Freq: Once | INTRAVENOUS | Status: AC
  Administered 2023-12-04: 1200 mg via INTRAVENOUS
  Filled 2023-12-04: qty 20

## 2023-12-04 MED ORDER — ALPRAZOLAM 0.5 MG PO TABS: 0.5000 mg | ORAL_TABLET | Freq: Every evening | ORAL | 0 refills | Status: DC | PRN

## 2023-12-04 NOTE — Patient Instructions (Signed)
 Instrucciones al darle de alta: Discharge Instructions Gracias por elegir al [facility] de Cncer de [place] para br[place]arle atencin mdica de oncologa y Teacher, English as a foreign language.   Si usted tiene una cita de laboratorio con el Centro de Cncer, por favor vaya directamente al [employer] de Cncer y regstrese en el rea de Engineer, maintenance (IT).   Use ropa cmoda y [place] para tener fcil acceso a las vas del Portacath (acceso venoso de larga duracin) o la lnea PICC (catter central colocado por va perifrica).   Nos esforzamos por ofrecerle tiempo de calidad con su proveedor. Es posible que tenga que volver a programar su cita si llega tarde (15 minutos o ms).  El llegar tarde le afecta a usted y a otros pacientes cuyas citas son posteriores a Armed forces operational officer.  Adems, si usted falta a tres o ms citas sin avisar a la oficina, puede ser retirado(a) de la clnica a discrecin del proveedor.      Para las solicitudes de renovacin de recetas, pida a su farmacia que se ponga en contacto con nuestra oficina y deje que transcurran 72 horas para que se complete el proceso de las renovaciones.    Hoy usted recibi los siguientes agentes de quimioterapia e/o inmunoterapia: Tecentriq , Bevacizumab .       Para ayudar a prevenir las nuseas y los vmitos despus de su tratamiento, le recomendamos que tome su medicamento para las nuseas segn las indicaciones.  LOS SNTOMAS QUE DEBEN COMUNICARSE INMEDIATAMENTE SE INDICAN A CONTINUACIN: *FIEBRE SUPERIOR A 100.4 F (38 C) O MS *ESCALOFROS O SUDORACIN *NUSEAS Y VMITOS QUE NO SE CONTROLAN CON EL MEDICAMENTO PARA LAS NUSEAS *DIFICULTAD INUSUAL PARA RESPIRAR  *MORETONES O HEMORRAGIAS NO HABITUALES *PROBLEMAS URINARIOS (dolor o ardor al Geographical information systems officer o frecuencia para Geographical information systems officer) *PROBLEMAS INTESTINALES (diarrea inusual, estreimiento, dolor cerca del ano) SENSIBILIDAD EN LA BOCA Y EN LA GARGANTA CON O SIN LA PRESENCIA DE LCERAS (dolor de garganta, llagas en la boca o dolor de  muelas/dientes) ERUPCIN, HINCHAZN O DOLORES INUSUALES FLUJO VAGINAL INUSUAL O PICAZN/RASQUIA    Los puntos marcados con un asterisco ( *) indican una posible emergencia y debe hacer un seguimiento tan pronto como le sea posible o vaya al Departamento de Emergencias si se le presenta algn problema.  Por favor, muestre la [place] DE ADVERTENCIA DE Georgie Kiss DE ADVERTENCIA DE Annie Barton al registrarse en [address] de Emergencias y a la enfermera de triaje.  Si tiene preguntas despus de su visita o necesita cancelar o volver a programar su cita, por favor pngase en contacto con [facility] - A DEPT OF [facility]  Dept: [phone] y siga las instrucciones. Las horas de oficina son de 8:00 a.m. a 4:30 p.m. de lunes a viernes. Por favor, tenga en cuenta que los mensajes de voz que se dejan despus de las 4:00 p.m. posiblemente no se devolvern hasta el siguiente da de trabajo.  Cerramos los fines de semana y [employer]. En todo momento tiene acceso a una enfermera para preguntas urgentes. Por favor, llame al nmero principal de la clnica Dept: [phone] y siga las instrucciones.   Para cualquier pregunta que no sea de carcter urgente, tambin puede ponerse en contacto con su proveedor utilizando MyChart. Ahora ofrecemos visitas electrnicas para cualquier persona mayor de 18 aos que solicite atencin mdica en lnea para los sntomas que no sean urgentes. Para ms detalles vaya a [email or web address].   Tambin puede bajar la aplicacin de MyChart! Vaya  a la tienda de aplicaciones, busque "MyChart", abra la aplicacin, seleccione [place], e ingrese con su nombre de usuario y la contrasea de [name].

## 2023-12-04 NOTE — Progress Notes (Signed)
 Nutrition Assessment   Reason for Assessment: +MST   ASSESSMENT: 48 year old female with stage III HCC. She is receiving tecentriq  + avastin  (start 3/7). Patient is under the care of Dr. Randye Buttner  Past medical history of HEP B currently under treatment with tenofovir   Met with patient and husband in infusion. Spanish interpretor (Julie) present at visit. Patient reports having a good appetite and eating well. Reports 2 good meals and snacks. She has a little something with morning pill which makes her sleepy. After a rest, she eats breakfast ~11 AM (eggs, grits, beets, avocado, potatoes). Patient snacks on fruits and drinks fruit/vegetable smoothies prepared at home. She is drinking one Ensure Plus daily and likes chocolate flavor. Patient recalls usually drinking 2 bottles of water along with juices. She endorses constipation despite daily miralax . Patient reports small BM yesterday.   Nutrition Focused Physical Exam: deferred   Medications: xanax , zofran , protonix , compazine , vemlidy , tramadol    Labs: Na 131, AST 204, ALT 114   Anthropometrics:   Height: 5 Weight: 115 lb 8 oz  UBW: 150 lb (Jan 2024) BMI: 22.56   NUTRITION DIAGNOSIS: Food and nutrition related knowledge deficit related to cancer as evidenced by no prior need for associated nutrition information    INTERVENTION:  Educated on importance of adequate calorie/protein energy intake to maintain weights/strength Educated on foods with protein - recommend including protein source at every meal (offered suggestions, handout with snack ideas in spanish provided) Continue drinking Ensure Plus/equivalent - samples + coupons provided Discussed strategies for constipation, encourage walking after meals, increasing intake of water, suggested increasing miralax  2x/day   MONITORING, EVALUATION, GOAL: Pt will tolerate adequate calories and protein to minimize wt loss    Next Visit: To be scheduled as needed

## 2023-12-04 NOTE — Assessment & Plan Note (Signed)
 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   She is currently on tenofovir.  Follows up with Dr. Daiva Eves at ID clinic.  - Monitor liver function tests with each visit and ammonia levels as indicated

## 2023-12-04 NOTE — Progress Notes (Signed)
 CRITICAL VALUE STICKER  CRITICAL VALUE:  AST: 204  RECEIVER (on-site recipient of call): Elvia Hammans, RN  DATE & TIME NOTIFIED:   12/04/2023, 0901  MESSENGER (representative from lab):  Melissa at [phone]  MD NOTIFIED:  Dr. Randye Buttner, Yes  TIME OF NOTIFICATION: 0902 in person  RESPONSE:  Aware

## 2023-12-04 NOTE — Progress Notes (Signed)
 Patient seen by Dr. Gale Jude Pasam today  Vitals are within treatment parameters:Yes   Labs are within treatment parameters: No (Please specify and give further instructions.)   AST 204 ALT  114  Treatment plan has been signed: Yes   Per physician team, Patient is ready for treatment and there are NO modifications to the treatment plan. Dr. Randye Buttner aware of AST 204 ALT  114 and will proceed with Tx today.

## 2023-12-04 NOTE — Assessment & Plan Note (Addendum)
 Please review oncology history for additional details and timeline of events.  MRI of the abdomen with liver protocol on 08/21/2023 showed numerous hepatic lesions are noted, with a dominant lesion which replaces much of the right lobe of the liver estimated to measure approximately 16.5 x 12.7 x 17.7 cm. This lesion is heterogeneous in signal intensity on T1 and T2 weighted images, but clearly has internal areas of hypervascular enhancement on early phase post gadolinium imaging, with persistent low-level enhancement on more delayed imaging. The other dominant lesion is centered in the superior aspect of the liver, likely within the superior aspect of segment 4A estimated to measure approximately 8.1 x 6.8 x 7.8 cm, with similar imaging characteristics to the previously described lesion, although with greater degree of internal washout and probable pseudo capsule on delayed imaging. Multiple other smaller hypervascular lesions are apparent on arterial phase imaging throughout all aspects of the liver, some of which demonstrate delayed washout.   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  This was confirmed during GI tumor conference on 09/09/2023.  -Previously undiagnosed hepatitis B could have been the contributing factor. -Not a candidate for liver transplant or surgical resection because of the extent of disease. -Discussed diagnosis, prognosis, plan of care, treatment options.  Reviewed NCCN guidelines.   On 08/21/2023, AFP was significantly elevated at 32,160 ng/mL.     CT chest on 08/23/2023 showed no evidence of intrathoracic metastatic disease.  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  Plan made for systemic treatments with atezolizumab  plus bevacizumab .  Started this from 09/11/2023.  She has been tolerating treatments well without any major side effects.  Labs today reveal hemoglobin of 8.6, stable compared to prior.  Normal white count and platelet count.  AST 204, ALT 114,  overall stable.  Total bilirubin is normal at 1.0.  No dose-limiting toxicities.  Will proceed with cycle 5 of atezolizumab  plus bevacizumab  today.   Restaging MRI of the abdomen is scheduled on 12/14/2023.  Will discuss results on return visit.  RTC in 3 weeks for labs, follow-up, cycle 6 of treatment.

## 2023-12-04 NOTE — Assessment & Plan Note (Addendum)
 Since we would like to avoid Tylenol  containing products, she was prescribed oxycodone  previously.  However it caused her to have dizziness and nausea.  She will requested for a milder regimen.  On last visit I sent a prescription for tramadol  50 mg to be taken every 6 hours as needed for pain.  Will assess tolerance and adjust dose as needed.

## 2023-12-04 NOTE — Progress Notes (Signed)
 [facility]  ONCOLOGY CLINIC PROGRESS NOTE   Patient Care Team: Arlo Berber, MD as PCP - General (Oncology)  PATIENT NAME: Christina Gutierrez   MR#: [id number] DOB: 21-Aug-1975  Date of visit: 12/04/2023   ASSESSMENT & PLAN:   Christina Gutierrez is a 48 y.o. Hispanic lady with no significant past medical history was hospitalized on 08/21/2023 after she presented with worsening right-sided abdominal pain, nausea, vomiting, decreased oral intake.  Workup including MRI of the abdomen showed evidence of multifocal hepatocellular carcinoma.  She was diagnosed with hepatitis B infection during this hospital admission.   Hepatocellular carcinoma (HCC) Please review oncology history for additional details and timeline of events.  MRI of the abdomen with liver protocol on 08/21/2023 showed numerous hepatic lesions are noted, with a dominant lesion which replaces much of the right lobe of the liver estimated to measure approximately 16.5 x 12.7 x 17.7 cm. This lesion is heterogeneous in signal intensity on T1 and T2 weighted images, but clearly has internal areas of hypervascular enhancement on early phase post gadolinium imaging, with persistent low-level enhancement on more delayed imaging. The other dominant lesion is centered in the superior aspect of the liver, likely within the superior aspect of segment 4A estimated to measure approximately 8.1 x 6.8 x 7.8 cm, with similar imaging characteristics to the previously described lesion, although with greater degree of internal washout and probable pseudo capsule on delayed imaging. Multiple other smaller hypervascular lesions are apparent on arterial phase imaging throughout all aspects of the liver, some of which demonstrate delayed washout.   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  This was confirmed during GI tumor conference on 09/09/2023.  -Previously undiagnosed hepatitis B could have  been the contributing factor. -Not a candidate for liver transplant or surgical resection because of the extent of disease. -Discussed diagnosis, prognosis, plan of care, treatment options.  Reviewed NCCN guidelines.   On 08/21/2023, AFP was significantly elevated at 32,160 ng/mL.     CT chest on 08/23/2023 showed no evidence of intrathoracic metastatic disease.  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  Plan made for systemic treatments with atezolizumab  plus bevacizumab .  Started this from 09/11/2023.  She has been tolerating treatments well without any major side effects.  Labs today reveal hemoglobin of 8.6, stable compared to prior.  Normal white count and platelet count.  AST 204, ALT 114, overall stable.  Total bilirubin is normal at 1.0.  No dose-limiting toxicities.  Will proceed with cycle 5 of atezolizumab  plus bevacizumab  today.   Restaging MRI of the abdomen is scheduled on 12/14/2023.  Will discuss results on return visit.  RTC in 3 weeks for labs, follow-up, cycle 6 of treatment.   Cancer associated pain Since we would like to avoid Tylenol  containing products, she was prescribed oxycodone  previously.  However it caused her to have dizziness and nausea.  She will requested for a milder regimen.  On last visit I sent a prescription for tramadol  50 mg to be taken every 6 hours as needed for pain.  Will assess tolerance and adjust dose as needed.  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   She is currently on tenofovir .  Follows up with Dr. Ernie Heal at [facility] clinic.  - Monitor liver function tests with each visit and ammonia levels as indicated  I reviewed lab results and outside records for this visit and discussed relevant results with  the patient. Diagnosis, plan of care and treatment options were also discussed in detail with the patient. Opportunity provided to ask questions and answers provided to her  apparent satisfaction. Provided instructions to call our clinic with any problems, questions or concerns prior to return visit. I recommended to continue follow-up with PCP and sub-specialists. She verbalized understanding and agreed with the plan.   NCCN guidelines have been consulted in the planning of this patient's care.  I spent a total of 30 minutes during this encounter with the patient including review of chart and various tests results, discussions about plan of care and coordination of care plan.  Arlo Berber, MD  12/04/2023 9:22 AM  [facility] - A DEPT OF [facility] [address] [place] [address] Dept: [phone] Dept Fax: [phone]    CHIEF COMPLAINT/ REASON FOR VISIT:   Multifocal hepatocellular carcinoma, diagnosed based on MRI findings.  Current Treatment: Systemic treatment with atezolizumab  plus bevacizumab  starting from 09/11/2023.  INTERVAL HISTORY:    Discussed the use of AI scribe software for clinical note transcription with the patient, who gave verbal consent to proceed.  History of Present Illness  Christina Gutierrez is a 48 year old female with liver disease who presents for follow-up of her treatment.  She has been experiencing intermittent abdominal pain, which resolved after occurring yesterday. She tolerates her treatment well without nausea or vomiting. Her appetite remains good, and she has been eating well.  No bleeding problems, including no blood in stools or melena. She has been using a mouthwash for an infection, which has improved her symptoms.  Her hemoglobin levels have been fluctuating, with the current level at 8.6.    I have reviewed the past medical history, past surgical history, social history and family history with the patient and they are unchanged from previous note.  HISTORY OF PRESENT ILLNESS:   ONCOLOGY HISTORY:   48 y.o. Hispanic lady  with no significant past medical history, presented to the emergency department on 08/21/2023 with complaints of right-sided abdominal pain, nausea, vomiting and decreased oral intake for at least 10 days prior to arrival.   On arrival to ED, she was found to have anemia with hemoglobin of 8, white count normal at 6100, platelet count 316,000.  CMP showed elevated AST of 80, ALT increased at 79, bilirubin elevated at 1.8.  Elevated ammonia level of 62.   Right upper quadrant ultrasound showed large heterogenoussolid mass within the liver measuring up to 16.9 cm, suspicious for malignancy. Recommend further evaluation with contrast-enhanced CT or MRI.   CT abdomen pelvis showed: 15.5 x 12.5 cm solid well-circumscribed mass within the right hepatic lobe. Areas of central low-density suggest possibility of central scar. There may be a large feeding vessel. Favor focal nodular hyperplasia although fibrolamellar HCC can have a similar appearance. Recommend further evaluation MRI without and with contrast.   Left spontaneous splenorenal shunt suggest portal venous hypertension. No evidence for cirrhosis. This may be related to mass effect and compression of the portal vein from the large mass.   She was recently diagnosed with chickenpox about 20 days prior to this hospitalization.  Still had some itching from vesicles.   She was admitted for further evaluation and management.   MRI of the abdomen with liver protocol on 08/21/2023 showed numerous hepatic lesions are noted, with a dominant lesion which replaces much of the right lobe of the liver estimated  to measure approximately 16.5 x 12.7 x 17.7 cm. This lesion is heterogeneous in signal intensity on T1 and T2 weighted images, but clearly has internal areas of hypervascular enhancement on early phase post gadolinium imaging, with persistent low-level enhancement on more delayed imaging. The other dominant lesion is centered in the superior aspect of the  liver, likely within the superior aspect of segment 4A estimated to measure approximately 8.1 x 6.8 x 7.8 cm, with similar imaging characteristics to the previously described lesion, although with greater degree of internal washout and probable pseudo capsule on delayed imaging. Multiple other smaller hypervascular lesions are apparent on arterial phase imaging throughout all aspects of the liver, some of which demonstrate delayed washout.   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   On 08/21/2023, AFP was significantly elevated at 32,160 ng/mL.     She was also diagnosed with hepatitis B infection on additional workup.   CT chest on 08/23/2023 showed no evidence of intrathoracic metastatic disease.   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  Her case was discussed in GI tumor conference on 09/09/2023.  Reviewed images with the team and confirmed multifocal hepatocellular carcinoma.   Plan made for systemic treatments with atezolizumab  plus bevacizumab .  Started from 09/11/2023.  Oncology History  Hepatocellular carcinoma (HCC)  08/22/2023 Initial Diagnosis   Hepatocellular carcinoma (HCC)   08/27/2023 Cancer Staging   Staging form: Liver, AJCC 8th Edition - Clinical: Stage IIIA (cT3, cN0, cM0) - Signed by Arlo Berber, MD on 08/27/2023   09/11/2023 -  Chemotherapy   Patient is on Treatment Plan : HEPATOCELLULAR Atezolizumab  + Bevacizumab  q21d         REVIEW OF SYSTEMS:   Review of Systems - Oncology  All other pertinent systems were reviewed with the patient and are negative.  ALLERGIES: She has no known allergies.  MEDICATIONS:  Current Outpatient Medications  Medication Sig Dispense Refill   lidocaine -prilocaine  (EMLA ) cream Apply 1 Application topically as needed. 30 g 3   ondansetron  (ZOFRAN ) 4 MG tablet Take 1 tablet (4 mg total) by mouth daily as needed for nausea or vomiting. 30 tablet 1   prochlorperazine  (COMPAZINE ) 10 MG tablet TAKE 1 TABLET(10 MG) BY  MOUTH EVERY 6 HOURS AS NEEDED FOR NAUSEA OR VOMITING 30 tablet 1   tenofovir  alafenamide (VEMLIDY ) 25 MG tablet Take 1 tablet (25 mg total) by mouth daily. 30 tablet 11   traMADol  (ULTRAM ) 50 MG tablet Take 1 tablet (50 mg total) by mouth every 6 (six) hours as needed for severe pain (pain score 7-10) or moderate pain (pain score 4-6). 50 tablet 0   ALPRAZolam  (XANAX ) 0.5 MG tablet Take 1 tablet (0.5 mg total) by mouth at bedtime as needed for anxiety. 30 tablet 0   pantoprazole  (PROTONIX ) 40 MG tablet Take 1 tablet (40 mg total) by mouth daily at 6 (six) AM. 30 tablet 1   No current facility-administered medications for this visit.   Facility-Administered Medications Ordered in Other Visits  Medication Dose Route Frequency Provider Last Rate Last Admin   0.9 %  sodium chloride  infusion   Intravenous Continuous Neylan Koroma, MD       atezolizumab  (TECENTRIQ ) 1,200 mg in sodium chloride  0.9 % 250 mL chemo infusion  1,200 mg Intravenous Once Belita Warsame, MD       bevacizumab  (AVASTIN ) 800 mg in sodium chloride  0.9 % 100 mL chemo infusion  15 mg/kg (Treatment Plan Recorded) Intravenous Once Pami Wool, MD  VITALS:   Blood pressure 112/80, pulse 76, temperature 98.4 F (36.9 C), temperature source Temporal, resp. rate 16, height 5' (1.524 m), weight 115 lb 8 oz (52.4 kg), SpO2 100%.  Wt Readings from Last 3 Encounters:  12/04/23 115 lb 8 oz (52.4 kg)  11/13/23 114 lb 11.2 oz (52 kg)  10/23/23 118 lb (53.5 kg)    Body mass index is 22.56 kg/m.    Onc Performance Status - 12/04/23 0856       ECOG Perf Status   ECOG Perf Status Restricted in physically strenuous activity but ambulatory and able to carry out work of a light or sedentary nature, e.g., light house work, office work      KPS SCALE   KPS % SCORE Normal activity with effort, some s/s of disease              PHYSICAL EXAM:   Physical Exam Constitutional:      General: She is not in acute distress.     Appearance: Normal appearance.  HENT:     Head: Normocephalic and atraumatic.  Eyes:     General: No scleral icterus.    Conjunctiva/sclera: Conjunctivae normal.  Cardiovascular:     Rate and Rhythm: Normal rate and regular rhythm.     Heart sounds: Normal heart sounds.  Pulmonary:     Effort: Pulmonary effort is normal.     Breath sounds: Normal breath sounds.  Abdominal:     Palpations: There is mass (stable hepatomegaly).  Musculoskeletal:     Right lower leg: No edema.     Left lower leg: No edema.  Neurological:     General: No focal deficit present.     Mental Status: She is alert.  Psychiatric:        Mood and Affect: Mood normal.        Behavior: Behavior normal.      LABORATORY DATA:   I have reviewed the data as listed.  Results for orders placed or performed in visit on 12/04/23  CBC with Differential (Cancer Center Only)  Result Value Ref Range   WBC Count 5.2 4.0 - 10.5 K/uL   RBC 3.05 (L) 3.87 - 5.11 MIL/uL   Hemoglobin 8.6 (L) 12.0 - 15.0 g/dL   HCT 84.6 (L) 96.2 - 95.2 %   MCV 83.3 80.0 - 100.0 fL   MCH 28.2 26.0 - 34.0 pg   MCHC 33.9 30.0 - 36.0 g/dL   RDW 84.1 (H) 32.4 - 40.1 %   Platelet Count 282 150 - 400 K/uL   nRBC 0.0 0.0 - 0.2 %   Neutrophils Relative % 43 %   Neutro Abs 2.2 1.7 - 7.7 K/uL   Lymphocytes Relative 35 %   Lymphs Abs 1.8 0.7 - 4.0 K/uL   Monocytes Relative 10 %   Monocytes Absolute 0.5 0.1 - 1.0 K/uL   Eosinophils Relative 10 %   Eosinophils Absolute 0.5 0.0 - 0.5 K/uL   Basophils Relative 2 %   Basophils Absolute 0.1 0.0 - 0.1 K/uL   Immature Granulocytes 0 %   Abs Immature Granulocytes 0.01 0.00 - 0.07 K/uL  CMP (Cancer Center only)  Result Value Ref Range   Sodium 131 (L) 135 - 145 mmol/L   Potassium 3.9 3.5 - 5.1 mmol/L   Chloride 99 98 - 111 mmol/L   CO2 25 22 - 32 mmol/L   Glucose, Bld 86 70 - 99 mg/dL   BUN 9 6 - 20 mg/dL  Creatinine 0.63 0.44 - 1.00 mg/dL   Calcium 9.7 8.9 - 11.9 mg/dL   Total Protein  9.1 (H) 6.5 - 8.1 g/dL   Albumin 4.0 3.5 - 5.0 g/dL   AST 147 (HH) 15 - 41 U/L   ALT 114 (H) 0 - 44 U/L   Alkaline Phosphatase 103 38 - 126 U/L   Total Bilirubin 1.0 0.0 - 1.2 mg/dL   GFR, Estimated >82 >95 mL/min   Anion gap 7 5 - 15  Pregnancy, urine  Result Value Ref Range   Preg Test, Ur NEGATIVE NEGATIVE     RADIOGRAPHIC STUDIES:  I have personally reviewed the radiological images as listed and agree with the findings in the report.  No results found.   CODE STATUS:  Code Status History     Date Active Date Inactive Code Status Order ID Comments User Context   08/29/2023 0409 08/31/2023 1925 Full Code [id number]  Bary Boss, DO ED   08/21/2023 0400 08/23/2023 2127 Full Code [id number]  Jetty Mort, MD ED    Questions for Most Recent Historical Code Status (Order [id number])     Question Answer   By: Consent: discussion documented in EHR            Orders Placed This Encounter  Procedures   CBC with Differential (Cancer Center Only)    Standing Status:   Future    Expected Date:   02/05/2024    Expiration Date:   02/04/2025   CMP (Cancer Center only)    Standing Status:   Future    Expected Date:   02/05/2024    Expiration Date:   02/04/2025   T4    Standing Status:   Future    Expected Date:   02/05/2024    Expiration Date:   02/04/2025   TSH    Standing Status:   Future    Expected Date:   02/05/2024    Expiration Date:   02/04/2025   Total Protein, Urine dipstick    Standing Status:   Future    Expected Date:   02/05/2024    Expiration Date:   02/04/2025     Future Appointments  Date Time Provider Department Center  12/04/2023  9:45 AM [facility] INFUSION [facility] None  12/04/2023 10:30 AM Woody Heading, RD [facility] None  12/14/2023  8:00 AM [facility] 1 [facility] [place]  12/25/2023 10:00 AM [facility] FLUSH [facility] None  12/25/2023 10:30 AM Keithon Mccoin, MD [facility] None  12/25/2023 11:30 AM [facility] INFUSION [facility] None     This  document was completed utilizing speech recognition software. Grammatical errors, random word insertions, pronoun errors, and incomplete sentences are an occasional consequence of this system due to software limitations, ambient noise, and hardware issues. Any formal questions or concerns about the content, text or information contained within the body of this dictation should be directly addressed to the provider for clarification.

## 2023-12-11 ENCOUNTER — Other Ambulatory Visit: Payer: Self-pay

## 2023-12-14 ENCOUNTER — Ambulatory Visit (HOSPITAL_COMMUNITY)
Admission: RE | Admit: 2023-12-14 | Discharge: 2023-12-14 | Disposition: A | Discharge status: Home / Self Care | Payer: Self-pay | Source: Ambulatory Visit | Attending: Oncology | Admitting: Oncology

## 2023-12-14 DIAGNOSIS — C220 Liver cell carcinoma: Secondary | ICD-10-CM | POA: Insufficient documentation

## 2023-12-14 MED ORDER — GADOBUTROL 1 MMOL/ML IV SOLN
5.0000 mL | Freq: Once | INTRAVENOUS | Status: AC | PRN
  Administered 2023-12-14: 5 mL via INTRAVENOUS

## 2023-12-14 MED ORDER — HEPARIN SOD (PORK) LOCK FLUSH 100 UNIT/ML IV SOLN
500.0000 [IU] | Freq: Once | INTRAVENOUS | Status: AC
  Administered 2023-12-14: 500 [IU] via INTRAVENOUS

## 2023-12-16 ENCOUNTER — Other Ambulatory Visit: Payer: Self-pay

## 2023-12-22 ENCOUNTER — Other Ambulatory Visit (HOSPITAL_COMMUNITY): Payer: Self-pay

## 2023-12-25 ENCOUNTER — Inpatient Hospital Stay (HOSPITAL_BASED_OUTPATIENT_CLINIC_OR_DEPARTMENT_OTHER): Payer: Self-pay | Admitting: Oncology

## 2023-12-25 ENCOUNTER — Encounter: Payer: Self-pay | Admitting: Oncology

## 2023-12-25 ENCOUNTER — Inpatient Hospital Stay: Payer: Self-pay | Attending: Oncology

## 2023-12-25 ENCOUNTER — Inpatient Hospital Stay: Payer: Self-pay

## 2023-12-25 ENCOUNTER — Other Ambulatory Visit: Payer: Self-pay

## 2023-12-25 ENCOUNTER — Other Ambulatory Visit (HOSPITAL_COMMUNITY): Payer: Self-pay

## 2023-12-25 VITALS — BP 114/67 | HR 70 | Temp 98.0°F | Resp 16 | Ht 60.0 in | Wt 115.5 lb

## 2023-12-25 DIAGNOSIS — Z95828 Presence of other vascular implants and grafts: Secondary | ICD-10-CM

## 2023-12-25 DIAGNOSIS — C220 Liver cell carcinoma: Secondary | ICD-10-CM | POA: Insufficient documentation

## 2023-12-25 DIAGNOSIS — Z79624 Long term (current) use of inhibitors of nucleotide synthesis: Secondary | ICD-10-CM | POA: Insufficient documentation

## 2023-12-25 DIAGNOSIS — B181 Chronic viral hepatitis B without delta-agent: Secondary | ICD-10-CM

## 2023-12-25 DIAGNOSIS — Z5112 Encounter for antineoplastic immunotherapy: Secondary | ICD-10-CM | POA: Insufficient documentation

## 2023-12-25 DIAGNOSIS — K068 Other specified disorders of gingiva and edentulous alveolar ridge: Secondary | ICD-10-CM

## 2023-12-25 DIAGNOSIS — Z79899 Other long term (current) drug therapy: Secondary | ICD-10-CM | POA: Insufficient documentation

## 2023-12-25 LAB — CMP (CANCER CENTER ONLY)
ALT: 106 U/L — ABNORMAL HIGH (ref 0–44)
AST: 176 U/L (ref 15–41)
Albumin: 4 g/dL (ref 3.5–5.0)
Alkaline Phosphatase: 84 U/L (ref 38–126)
Anion gap: 7 (ref 5–15)
BUN: 7 mg/dL (ref 6–20)
CO2: 26 mmol/L (ref 22–32)
Calcium: 9.9 mg/dL (ref 8.9–10.3)
Chloride: 104 mmol/L (ref 98–111)
Creatinine: 0.55 mg/dL (ref 0.44–1.00)
GFR, Estimated: >60 mL/min (ref >60)
Glucose, Bld: 86 mg/dL (ref 70–99)
Potassium: 4 mmol/L (ref 3.5–5.1)
Sodium: 137 mmol/L (ref 135–145)
Total Bilirubin: 0.8 mg/dL (ref 0.0–1.2)
Total Protein: 8.9 g/dL — ABNORMAL HIGH (ref 6.5–8.1)

## 2023-12-25 LAB — CBC WITH DIFFERENTIAL (CANCER CENTER ONLY)
Abs Immature Granulocytes: 0.01 10*3/uL (ref 0.00–0.07)
Basophils Absolute: 0.1 10*3/uL (ref 0.0–0.1)
Basophils Relative: 2 %
Eosinophils Absolute: 0.6 10*3/uL — ABNORMAL HIGH (ref 0.0–0.5)
Eosinophils Relative: 11 %
HCT: 25.6 % — ABNORMAL LOW (ref 36.0–46.0)
Hemoglobin: 8.6 g/dL — ABNORMAL LOW (ref 12.0–15.0)
Immature Granulocytes: 0 %
Lymphocytes Relative: 35 %
Lymphs Abs: 1.9 10*3/uL (ref 0.7–4.0)
MCH: 28.1 pg (ref 26.0–34.0)
MCHC: 33.6 g/dL (ref 30.0–36.0)
MCV: 83.7 fL (ref 80.0–100.0)
Monocytes Absolute: 0.5 10*3/uL (ref 0.1–1.0)
Monocytes Relative: 10 %
Neutro Abs: 2.3 10*3/uL (ref 1.7–7.7)
Neutrophils Relative %: 42 %
Platelet Count: 295 10*3/uL (ref 150–400)
RBC: 3.06 MIL/uL — ABNORMAL LOW (ref 3.87–5.11)
RDW: 20.6 % — ABNORMAL HIGH (ref 11.5–15.5)
WBC Count: 5.4 10*3/uL (ref 4.0–10.5)
nRBC: 0 % (ref 0.0–0.2)

## 2023-12-25 LAB — FIBRINOGEN: Fibrinogen: 402 mg/dL (ref 210–475)

## 2023-12-25 LAB — PROTIME-INR
INR: 1.5 — ABNORMAL HIGH (ref 0.8–1.2)
Prothrombin Time: 18.3 s — ABNORMAL HIGH (ref 11.4–15.2)

## 2023-12-25 LAB — TSH: TSH: 4.54 u[IU]/mL — ABNORMAL HIGH (ref 0.350–4.500)

## 2023-12-25 LAB — APTT: aPTT: 37 s — ABNORMAL HIGH (ref 24–36)

## 2023-12-25 LAB — TOTAL PROTEIN, URINE DIPSTICK: Protein, ur: NEGATIVE mg/dL

## 2023-12-25 MED ORDER — SODIUM CHLORIDE 0.9 % IV SOLN
INTRAVENOUS | Status: DC
Start: 2023-12-25 — End: 2023-12-25

## 2023-12-25 MED ORDER — PHYTONADIONE 5 MG PO TABS
10.0000 mg | ORAL_TABLET | Freq: Every day | ORAL | 0 refills | Status: DC
  Filled 2023-12-25 – 2023-12-28 (×2): qty 6, 3d supply, fill #0

## 2023-12-25 MED ORDER — SODIUM CHLORIDE 0.9% FLUSH
10.0000 mL | Freq: Once | INTRAVENOUS | Status: AC
  Administered 2023-12-25: 10 mL

## 2023-12-25 MED ORDER — SODIUM CHLORIDE 0.9 % IV SOLN
15.0000 mg/kg | Freq: Once | INTRAVENOUS | Status: AC
  Administered 2023-12-25: 800 mg via INTRAVENOUS
  Filled 2023-12-25: qty 32

## 2023-12-25 MED ORDER — PANTOPRAZOLE SODIUM 40 MG PO TBEC
40.0000 mg | DELAYED_RELEASE_TABLET | Freq: Every day | ORAL | 1 refills | Status: DC
  Filled 2023-12-25: qty 30, 30d supply, fill #0

## 2023-12-25 MED ORDER — SODIUM CHLORIDE 0.9 % IV SOLN
1200.0000 mg | Freq: Once | INTRAVENOUS | Status: AC
  Administered 2023-12-25: 1200 mg via INTRAVENOUS
  Filled 2023-12-25: qty 20

## 2023-12-25 NOTE — Progress Notes (Signed)
 [facility]  ONCOLOGY CLINIC PROGRESS NOTE   Patient Care Team: Arlo Berber, MD as PCP - General (Oncology)  PATIENT NAME: Christina Gutierrez   MR#: [id number] DOB: 1975/11/29  Date of visit: 12/25/2023   ASSESSMENT & PLAN:   Christina Gutierrez is a 48 y.o. Hispanic lady with no significant past medical history was hospitalized on 08/21/2023 after she presented with worsening right-sided abdominal pain, nausea, vomiting, decreased oral intake.  Workup including MRI of the abdomen showed evidence of multifocal hepatocellular carcinoma.  She was diagnosed with hepatitis B infection during this hospital admission.   Hepatocellular carcinoma (HCC) Please review oncology history for additional details and timeline of events.  MRI of the abdomen with liver protocol on 08/21/2023 showed numerous hepatic lesions are noted, with a dominant lesion which replaces much of the right lobe of the liver estimated to measure approximately 16.5 x 12.7 x 17.7 cm. This lesion is heterogeneous in signal intensity on T1 and T2 weighted images, but clearly has internal areas of hypervascular enhancement on early phase post gadolinium imaging, with persistent low-level enhancement on more delayed imaging. The other dominant lesion is centered in the superior aspect of the liver, likely within the superior aspect of segment 4A estimated to measure approximately 8.1 x 6.8 x 7.8 cm, with similar imaging characteristics to the previously described lesion, although with greater degree of internal washout and probable pseudo capsule on delayed imaging. Multiple other smaller hypervascular lesions are apparent on arterial phase imaging throughout all aspects of the liver, some of which demonstrate delayed washout.   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  This was confirmed during GI tumor conference on 09/09/2023.  -Previously undiagnosed hepatitis B could have  been the contributing factor. -Not a candidate for liver transplant or surgical resection because of the extent of disease. -Discussed diagnosis, prognosis, plan of care, treatment options.  Reviewed NCCN guidelines.   On 08/21/2023, AFP was significantly elevated at 32,160 ng/mL.     CT chest on 08/23/2023 showed no evidence of intrathoracic metastatic disease.  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  Plan made for systemic treatments with atezolizumab  plus bevacizumab .  Started this from 09/11/2023.  She has been tolerating treatments well without any major side effects.  Restaging MRI of the liver on 12/14/2023 showed stable disease without evidence of disease progression or new disease.  Plan to continue current management.  Labs today reveal no dose-limiting toxicities.  Persistent elevated LFTs but improved compared to prior.  No dose-limiting toxicities.  Will proceed with cycle 6 of atezolizumab  plus bevacizumab  today.   RTC in 3 weeks for labs, follow-up, cycle 7 of treatment.   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   She is currently on tenofovir .  Follows up with Dr. Ernie Heal at ID clinic.  - Monitor liver function tests with each visit and ammonia levels as indicated  Gingival bleeding Reports daily gingival bleeding, even when drinking water. This is a new symptom with no prior history.  No obvious concerns noted on examination.    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  INR elevated at 1.5.  Will give her a trial of vitamin K 10 mg daily for 3 days to see if it would help with her symptoms.  Previously we sent her to dentist for further evaluation.  Appointment pending.   I reviewed lab results and outside records for this visit and  discussed relevant results with the patient. Diagnosis, plan of care and treatment options were also discussed in detail with the patient. Opportunity provided to  ask questions and answers provided to her apparent satisfaction. Provided instructions to call our clinic with any problems, questions or concerns prior to return visit. I recommended to continue follow-up with PCP and sub-specialists. She verbalized understanding and agreed with the plan.   NCCN guidelines have been consulted in the planning of this patient's care.  I spent a total of 30 minutes during this encounter with the patient including review of chart and various tests results, discussions about plan of care and coordination of care plan.  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  Current Treatment: Systemic treatment with atezolizumab  plus bevacizumab  starting from 09/11/2023.  INTERVAL HISTORY:    Discussed the use of AI scribe software for clinical note transcription with the patient, who gave verbal consent to proceed.  History of Present Illness  Christina Gutierrez is a 48 year old female with liver cancer who presents with increased bleeding.  She has been experiencing increased bleeding episodes for the past two months, primarily occurring at night while asleep, and occasionally during the day while talking. She manages the bleeding with gauze. There is a concern that the bleeding may be related to liver cancer affecting blood clotting.  She is currently out of lidocaine  cream, which she uses only during clinic visits. She mentioned being out of levothyroxine, although she does not have thyroid  problems. Additionally, she is out of a medication taken every morning at 6 AM, likely Protonix , used for stomach issues.  A recent scan for her liver showed stable disease with no growth  of previous tumors and no new lesions. Her AST liver enzyme level has improved from 200 to 176.  She experiences a rash on her chest that sometimes itches, for which she uses cortisone cream that provides relief.  She has not needed any pain medication recently and reports no current pain. She has a prescription for pain medication but has not been taking it.  She is on Vemlidine or tenofovir  for hepatitis B, with eight refills available.    I have reviewed the past medical history, past surgical history, social history and family history with the patient and they are unchanged from previous note.  HISTORY OF PRESENT ILLNESS:   ONCOLOGY HISTORY:   48 y.o. Hispanic lady with no significant past medical history, presented to the emergency department on 08/21/2023 with complaints of right-sided abdominal pain, nausea, vomiting and decreased oral intake for at least 10 days prior to arrival.   On arrival to ED, she was found to have anemia with hemoglobin of 8, white count normal at 6100, platelet count 316,000.  CMP showed elevated AST of 80, ALT increased at 79, bilirubin elevated at 1.8.  Elevated ammonia level of 62.   Right upper quadrant ultrasound showed large heterogenoussolid mass within the liver measuring up to 16.9 cm, suspicious for malignancy. Recommend further evaluation with contrast-enhanced CT or MRI.   CT abdomen pelvis showed: 15.5 x 12.5 cm solid well-circumscribed mass within the right hepatic lobe. Areas of central low-density suggest possibility of central scar. There may be  a large feeding vessel. Favor focal nodular hyperplasia although fibrolamellar HCC can have a similar appearance. Recommend further evaluation MRI without and with contrast.   Left spontaneous splenorenal shunt suggest portal venous hypertension. No evidence for cirrhosis. This may be related to mass effect and compression of the portal vein from the large mass.   She was recently diagnosed with  chickenpox about 20 days prior to this hospitalization.  Still had some itching from vesicles.   She was admitted for further evaluation and management.   MRI of the abdomen with liver protocol on 08/21/2023 showed numerous hepatic lesions are noted, with a dominant lesion which replaces much of the right lobe of the liver estimated to measure approximately 16.5 x 12.7 x 17.7 cm. This lesion is heterogeneous in signal intensity on T1 and T2 weighted images, but clearly has internal areas of hypervascular enhancement on early phase post gadolinium imaging, with persistent low-level enhancement on more delayed imaging. The other dominant lesion is centered in the superior aspect of the liver, likely within the superior aspect of segment 4A estimated to measure approximately 8.1 x 6.8 x 7.8 cm, with similar imaging characteristics to the previously described lesion, although with greater degree of internal washout and probable pseudo capsule on delayed imaging. Multiple other smaller hypervascular lesions are apparent on arterial phase imaging throughout all aspects of the liver, some of which demonstrate delayed washout.   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   On 08/21/2023, AFP was significantly elevated at 32,160 ng/mL.     She was also diagnosed with hepatitis B infection on additional workup.   CT chest on 08/23/2023 showed no evidence of intrathoracic metastatic disease.   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  Her case was discussed in GI tumor conference on 09/09/2023.  Reviewed images with the team and confirmed multifocal hepatocellular carcinoma.   Plan made for systemic treatments with atezolizumab  plus bevacizumab .  Started from 09/11/2023.  Restaging MRI of the liver on 12/14/2023 showed stable disease without evidence of disease progression or new disease.  Plan to continue current management.  Oncology History  Hepatocellular carcinoma (HCC)  08/22/2023 Initial  Diagnosis   Hepatocellular carcinoma (HCC)   08/27/2023 Cancer Staging   Staging form: Liver, AJCC 8th Edition - Clinical: Stage IIIA (cT3, cN0, cM0) - Signed by Arlo Berber, MD on 08/27/2023   09/11/2023 -  Chemotherapy   Patient is on Treatment Plan : HEPATOCELLULAR Atezolizumab  + Bevacizumab  q21d         REVIEW OF SYSTEMS:   Review of Systems - Oncology  All other pertinent systems were reviewed with the patient and are negative.  ALLERGIES: She has no known allergies.  MEDICATIONS:  Current Outpatient Medications  Medication Sig Dispense Refill   ALPRAZolam  (XANAX ) 0.5 MG tablet Take 1 tablet (0.5 mg total) by mouth at bedtime as needed for anxiety. 30 tablet 0   lidocaine -prilocaine  (EMLA ) cream Apply 1 Application topically as needed. 30 g 3   ondansetron  (ZOFRAN ) 4 MG tablet Take 1 tablet (4 mg total) by mouth daily as needed for nausea or vomiting. 30 tablet 1   ondansetron  (ZOFRAN ) 8 MG tablet Take 8 mg by mouth 3 (three) times daily.     phytonadione (VITAMIN K) 5 MG tablet Take 2 tablets (10 mg total) by mouth daily. 6 tablet 0   prochlorperazine  (COMPAZINE ) 10 MG tablet TAKE 1 TABLET(10 MG) BY MOUTH EVERY 6 HOURS AS NEEDED FOR NAUSEA OR VOMITING 30 tablet 1  tenofovir  alafenamide (VEMLIDY ) 25 MG tablet Take 1 tablet (25 mg total) by mouth daily. 30 tablet 11   traMADol  (ULTRAM ) 50 MG tablet Take 1 tablet (50 mg total) by mouth every 6 (six) hours as needed for severe pain (pain score 7-10) or moderate pain (pain score 4-6). 50 tablet 0   pantoprazole  (PROTONIX ) 40 MG tablet Take 1 tablet (40 mg total) by mouth daily at 6 (six) AM. 30 tablet 1   No current facility-administered medications for this visit.   Facility-Administered Medications Ordered in Other Visits  Medication Dose Route Frequency Provider Last Rate Last Admin   0.9 %  sodium chloride  infusion   Intravenous Continuous Nekeisha Aure, MD   Stopped at 12/25/23 1353     VITALS:   Blood pressure  114/67, pulse 70, temperature 98 F (36.7 C), temperature source Temporal, resp. rate 16, height 5' (1.524 m), weight 115 lb 8 oz (52.4 kg), SpO2 100%.  Wt Readings from Last 3 Encounters:  12/25/23 115 lb 8 oz (52.4 kg)  12/04/23 115 lb 8 oz (52.4 kg)  11/13/23 114 lb 11.2 oz (52 kg)    Body mass index is 22.56 kg/m.    Onc Performance Status - 12/25/23 1109       ECOG Perf Status   ECOG Perf Status Restricted in physically strenuous activity but ambulatory and able to carry out work of a light or sedentary nature, e.g., light house work, office work      KPS SCALE   KPS % SCORE Normal activity with effort, some s/s of disease           PHYSICAL EXAM:   Physical Exam Constitutional:      General: She is not in acute distress.    Appearance: Normal appearance.  HENT:     Head: Normocephalic and atraumatic.   Eyes:     General: No scleral icterus.    Conjunctiva/sclera: Conjunctivae normal.    Cardiovascular:     Rate and Rhythm: Normal rate and regular rhythm.     Heart sounds: Normal heart sounds.  Pulmonary:     Effort: Pulmonary effort is normal.     Breath sounds: Normal breath sounds.  Abdominal:     Palpations: There is mass (stable hepatomegaly).   Musculoskeletal:     Right lower leg: No edema.     Left lower leg: No edema.   Neurological:     General: No focal deficit present.     Mental Status: She is alert.   Psychiatric:        Mood and Affect: Mood normal.        Behavior: Behavior normal.      LABORATORY DATA:   I have reviewed the data as listed.  Results for orders placed or performed in visit on 12/25/23  Fibrinogen  Result Value Ref Range   Fibrinogen 402 210 - 475 mg/dL  APTT  Result Value Ref Range   aPTT 37 (H) 24 - 36 seconds  Protime-INR  Result Value Ref Range   Prothrombin Time 18.3 (H) 11.4 - 15.2 seconds   INR 1.5 (H) 0.8 - 1.2  Results for orders placed or performed in visit on 12/25/23  TSH  Result Value Ref  Range   TSH 4.540 (H) 0.350 - 4.500 uIU/mL  Total Protein, Urine dipstick  Result Value Ref Range   Protein, ur NEGATIVE NEGATIVE mg/dL  CMP (Cancer Center only)  Result Value Ref Range   Sodium 137 135 -  145 mmol/L   Potassium 4.0 3.5 - 5.1 mmol/L   Chloride 104 98 - 111 mmol/L   CO2 26 22 - 32 mmol/L   Glucose, Bld 86 70 - 99 mg/dL   BUN 7 6 - 20 mg/dL   Creatinine 7.82 9.56 - 1.00 mg/dL   Calcium 9.9 8.9 - 21.3 mg/dL   Total Protein 8.9 (H) 6.5 - 8.1 g/dL   Albumin 4.0 3.5 - 5.0 g/dL   AST 086 (HH) 15 - 41 U/L   ALT 106 (H) 0 - 44 U/L   Alkaline Phosphatase 84 38 - 126 U/L   Total Bilirubin 0.8 0.0 - 1.2 mg/dL   GFR, Estimated >57 >84 mL/min   Anion gap 7 5 - 15  CBC with Differential (Cancer Center Only)  Result Value Ref Range   WBC Count 5.4 4.0 - 10.5 K/uL   RBC 3.06 (L) 3.87 - 5.11 MIL/uL   Hemoglobin 8.6 (L) 12.0 - 15.0 g/dL   HCT 69.6 (L) 29.5 - 28.4 %   MCV 83.7 80.0 - 100.0 fL   MCH 28.1 26.0 - 34.0 pg   MCHC 33.6 30.0 - 36.0 g/dL   RDW 13.2 (H) 44.0 - 10.2 %   Platelet Count 295 150 - 400 K/uL   nRBC 0.0 0.0 - 0.2 %   Neutrophils Relative % 42 %   Neutro Abs 2.3 1.7 - 7.7 K/uL   Lymphocytes Relative 35 %   Lymphs Abs 1.9 0.7 - 4.0 K/uL   Monocytes Relative 10 %   Monocytes Absolute 0.5 0.1 - 1.0 K/uL   Eosinophils Relative 11 %   Eosinophils Absolute 0.6 (H) 0.0 - 0.5 K/uL   Basophils Relative 2 %   Basophils Absolute 0.1 0.0 - 0.1 K/uL   Immature Granulocytes 0 %   Abs Immature Granulocytes 0.01 0.00 - 0.07 K/uL     RADIOGRAPHIC STUDIES:  I have personally reviewed the radiological images as listed and agree with the findings in the report.  MR LIVER W WO CONTRAST Result Date: 12/18/2023 EXAM: MRI ABDOMEN 12/14/2023 09:00:04 AM TECHNIQUE: Multiplanar multisequence MRI of the abdomen was performed with and without the administration of intravenous contrast. COMPARISON: Comparison to a study dated 10/19/23. CLINICAL HISTORY: Restaging hepatocellular  carcinoma, currently receiving immunotherapy. Follow-up for questionable hemangioma. FINDINGS: LIVER: Large mass replacing the right hepatic lobe measuring 13.2 x 15.9 x 17.4 cm (series 4, image 23), previously 12.7 x 16.5 x 17.7 cm, grossly unchanged. Additional mass in segment 4a measuring 6.9 x 8.8 x 7.8 cm (series 4, image 11), previously 6.8 x 8.1 x 7.8 cm, unchanged. GALLBLADDER AND BILIARY SYSTEM: Unremarkable. SPLEEN: Unremarkable. PANCREAS: Unremarkable. ADRENAL GLANDS: Unremarkable. KIDNEYS: 18 mm simple posterior right renal cyst (series 3, image 23), benign (Bosniak 1), no follow-up is recommended. LYMPH NODES: No enlarged abdominal lymph nodes. VASCULATURE: Unremarkable. PERITONEUM: No ascites. ABDOMINAL WALL: No hernia. No mass. BOWEL: Grossly unremarkable. No bowel obstruction. BONES: No acute abnormality or worrisome osseous lesion. SOFT TISSUES: Unremarkable. MISCELLANEOUS: Unremarkable. IMPRESSION: 1. Two large hepatic masses, measuring 17.4 and 8.8 cm, grossly unchanged. These correspond to the patient's known multifocal HCC. Electronically signed by: Zadie Herter MD 12/18/2023 09:22 PM EDT RP Workstation: [id number]     CODE STATUS:  Code Status History     Date Active Date Inactive Code Status Order ID Comments User Context   08/29/2023 0409 08/31/2023 1925 Full Code [id number]  Bary Boss, DO ED   08/21/2023 0400 08/23/2023 2127 Full Code [id number]  Sundil, Subrina, MD ED    Questions for Most Recent Historical Code Status (Order [id number])     Question Answer   By: Consent: discussion documented in EHR            Orders Placed This Encounter  Procedures   Protime-INR    Standing Status:   Future    Number of Occurrences:   1    Expiration Date:   12/24/2024   APTT    Standing Status:   Future    Number of Occurrences:   1    Expiration Date:   12/24/2024   Fibrinogen    Standing Status:   Future    Number of Occurrences:   1    Expiration Date:   12/24/2024    CBC with Differential (Cancer Center Only)    Standing Status:   Future    Expected Date:   02/26/2024    Expiration Date:   02/25/2025   CMP (Cancer Center only)    Standing Status:   Future    Expected Date:   02/26/2024    Expiration Date:   02/25/2025   T4    Standing Status:   Future    Expected Date:   02/26/2024    Expiration Date:   02/25/2025   TSH    Standing Status:   Future    Expected Date:   02/26/2024    Expiration Date:   02/25/2025   Total Protein, Urine dipstick    Standing Status:   Future    Expected Date:   02/26/2024    Expiration Date:   02/25/2025     Future Appointments  Date Time Provider Department Center  01/15/2024 10:00 AM [facility] FLUSH [facility] None  01/15/2024 10:30 AM Bassy Fetterly, MD [facility] None  01/15/2024 11:00 AM [facility] INFUSION [facility] None     This document was completed utilizing speech recognition software. Grammatical errors, random word insertions, pronoun errors, and incomplete sentences are an occasional consequence of this system due to software limitations, ambient noise, and hardware issues. Any formal questions or concerns about the content, text or information contained within the body of this dictation should be directly addressed to the provider for clarification.

## 2023-12-25 NOTE — Patient Instructions (Signed)
 Instrucciones al darle de alta: Discharge Instructions Gracias por elegir al [facility] de Cncer de  para brindarle atencin mdica de oncologa y Teacher, English as a foreign language.   Si usted tiene una cita de laboratorio con el Centro de Cncer, por favor vaya directamente al [employer] de Cncer y regstrese en el rea de Engineer, maintenance (IT).   Use ropa cmoda y [place] para tener fcil acceso a las vas del Portacath (acceso venoso de larga duracin) o la lnea PICC (catter central colocado por va perifrica).   Nos esforzamos por ofrecerle tiempo de calidad con su proveedor. Es posible que tenga que volver a programar su cita si llega tarde (15 minutos o ms).  El llegar tarde le afecta a usted y a otros pacientes cuyas citas son posteriores a Armed forces operational officer.  Adems, si usted falta a tres o ms citas sin avisar a la oficina, puede ser retirado(a) de la clnica a discrecin del proveedor.      Para las solicitudes de renovacin de recetas, pida a su farmacia que se ponga en contacto con nuestra oficina y deje que transcurran 72 horas para que se complete el proceso de las renovaciones.    Hoy usted recibi los siguientes agentes de quimioterapia e/o inmunoterapia: atezolizumab  and bevacizumab       Para ayudar a prevenir las nuseas y los vmitos despus de su tratamiento, le recomendamos que tome su medicamento para las nuseas segn las indicaciones.  LOS SNTOMAS QUE DEBEN COMUNICARSE INMEDIATAMENTE SE INDICAN A CONTINUACIN: *FIEBRE SUPERIOR A 100.4 F (38 C) O MS *ESCALOFROS O SUDORACIN *NUSEAS Y VMITOS QUE NO SE CONTROLAN CON EL MEDICAMENTO PARA LAS NUSEAS *DIFICULTAD INUSUAL PARA RESPIRAR  *MORETONES O HEMORRAGIAS NO HABITUALES *PROBLEMAS URINARIOS (dolor o ardor al Geographical information systems officer o frecuencia para Geographical information systems officer) *PROBLEMAS INTESTINALES (diarrea inusual, estreimiento, dolor cerca del ano) SENSIBILIDAD EN LA BOCA Y EN LA GARGANTA CON O SIN LA PRESENCIA DE LCERAS (dolor de garganta, llagas en la boca o dolor de  muelas/dientes) ERUPCIN, HINCHAZN O DOLORES INUSUALES FLUJO VAGINAL INUSUAL O PICAZN/RASQUIA    Los puntos marcados con un asterisco ( *) indican una posible emergencia y debe hacer un seguimiento tan pronto como le sea posible o vaya al Departamento de Emergencias si se le presenta algn problema.  Por favor, muestre la [place] DE ADVERTENCIA DE Christina Gutierrez DE ADVERTENCIA DE Christina Gutierrez al registrarse en [address] de Emergencias y a la enfermera de triaje.  Si tiene preguntas despus de su visita o necesita cancelar o volver a programar su cita, por favor pngase en contacto con [facility] - A DEPT OF [facility]  Dept: [phone] y siga las instrucciones. Las horas de oficina son de 8:00 a.m. a 4:30 p.m. de lunes a viernes. Por favor, tenga en cuenta que los mensajes de voz que se dejan despus de las 4:00 p.m. posiblemente no se devolvern hasta el siguiente da de trabajo.  Cerramos los fines de semana y [employer]. En todo momento tiene acceso a una enfermera para preguntas urgentes. Por favor, llame al nmero principal de la clnica Dept: [phone] y siga las instrucciones.   Para cualquier pregunta que no sea de carcter urgente, tambin puede ponerse en contacto con su proveedor utilizando MyChart. Ahora ofrecemos visitas electrnicas para cualquier persona mayor de 18 aos que solicite atencin mdica en lnea para los sntomas que no sean urgentes. Para ms detalles vaya a [email or web address].   Tambin puede bajar la aplicacin de MyChart! Vaya  a la tienda de aplicaciones, busque MyChart, abra la aplicacin, seleccione [place], e ingrese con su nombre de usuario y la contrasea de [name].

## 2023-12-25 NOTE — Progress Notes (Signed)
 Patient seen by Dr. Gale Jude Pasam today  Vitals are within treatment parameters:Yes   Labs are within treatment parameters: No (Please specify and give further instructions.)   AST 176  Treatment plan has been signed: Yes   Per physician team, Patient is ready for treatment and there are NO modifications to the treatment plan.   AST 176, Dr. Randye Buttner aware with order to proceed with treatment.

## 2023-12-25 NOTE — Progress Notes (Unsigned)
 CRITICAL VALUE STICKER  CRITICAL VALUE: AST 176  RECEIVER (on-site recipient of call): Odilia Bennett   DATE & TIME NOTIFIED: 12/25/23 @ 11:18   MESSENGER (representative from lab): Nellie Banas   MD NOTIFIED: Dr. Randye Buttner  TIME OF NOTIFICATION: 11:19 route  RESPONSE: patient has appt with Dr. Randye Buttner today

## 2023-12-25 NOTE — Assessment & Plan Note (Addendum)
 Reports daily gingival bleeding, even when drinking water. This is a new symptom with no prior history.  No obvious concerns noted on examination.    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  INR elevated at 1.5.  Will give her a trial of vitamin K 10 mg daily for 3 days to see if it would help with her symptoms.  Previously we sent her to dentist for further evaluation.  Appointment pending.

## 2023-12-25 NOTE — Progress Notes (Signed)
 Specialty Pharmacy Refill Coordination Note  Christina Gutierrez is a 48 y.o. female contacted today regarding refills of specialty medication(s) Tenofovir  Alafenamide Fumarate (VEMLIDY )   Patient requested Pickup at [facility] at [place] date: 12/25/23   Medication will be filled on 12/25/23.

## 2023-12-25 NOTE — Assessment & Plan Note (Addendum)
 Please review oncology history for additional details and timeline of events.  MRI of the abdomen with liver protocol on 08/21/2023 showed numerous hepatic lesions are noted, with a dominant lesion which replaces much of the right lobe of the liver estimated to measure approximately 16.5 x 12.7 x 17.7 cm. This lesion is heterogeneous in signal intensity on T1 and T2 weighted images, but clearly has internal areas of hypervascular enhancement on early phase post gadolinium imaging, with persistent low-level enhancement on more delayed imaging. The other dominant lesion is centered in the superior aspect of the liver, likely within the superior aspect of segment 4A estimated to measure approximately 8.1 x 6.8 x 7.8 cm, with similar imaging characteristics to the previously described lesion, although with greater degree of internal washout and probable pseudo capsule on delayed imaging. Multiple other smaller hypervascular lesions are apparent on arterial phase imaging throughout all aspects of the liver, some of which demonstrate delayed washout.   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  This was confirmed during GI tumor conference on 09/09/2023.  -Previously undiagnosed hepatitis B could have been the contributing factor. -Not a candidate for liver transplant or surgical resection because of the extent of disease. -Discussed diagnosis, prognosis, plan of care, treatment options.  Reviewed NCCN guidelines.   On 08/21/2023, AFP was significantly elevated at 32,160 ng/mL.     CT chest on 08/23/2023 showed no evidence of intrathoracic metastatic disease.  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  Plan made for systemic treatments with atezolizumab  plus bevacizumab .  Started this from 09/11/2023.  She has been tolerating treatments well without any major side effects.  Restaging MRI of the liver on 12/14/2023 showed stable disease without evidence of disease progression or new disease.  Plan  to continue current management.  Labs today reveal no dose-limiting toxicities.  Persistent elevated LFTs but improved compared to prior.  No dose-limiting toxicities.  Will proceed with cycle 6 of atezolizumab  plus bevacizumab  today.   RTC in 3 weeks for labs, follow-up, cycle 7 of treatment.

## 2023-12-25 NOTE — Assessment & Plan Note (Signed)
 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   She is currently on tenofovir.  Follows up with Dr. Daiva Eves at ID clinic.  - Monitor liver function tests with each visit and ammonia levels as indicated

## 2023-12-26 ENCOUNTER — Other Ambulatory Visit: Payer: Self-pay

## 2023-12-26 LAB — T4: T4, Total: 12.5 ug/dL — ABNORMAL HIGH (ref 4.5–12.0)

## 2023-12-28 ENCOUNTER — Other Ambulatory Visit (HOSPITAL_COMMUNITY): Payer: Self-pay

## 2023-12-28 ENCOUNTER — Encounter: Payer: Self-pay | Admitting: Oncology

## 2024-01-06 ENCOUNTER — Other Ambulatory Visit: Payer: Self-pay

## 2024-01-12 ENCOUNTER — Other Ambulatory Visit: Payer: Self-pay

## 2024-01-13 ENCOUNTER — Other Ambulatory Visit: Payer: Self-pay

## 2024-01-13 NOTE — Progress Notes (Signed)
 Spoke with translator, Mliss, who informed me that patient reported having bleeding gums every night. Also, pt reported to translator that she was unable to afford Vitamin K that Dr. Autumn prescribed. Dr. Autumn informed as pt has chemo tx this Friday. Interpreter agreed to follow up with pt to use a larger absorbant material at night for her mouth to prevent choking. Also, informed translator to tell patient she may use gauze roll and cut in large pieces to fit in mouth. Gauze roll put aside for pt's visit on Friday as well. Dr. Autumn will see patient this Friday and discuss further.

## 2024-01-15 ENCOUNTER — Inpatient Hospital Stay (HOSPITAL_BASED_OUTPATIENT_CLINIC_OR_DEPARTMENT_OTHER): Payer: Self-pay | Attending: Oncology | Admitting: Oncology

## 2024-01-15 ENCOUNTER — Other Ambulatory Visit: Payer: Self-pay

## 2024-01-15 ENCOUNTER — Encounter: Payer: Self-pay | Admitting: Oncology

## 2024-01-15 ENCOUNTER — Inpatient Hospital Stay: Payer: Self-pay | Attending: Oncology

## 2024-01-15 ENCOUNTER — Inpatient Hospital Stay: Payer: Self-pay

## 2024-01-15 ENCOUNTER — Other Ambulatory Visit (HOSPITAL_COMMUNITY): Payer: Self-pay

## 2024-01-15 VITALS — BP 97/66 | Temp 98.1°F | Resp 20 | Wt 113.2 lb

## 2024-01-15 DIAGNOSIS — B181 Chronic viral hepatitis B without delta-agent: Secondary | ICD-10-CM | POA: Insufficient documentation

## 2024-01-15 DIAGNOSIS — Z95828 Presence of other vascular implants and grafts: Secondary | ICD-10-CM

## 2024-01-15 DIAGNOSIS — C220 Liver cell carcinoma: Secondary | ICD-10-CM

## 2024-01-15 DIAGNOSIS — F5109 Other insomnia not due to a substance or known physiological condition: Secondary | ICD-10-CM

## 2024-01-15 DIAGNOSIS — Z5112 Encounter for antineoplastic immunotherapy: Secondary | ICD-10-CM | POA: Insufficient documentation

## 2024-01-15 DIAGNOSIS — G893 Neoplasm related pain (acute) (chronic): Secondary | ICD-10-CM | POA: Insufficient documentation

## 2024-01-15 DIAGNOSIS — D649 Anemia, unspecified: Secondary | ICD-10-CM | POA: Insufficient documentation

## 2024-01-15 DIAGNOSIS — K068 Other specified disorders of gingiva and edentulous alveolar ridge: Secondary | ICD-10-CM | POA: Insufficient documentation

## 2024-01-15 DIAGNOSIS — Z79899 Other long term (current) drug therapy: Secondary | ICD-10-CM | POA: Insufficient documentation

## 2024-01-15 DIAGNOSIS — Z79624 Long term (current) use of inhibitors of nucleotide synthesis: Secondary | ICD-10-CM | POA: Insufficient documentation

## 2024-01-15 LAB — CBC WITH DIFFERENTIAL (CANCER CENTER ONLY)
Abs Immature Granulocytes: 0.06 K/uL (ref 0.00–0.07)
Basophils Absolute: 0.1 K/uL (ref 0.0–0.1)
Basophils Relative: 2 %
Eosinophils Absolute: 0.6 K/uL — ABNORMAL HIGH (ref 0.0–0.5)
Eosinophils Relative: 10 %
HCT: 24.9 % — ABNORMAL LOW (ref 36.0–46.0)
Hemoglobin: 8.4 g/dL — ABNORMAL LOW (ref 12.0–15.0)
Immature Granulocytes: 1 %
Lymphocytes Relative: 33 %
Lymphs Abs: 1.8 K/uL (ref 0.7–4.0)
MCH: 27.5 pg (ref 26.0–34.0)
MCHC: 33.7 g/dL (ref 30.0–36.0)
MCV: 81.4 fL (ref 80.0–100.0)
Monocytes Absolute: 0.5 K/uL (ref 0.1–1.0)
Monocytes Relative: 10 %
Neutro Abs: 2.4 K/uL (ref 1.7–7.7)
Neutrophils Relative %: 44 %
Platelet Count: 290 K/uL (ref 150–400)
RBC: 3.06 MIL/uL — ABNORMAL LOW (ref 3.87–5.11)
RDW: 19.1 % — ABNORMAL HIGH (ref 11.5–15.5)
WBC Count: 5.4 K/uL (ref 4.0–10.5)
nRBC: 0 % (ref 0.0–0.2)

## 2024-01-15 LAB — CMP (CANCER CENTER ONLY)
ALT: 87 U/L — ABNORMAL HIGH (ref 0–44)
AST: 161 U/L — ABNORMAL HIGH (ref 15–41)
Albumin: 3.9 g/dL (ref 3.5–5.0)
Alkaline Phosphatase: 86 U/L (ref 38–126)
Anion gap: 8 (ref 5–15)
BUN: 9 mg/dL (ref 6–20)
CO2: 26 mmol/L (ref 22–32)
Calcium: 10 mg/dL (ref 8.9–10.3)
Chloride: 102 mmol/L (ref 98–111)
Creatinine: 0.65 mg/dL (ref 0.44–1.00)
GFR, Estimated: >60 mL/min (ref >60)
Glucose, Bld: 76 mg/dL (ref 70–99)
Potassium: 4.1 mmol/L (ref 3.5–5.1)
Sodium: 136 mmol/L (ref 135–145)
Total Bilirubin: 0.8 mg/dL (ref 0.0–1.2)
Total Protein: 8.8 g/dL — ABNORMAL HIGH (ref 6.5–8.1)

## 2024-01-15 LAB — TSH: TSH: 3.11 u[IU]/mL (ref 0.350–4.500)

## 2024-01-15 LAB — TOTAL PROTEIN, URINE DIPSTICK: Protein, ur: NEGATIVE mg/dL

## 2024-01-15 LAB — PREGNANCY, URINE: Preg Test, Ur: NEGATIVE

## 2024-01-15 MED ORDER — SODIUM CHLORIDE 0.9 % IV SOLN
1200.0000 mg | Freq: Once | INTRAVENOUS | Status: AC
  Administered 2024-01-15: 1200 mg via INTRAVENOUS
  Filled 2024-01-15: qty 20

## 2024-01-15 MED ORDER — SODIUM CHLORIDE 0.9% FLUSH
10.0000 mL | Freq: Once | INTRAVENOUS | Status: AC
Start: 2024-01-15 — End: 2024-01-15
  Administered 2024-01-15: 10 mL

## 2024-01-15 MED ORDER — HEPARIN SOD (PORK) LOCK FLUSH 100 UNIT/ML IV SOLN
500.0000 [IU] | Freq: Once | INTRAVENOUS | Status: AC | PRN
  Administered 2024-01-15: 500 [IU]

## 2024-01-15 MED ORDER — ONDANSETRON HCL 8 MG PO TABS
8.0000 mg | ORAL_TABLET | Freq: Three times a day (TID) | ORAL | 2 refills | Status: DC | PRN
  Filled 2024-01-15 (×2): qty 60, 20d supply, fill #0

## 2024-01-15 MED ORDER — LIDOCAINE-PRILOCAINE 2.5-2.5 % EX CREA
1.0000 | TOPICAL_CREAM | CUTANEOUS | 3 refills | Status: DC | PRN
  Filled 2024-01-15: qty 30, 30d supply, fill #0
  Filled 2024-01-15: qty 30, 7d supply, fill #0

## 2024-01-15 MED ORDER — SODIUM CHLORIDE 0.9% FLUSH
10.0000 mL | INTRAVENOUS | Status: DC | PRN
  Administered 2024-01-15: 10 mL

## 2024-01-15 MED ORDER — SODIUM CHLORIDE 0.9 % IV SOLN: INTRAVENOUS | Status: DC

## 2024-01-15 MED ORDER — ALPRAZOLAM 0.5 MG PO TABS
0.5000 mg | ORAL_TABLET | Freq: Every evening | ORAL | 0 refills | Status: DC | PRN
  Filled 2024-01-15 (×2): qty 30, 30d supply, fill #0

## 2024-01-15 NOTE — Assessment & Plan Note (Signed)
 Since we would like to avoid Tylenol  containing products, she was prescribed oxycodone  previously.  However it caused her to have dizziness and nausea.  She will requested for a milder regimen.  On last visit I sent a prescription for tramadol  50 mg to be taken every 6 hours as needed for pain.  Will assess tolerance and adjust dose as needed.

## 2024-01-15 NOTE — Patient Instructions (Signed)
 Instrucciones al darle de alta: Discharge Instructions Gracias por elegir al [facility] de Cncer de [place] para [place] atencin mdica de oncologa y Teacher, English as a foreign language.   Si usted tiene una cita de laboratorio con el Cent[facility] de Cncer, por favor vaya directamente al [employer] de Cncer y regstrese en el rea de Engineer, maintenance (IT).   Use ropa cmoda y [place] para tener fcil acceso a las vas del Portacath (acceso venoso de larga duracin) o la lnea PICC (catter central colocado por va perifrica).   Nos esforzamos por ofrecerle tiempo de calidad con su proveedor. Es posible que tenga que volver a programar su cita si llega tarde (15 minutos o ms).  El llegar tarde le afecta a usted y a otros pacientes cuyas citas son posteriores a Armed forces operational officer.  Adems, si usted falta a tres o ms citas sin avisar a la oficina, puede ser retirado(a) de la clnica a discrecin del proveedor.      Para las solicitudes de renovacin de recetas, pida a su farmacia que se ponga en contacto con nuestra oficina y deje que transcurran 72 horas para que se complete el proceso de las renovaciones.    Hoy usted recibi los siguientes agentes de quimioterapia e/o inmunoterapia: atezolizumab       Para ayudar a prevenir las nuseas y los vmitos despus de su tratamiento, le recomendamos que tome su medicamento para las nuseas segn las indicaciones.  LOS SNTOMAS QUE DEBEN COMUNICARSE INMEDIATAMENTE SE INDICAN A CONTINUACIN: *FIEBRE SUPERIOR A 100.4 F (38 C) O MS *ESCALOFROS O SUDORACIN *NUSEAS Y VMITOS QUE NO SE CONTROLAN CON EL MEDICAMENTO PARA LAS NUSEAS *DIFICULTAD INUSUAL PARA RESPIRAR  *MORETONES O HEMORRAGIAS NO HABITUALES *PROBLEMAS URINARIOS (dolor o ardor al Geographical information systems officer o frecuencia para Geographical information systems officer) *PROBLEMAS INTESTINALES (diarrea inusual, estreimiento, dolor cerca del ano) SENSIBILIDAD EN LA BOCA Y EN LA GARGANTA CON O SIN LA PRESENCIA DE LCERAS (dolor de garganta, llagas en la boca o dolor de muelas/dientes) ERUPCIN,  HINCHAZN O DOLORES INUSUALES FLUJO VAGINAL INUSUAL O PICAZN/RASQUIA    Los puntos marcados con un asterisco ( *) indican una posible emergencia y debe hacer un seguimiento tan pronto como le sea posible o vaya al Departamento de Emergencias si se le presenta algn problema.  Por favor, muestre la [place] DE ADVERTENCIA DE BABS [place] DE ADVERTENCIA DE LTANYA al registrarse en [address] de Emergencias y a la enfermera de triaje.  Si tiene preguntas despus de su visita o necesita cancelar o volver a programar su cita, por favor pngase en contacto con [facility]  Dept: [phone] y siga las instrucciones. Las horas de oficina son de 8:00 a.m. a 4:30 p.m. de lunes a viernes. Por favor, tenga en cuenta que los mensajes de voz que se dejan despus de las 4:00 p.m. posiblemente no se devolvern hasta el siguiente da de trabajo.  Cerramos los fines de semana y [employer]. En todo momento tiene acceso a una enfermera para preguntas urgentes. Por favor, llame al nmero principal de la clnica Dept: [phone] y siga las instrucciones.   Para cualquier pregunta que no sea de carcter urgente, tambin puede ponerse en contacto con su proveedor utilizando MyChart. Ahora ofrecemos visitas electrnicas para cualquier persona mayor de 18 aos que solicite atencin mdica en lnea para los sntomas que no sean urgentes. Para ms detalles vaya a [email or web address].   Tambin puede bajar la aplicacin de MyChart! Vaya a la  tienda de aplicaciones, busque MyChart, abra la aplicacin, seleccione [place], e ingrese con su nombre de usuario y la contrasea de [name].

## 2024-01-15 NOTE — Assessment & Plan Note (Signed)
 Bleeding occurs during relaxation, napping, and tooth brushing. Liver dysfunction may contribute. - Hold Avastin  therapy with this cycle and next cycle to see if that would also help with her gingival bleeding. - Refer to a dentist for evaluation of dental issues

## 2024-01-15 NOTE — Assessment & Plan Note (Addendum)
 Please review oncology history for additional details and timeline of events.  MRI of the abdomen with liver protocol on 08/21/2023 showed numerous hepatic lesions are noted, with a dominant lesion which replaces much of the right lobe of the liver estimated to measure approximately 16.5 x 12.7 x 17.7 cm. This lesion is heterogeneous in signal intensity on T1 and T2 weighted images, but clearly has internal areas of hypervascular enhancement on early phase post gadolinium imaging, with persistent low-level enhancement on more delayed imaging. The other dominant lesion is centered in the superior aspect of the liver, likely within the superior aspect of segment 4A estimated to measure approximately 8.1 x 6.8 x 7.8 cm, with similar imaging characteristics to the previously described lesion, although with greater degree of internal washout and probable pseudo capsule on delayed imaging. Multiple other smaller hypervascular lesions are apparent on arterial phase imaging throughout all aspects of the liver, some of which demonstrate delayed washout.   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  This was confirmed during GI tumor conference on 09/09/2023.  -Previously undiagnosed hepatitis B could have been the contributing factor. -Not a candidate for liver transplant or surgical resection because of the extent of disease. -Discussed diagnosis, prognosis, plan of care, treatment options.  Reviewed NCCN guidelines.   On 08/21/2023, AFP was significantly elevated at 32,160 ng/mL.     CT chest on 08/23/2023 showed no evidence of intrathoracic metastatic disease.  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  Plan made for systemic treatments with atezolizumab  plus bevacizumab .  Started this from 09/11/2023.  She has been tolerating treatments well without any major side effects.  Restaging MRI of the liver on 12/14/2023 showed stable disease without evidence of disease progression or new disease.  Plan  to continue current management.  However given progressive gum bleeding, we will hold bevacizumab  to see if that would help improve her symptoms.  Labs today reveal no dose-limiting toxicities.  Persistent elevated LFTs but improved compared to prior.  No dose-limiting toxicities.  Will proceed with cycle 7 of atezolizumab  today and skip bevacizumab  with current cycle and for next cycle to see if that will help with her gum bleeding.  RTC in 3 weeks for labs, follow-up, cycle 8 of treatment.   - Schedule follow-up scan in mid-August to assess disease stability  - Switch to durvalumab or other agent if disease shows signs of worsening

## 2024-01-15 NOTE — Progress Notes (Signed)
 Patient seen by Dr. Archie Patten Pasam today  Vitals are within treatment parameters:Yes   Labs are within treatment parameters: Yes   Treatment plan has been signed: Yes   Per physician team, Patient is ready for treatment and there are NO modifications to the treatment plan.

## 2024-01-15 NOTE — Progress Notes (Signed)
 [facility]  ONCOLOGY CLINIC PROGRESS NOTE   Patient Care Team: [name], MD as PCP - General (Oncology)  PATIENT NAME: Christina Gutierrez   MR#: [id number] DOB: 1976-05-04  Date of visit: 01/15/2024   ASSESSMENT & PLAN:   Christina Gutierrez is a 48 y.o. Hispanic lady with no significant past medical history was hospitalized on 08/21/2023 after she presented with worsening right-sided abdominal pain, nausea, vomiting, decreased oral intake.  Workup including MRI of the abdomen showed evidence of multifocal hepatocellular carcinoma.  She was diagnosed with hepatitis B infection during this hospital admission.   Hepatocellular carcinoma (HCC) Please review oncology history for additional details and timeline of events.  MRI of the abdomen with liver protocol on 08/21/2023 showed numerous hepatic lesions are noted, with a dominant lesion which replaces much of the right lobe of the liver estimated to measure approximately 16.5 x 12.7 x 17.7 cm. This lesion is heterogeneous in signal intensity on T1 and T2 weighted images, but clearly has internal areas of hypervascular enhancement on early phase post gadolinium imaging, with persistent low-level enhancement on more delayed imaging. The other dominant lesion is centered in the superior aspect of the liver, likely within the superior aspect of segment 4A estimated to measure approximately 8.1 x 6.8 x 7.8 cm, with similar imaging characteristics to the previously described lesion, although with greater degree of internal washout and probable pseudo capsule on delayed imaging. Multiple other smaller hypervascular lesions are apparent on arterial phase imaging throughout all aspects of the liver, some of which demonstrate delayed washout.   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  This was confirmed during GI tumor conference on 09/09/2023.  -Previously undiagnosed hepatitis B could have  been the contributing factor. -Not a candidate for liver transplant or surgical resection because of the extent of disease. -Discussed diagnosis, prognosis, plan of care, treatment options.  Reviewed NCCN guidelines.   On 08/21/2023, AFP was significantly elevated at 32,160 ng/mL.     CT chest on 08/23/2023 showed no evidence of intrathoracic metastatic disease.  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  Plan made for systemic treatments with atezolizumab  plus bevacizumab .  Started this from 09/11/2023.  She has been tolerating treatments well without any major side effects.  Restaging MRI of the liver on 12/14/2023 showed stable disease without evidence of disease progression or new disease.  Plan to continue current management.  However given progressive gum bleeding, we will hold bevacizumab  to see if that would help improve her symptoms.  Labs today reveal no dose-limiting toxicities.  Persistent elevated LFTs but improved compared to prior.  No dose-limiting toxicities.  Will proceed with cycle 7 of atezolizumab  today and skip bevacizumab  with current cycle and for next cycle to see if that will help with her gum bleeding.  RTC in 3 weeks for labs, follow-up, cycle 8 of treatment.   - Schedule follow-up scan in mid-August to assess disease stability  - Switch to durvalumab or other agent if disease shows signs of worsening  Gingival bleeding Bleeding occurs during relaxation, napping, and tooth brushing. Liver dysfunction may contribute. - Hold Avastin  therapy with this cycle and next cycle to see if that would also help with her gingival bleeding. - Refer to a dentist for evaluation of dental issues  Cancer associated pain Since we would like to avoid Tylenol  containing products, she was prescribed oxycodone  previously.  However it caused her to have dizziness and nausea.  She will  requested for a milder regimen.  On last visit I sent a prescription for tramadol  50 mg to be taken every 6  hours as needed for pain.  Will assess tolerance and adjust dose as needed.  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   She is currently on tenofovir .  Follows up with Dr. Fleeta Rothman at ID clinic.  - Monitor liver function tests with each visit and ammonia levels as indicated   I reviewed lab results and outside records for this visit and discussed relevant results with the patient. Diagnosis, plan of care and treatment options were also discussed in detail with the patient. Opportunity provided to ask questions and answers provided to her apparent satisfaction. Provided instructions to call our clinic with any problems, questions or concerns prior to return visit. I recommended to continue follow-up with PCP and sub-specialists. She verbalized understanding and agreed with the plan.   NCCN guidelines have been consulted in the planning of this patient's care.  I spent a total of 30 minutes during this encounter with the patient including review of chart and various tests results, discussions about plan of care and coordination of care plan.  Chinita Patten, MD  01/15/2024 5:59 PM  [facility] - A DEPT [facility] [address] [place] [address] Dept: [phone] Dept Fax: [phone]    CHIEF COMPLAINT/ REASON FOR VISIT:   Multifocal hepatocellular carcinoma, diagnosed based on MRI findings.  Current Treatment: Systemic treatment with atezolizumab  plus bevacizumab  starting from 09/11/2023.  INTERVAL HISTORY:    Discussed the use of AI scribe software for clinical note transcription with the patient, who gave verbal consent to proceed.  History of Present Illness  Christina Gutierrez is a 48 year old female with cancer who presents with bleeding issues.  She is experiencing bleeding issues. She has been on a combination  of medications, including bevacizumab  (Avastin ), which is known to increase the risk of bleeding.  Her liver function is suboptimal as noted by increased PT and PTT.  Despite this, her liver enzyme levels, specifically AST, have shown improvement, decreasing from 280 to 160, although normal levels are around 45.  An MRI on December 14, 2023, showed stable disease with no new growth and some shrinkage in certain dimensions.  She is currently taking Zofran  as needed, up to three times a day, to manage symptoms. She experiences bleeding when brushing her teeth, despite using a very soft brush. She reports bleeding when brushing her teeth.    I have reviewed the past medical history, past surgical history, social history and family history with the patient and they are unchanged from previous note.  HISTORY OF PRESENT ILLNESS:   ONCOLOGY HISTORY:   48 y.o. Hispanic lady with no significant past medical history, presented to the emergency department on 08/21/2023 with complaints of right-sided abdominal pain, nausea, vomiting and decreased oral intake for at least 10 days prior to arrival.   On arrival to ED, she was found to have anemia with hemoglobin of 8, white count normal at 6100, platelet count 316,000.  CMP showed elevated AST of 80, ALT increased at 79, bilirubin elevated at 1.8.  Elevated ammonia level of 62.   Right upper quadrant ultrasound showed large heterogenoussolid mass within the liver measuring up to 16.9 cm, suspicious for malignancy. Recommend further evaluation with contrast-enhanced CT or MRI.   CT  abdomen pelvis showed: 15.5 x 12.5 cm solid well-circumscribed mass within the right hepatic lobe. Areas of central low-density suggest possibility of central scar. There may be a large feeding vessel. Favor focal nodular hyperplasia although fibrolamellar HCC can have a similar appearance. Recommend further evaluation MRI without and with contrast.   Left spontaneous splenorenal shunt  suggest portal venous hypertension. No evidence for cirrhosis. This may be related to mass effect and compression of the portal vein from the large mass.   She was recently diagnosed with chickenpox about 20 days prior to this hospitalization.  Still had some itching from vesicles.   She was admitted for further evaluation and management.   MRI of the abdomen with liver protocol on 08/21/2023 showed numerous hepatic lesions are noted, with a dominant lesion which replaces much of the right lobe of the liver estimated to measure approximately 16.5 x 12.7 x 17.7 cm. This lesion is heterogeneous in signal intensity on T1 and T2 weighted images, but clearly has internal areas of hypervascular enhancement on early phase post gadolinium imaging, with persistent low-level enhancement on more delayed imaging. The other dominant lesion is centered in the superior aspect of the liver, likely within the superior aspect of segment 4A estimated to measure approximately 8.1 x 6.8 x 7.8 cm, with similar imaging characteristics to the previously described lesion, although with greater degree of internal washout and probable pseudo capsule on delayed imaging. Multiple other smaller hypervascular lesions are apparent on arterial phase imaging throughout all aspects of the liver, some of which demonstrate delayed washout.   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   On 08/21/2023, AFP was significantly elevated at 32,160 ng/mL.     She was also diagnosed with hepatitis B infection on additional workup.   CT chest on 08/23/2023 showed no evidence of intrathoracic metastatic disease.   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  Her case was discussed in GI tumor conference on 09/09/2023.  Reviewed images with the team and confirmed multifocal hepatocellular carcinoma.   Plan made for systemic treatments with atezolizumab  plus bevacizumab .  Started from 09/11/2023.  Restaging MRI of the liver on 12/14/2023  showed stable disease without evidence of disease progression or new disease.  Plan to continue current management.  However given progressive gum bleeding, Avastin  was held from 01/15/2024.  Oncology History  Hepatocellular carcinoma (HCC)  08/22/2023 Initial Diagnosis   Hepatocellular carcinoma (HCC)   08/27/2023 Cancer Staging   Staging form: Liver, AJCC 8th Edition - Clinical: Stage IIIA (cT3, cN0, cM0) - Signed by [name], MD on 08/27/2023   09/11/2023 -  Chemotherapy   Patient is on Treatment Plan : HEPATOCELLULAR Atezolizumab  + Bevacizumab  q21d         REVIEW OF SYSTEMS:   Review of Systems - Oncology  All other pertinent systems were reviewed with the patient and are negative.  ALLERGIES: She has no known allergies.  MEDICATIONS:  Current Outpatient Medications  Medication Sig Dispense Refill   pantoprazole  (PROTONIX ) 40 MG tablet Take 1 tablet (40 mg total) by mouth daily at 6 (six) AM. 30 tablet 1   phytonadione  (VITAMIN K) 5 MG tablet Take 2 tablets (10 mg total) by mouth daily. 6 tablet 0   prochlorperazine  (COMPAZINE ) 10 MG tablet TAKE 1 TABLET(10 MG) BY MOUTH EVERY 6 HOURS AS NEEDED FOR NAUSEA OR VOMITING 30 tablet 1   tenofovir  alafenamide (VEMLIDY ) 25 MG tablet Take 1 tablet (25 mg total) by mouth daily. 30 tablet 11  traMADol  (ULTRAM ) 50 MG tablet Take 1 tablet (50 mg total) by mouth every 6 (six) hours as needed for severe pain (pain score 7-10) or moderate pain (pain score 4-6). 50 tablet 0   ALPRAZolam  (XANAX ) 0.5 MG tablet Take 1 tablet (0.5 mg total) by mouth at bedtime as needed for anxiety. 30 tablet 0   lidocaine -prilocaine  (EMLA ) cream Apply 1 Application topically as needed. 30 g 3   ondansetron  (ZOFRAN ) 8 MG tablet Take 1 tablet (8 mg total) by mouth every 8 (eight) hours as needed for nausea or vomiting. 60 tablet 2   No current facility-administered medications for this visit.   Facility-Administered Medications Ordered in Other Visits   Medication Dose Route Frequency Provider Last Rate Last Admin   0.9 %  sodium chloride  infusion   Intravenous Continuous Shannyn Jankowiak, MD   Stopped at 01/15/24 1303   sodium chloride  flush (NS) 0.9 % injection 10 mL  10 mL Intracatheter PRN Jaeleen Inzunza, MD   10 mL at 01/15/24 1302     VITALS:   Blood pressure 97/66, temperature 98.1 F (36.7 C), resp. rate 20, weight 113 lb 3.2 oz (51.3 kg), SpO2 100%.  Wt Readings from Last 3 Encounters:  01/15/24 113 lb 3.2 oz (51.3 kg)  12/25/23 115 lb 8 oz (52.4 kg)  12/04/23 115 lb 8 oz (52.4 kg)    Body mass index is 22.11 kg/m.    Onc Performance Status - 01/15/24 1117       ECOG Perf Status   ECOG Perf Status Restricted in physically strenuous activity but ambulatory and able to carry out work of a light or sedentary nature, e.g., light house work, office work      KPS SCALE   KPS % SCORE Normal activity with effort, some s/s of disease            PHYSICAL EXAM:   Physical Exam Constitutional:      General: She is not in acute distress.    Appearance: Normal appearance.  HENT:     Head: Normocephalic and atraumatic.  Eyes:     General: No scleral icterus.    Conjunctiva/sclera: Conjunctivae normal.  Cardiovascular:     Rate and Rhythm: Normal rate and regular rhythm.     Heart sounds: Normal heart sounds.  Pulmonary:     Effort: Pulmonary effort is normal.     Breath sounds: Normal breath sounds.  Abdominal:     Palpations: There is mass (stable hepatomegaly).  Musculoskeletal:     Right lower leg: No edema.     Left lower leg: No edema.  Neurological:     General: No focal deficit present.     Mental Status: She is alert.  Psychiatric:        Mood and Affect: Mood normal.        Behavior: Behavior normal.      LABORATORY DATA:   I have reviewed the data as listed.  Results for orders placed or performed in visit on 01/15/24  Total Protein, Urine dipstick  Result Value Ref Range   Protein, ur  NEGATIVE NEGATIVE mg/dL  TSH  Result Value Ref Range   TSH 3.110 0.350 - 4.500 uIU/mL  CMP (Cancer Center only)  Result Value Ref Range   Sodium 136 135 - 145 mmol/L   Potassium 4.1 3.5 - 5.1 mmol/L   Chloride 102 98 - 111 mmol/L   CO2 26 22 - 32 mmol/L   Glucose, Bld 76 70 -  99 mg/dL   BUN 9 6 - 20 mg/dL   Creatinine 9.34 9.55 - 1.00 mg/dL   Calcium 89.9 8.9 - 89.6 mg/dL   Total Protein 8.8 (H) 6.5 - 8.1 g/dL   Albumin 3.9 3.5 - 5.0 g/dL   AST 838 (H) 15 - 41 U/L   ALT 87 (H) 0 - 44 U/L   Alkaline Phosphatase 86 38 - 126 U/L   Total Bilirubin 0.8 0.0 - 1.2 mg/dL   GFR, Estimated >39 >39 mL/min   Anion gap 8 5 - 15  CBC with Differential (Cancer Center Only)  Result Value Ref Range   WBC Count 5.4 4.0 - 10.5 K/uL   RBC 3.06 (L) 3.87 - 5.11 MIL/uL   Hemoglobin 8.4 (L) 12.0 - 15.0 g/dL   HCT 75.0 (L) 63.9 - 53.9 %   MCV 81.4 80.0 - 100.0 fL   MCH 27.5 26.0 - 34.0 pg   MCHC 33.7 30.0 - 36.0 g/dL   RDW 80.8 (H) 88.4 - 84.4 %   Platelet Count 290 150 - 400 K/uL   nRBC 0.0 0.0 - 0.2 %   Neutrophils Relative % 44 %   Neutro Abs 2.4 1.7 - 7.7 K/uL   Lymphocytes Relative 33 %   Lymphs Abs 1.8 0.7 - 4.0 K/uL   Monocytes Relative 10 %   Monocytes Absolute 0.5 0.1 - 1.0 K/uL   Eosinophils Relative 10 %   Eosinophils Absolute 0.6 (H) 0.0 - 0.5 K/uL   Basophils Relative 2 %   Basophils Absolute 0.1 0.0 - 0.1 K/uL   Immature Granulocytes 1 %   Abs Immature Granulocytes 0.06 0.00 - 0.07 K/uL  Pregnancy, urine  Result Value Ref Range   Preg Test, Ur NEGATIVE NEGATIVE     RADIOGRAPHIC STUDIES:  Reviewed MRI from 12/14/2023 which showed stable disease.  CODE STATUS:  Code Status History     Date Active Date Inactive Code Status Order ID Comments User Context   08/29/2023 0409 08/31/2023 1925 Full Code [id number]  Shona Terry SAILOR, DO ED   08/21/2023 0400 08/23/2023 2127 Full Code [id number]  Lee Kingfisher, MD ED    Questions for Most Recent Historical Code Status (Order [id number])      Question Answer   By: Consent: discussion documented in EHR             This document was completed utilizing speech recognition software. Grammatical errors, random word insertions, pronoun errors, and incomplete sentences are an occasional consequence of this system due to software limitations, ambient noise, and hardware issues. Any formal questions or concerns about the content, text or information contained within the body of this dictation should be directly addressed to the provider for clarification.

## 2024-01-15 NOTE — Assessment & Plan Note (Signed)
 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   She is currently on tenofovir.  Follows up with Dr. Daiva Eves at ID clinic.  - Monitor liver function tests with each visit and ammonia levels as indicated

## 2024-01-16 ENCOUNTER — Other Ambulatory Visit: Payer: Self-pay

## 2024-01-16 ENCOUNTER — Other Ambulatory Visit (HOSPITAL_COMMUNITY): Payer: Self-pay

## 2024-01-16 LAB — T4: T4, Total: 12.7 ug/dL — ABNORMAL HIGH (ref 4.5–12.0)

## 2024-01-18 ENCOUNTER — Other Ambulatory Visit: Payer: Self-pay

## 2024-01-18 ENCOUNTER — Encounter (HOSPITAL_COMMUNITY): Payer: Self-pay | Admitting: Emergency Medicine

## 2024-01-18 ENCOUNTER — Emergency Department (HOSPITAL_COMMUNITY): Payer: Self-pay

## 2024-01-18 ENCOUNTER — Emergency Department (HOSPITAL_COMMUNITY)
Admission: EM | Admit: 2024-01-18 | Discharge: 2024-01-18 | Disposition: A | Discharge status: Home / Self Care | Payer: Self-pay | Attending: Emergency Medicine | Admitting: Emergency Medicine

## 2024-01-18 DIAGNOSIS — R748 Abnormal levels of other serum enzymes: Secondary | ICD-10-CM | POA: Insufficient documentation

## 2024-01-18 DIAGNOSIS — R1013 Epigastric pain: Secondary | ICD-10-CM | POA: Insufficient documentation

## 2024-01-18 DIAGNOSIS — R110 Nausea: Secondary | ICD-10-CM | POA: Insufficient documentation

## 2024-01-18 LAB — COMPREHENSIVE METABOLIC PANEL WITH GFR
ALT: 110 U/L — ABNORMAL HIGH (ref 0–44)
AST: 216 U/L — ABNORMAL HIGH (ref 15–41)
Albumin: 3.4 g/dL — ABNORMAL LOW (ref 3.5–5.0)
Alkaline Phosphatase: 101 U/L (ref 38–126)
Anion gap: 11 (ref 5–15)
BUN: 8 mg/dL (ref 6–20)
CO2: 22 mmol/L (ref 22–32)
Calcium: 9.2 mg/dL (ref 8.9–10.3)
Chloride: 102 mmol/L (ref 98–111)
Creatinine, Ser: 0.6 mg/dL (ref 0.44–1.00)
GFR, Estimated: >60 mL/min (ref >60)
Glucose, Bld: 109 mg/dL — ABNORMAL HIGH (ref 70–99)
Potassium: 4.5 mmol/L (ref 3.5–5.1)
Sodium: 135 mmol/L (ref 135–145)
Total Bilirubin: 0.7 mg/dL (ref 0.0–1.2)
Total Protein: 8.6 g/dL — ABNORMAL HIGH (ref 6.5–8.1)

## 2024-01-18 LAB — CBC
HCT: 25.1 % — ABNORMAL LOW (ref 36.0–46.0)
Hemoglobin: 8.2 g/dL — ABNORMAL LOW (ref 12.0–15.0)
MCH: 27.9 pg (ref 26.0–34.0)
MCHC: 32.7 g/dL (ref 30.0–36.0)
MCV: 85.4 fL (ref 80.0–100.0)
Platelets: 300 K/uL (ref 150–400)
RBC: 2.94 MIL/uL — ABNORMAL LOW (ref 3.87–5.11)
RDW: 19.4 % — ABNORMAL HIGH (ref 11.5–15.5)
WBC: 6.2 K/uL (ref 4.0–10.5)
nRBC: 0 % (ref 0.0–0.2)

## 2024-01-18 LAB — HCG, SERUM, QUALITATIVE: Preg, Serum: NEGATIVE

## 2024-01-18 LAB — LIPASE, BLOOD: Lipase: 60 U/L — ABNORMAL HIGH (ref 11–51)

## 2024-01-18 LAB — TROPONIN I (HIGH SENSITIVITY): Troponin I (High Sensitivity): 5 ng/L (ref <18)

## 2024-01-18 MED ORDER — IOHEXOL 350 MG/ML SOLN
100.0000 mL | Freq: Once | INTRAVENOUS | Status: AC | PRN
  Administered 2024-01-18: 100 mL via INTRAVENOUS

## 2024-01-18 MED ORDER — ONDANSETRON HCL 4 MG/2ML IJ SOLN
4.0000 mg | Freq: Once | INTRAMUSCULAR | Status: AC
  Administered 2024-01-18: 4 mg via INTRAVENOUS
  Filled 2024-01-18: qty 2

## 2024-01-18 MED ORDER — HYDROMORPHONE HCL 1 MG/ML IJ SOLN
1.0000 mg | Freq: Once | INTRAMUSCULAR | Status: AC
  Administered 2024-01-18: 1 mg via INTRAVENOUS
  Filled 2024-01-18: qty 1

## 2024-01-18 NOTE — Discharge Instructions (Addendum)
 You were evaluated in the Emergency Department and after careful evaluation, we did not find any emergent condition requiring admission or further testing in the hospital.  Your exam/testing today was overall reassuring.  Symptoms may be due to enteritis due to a virus or due to stones in your gallbladder.  Recommend discussing your symptoms and the CT findings with your oncology team.  Also recommend follow-up with the general surgery team regarding your gallbladder.  Please return to the Emergency Department if you experience any worsening of your condition.  Thank you for allowing us  to be a part of your care.

## 2024-01-18 NOTE — ED Provider Notes (Signed)
 [facility] Emergency Department Provider Note MRN:  [id number]  Arrival date & time: 01/18/24     Chief Complaint   Abdominal Pain   History of Present Illness   Christina Gutierrez is a 48 y.o. year-old female with a history of hepatocellular carcinoma presenting to the ED with chief complaint of abdominal pain.  Acute upper abdominal pain starting at 1 AM.  Associated with nausea.  Pain radiates into the chest as well.  Review of Systems  A thorough review of systems was obtained and all systems are negative except as noted in the HPI and PMH.   Patient's Health History    Past Medical History:  Diagnosis Date   Metabolic acidosis 08/21/2023    Past Surgical History:  Procedure Laterality Date   CESAREAN SECTION     IR IMAGING GUIDED PORT INSERTION  09/22/2023    History reviewed. No pertinent family history.  Social History   Socioeconomic History   Marital status: Married    Spouse name: Not on file   Number of children: Not on file   Years of education: Not on file   Highest education level: Not on file  Occupational History   Not on file  Tobacco Use   Smoking status: Never   Smokeless tobacco: Never  Vaping Use   Vaping status: Never Used  Substance and Sexual Activity   Alcohol use: No   Drug use: No   Sexual activity: Not on file  Other Topics Concern   Not on file  Social History Narrative   Not on file   Social Drivers of Health   Financial Resource Strain: Not on file  Food Insecurity: No Food Insecurity (08/29/2023)   Hunger Vital Sign    Worried About Running Out of Food in the Last Year: Never true    Ran Out of Food in the Last Year: Never true  Transportation Needs: No Transportation Needs (08/29/2023)   PRAPARE - Administrator, Civil Service (Medical): No    Lack of Transportation (Non-Medical): No  Physical Activity: Not on file  Stress: Not on file  Social Connections: Patient Declined  (08/29/2023)   Social Connection and Isolation Panel    Frequency of Communication with Friends and Family: Patient declined    Frequency of Social Gatherings with Friends and Family: Patient declined    Attends Religious Services: Patient declined    Database administrator or Organizations: Patient declined    Attends Banker Meetings: Patient declined    Marital Status: Patient declined  Intimate Partner Violence: Patient Declined (08/29/2023)   Humiliation, Afraid, Rape, and Kick questionnaire    Fear of Current or Ex-Partner: Patient declined    Emotionally Abused: Patient declined    Physically Abused: Patient declined    Sexually Abused: Patient declined     Physical Exam   Vitals:   01/18/24 0210  BP: 131/81  Pulse: 77  Resp: 16  Temp: 97.6 F (36.4 C)  SpO2: 98%    CONSTITUTIONAL: Well-appearing, moderate distress due to pain NEURO/PSYCH:  Alert and oriented x 3, no focal deficits EYES:  eyes equal and reactive ENT/NECK:  no LAD, no JVD CARDIO: Regular rate, well-perfused, normal S1 and S2 PULM:  CTAB no wheezing or rhonchi GI/GU:  non-distended, moderate epigastric tenderness to palpation MSK/SPINE:  No gross deformities, no edema SKIN:  no rash, atraumatic   *Additional and/or pertinent findings included in MDM below  Diagnostic and Interventional Summary  EKG Interpretation Date/Time:  January 18, 2024 at 02:37:47 Ventricular Rate:  72  PR Interval:   192 QRS Duration:   99 QT Interval:   402 QTC Calculation:  440 R Axis:      Text Interpretation: Sinus rhythm without concerning ischemic findings       Labs Reviewed  LIPASE, BLOOD - Abnormal; Notable for the following components:      Result Value   Lipase 60 (*)    All other components within normal limits  COMPREHENSIVE METABOLIC PANEL WITH GFR - Abnormal; Notable for the following components:   Glucose, Bld 109 (*)    Total Protein 8.6 (*)    Albumin 3.4 (*)    AST 216 (*)     ALT 110 (*)    All other components within normal limits  CBC - Abnormal; Notable for the following components:   RBC 2.94 (*)    Hemoglobin 8.2 (*)    HCT 25.1 (*)    RDW 19.4 (*)    All other components within normal limits  HCG, SERUM, QUALITATIVE  URINALYSIS, ROUTINE W REFLEX MICROSCOPIC  TROPONIN I (HIGH SENSITIVITY)  TROPONIN I (HIGH SENSITIVITY)    CT ABDOMEN PELVIS W CONTRAST  Final Result    CT Angio Chest Pulmonary Embolism (PE) W or WO Contrast  Final Result      Medications  ondansetron  (ZOFRAN ) injection 4 mg (4 mg Intravenous Given 01/18/24 0235)  HYDROmorphone  (DILAUDID ) injection 1 mg (1 mg Intravenous Given 01/18/24 0236)  iohexol  (OMNIPAQUE ) 350 MG/ML injection 100 mL (100 mLs Intravenous Contrast Given 01/18/24 0344)     Procedures  /  Critical Care Procedures  ED Course and Medical Decision Making  Initial Impression and Ddx Concern for intra-abdominal complication of cancer versus PE versus pancreatitis versus cholecystitis versus perforated viscus.  Past medical/surgical history that increases complexity of ED encounter: Hepatocellular carcinoma  Interpretation of Diagnostics I personally reviewed the EKG and my interpretation is as follows: Sinus rhythm without significant change from prior  No significant blood count or electrolyte disturbance.  Lipase minimally elevated unclear significance.  Troponin negative  Patient Reassessment and Ultimate Disposition/Management     CT imaging largely without acute process.  Cholelithiasis but no evidence of cholecystitis.  Possibly some signs of enteritis.  May be some mild progression of her cancer.  On reassessment patient is feeling much better, symptoms are resolved, vitals are normal.  Abdomen is soft, has no McBurney's point tenderness.  Highly doubt cholecystitis at this time, may have had an episode of biliary colic or was symptomatic from the enteritis.  Overall doubt cardiopulmonary cause, no evidence  of PE.  Troponin is negative and pain started at 10 PM, no need for second troponin.  Patient is appropriate for discharge with follow-up.  Patient management required discussion with the following services or consulting groups:  None  Complexity of Problems Addressed Acute illness or injury that poses threat of life of bodily function  Additional Data Reviewed and Analyzed Further history obtained from: Further history from spouse/family member  Additional Factors Impacting ED Encounter Risk Use of parenteral controlled substances and Consideration of hospitalization  Ozell HERO. Theadore, MD [facility] Emergency Medicine [facility] [email or web address]  Final Clinical Impressions(s) / ED Diagnoses     ICD-10-CM   1. Epigastric pain  R10.13       ED Discharge Orders     None        Discharge Instructions Discussed with and Provided  to Patient:     Discharge Instructions      You were evaluated in the Emergency Department and after careful evaluation, we did not find any emergent condition requiring admission or further testing in the hospital.  Your exam/testing today was overall reassuring.  Symptoms may be due to enteritis due to a virus or due to stones in your gallbladder.  Recommend discussing your symptoms and the CT findings with your oncology team.  Also recommend follow-up with the general surgery team regarding your gallbladder.  Please return to the Emergency Department if you experience any worsening of your condition.  Thank you for allowing us  to be a part of your care.        Theadore Ozell HERO, MD 01/18/24 [phone]

## 2024-01-18 NOTE — Progress Notes (Signed)
 Specialty Pharmacy Refill Coordination Note  Christina Gutierrez is a 48 y.o. female contacted today regarding refills of specialty medication(s) Tenofovir  Alafenamide Fumarate (VEMLIDY )   Patient requested Pickup at [facility] at [place] date: 01/20/24   Medication will be filled on 01/19/24.

## 2024-01-18 NOTE — ED Triage Notes (Signed)
 Pt is currently undergoing treatment for cancer.  Tonight began having diffuse mid epigastric pain.  No n/v/d.  No fever or chills.

## 2024-01-19 ENCOUNTER — Other Ambulatory Visit: Payer: Self-pay

## 2024-01-21 ENCOUNTER — Other Ambulatory Visit: Payer: Self-pay

## 2024-02-05 ENCOUNTER — Other Ambulatory Visit (HOSPITAL_COMMUNITY): Payer: Self-pay

## 2024-02-05 ENCOUNTER — Inpatient Hospital Stay (HOSPITAL_BASED_OUTPATIENT_CLINIC_OR_DEPARTMENT_OTHER): Payer: Self-pay | Attending: Oncology | Admitting: Nurse Practitioner

## 2024-02-05 ENCOUNTER — Encounter: Payer: Self-pay | Admitting: Oncology

## 2024-02-05 ENCOUNTER — Inpatient Hospital Stay: Payer: Self-pay | Attending: Oncology

## 2024-02-05 ENCOUNTER — Inpatient Hospital Stay: Payer: Self-pay

## 2024-02-05 ENCOUNTER — Other Ambulatory Visit: Payer: Self-pay

## 2024-02-05 VITALS — BP 104/64 | HR 68 | Resp 16

## 2024-02-05 VITALS — BP 94/68 | HR 72 | Temp 98.3°F | Resp 17 | Ht 60.0 in | Wt 112.0 lb

## 2024-02-05 DIAGNOSIS — Z5112 Encounter for antineoplastic immunotherapy: Secondary | ICD-10-CM | POA: Insufficient documentation

## 2024-02-05 DIAGNOSIS — F5109 Other insomnia not due to a substance or known physiological condition: Secondary | ICD-10-CM

## 2024-02-05 DIAGNOSIS — C220 Liver cell carcinoma: Secondary | ICD-10-CM | POA: Insufficient documentation

## 2024-02-05 DIAGNOSIS — G4700 Insomnia, unspecified: Secondary | ICD-10-CM | POA: Insufficient documentation

## 2024-02-05 DIAGNOSIS — D649 Anemia, unspecified: Secondary | ICD-10-CM | POA: Insufficient documentation

## 2024-02-05 DIAGNOSIS — K068 Other specified disorders of gingiva and edentulous alveolar ridge: Secondary | ICD-10-CM | POA: Insufficient documentation

## 2024-02-05 DIAGNOSIS — R7989 Other specified abnormal findings of blood chemistry: Secondary | ICD-10-CM | POA: Insufficient documentation

## 2024-02-05 DIAGNOSIS — Z79624 Long term (current) use of inhibitors of nucleotide synthesis: Secondary | ICD-10-CM | POA: Insufficient documentation

## 2024-02-05 DIAGNOSIS — Z79899 Other long term (current) drug therapy: Secondary | ICD-10-CM | POA: Insufficient documentation

## 2024-02-05 DIAGNOSIS — Z95828 Presence of other vascular implants and grafts: Secondary | ICD-10-CM

## 2024-02-05 DIAGNOSIS — G893 Neoplasm related pain (acute) (chronic): Secondary | ICD-10-CM

## 2024-02-05 LAB — CBC WITH DIFFERENTIAL (CANCER CENTER ONLY)
Abs Immature Granulocytes: 0.01 K/uL (ref 0.00–0.07)
Basophils Absolute: 0.1 K/uL (ref 0.0–0.1)
Basophils Relative: 2 %
Eosinophils Absolute: 0.7 K/uL — ABNORMAL HIGH (ref 0.0–0.5)
Eosinophils Relative: 12 %
HCT: 25.1 % — ABNORMAL LOW (ref 36.0–46.0)
Hemoglobin: 8.3 g/dL — ABNORMAL LOW (ref 12.0–15.0)
Immature Granulocytes: 0 %
Lymphocytes Relative: 27 %
Lymphs Abs: 1.6 K/uL (ref 0.7–4.0)
MCH: 26.9 pg (ref 26.0–34.0)
MCHC: 33.1 g/dL (ref 30.0–36.0)
MCV: 81.2 fL (ref 80.0–100.0)
Monocytes Absolute: 0.5 K/uL (ref 0.1–1.0)
Monocytes Relative: 9 %
Neutro Abs: 2.8 K/uL (ref 1.7–7.7)
Neutrophils Relative %: 50 %
Platelet Count: 330 K/uL (ref 150–400)
RBC: 3.09 MIL/uL — ABNORMAL LOW (ref 3.87–5.11)
RDW: 20.3 % — ABNORMAL HIGH (ref 11.5–15.5)
WBC Count: 5.7 K/uL (ref 4.0–10.5)
nRBC: 0 % (ref 0.0–0.2)

## 2024-02-05 LAB — CMP (CANCER CENTER ONLY)
ALT: 95 U/L — ABNORMAL HIGH (ref 0–44)
AST: 186 U/L (ref 15–41)
Albumin: 4 g/dL (ref 3.5–5.0)
Alkaline Phosphatase: 98 U/L (ref 38–126)
Anion gap: 5 (ref 5–15)
BUN: 11 mg/dL (ref 6–20)
CO2: 26 mmol/L (ref 22–32)
Calcium: 9.5 mg/dL (ref 8.9–10.3)
Chloride: 103 mmol/L (ref 98–111)
Creatinine: 0.6 mg/dL (ref 0.44–1.00)
GFR, Estimated: >60 mL/min (ref >60)
Glucose, Bld: 79 mg/dL (ref 70–99)
Potassium: 3.8 mmol/L (ref 3.5–5.1)
Sodium: 134 mmol/L — ABNORMAL LOW (ref 135–145)
Total Bilirubin: 0.9 mg/dL (ref 0.0–1.2)
Total Protein: 8.8 g/dL — ABNORMAL HIGH (ref 6.5–8.1)

## 2024-02-05 LAB — TSH: TSH: 4.12 u[IU]/mL (ref 0.350–4.500)

## 2024-02-05 LAB — TOTAL PROTEIN, URINE DIPSTICK: Protein, ur: NEGATIVE mg/dL

## 2024-02-05 MED ORDER — SODIUM CHLORIDE 0.9% FLUSH
10.0000 mL | Freq: Once | INTRAVENOUS | Status: AC
Start: 2024-02-05 — End: 2024-02-05
  Administered 2024-02-05: 10 mL

## 2024-02-05 MED ORDER — SODIUM CHLORIDE 0.9 % IV SOLN: INTRAVENOUS | Status: DC

## 2024-02-05 MED ORDER — TRAMADOL HCL 50 MG PO TABS
50.0000 mg | ORAL_TABLET | Freq: Four times a day (QID) | ORAL | 0 refills | Status: DC | PRN
  Filled 2024-02-05: qty 50, 13d supply, fill #0

## 2024-02-05 MED ORDER — HEPARIN SOD (PORK) LOCK FLUSH 100 UNIT/ML IV SOLN
500.0000 [IU] | Freq: Once | INTRAVENOUS | Status: AC | PRN
  Administered 2024-02-05: 500 [IU]

## 2024-02-05 MED ORDER — ALPRAZOLAM 0.5 MG PO TABS
0.5000 mg | ORAL_TABLET | Freq: Every evening | ORAL | 0 refills | Status: DC | PRN
  Filled 2024-02-05 – 2024-02-22 (×4): qty 30, 30d supply, fill #0

## 2024-02-05 MED ORDER — SODIUM CHLORIDE 0.9% FLUSH
10.0000 mL | INTRAVENOUS | Status: DC | PRN
  Administered 2024-02-05: 10 mL

## 2024-02-05 MED ORDER — SODIUM CHLORIDE 0.9 % IV SOLN
1200.0000 mg | Freq: Once | INTRAVENOUS | Status: AC
  Administered 2024-02-05: 1200 mg via INTRAVENOUS
  Filled 2024-02-05: qty 20

## 2024-02-05 NOTE — Progress Notes (Signed)
 Patient Care Team: [name], MD as PCP - General (Oncology)  Clinic Day:  02/05/2024  Referring physician: [name], MD  ASSESSMENT & PLAN:   Assessment & Plan: Hepatocellular carcinoma [facility]) Please review oncology history for additional details and timeline of events. MRI of the abdomen with liver protocol on 08/21/2023 showed numerous hepatic lesions are noted, with a dominant lesion which replaces much of the right lobe of the liver estimated to measure approximately 16.5 x 12.7 x 17.7 cm. This lesion is heterogeneous in signal intensity on T1 and T2 weighted images, but clearly has internal areas of hypervascular enhancement on early phase post gadolinium imaging, with persistent low-level enhancement on more delayed imaging. The other dominant lesion is centered in the superior aspect of the liver, likely within the superior aspect of segment 4A estimated to measure approximately 8.1 x 6.8 x 7.8 cm, with similar imaging characteristics to the previously described lesion, although with greater degree of internal washout and probable pseudo capsule on delayed imaging. Multiple other smaller hypervascular lesions are apparent on arterial phase imaging throughout all aspects of the liver, some of which demonstrate delayed washout.  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  This was confirmed during GI tumor conference on 09/09/2023. -Previously undiagnosed hepatitis B could have been the contributing factor. -Not a candidate for liver transplant or surgical resection because of the extent of disease. -Discussed diagnosis, prognosis, plan of care, treatment options.  Reviewed NCCN guidelines.  On 08/21/2023, AFP was significantly elevated at 32,160 ng/mL.    CT chest on 08/23/2023 showed no evidence of intrathoracic metastatic disease. 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 Plan made for systemic treatments with atezolizumab  plus bevacizumab .  Started this from 09/11/2023.   She has been tolerating treatments well without any major side effects. Restaging MRI of the liver on 12/14/2023 showed stable disease without evidence of disease progression or new disease.  Plan to continue current management. However given progressive gum bleeding, we will hold bevacizumab  to see if that would help improve her symptoms. Labs today reveal no dose-limiting toxicities.  Persistent elevated LFTs but improved compared to prior. No dose-limiting toxicities.   Will proceed with cycle 8 of atezolizumab  today and continue to hold bevacizumab  with current cycle to see if that will help with her gum bleeding.  -- She has seen her dentist since last visit, and they have discussed following several teeth due to periodontal disease.  Will give dental clearance forms to Dr. Autumn when he returns. - 01/18/2024 - ED visit due to epigastric abdominal pain.  CT scan of her abdomen and pelvis during that visit.  She does have 2 dominant enhancing masses in the right hepatic lobe measuring up to 15.7 cm.  There has been interval development and enlargement of multiple additional hypoenhancing nodules within the left hepatic lobe of the liver, consistent with disease progression.  She was noted to have cholelithiasis.  She also had acute enteritis.  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 - Schedule follow-up scan in mid-August to assess disease stability  - RTC in 3 weeks for labs, follow-up, cycle 9 of treatment.  - Switch to durvalumab or other agent if disease shows signs of worsening   Elevated liver functions Today, AST is 186, down from 216.  ALT is 95, down from 110.  ALKP is normal.  Total bilirubin also normal.  Discussed with Dr. Lanny.  High liver functions are likely due to  disease process, hepatocellular carcinoma.  Overall, they are improving.  Okay to proceed with treatment of atezolizumab  and bevacizumab  today without dose  adjustments.  Abdominal pain Well-managed with current prescription for tramadol .  Patient taking rarely.  She does need a refill and will send to [facility].  Insomnia Patient prescribed alprazolam  0.5 mg at bedtime to help with anxiety related insomnia.  A refill will be sent to [facility]  Gum bleeding Bevacizumab  held since cycle 7 due to bleeding gums.  Will continue to hold with today's cycle #8.  She has seen her dentist and they are wanting to pull several teeth due to her extensive periodontal disease.  Clearance forms will be given to Dr.Pasam to determine eligibility.  Appointment scheduled with dentist in October 2025.  Plan Labs reviewed. -Stable anemia with Hgb 8.3 and HCT 25.1.  Continue to monitor levels closely and treat severe iron deficiency as indicated. - CMP with elevated but improving liver functions.  Continue to check with every visit and adjust treatment as indicated. Send refills for tramadol  and alprazolam  to [facility]. Will get dental clearance form to Dr. Autumn provide return to complete and fax to patient's dentist. Labs and patient presentation are appropriate for treatment today. Proceed with cycle 8 of atezolizumab , holding bevacizumab  since cycle #7 due to extensive gum bleeding. Labs/flush, follow-up, and cycle 9 of atezolizumab  and bevacizumab  as scheduled.   The patient understands the plans discussed today and is in agreement with them.  She knows to contact our office if she develops concerns prior to her next appointment.  I provided 30 minutes of face-to-face time during this encounter and > 50% was spent counseling as documented under my assessment and plan.    Powell FORBES Lessen, NP  [facility] - A DEPT OF [facility] [address] [place] [address] Dept: [phone] Dept Fax: [phone]   No orders of the defined types were  placed in this encounter.     CHIEF COMPLAINT:  CC: Hepatocellular carcinoma  Current Treatment: Atezolizumab  and bevacizumab  (bevacizumab  held cycle 7 and cycle 8 due to bleeding gums)  INTERVAL HISTORY:  Christina Gutierrez is here today for repeat clinical assessment. She was last seen by Dr. Autumn 01/15/2024. She presents for cycle 8 atezolizumab  and bevacizumab  today.  Has experienced some acid reflux and recently started on pantoprazole  which helps.  She did have a trip to the emergency department on 01/18/2024 due to epigastric abdominal pain.  She had a CT scan of her abdomen and pelvis during that visit.  She does have 2 dominant enhancing masses in the right hepatic lobe measuring up to 15.7 cm.  There has been interval development and enlargement of multiple additional hypoenhancing nodules within the left hepatic lobe of the liver, consistent with disease progression.  She was noted to have cholelithiasis.  She also had acute enteritis.  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  Will take tramadol  as needed for abdominal pain.  States she is taken 3 times since given initial prescription.  She states she does need new prescription for this today.  She also needs refill for her alprazolam  which is prescribed to help her sleep.  She denies chest pain, chest pressure, or shortness of breath. She denies headaches or visual disturbances. She has intermittent abdominal pain, nausea, vomiting, or changes in bowel or bladder habits.  She has moderate constipation.  Takes MiraLAX  daily.   She denies fevers or chills. She denies pain. Her appetite is good. Her weight has been stable.  I have reviewed the past medical history, past surgical history, social history and family history with the patient and they are unchanged from previous note.  ALLERGIES:  has no known allergies.  MEDICATIONS:  Current Outpatient Medications  Medication Sig Dispense  Refill   ALPRAZolam  (XANAX ) 0.5 MG tablet Take 1 tablet (0.5 mg total) by mouth at bedtime as needed for anxiety. 30 tablet 0   lidocaine -prilocaine  (EMLA ) cream Apply 1 Application topically as needed. 30 g 3   ondansetron  (ZOFRAN ) 8 MG tablet Take 1 tablet (8 mg total) by mouth every 8 (eight) hours as needed for nausea or vomiting. 60 tablet 2   pantoprazole  (PROTONIX ) 40 MG tablet Take 1 tablet (40 mg total) by mouth daily at 6 (six) AM. 30 tablet 1   phytonadione  (VITAMIN K) 5 MG tablet Take 2 tablets (10 mg total) by mouth daily. 6 tablet 0   prochlorperazine  (COMPAZINE ) 10 MG tablet TAKE 1 TABLET(10 MG) BY MOUTH EVERY 6 HOURS AS NEEDED FOR NAUSEA OR VOMITING 30 tablet 1   tenofovir  alafenamide (VEMLIDY ) 25 MG tablet Take 1 tablet (25 mg total) by mouth daily. 30 tablet 11   traMADol  (ULTRAM ) 50 MG tablet Take 1 tablet (50 mg total) by mouth every 6 (six) hours as needed for severe pain (pain score 7-10) or moderate pain (pain score 4-6). 50 tablet 0   No current facility-administered medications for this visit.    HISTORY OF PRESENT ILLNESS:   Oncology History  Hepatocellular carcinoma (HCC)  08/22/2023 Initial Diagnosis   Hepatocellular carcinoma (HCC)   08/27/2023 Cancer Staging   Staging form: Liver, AJCC 8th Edition - Clinical: Stage IIIA (cT3, cN0, cM0) - Signed by [name], MD on 08/27/2023   09/11/2023 -  Chemotherapy   Patient is on Treatment Plan : HEPATOCELLULAR Atezolizumab  + Bevacizumab  q21d         REVIEW OF SYSTEMS:   Constitutional: Denies fevers, chills or abnormal weight loss Eyes: Denies blurriness of vision Ears, nose, mouth, throat, and face: Denies mucositis or sore throat Respiratory: Denies cough, dyspnea or wheezes Cardiovascular: Denies palpitation, chest discomfort or lower extremity swelling Gastrointestinal:  Denies nausea, heartburn or change in bowel habits.  Has mild intermittent abdominal pain.  Also has moderate constipation. Skin: Denies  abnormal skin rashes Lymphatics: Denies new lymphadenopathy or easy bruising Neurological:Denies numbness, tingling or new weaknesses Behavioral/Psych: Mood is stable, no new changes  All other systems were reviewed with the patient and are negative.   VITALS:   Today's Vitals   02/05/24 1039 02/05/24 1044  BP: 94/68   Pulse: 72   Resp: 17   Temp: 98.3 F (36.8 C)   TempSrc: Temporal   SpO2: 96%   Weight: 112 lb (50.8 kg)   Height: 5' (1.524 m)   PainSc:  0-No pain   Body mass index is 21.87 kg/m.   Wt Readings from Last 3 Encounters:  02/05/24 112 lb (50.8 kg)  01/15/24 113 lb 3.2 oz (51.3 kg)  12/25/23 115 lb 8 oz (52.4 kg)    Body mass index is 21.87 kg/m.  Performance status (ECOG): 1 - Symptomatic but completely ambulatory  PHYSICAL EXAM:   GENERAL:alert, no distress and comfortable SKIN: skin color, texture, turgor are normal, no rashes or significant lesions EYES: normal, Conjunctiva are pink and non-injected, sclera clear OROPHARYNX:no  exudate, no erythema and lips, buccal mucosa, and tongue normal  NECK: supple, thyroid  normal size, non-tender, without nodularity LYMPH:  no palpable lymphadenopathy in the cervical, axillary or inguinal LUNGS: clear to auscultation and percussion with normal breathing effort HEART: regular rate & rhythm and no murmurs and no lower extremity edema ABDOMEN: Abdomen is soft with normal bowel sounds.  There is tenderness with palpation of the right upper quadrant and epigastric areas of the belly. Musculoskeletal:no cyanosis of digits and no clubbing  NEURO: alert & oriented x 3 with fluent speech, no focal motor/sensory deficits  LABORATORY DATA:  I have reviewed the data as listed    Component Value Date/Time   NA 134 (L) 02/05/2024 0949   K 3.8 02/05/2024 0949   CL 103 02/05/2024 0949   CO2 26 02/05/2024 0949   GLUCOSE 79 02/05/2024 0949   BUN 11 02/05/2024 0949   CREATININE 0.60 02/05/2024 0949   CALCIUM 9.5  02/05/2024 0949   PROT 8.8 (H) 02/05/2024 0949   ALBUMIN 4.0 02/05/2024 0949   AST 186 (HH) 02/05/2024 0949   ALT 95 (H) 02/05/2024 0949   ALKPHOS 98 02/05/2024 0949   BILITOT 0.9 02/05/2024 0949   GFRNONAA >60 02/05/2024 0949   GFRAA >60 02/26/2018 0914    Lab Results  Component Value Date   WBC 5.7 02/05/2024   NEUTROABS 2.8 02/05/2024   HGB 8.3 (L) 02/05/2024   HCT 25.1 (L) 02/05/2024   MCV 81.2 02/05/2024   PLT 330 02/05/2024    RADIOGRAPHIC STUDIES: CT ABDOMEN PELVIS W CONTRAST Result Date: 01/18/2024 CLINICAL DATA:  Pulmonary embolism (PE) suspected, high prob; Abdominal pain, acute, nonlocalized EXAM: CT ANGIOGRAPHY CHEST CT ABDOMEN AND PELVIS WITH CONTRAST TECHNIQUE: Multidetector CT imaging of the chest was performed using the standard protocol during bolus administration of intravenous contrast. Multiplanar CT image reconstructions and MIPs were obtained to evaluate the vascular anatomy. Multidetector CT imaging of the abdomen and pelvis was performed using the standard protocol during bolus administration of intravenous contrast. RADIATION DOSE REDUCTION: This exam was performed according to the departmental dose-optimization program which includes automated exposure control, adjustment of the mA and/or kV according to patient size and/or use of iterative reconstruction technique. CONTRAST:  OMNIPAQUE  IOHEXOL  350 MG/ML SOLN COMPARISON:  08/21/2023 FINDINGS: CTA CHEST FINDINGS Cardiovascular: Adequate opacification of the pulmonary arterial tree. No intraluminal filling defect identified to suggest acute pulmonary embolism. Central pulmonary arteries are of normal caliber. No significant coronary artery calcification. Cardiac size is mildly enlarged. Right internal jugular chest port tip is seen within the inferior cavoatrial junction. No pericardial effusion. No significant atherosclerotic calcification within the thoracic aorta. No aortic aneurysm. Mediastinum/Nodes: No  enlarged mediastinal, hilar, or axillary lymph nodes. Thyroid  gland, trachea, and esophagus demonstrate no significant findings. Lungs/Pleura: Lungs are clear. No pleural effusion or pneumothorax. Musculoskeletal: No chest wall abnormality. No acute or significant osseous findings. Review of the MIP images confirms the above findings. CT ABDOMEN and PELVIS FINDINGS Hepatobiliary: 2 dominant heterogeneously enhancing masses are again identified within the right hepatic lobe measuring 7.3 x 9.3 cm (19/2) and 12.9 x 15.7 cm (34/2). While the lesions appear relatively stable since remote prior examination of 08/21/2023, there has been interval development as well as enlargement of multiple additional hypoenhancing nodules within the left hepatic lobe, by example a 2.7 cm nodule within segment 4 B (33/2) in keeping with disease progression. No intra or extrahepatic biliary ductal dilation. Portal vein is patent. Cholelithiasis noted without superimposed pericholecystic inflammatory  change. Pancreas: Unremarkable Spleen: Unremarkable Adrenals/Urinary Tract: Adrenal glands are unremarkable. Simple cortical cysts are seen within the right kidney for which no follow-up imaging is recommended. The kidneys are otherwise unremarkable. Bladder unremarkable. Stomach/Bowel: There are several mildly dilated fluid-filled loops of small bowel within the deep pelvis involving the terminal ileum without a focal point of transition identified which may reflect changes of a mild infectious or inflammatory enteritis. The stomach, small bowel, and large bowel are otherwise unremarkable. Appendix normal. No free intraperitoneal gas or fluid. Vascular/Lymphatic: There is significant mass effect upon the inferior vena cava by the dominant hepatic mass as well as upon the right portal vein. There are extensive venous varicosities within the left upper quadrant representing a splenorenal shunt. The abdominal vasculature is otherwise  unremarkable. Reproductive: Uterus and bilateral adnexa are unremarkable. Other: None Musculoskeletal: No acute or significant osseous findings. Review of the MIP images confirms the above findings. IMPRESSION: 1. No evidence of pulmonary embolism. 2. Mild cardiomegaly. 3. Two dominant heterogeneously enhancing masses within the right hepatic lobe measuring up to 15.7 cm. While the lesions appear relatively stable since remote prior examination of 08/21/2023, there has been interval development as well as enlargement of multiple additional hypoenhancing nodules within the left hepatic lobe in keeping with disease progression. These are best appreciated on the diffusion-weighted sequences of prior MRI examinations of 08/21/2023 and 12/14/2018. 4. Cholelithiasis. 5. Multiple mildly dilated fluid-filled loops of small bowel within the deep pelvis involving the terminal ileum without a focal point of transition identified which may reflect changes of a mild infectious or inflammatory enteritis. 6. Extensive venous varicosities within the left upper quadrant representing a splenorenal shunt. Electronically Signed   By: Dorethia Molt M.D.   On: 01/18/2024 04:17   CT Angio Chest Pulmonary Embolism (PE) W or WO Contrast Result Date: 01/18/2024 CLINICAL DATA:  Pulmonary embolism (PE) suspected, high prob; Abdominal pain, acute, nonlocalized EXAM: CT ANGIOGRAPHY CHEST CT ABDOMEN AND PELVIS WITH CONTRAST TECHNIQUE: Multidetector CT imaging of the chest was performed using the standard protocol during bolus administration of intravenous contrast. Multiplanar CT image reconstructions and MIPs were obtained to evaluate the vascular anatomy. Multidetector CT imaging of the abdomen and pelvis was performed using the standard protocol during bolus administration of intravenous contrast. RADIATION DOSE REDUCTION: This exam was performed according to the departmental dose-optimization program which includes automated exposure  control, adjustment of the mA and/or kV according to patient size and/or use of iterative reconstruction technique. CONTRAST:  OMNIPAQUE  IOHEXOL  350 MG/ML SOLN COMPARISON:  08/21/2023 FINDINGS: CTA CHEST FINDINGS Cardiovascular: Adequate opacification of the pulmonary arterial tree. No intraluminal filling defect identified to suggest acute pulmonary embolism. Central pulmonary arteries are of normal caliber. No significant coronary artery calcification. Cardiac size is mildly enlarged. Right internal jugular chest port tip is seen within the inferior cavoatrial junction. No pericardial effusion. No significant atherosclerotic calcification within the thoracic aorta. No aortic aneurysm. Mediastinum/Nodes: No enlarged mediastinal, hilar, or axillary lymph nodes. Thyroid  gland, trachea, and esophagus demonstrate no significant findings. Lungs/Pleura: Lungs are clear. No pleural effusion or pneumothorax. Musculoskeletal: No chest wall abnormality. No acute or significant osseous findings. Review of the MIP images confirms the above findings. CT ABDOMEN and PELVIS FINDINGS Hepatobiliary: 2 dominant heterogeneously enhancing masses are again identified within the right hepatic lobe measuring 7.3 x 9.3 cm (19/2) and 12.9 x 15.7 cm (34/2). While the lesions appear relatively stable since remote prior examination of 08/21/2023, there has been interval development as well  as enlargement of multiple additional hypoenhancing nodules within the left hepatic lobe, by example a 2.7 cm nodule within segment 4 B (33/2) in keeping with disease progression. No intra or extrahepatic biliary ductal dilation. Portal vein is patent. Cholelithiasis noted without superimposed pericholecystic inflammatory change. Pancreas: Unremarkable Spleen: Unremarkable Adrenals/Urinary Tract: Adrenal glands are unremarkable. Simple cortical cysts are seen within the right kidney for which no follow-up imaging is recommended. The kidneys are  otherwise unremarkable. Bladder unremarkable. Stomach/Bowel: There are several mildly dilated fluid-filled loops of small bowel within the deep pelvis involving the terminal ileum without a focal point of transition identified which may reflect changes of a mild infectious or inflammatory enteritis. The stomach, small bowel, and large bowel are otherwise unremarkable. Appendix normal. No free intraperitoneal gas or fluid. Vascular/Lymphatic: There is significant mass effect upon the inferior vena cava by the dominant hepatic mass as well as upon the right portal vein. There are extensive venous varicosities within the left upper quadrant representing a splenorenal shunt. The abdominal vasculature is otherwise unremarkable. Reproductive: Uterus and bilateral adnexa are unremarkable. Other: None Musculoskeletal: No acute or significant osseous findings. Review of the MIP images confirms the above findings. IMPRESSION: 1. No evidence of pulmonary embolism. 2. Mild cardiomegaly. 3. Two dominant heterogeneously enhancing masses within the right hepatic lobe measuring up to 15.7 cm. While the lesions appear relatively stable since remote prior examination of 08/21/2023, there has been interval development as well as enlargement of multiple additional hypoenhancing nodules within the left hepatic lobe in keeping with disease progression. These are best appreciated on the diffusion-weighted sequences of prior MRI examinations of 08/21/2023 and 12/14/2018. 4. Cholelithiasis. 5. Multiple mildly dilated fluid-filled loops of small bowel within the deep pelvis involving the terminal ileum without a focal point of transition identified which may reflect changes of a mild infectious or inflammatory enteritis. 6. Extensive venous varicosities within the left upper quadrant representing a splenorenal shunt. Electronically Signed   By: Dorethia Molt M.D.   On: 01/18/2024 04:17

## 2024-02-05 NOTE — Assessment & Plan Note (Addendum)
 Please review oncology history for additional details and timeline of events. MRI of the abdomen with liver protocol on 08/21/2023 showed numerous hepatic lesions are noted, with a dominant lesion which replaces much of the right lobe of the liver estimated to measure approximately 16.5 x 12.7 x 17.7 cm. This lesion is heterogeneous in signal intensity on T1 and T2 weighted images, but clearly has internal areas of hypervascular enhancement on early phase post gadolinium imaging, with persistent low-level enhancement on more delayed imaging. The other dominant lesion is centered in the superior aspect of the liver, likely within the superior aspect of segment 4A estimated to measure approximately 8.1 x 6.8 x 7.8 cm, with similar imaging characteristics to the previously described lesion, although with greater degree of internal washout and probable pseudo capsule on delayed imaging. Multiple other smaller hypervascular lesions are apparent on arterial phase imaging throughout all aspects of the liver, some of which demonstrate delayed washout.  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  This was confirmed during GI tumor conference on 09/09/2023. -Previously undiagnosed hepatitis B could have been the contributing factor. -Not a candidate for liver transplant or surgical resection because of the extent of disease. -Discussed diagnosis, prognosis, plan of care, treatment options.  Reviewed NCCN guidelines.  On 08/21/2023, AFP was significantly elevated at 32,160 ng/mL.    CT chest on 08/23/2023 showed no evidence of intrathoracic metastatic disease. 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 Plan made for systemic treatments with atezolizumab  plus bevacizumab .  Started this from 09/11/2023.  She has been tolerating treatments well without any major side effects. Restaging MRI of the liver on 12/14/2023 showed stable disease without evidence of disease progression or new disease.  Plan to continue  current management. However given progressive gum bleeding, we will hold bevacizumab  to see if that would help improve her symptoms. Labs today reveal no dose-limiting toxicities.  Persistent elevated LFTs but improved compared to prior. No dose-limiting toxicities.   Will proceed with cycle 8 of atezolizumab  today and continue to hold bevacizumab  with current cycle to see if that will help with her gum bleeding.  -- She has seen her dentist since last visit, and they have discussed following several teeth due to periodontal disease.  Will give dental clearance forms to Dr. Autumn when he returns. - 01/18/2024 - ED visit due to epigastric abdominal pain.  CT scan of her abdomen and pelvis during that visit.  She does have 2 dominant enhancing masses in the right hepatic lobe measuring up to 15.7 cm.  There has been interval development and enlargement of multiple additional hypoenhancing nodules within the left hepatic lobe of the liver, consistent with disease progression.  She was noted to have cholelithiasis.  She also had acute enteritis.  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 - Schedule follow-up scan in mid-August to assess disease stability  - RTC in 3 weeks for labs, follow-up, cycle 9 of treatment.  - Switch to durvalumab or other agent if disease shows signs of worsening

## 2024-02-05 NOTE — Progress Notes (Signed)
 CRITICAL VALUE STICKER  CRITICAL VALUE:  AST: [id number]  RECEIVER (on-site recipient of call):  Keene Crown, RN  DATE & TIME NOTIFIED:   02/05/2024  1119  MESSENGER (representative from lab):  Harlene  MD NOTIFIED:   Powell Lessen covering for Dr. Autumn  TIME OF NOTIFICATION:  1122  RESPONSE:  Aware

## 2024-02-05 NOTE — Patient Instructions (Signed)
 Instrucciones al darle de alta: Discharge Instructions Gracias por elegir al [facility] de Cncer de [place] para [place] atencin mdica de oncologa y Teacher, English as a foreign language.   Si usted tiene una cita de laboratorio con el Cent[facility] de Cncer, por favor vaya directamente al [employer] de Cncer y regstrese en el rea de Engineer, maintenance (IT).   Use ropa cmoda y [place] para tener fcil acceso a las vas del Portacath (acceso venoso de larga duracin) o la lnea PICC (catter central colocado por va perifrica).   Nos esforzamos por ofrecerle tiempo de calidad con su proveedor. Es posible que tenga que volver a programar su cita si llega tarde (15 minutos o ms).  El llegar tarde le afecta a usted y a otros pacientes cuyas citas son posteriores a Armed forces operational officer.  Adems, si usted falta a tres o ms citas sin avisar a la oficina, puede ser retirado(a) de la clnica a discrecin del proveedor.      Para las solicitudes de renovacin de recetas, pida a su farmacia que se ponga en contacto con nuestra oficina y deje que transcurran 72 horas para que se complete el proceso de las renovaciones.    Hoy usted recibi los siguientes agentes de quimioterapia e/o inmunoterapia: atezolizumab       Para ayudar a prevenir las nuseas y los vmitos despus de su tratamiento, le recomendamos que tome su medicamento para las nuseas segn las indicaciones.  LOS SNTOMAS QUE DEBEN COMUNICARSE INMEDIATAMENTE SE INDICAN A CONTINUACIN: *FIEBRE SUPERIOR A 100.4 F (38 C) O MS *ESCALOFROS O SUDORACIN *NUSEAS Y VMITOS QUE NO SE CONTROLAN CON EL MEDICAMENTO PARA LAS NUSEAS *DIFICULTAD INUSUAL PARA RESPIRAR  *MORETONES O HEMORRAGIAS NO HABITUALES *PROBLEMAS URINARIOS (dolor o ardor al Geographical information systems officer o frecuencia para Geographical information systems officer) *PROBLEMAS INTESTINALES (diarrea inusual, estreimiento, dolor cerca del ano) SENSIBILIDAD EN LA BOCA Y EN LA GARGANTA CON O SIN LA PRESENCIA DE LCERAS (dolor de garganta, llagas en la boca o dolor de muelas/dientes) ERUPCIN,  HINCHAZN O DOLORES INUSUALES FLUJO VAGINAL INUSUAL O PICAZN/RASQUIA    Los puntos marcados con un asterisco ( *) indican una posible emergencia y debe hacer un seguimiento tan pronto como le sea posible o vaya al Departamento de Emergencias si se le presenta algn problema.  Por favor, muestre la [place] DE ADVERTENCIA DE BABS [place] DE ADVERTENCIA DE LTANYA al registrarse en [address] de Emergencias y a la enfermera de triaje.  Si tiene preguntas despus de su visita o necesita cancelar o volver a programar su cita, por favor pngase en contacto con [facility]  Dept: [phone] y siga las instrucciones. Las horas de oficina son de 8:00 a.m. a 4:30 p.m. de lunes a viernes. Por favor, tenga en cuenta que los mensajes de voz que se dejan despus de las 4:00 p.m. posiblemente no se devolvern hasta el siguiente da de trabajo.  Cerramos los fines de semana y [employer]. En todo momento tiene acceso a una enfermera para preguntas urgentes. Por favor, llame al nmero principal de la clnica Dept: [phone] y siga las instrucciones.   Para cualquier pregunta que no sea de carcter urgente, tambin puede ponerse en contacto con su proveedor utilizando MyChart. Ahora ofrecemos visitas electrnicas para cualquier persona mayor de 18 aos que solicite atencin mdica en lnea para los sntomas que no sean urgentes. Para ms detalles vaya a [email or web address].   Tambin puede bajar la aplicacin de MyChart! Vaya a la  tienda de aplicaciones, busque MyChart, abra la aplicacin, seleccione [place], e ingrese con su nombre de usuario y la contrasea de [name].

## 2024-02-06 LAB — T4: T4, Total: 12.5 ug/dL — ABNORMAL HIGH (ref 4.5–12.0)

## 2024-02-07 ENCOUNTER — Encounter: Payer: Self-pay | Admitting: Nurse Practitioner

## 2024-02-07 ENCOUNTER — Encounter: Payer: Self-pay | Admitting: Oncology

## 2024-02-08 ENCOUNTER — Other Ambulatory Visit (HOSPITAL_COMMUNITY): Payer: Self-pay

## 2024-02-08 ENCOUNTER — Other Ambulatory Visit (HOSPITAL_BASED_OUTPATIENT_CLINIC_OR_DEPARTMENT_OTHER): Payer: Self-pay

## 2024-02-11 ENCOUNTER — Other Ambulatory Visit (HOSPITAL_COMMUNITY): Payer: Self-pay

## 2024-02-11 ENCOUNTER — Other Ambulatory Visit: Payer: Self-pay

## 2024-02-11 ENCOUNTER — Inpatient Hospital Stay: Payer: Self-pay

## 2024-02-11 ENCOUNTER — Inpatient Hospital Stay (HOSPITAL_BASED_OUTPATIENT_CLINIC_OR_DEPARTMENT_OTHER): Payer: Self-pay | Admitting: Physician Assistant

## 2024-02-11 ENCOUNTER — Encounter: Payer: Self-pay | Admitting: Oncology

## 2024-02-11 ENCOUNTER — Other Ambulatory Visit: Payer: Self-pay | Admitting: Nurse Practitioner

## 2024-02-11 ENCOUNTER — Other Ambulatory Visit (HOSPITAL_BASED_OUTPATIENT_CLINIC_OR_DEPARTMENT_OTHER): Payer: Self-pay

## 2024-02-11 VITALS — BP 101/64 | HR 74 | Temp 99.2°F | Resp 18 | Wt 111.8 lb

## 2024-02-11 DIAGNOSIS — R112 Nausea with vomiting, unspecified: Secondary | ICD-10-CM

## 2024-02-11 DIAGNOSIS — C220 Liver cell carcinoma: Secondary | ICD-10-CM

## 2024-02-11 DIAGNOSIS — K068 Other specified disorders of gingiva and edentulous alveolar ridge: Secondary | ICD-10-CM

## 2024-02-11 LAB — CMP (CANCER CENTER ONLY)
ALT: 95 U/L — ABNORMAL HIGH (ref 0–44)
AST: 204 U/L (ref 15–41)
Albumin: 4.1 g/dL (ref 3.5–5.0)
Alkaline Phosphatase: 79 U/L (ref 38–126)
Anion gap: 6 (ref 5–15)
BUN: 7 mg/dL (ref 6–20)
CO2: 28 mmol/L (ref 22–32)
Calcium: 9.6 mg/dL (ref 8.9–10.3)
Chloride: 97 mmol/L — ABNORMAL LOW (ref 98–111)
Creatinine: 0.57 mg/dL (ref 0.44–1.00)
GFR, Estimated: >60 mL/min (ref >60)
Glucose, Bld: 94 mg/dL (ref 70–99)
Potassium: 4.1 mmol/L (ref 3.5–5.1)
Sodium: 131 mmol/L — ABNORMAL LOW (ref 135–145)
Total Bilirubin: 1 mg/dL (ref 0.0–1.2)
Total Protein: 9 g/dL — ABNORMAL HIGH (ref 6.5–8.1)

## 2024-02-11 LAB — MAGNESIUM: Magnesium: 2 mg/dL (ref 1.7–2.4)

## 2024-02-11 LAB — CBC WITH DIFFERENTIAL (CANCER CENTER ONLY)
Abs Immature Granulocytes: 0.01 K/uL (ref 0.00–0.07)
Basophils Absolute: 0.1 K/uL (ref 0.0–0.1)
Basophils Relative: 2 %
Eosinophils Absolute: 0.6 K/uL — ABNORMAL HIGH (ref 0.0–0.5)
Eosinophils Relative: 11 %
HCT: 24.8 % — ABNORMAL LOW (ref 36.0–46.0)
Hemoglobin: 8.4 g/dL — ABNORMAL LOW (ref 12.0–15.0)
Immature Granulocytes: 0 %
Lymphocytes Relative: 31 %
Lymphs Abs: 1.7 K/uL (ref 0.7–4.0)
MCH: 26.8 pg (ref 26.0–34.0)
MCHC: 33.9 g/dL (ref 30.0–36.0)
MCV: 79.2 fL — ABNORMAL LOW (ref 80.0–100.0)
Monocytes Absolute: 0.5 K/uL (ref 0.1–1.0)
Monocytes Relative: 9 %
Neutro Abs: 2.7 K/uL (ref 1.7–7.7)
Neutrophils Relative %: 47 %
Platelet Count: 327 K/uL (ref 150–400)
RBC: 3.13 MIL/uL — ABNORMAL LOW (ref 3.87–5.11)
RDW: 20.9 % — ABNORMAL HIGH (ref 11.5–15.5)
WBC Count: 5.7 K/uL (ref 4.0–10.5)
nRBC: 0 % (ref 0.0–0.2)

## 2024-02-11 MED ORDER — PROCHLORPERAZINE MALEATE 10 MG PO TABS
10.0000 mg | ORAL_TABLET | Freq: Four times a day (QID) | ORAL | 1 refills | Status: DC | PRN
Start: 2024-02-11 — End: 2024-04-27
  Filled 2024-02-11: qty 30, 8d supply, fill #0

## 2024-02-11 MED ORDER — SODIUM CHLORIDE 0.9 % IV SOLN: Freq: Once | INTRAVENOUS | Status: AC

## 2024-02-11 NOTE — Progress Notes (Signed)
 Symptom Management Consult Note [facility]    Patient Care Team: [name], MD as PCP - General (Oncology)    Name / MRN / DOB: Christina Gutierrez  [id number]  1976/05/08   Date of visit: 02/11/2024   Chief Complaint/Reason for visit: nause and vomiting   Current Therapy: Tecentriq  and bevacizumab   Last treatment:  Day 1   Cycle 8 on 02/05/24    ASSESSMENT AND PLAN Patient is a 48 y.o. female with oncologic history of hepatocellular carcinoma followed by Dr. Autumn.  I have viewed most recent oncology note and lab work.  #Hepatocellular carcinoma - Bevacizumab  currently being held 2/2 bleeding gums. - Next appointment with oncologist is 02/26/24.   #Gingival bleeding  - Happens mostly when brushing teeth now. Last dose of bevacizumab  was 12/25/23. - Patient was able to reschedule dental appointment to tomorrow. Per discussion with pharmacy staff patient is cleared to have a dental procedure if needed. Patient will have dentist office fax form to [facility] NP tomorrow if needed. - No active bleeding. CBC shows stable anemia, no thrombocytopenia.  #Nausea and vomiting - Persistent nausea and vomiting likely treatment related. Grade 2. - Discussed how to take antiemetics at home to maximize symptom management.  - Patient received 1L NS for hydration support. She denies need for antiemetic as nausea currently controlled. Dizziness resolved after IVF and patient feeling improved. - CMP significant for persistent elevated liver enzymes. Both are comparable to recent labs. T. bili is normal. No abdominal pain and benign exam.   Strict ED precautions discussed should symptoms worsen.   HEME/ONC HISTORY Oncology History  Hepatocellular carcinoma (HCC)  08/22/2023 Initial Diagnosis   Hepatocellular carcinoma (HCC)   08/27/2023 Cancer Staging   Staging form: Liver, AJCC 8th Edition - Clinical: Stage IIIA (cT3, cN0, cM0) - Signed by [name], MD  on 08/27/2023   09/11/2023 -  Chemotherapy   Patient is on Treatment Plan : HEPATOCELLULAR Atezolizumab  + Bevacizumab  q21d         INTERVAL HISTORY  Discussed the use of AI scribe software for clinical note transcription with the patient, who gave verbal consent to proceed.    Christina Gutierrez is a 49 y.o. female with oncologic history as above presenting to [facility] today with chief complaint of nausea and vomiting. Accompanied to clinic today by spouse who provides additional history. Video interpretor used during entirety of visit.  She has been experiencing nausea and vomiting x 2 days, with episodes occurring throughout the night and into the morning, leading to a loss of appetite, weakness, and dizziness with position changes. She consumed only a small amount of fluids and ate just one potato yesterday.  No fever chills,  or abdominal pain. She has been using Zofran  and Compazine  for nausea, taking Zofran  first and Compazine  if nausea persists or vomiting is severe. She has a refill of compazine  at the pharmacy she plans to pick up today. Had a bowel movement earlier today.  She reports continued gingival bleeding, especially when brushing her teeth at night. She had an appointment scheduled with her dentist for August or September but has secured an earlier appointment for tomorrow.    ROS  All other systems are reviewed and are negative for acute change except as noted in the HPI.    No Known Allergies   Past Medical History:  Diagnosis Date   Metabolic acidosis 08/21/2023     Past Surgical History:  Procedure Laterality Date  CESAREAN SECTION     IR IMAGING GUIDED PORT INSERTION  09/22/2023    Social History   Socioeconomic History   Marital status: Married    Spouse name: Not on file   Number of children: Not on file   Years of education: Not on file   Highest education level: Not on file  Occupational History   Not on file  Tobacco Use   Smoking  status: Never   Smokeless tobacco: Never  Vaping Use   Vaping status: Never Used  Substance and Sexual Activity   Alcohol use: No   Drug use: No   Sexual activity: Not on file  Other Topics Concern   Not on file  Social History Narrative   Not on file   Social Drivers of Health   Financial Resource Strain: Not on file  Food Insecurity: No Food Insecurity (02/05/2024)   Hunger Vital Sign    Worried About Running Out of Food in the Last Year: Never true    Ran Out of Food in the Last Year: Never true  Transportation Needs: No Transportation Needs (02/05/2024)   PRAPARE - Administrator, Civil Service (Medical): No    Lack of Transportation (Non-Medical): No  Physical Activity: Not on file  Stress: Not on file  Social Connections: Patient Declined (08/29/2023)   Social Connection and Isolation Panel    Frequency of Communication with Friends and Family: Patient declined    Frequency of Social Gatherings with Friends and Family: Patient declined    Attends Religious Services: Patient declined    Database administrator or Organizations: Patient declined    Attends Banker Meetings: Patient declined    Marital Status: Patient declined  Intimate Partner Violence: Not At Risk (02/05/2024)   Humiliation, Afraid, Rape, and Kick questionnaire    Fear of Current or Ex-Partner: No    Emotionally Abused: No    Physically Abused: No    Sexually Abused: No    No family history on file.   Current Outpatient Medications:    ALPRAZolam  (XANAX ) 0.5 MG tablet, Take 1 tablet (0.5 mg total) by mouth at bedtime as needed for anxiety., Disp: 30 tablet, Rfl: 0   lidocaine -prilocaine  (EMLA ) cream, Apply 1 Application topically as needed., Disp: 30 g, Rfl: 3   ondansetron  (ZOFRAN ) 8 MG tablet, Take 1 tablet (8 mg total) by mouth every 8 (eight) hours as needed for nausea or vomiting., Disp: 60 tablet, Rfl: 2   pantoprazole  (PROTONIX ) 40 MG tablet, Take 1 tablet (40 mg total) by  mouth daily at 6 (six) AM., Disp: 30 tablet, Rfl: 1   phytonadione  (VITAMIN K) 5 MG tablet, Take 2 tablets (10 mg total) by mouth daily., Disp: 6 tablet, Rfl: 0   prochlorperazine  (COMPAZINE ) 10 MG tablet, Take 1 tablet (10 mg total) by mouth every 6 (six) hours as needed for nausea or vomiting., Disp: 30 tablet, Rfl: 1   tenofovir  alafenamide (VEMLIDY ) 25 MG tablet, Take 1 tablet (25 mg total) by mouth daily., Disp: 30 tablet, Rfl: 11   traMADol  (ULTRAM ) 50 MG tablet, Take 1 tablet (50 mg total) by mouth every 6 (six) hours as needed for severe pain (pain score 7-10) or moderate pain (pain score 4-6)., Disp: 50 tablet, Rfl: 0  PHYSICAL EXAM ECOG FS:1 - Symptomatic but completely ambulatory    Vitals:   02/11/24 1228 02/11/24 1457  BP: 110/64 101/64  Pulse: 64 74  Resp: 20 18  Temp: 99.2 F (  37.3 C)   TempSrc: Temporal   SpO2: 100% 100%  Weight: 111 lb 12.8 oz (50.7 kg)    Physical Exam Vitals and nursing note reviewed.  Constitutional:      Appearance: She is not ill-appearing or toxic-appearing.  HENT:     Head: Normocephalic.     Comments: No gingival bleeding    Mouth/Throat:     Mouth: Mucous membranes are dry.  Eyes:     Conjunctiva/sclera: Conjunctivae normal.  Cardiovascular:     Rate and Rhythm: Normal rate and regular rhythm.     Pulses: Normal pulses.     Heart sounds: Normal heart sounds.  Pulmonary:     Effort: Pulmonary effort is normal.     Breath sounds: Normal breath sounds.  Abdominal:     General: Bowel sounds are normal. There is no distension.     Palpations: Abdomen is soft.     Tenderness: There is no abdominal tenderness. There is no guarding or rebound.  Musculoskeletal:     Cervical back: Normal range of motion.  Skin:    General: Skin is warm and dry.  Neurological:     Mental Status: She is alert.        LABORATORY DATA I have reviewed the data as listed    Latest Ref Rng & Units 02/11/2024   12:27 PM 02/05/2024    9:49 AM 01/18/2024     2:31 AM  CBC  WBC 4.0 - 10.5 K/uL 5.7  5.7  6.2   Hemoglobin 12.0 - 15.0 g/dL 8.4  8.3  8.2   Hematocrit 36.0 - 46.0 % 24.8  25.1  25.1   Platelets 150 - 400 K/uL 327  330  300         Latest Ref Rng & Units 02/11/2024   12:27 PM 02/05/2024    9:49 AM 01/18/2024    2:31 AM  CMP  Glucose 70 - 99 mg/dL 94  79  890   BUN 6 - 20 mg/dL 7  11  8    Creatinine 0.44 - 1.00 mg/dL 9.42  9.39  9.39   Sodium 135 - 145 mmol/L 131  134  135   Potassium 3.5 - 5.1 mmol/L 4.1  3.8  4.5   Chloride 98 - 111 mmol/L 97  103  102   CO2 22 - 32 mmol/L 28  26  22    Calcium 8.9 - 10.3 mg/dL 9.6  9.5  9.2   Total Protein 6.5 - 8.1 g/dL 9.0  8.8  8.6   Total Bilirubin 0.0 - 1.2 mg/dL 1.0  0.9  0.7   Alkaline Phos 38 - 126 U/L 79  98  101   AST 15 - 41 U/L 204  186  216   ALT 0 - 44 U/L 95  95  110        RADIOGRAPHIC STUDIES (from last 24 hours if applicable) I have personally reviewed the radiological images as listed and agreed with the findings in the report. No results found.      Visit Diagnosis: 1. Hepatocellular carcinoma (HCC)   2. Gingival bleeding   3. Nausea and vomiting, unspecified vomiting type      No orders of the defined types were placed in this encounter.   All questions were answered. The patient knows to call the clinic with any problems, questions or concerns. No barriers to learning was detected.  A total of more than 30 minutes were spent on this encounter with  face-to-face time and non-face-to-face time, including preparing to see the patient, ordering tests and/or medications, counseling the patient and coordination of care as outlined above.    Thank you for allowing me to participate in the care of this patient.    Ricca Melgarejo E  Walisiewicz, PA-C Department of Hematology/Oncology [facility] at [facility] Phone: [phone]  [phone]    02/11/2024 3:48 PM

## 2024-02-11 NOTE — Patient Instructions (Addendum)
 Nuseas y vmitos en los adultos Nausea and Vomiting, Adult Las nuseas son Neomia Dear sensacin de Dentist en el estmago y de que se est por vomitar. Los vmitos se producen cuando los alimentos que estn en el estmago salen con fuerza de la boca. Los vmitos pueden hacerlo sentir dbil. Si vomita, o si no puede beber una cantidad suficiente de lquido, es posible que no tenga suficiente agua en el cuerpo (se deshidrate). Si no tiene suficiente agua en el cuerpo, es posible que usted: Se sienta cansado. Sienta sed. Tenga la boca seca. Tenga los labios agrietados. Haga pis (orine) con menos frecuencia. Los [employer] y las personas que tienen otras enfermedades o que tienen debilitado el sistema de defensa del cuerpo (sistema inmunitario) corren un mayor riesgo de no tener una cantidad suficiente de agua del cuerpo. Si siente ganas de vomitar o vomita, es importante que [address] las indicaciones del mdico acerca de cmo cuidarse. Siga estas indicaciones en su casa: Controle sus sntomas para detectar cualquier cambio. Dgale a su mdico sobre ellos. Qu debe comer y beber     Tome una SRO (solucin de rehidratacin oral). Es Neomia Dear bebida que se vende en farmacias y tiendas. Beba lquidos transparentes en pequeas cantidades segn le sea posible, por ejemplo: Agua. Trocitos de hielo. Jugo de frutas con agua agregada (jugo de frutas diluido). Bebidas deportivas de bajas caloras. En la medida en que pueda, consuma alimentos blandos y fciles de digerir en pequeas cantidades, por ejemplo, los siguientes: Bananas. Pur de [employer]. Arroz. Carnes bajas en grasa Nonah Mattes). Pan tostado. Galletas. Evite beber lquidos con alto contenido de azcar o cafena. Esto incluye bebidas energticas, bebidas deportivas y gaseosas. Evite tomar alcohol. Evite los alimentos condimentados o con alto contenido de [place]. Indicaciones generales Use los medicamentos de venta libre y los recetados solamente como se  lo haya indicado el mdico. Beba suficiente lquido para Pharmacologist el pis (la orina) de color amarillo plido. Lvese las manos frecuentemente con agua y jabn durante al menos 20 segundos. Use un desinfectante para manos si no dispone de [place] y [place]. Asegrese de que todas las personas que viven en su casa se laven bien las manos y con frecuencia. Haga reposo en su casa hasta que se sienta mejor. Controle su afeccin para Insurance risk surveyor cambio. Respire de forma lenta y profunda cuando sienta ganas de vomitar. Concurra a todas las visitas de seguimiento. Comunquese con un mdico si: Sus sntomas empeoran. Aparecen nuevos sntomas. Tiene fiebre. No puede beber lquidos sin vomitar. La sensacin de que va a vomitar dura ms de 2 das. Se siente aturdido o mareado. Tiene dolor de [place]. Tiene calambres musculares. Tiene una erupcin cutnea. Siente dolor al [employer]. Solicite ayuda de inmediato si: [employer], el cuello, los brazos o la [place]. Se siente muy dbil o se desmaya. Vomita una y [place]. Vomita y el vmito es de color rojo intenso o se asemeja al poso del caf. Tiene deposiciones (heces) con sangre o de color negro, o con aspecto alquitranado. Siente un dolor de cabeza intenso, rigidez en el cuello, o ambas cosas. Tiene dolor muy intenso en el vientre (abdomen), clicos o meteorismo. Tiene dificultad para respirar. Respira muy rpido. Su corazn late muy rpidamente. Siente la piel fra y hmeda. Se siente confundido. Tiene signos de haber perdido [place] cantidad excesiva de agua del cuerpo, como: [place], muy escasa o falta de [place]. Labios agrietados. Sequedad de boca. Ojos hundidos. Somnolencia. Debilidad. Estos sntomas pueden  indicar Radio broadcast assistant. Solicite ayuda de inmediato. Llame al 911. No espere a ver si los sntomas desaparecen. No conduzca por sus propios medios [employer] [facility]. Resumen Las nuseas son Neomia Dear sensacin de  Dentist en el estmago y de que se est por vomitar. Los vmitos se producen cuando los alimentos que se encuentran en el estmago salen por la boca. Siga las instrucciones del mdico respecto de las comidas y las bebidas. Use los medicamentos de venta libre y los recetados solamente como se lo haya indicado el mdico. Comunquese con su mdico si sus sntomas empeoran o si tiene nuevos sntomas. Concurra a todas las visitas de seguimiento. Esta informacin no tiene Theme park manager el consejo del mdico. Asegrese de hacerle al mdico cualquier pregunta que tenga. Document Revised: 01/23/2021 Document Reviewed: 01/23/2021 Elsevier Patient Education  2024 [employer].

## 2024-02-11 NOTE — Progress Notes (Signed)
 Orders placed for [facility] visit today per Mallie ORN PA-C.

## 2024-02-11 NOTE — Progress Notes (Signed)
 CRITICAL VALUE STICKER  CRITICAL VALUE: AST [id number]  RECEIVER (on-site recipient of call): Elenor Blush RN  DATE & TIME NOTIFIED: 02/11/2024 & 13:36  MESSENGER (representative from lab): Holley Olive MT  MD NOTIFIED: Mallie Combes PA-C  TIME OF NOTIFICATION:13:37

## 2024-02-12 ENCOUNTER — Emergency Department (HOSPITAL_COMMUNITY): Payer: Self-pay

## 2024-02-12 ENCOUNTER — Emergency Department (HOSPITAL_COMMUNITY)
Admission: EM | Admit: 2024-02-12 | Discharge: 2024-02-12 | Disposition: A | Discharge status: Home / Self Care | Payer: Self-pay | Attending: Emergency Medicine | Admitting: Emergency Medicine

## 2024-02-12 ENCOUNTER — Encounter (HOSPITAL_COMMUNITY): Payer: Self-pay | Admitting: Radiology

## 2024-02-12 ENCOUNTER — Other Ambulatory Visit: Payer: Self-pay

## 2024-02-12 DIAGNOSIS — C220 Liver cell carcinoma: Secondary | ICD-10-CM | POA: Insufficient documentation

## 2024-02-12 DIAGNOSIS — R1084 Generalized abdominal pain: Secondary | ICD-10-CM | POA: Insufficient documentation

## 2024-02-12 DIAGNOSIS — R519 Headache, unspecified: Secondary | ICD-10-CM | POA: Insufficient documentation

## 2024-02-12 LAB — CBC WITH DIFFERENTIAL/PLATELET
Abs Immature Granulocytes: 0.02 K/uL (ref 0.00–0.07)
Basophils Absolute: 0.1 K/uL (ref 0.0–0.1)
Basophils Relative: 2 %
Eosinophils Absolute: 0.5 K/uL (ref 0.0–0.5)
Eosinophils Relative: 7 %
HCT: 27 % — ABNORMAL LOW (ref 36.0–46.0)
Hemoglobin: 8.3 g/dL — ABNORMAL LOW (ref 12.0–15.0)
Immature Granulocytes: 0 %
Lymphocytes Relative: 22 %
Lymphs Abs: 1.6 K/uL (ref 0.7–4.0)
MCH: 25.8 pg — ABNORMAL LOW (ref 26.0–34.0)
MCHC: 30.7 g/dL (ref 30.0–36.0)
MCV: 83.9 fL (ref 80.0–100.0)
Monocytes Absolute: 0.6 K/uL (ref 0.1–1.0)
Monocytes Relative: 9 %
Neutro Abs: 4.3 K/uL (ref 1.7–7.7)
Neutrophils Relative %: 60 %
Platelets: 347 K/uL (ref 150–400)
RBC: 3.22 MIL/uL — ABNORMAL LOW (ref 3.87–5.11)
RDW: 21.7 % — ABNORMAL HIGH (ref 11.5–15.5)
Smear Review: NORMAL
WBC: 7.1 K/uL (ref 4.0–10.5)
nRBC: 0 % (ref 0.0–0.2)

## 2024-02-12 LAB — APTT: aPTT: 38 s — ABNORMAL HIGH (ref 24–36)

## 2024-02-12 LAB — C-REACTIVE PROTEIN: CRP: 1.1 mg/dL — ABNORMAL HIGH (ref <1.0)

## 2024-02-12 LAB — PROTIME-INR
INR: 1.5 — ABNORMAL HIGH (ref 0.8–1.2)
Prothrombin Time: 19 s — ABNORMAL HIGH (ref 11.4–15.2)

## 2024-02-12 LAB — SEDIMENTATION RATE: Sed Rate: 115 mm/h — ABNORMAL HIGH (ref 0–22)

## 2024-02-12 MED ORDER — HEPARIN SOD (PORK) LOCK FLUSH 100 UNIT/ML IV SOLN
500.0000 [IU] | Freq: Once | INTRAVENOUS | Status: AC
  Administered 2024-02-12: 500 [IU]
  Filled 2024-02-12: qty 5

## 2024-02-12 MED ORDER — METOCLOPRAMIDE HCL 5 MG/ML IJ SOLN
10.0000 mg | Freq: Once | INTRAMUSCULAR | Status: AC
  Administered 2024-02-12: 10 mg via INTRAVENOUS
  Filled 2024-02-12: qty 2

## 2024-02-12 MED ORDER — PROCHLORPERAZINE EDISYLATE 10 MG/2ML IJ SOLN
10.0000 mg | Freq: Once | INTRAMUSCULAR | Status: AC
  Administered 2024-02-12: 10 mg via INTRAVENOUS
  Filled 2024-02-12: qty 2

## 2024-02-12 MED ORDER — FENTANYL CITRATE PF 50 MCG/ML IJ SOSY
50.0000 ug | PREFILLED_SYRINGE | Freq: Once | INTRAMUSCULAR | Status: AC
  Administered 2024-02-12: 50 ug via INTRAVENOUS
  Filled 2024-02-12: qty 1

## 2024-02-12 MED ORDER — IOHEXOL 300 MG/ML  SOLN
100.0000 mL | Freq: Once | INTRAMUSCULAR | Status: AC | PRN
  Administered 2024-02-12: 100 mL via INTRAVENOUS

## 2024-02-12 MED ORDER — SIMETHICONE 40 MG/0.6ML PO SUSP
40.0000 mg | Freq: Once | ORAL | Status: AC
  Administered 2024-02-12: 40 mg via ORAL
  Filled 2024-02-12: qty 0.6

## 2024-02-12 MED ORDER — DIPHENHYDRAMINE HCL 50 MG/ML IJ SOLN
25.0000 mg | Freq: Once | INTRAMUSCULAR | Status: AC
  Administered 2024-02-12: 25 mg via INTRAVENOUS
  Filled 2024-02-12: qty 1

## 2024-02-12 MED ORDER — GADOBUTROL 1 MMOL/ML IV SOLN
5.0000 mL | Freq: Once | INTRAVENOUS | Status: AC | PRN
  Administered 2024-02-12: 5 mL via INTRAVENOUS

## 2024-02-12 MED ORDER — KETOROLAC TROMETHAMINE 15 MG/ML IJ SOLN
15.0000 mg | Freq: Once | INTRAMUSCULAR | Status: AC
  Administered 2024-02-12: 15 mg via INTRAVENOUS
  Filled 2024-02-12: qty 1

## 2024-02-12 NOTE — ED Notes (Signed)
 EDP back at [facility], using AMN video interpreter.

## 2024-02-12 NOTE — ED Notes (Signed)
 Pt ambulated to restroom accompanied by family

## 2024-02-12 NOTE — ED Notes (Addendum)
 EDP into room, at [facility]. Family at [facility]. Pt in b/r.

## 2024-02-12 NOTE — ED Notes (Signed)
 Alert, NAD, calm, interactive. AMN video interpreter used for triage and initial information. EMLA  cream applied to port PTA.

## 2024-02-12 NOTE — ED Notes (Signed)
Pt ambulated to restroom with assistance from family member

## 2024-02-12 NOTE — ED Provider Notes (Signed)
 [place] EMERGENCY DEPARTMENT AT [facility] Provider Note   CSN: [id number] Arrival date & time: 02/12/24  9078     Patient presents with: body pain   Christina Gutierrez is a 48 y.o. female.   HPI     Pt has hx of hepatocellular cancer with portal htn, Pt comes in with cc of headache that is radiating to the feet. The pain is sharp, affecting the entire body, constant and new starting last night. Pain is worse with movement, located primarily over the front, crown. Pt has pain in her eyes.  She denies any photophobia.  Headache is not positional and not worse with laying flat.  Review of system is negative for any new one-sided weakness, numbness, slurred speech, vision loss.  Patient denies any fevers, chills.  She has chronic nausea.  Cancer status: last chemo - last week.   Prior to Admission medications   Medication Sig Start Date End Date Taking? Authorizing Provider  ALPRAZolam  (XANAX ) 0.5 MG tablet Take 1 tablet (0.5 mg total) by mouth at bedtime as needed for anxiety. 02/05/24   Boscia, Heather E, NP  lidocaine -prilocaine  (EMLA ) cream Apply 1 Application topically as needed. 01/15/24   Pasam, Chinita, MD  ondansetron  (ZOFRAN ) 8 MG tablet Take 1 tablet (8 mg total) by mouth every 8 (eight) hours as needed for nausea or vomiting. 01/15/24   Pasam, Chinita, MD  pantoprazole  (PROTONIX ) 40 MG tablet Take 1 tablet (40 mg total) by mouth daily at 6 (six) AM. 12/25/23   Pasam, Avinash, MD  phytonadione  (VITAMIN K) 5 MG tablet Take 2 tablets (10 mg total) by mouth daily. 12/25/23   Pasam, Chinita, MD  prochlorperazine  (COMPAZINE ) 10 MG tablet Take 1 tablet (10 mg total) by mouth every 6 (six) hours as needed for nausea or vomiting. 02/11/24   Hanford Powell BRAVO, NP  tenofovir  alafenamide (VEMLIDY ) 25 MG tablet Take 1 tablet (25 mg total) by mouth daily. 09/22/23   Fleeta Kathie Jomarie LOISE, MD  traMADol  (ULTRAM ) 50 MG tablet Take 1 tablet (50 mg total) by mouth every 6 (six) hours  as needed for severe pain (pain score 7-10) or moderate pain (pain score 4-6). 02/05/24   Boscia, Heather E, NP    Allergies: Patient has no known allergies.    Review of Systems  All other systems reviewed and are negative.   Updated Vital Signs BP 106/64 (BP Location: Left Arm)   Pulse 84   Temp 97.9 F (36.6 C) (Oral)   Resp 16   SpO2 99%   Physical Exam Vitals and nursing note reviewed.  Constitutional:      Appearance: She is well-developed.  HENT:     Head: Atraumatic.  Eyes:     Extraocular Movements: Extraocular movements intact.     Pupils: Pupils are equal, round, and reactive to light.  Cardiovascular:     Rate and Rhythm: Normal rate.  Pulmonary:     Effort: Pulmonary effort is normal.  Musculoskeletal:     Cervical back: Normal range of motion and neck supple.  Skin:    General: Skin is warm and dry.  Neurological:     Mental Status: She is alert and oriented to person, place, and time.     Cranial Nerves: No cranial nerve deficit.     Sensory: No sensory deficit.     Motor: No weakness.     Coordination: Coordination normal.     (all labs ordered are listed, but only abnormal results  are displayed) Labs Reviewed  PROTIME-INR - Abnormal; Notable for the following components:      Result Value   Prothrombin Time 19.0 (*)    INR 1.5 (*)    All other components within normal limits  APTT - Abnormal; Notable for the following components:   aPTT 38 (*)    All other components within normal limits  CBC WITH DIFFERENTIAL/PLATELET - Abnormal; Notable for the following components:   RBC 3.22 (*)    Hemoglobin 8.3 (*)    HCT 27.0 (*)    MCH 25.8 (*)    RDW 21.7 (*)    All other components within normal limits  SEDIMENTATION RATE - Abnormal; Notable for the following components:   Sed Rate 115 (*)    All other components within normal limits  C-REACTIVE PROTEIN    EKG: None  Radiology: MR Venogram Head Result Date: 02/12/2024 EXAM: MRV BRAIN  02/12/2024 01:43:24 PM TECHNIQUE: Multiplanar multisequence MRV of the head was performed without the administration of intravenous contrast. Multiplanar 2D and 3D reformatted images are provided for review. COMPARISON: CT head dated 09/27/2023. CLINICAL HISTORY: Headache, new onset (Age >= 51y); headache - liver cancer hx. Headache- cancer hx. Pt reports pain all over body starting last night. Had rehydration at the cancer center yesterday d/t some vomiting a few days ago. Took home tramadol  around 8a with no relief. Denies fevers. Endorses some feeling like she can't catch her breath. FINDINGS: No focal stenosis or thrombus is seen of the major dural venous sinuses. IMPRESSION: 1. Unremarkable MRV of the head. Electronically signed by: evalene coho 02/12/2024 01:55 PM EDT RP Workstation: [id number]   MR Brain W and Wo Contrast Result Date: 02/12/2024 EXAM: MRI BRAIN WITH AND WITHOUT CONTRAST 02/12/2024 01:42:39 PM TECHNIQUE: Multiplanar multisequence MRI of the head/brain was performed with and without the administration of intravenous contrast. COMPARISON: CT of the head dated 09/27/2023. CLINICAL HISTORY: Headache - cancer history. Patient reports pain all over body starting last night. Had rehydration at the cancer center yesterday due to some vomiting a few days ago. Took home tramadol  around 8 AM with no relief. Denies fevers. Endorses some feeling like she can't catch her breath. FINDINGS: BRAIN AND VENTRICLES: No acute infarct. No acute intracranial hemorrhage. No mass effect or midline shift. No hydrocephalus. The sella is unremarkable. Normal flow voids. No mass or abnormal enhancement. ORBITS: No acute abnormality. SINUSES: No acute abnormality. BONES AND SOFT TISSUES: Normal bone marrow signal and enhancement. No acute soft tissue abnormality. IMPRESSION: 1. No acute intracranial abnormality. 2. No mass or abnormal enhancement. Electronically signed by: evalene coho 02/12/2024 01:53 PM EDT RP  Workstation: [id number]     Procedures   Medications Ordered in the ED  ketorolac  (TORADOL ) 15 MG/ML injection 15 mg (has no administration in time range)  metoCLOPramide  (REGLAN ) injection 10 mg (has no administration in time range)  simethicone  (MYLICON) 40 MG/0.6ML suspension 40 mg (has no administration in time range)  prochlorperazine  (COMPAZINE ) injection 10 mg (10 mg Intravenous Given 02/12/24 1124)  diphenhydrAMINE  (BENADRYL ) injection 25 mg (25 mg Intravenous Given 02/12/24 1124)  gadobutrol  (GADAVIST ) 1 MMOL/ML injection 5 mL (5 mLs Intravenous Contrast Given 02/12/24 1336)  fentaNYL  (SUBLIMAZE ) injection 50 mcg (50 mcg Intravenous Given 02/12/24 1536)    Clinical Course as of 02/12/24 1611  Fri Feb 12, 2024  1514 Signout; H/o hepat carcinoma on chemo. HA/abd pain. MRI negative. CT is pending. Concern for obstruction.  [TY]    Clinical Course User Index [  TY] Neysa Caron PARAS, DO                                 Medical Decision Making Amount and/or Complexity of Data Reviewed Labs: ordered. Radiology: ordered.  Risk Prescription drug management.   48year old patient comes in with cc of headaches. Pertinent past medical includes hepatocellular carcinoma currently getting chemotherapy. I reviewed patient's records including oncology notes. Patient denies any previous history of headaches.  Red flag in this case includes hypercoagulable state because of cancer. Headache has been now present for 1 day, it is severe and she has no focal neurodeficits.  Differential diagnosis consideration for this patient includes: Primary headaches - including migrainous headaches, cluster headaches, tension headaches. ICH Carotid dissection Cavernous sinus thrombosis/venous dural thrombosis Meningitis Encephalitis Sinusitis Tumor Vascular headaches AV malformation Brain aneurysm Muscular headaches  Assessment and plan: 48 year old patient with cancer, currently on chemotherapy  comes in with new onset headache.  Headache not positional.  She has some eye pain, but no photophobia.  No vision change.  Not clearly demonstrating symptoms consistent with IIH.  Not complaining of anything infectious right now.  We will get MRI brain with and without contrast with MRV.  Additionally, patient is also complaining of abdominal discomfort.  She states that she has not had a bowel movement in several days.  She does not think she is passing gas.  She feels constipated and bloated.  Abdominal exam is overall reassuring.  Patient given some water, she feels gassy.  She denies any vomiting.  She was able to tolerate the p.o., but still feels uncomfortable.  She has been taking over-the-counter constipation medications.  We will get CT abdomen and pelvis with contrast if patient continues to have discomfort on repeat assessment.   Reassessment: Patient reassessed at 3:30 PM.  She states that her headache is significantly better.  It was 10 out of 10 when she was first seen, but now it is 4 out of 10.  Continues to have abdominal discomfort.  CT ordered.  Patient's care has been signed out to incoming team.  From a headache perspective, patient's MRI is negative.  This result has been discussed with the patient.  She has no neck pain, fevers, chills, new nausea or vomiting, sweats, vision change or loss.  Opportunistic infection and encephalitis thought to be less likely at this time.  Improvement in headache is also reassuring.  Patient is reliable.  She is here with family member.  Discussed with her how she is feeling in terms of the headache after the treatment.  Patient states that she feels a lot better and feels comfortable going home.  I discussed with her the possibility of infection and the possibility of her headache getting worse.  Patient and I discussed the return precautions.  She will return to the ER if she starts having any red flag suggesting elevated ICP or she starts  developing fevers, chills.  If the CT is negative, then patient is comfortable going home. I have discussed the case with oncology nurse practitioner Powell Malta, who will graciously have oncology nurse call the patient for symptom checkup next week.  Patient already has an appointment in 2 weeks.   Final diagnoses:  New onset headache  HCC (hepatocellular carcinoma) (HCC)  Generalized abdominal pain    ED Discharge Orders     None          Jonette Wassel,  Xavi Tomasik, MD 02/12/24 8388

## 2024-02-12 NOTE — ED Provider Notes (Signed)
 Clinical Course as of 02/12/24 1704  Fri Feb 12, 2024  1514 Signout; H/o hepat carcinoma on chemo. HA/abd pain. MRI negative. CT is pending. Concern for obstruction.  [TY]  1654 CT ABDOMEN PELVIS W CONTRAST IMPRESSION: 1. No acute findings in the abdomen/pelvis. 2. Evidence of patient's known extensive multifocal hepatocellular carcinoma without significant change. 3. Mild cholelithiasis. 4. 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 5. Prominent air and stool filled colon.   Electronically Signed   By: Toribio Agreste M.D.   On: 02/12/2024 16:50   [TY]  1703 Patient updated on CT scan results, reassessed.  Feeling much better after medications.  Reassuring labs and vital signs.  Discussed bowel regimen and close follow-up.  Patient is comfortable with plan and agreeable.  Will follow-up with her oncologist and primary doctors.  Stable for discharge at this time. [TY]    Clinical Course User Index [TY] Neysa Caron PARAS, DO      Neysa Caron PARAS, [place] 02/12/24 1704

## 2024-02-12 NOTE — ED Triage Notes (Signed)
 Pt reports pain all over body starting last night. Had rehydration at the cancer center yesterday d/t some vomiting a few days ago. Took home tramadol  around 8a with no relief. Denies fevers. Endorses some feeling like she can't catch her breath. Denies UTI sx. Last bm Saturday

## 2024-02-12 NOTE — Discharge Instructions (Addendum)
 You were seen in the emergency room for headache and abdominal pain.  Start taking MiraLAX ,  1 capful twice a day until soft bowel movements and then titrate to effect typical doses of half To 1 capful a day.  The workup is overall reassuring.  Specifically, the MRI of the brain did not show any clots, bleeding, abscess, tumor.  However, given that your immune system is weak, we recommend that you come to the ER if your symptoms gets worse.  Please return to the ER if the headache gets severe and in not improving, you have associated new one sided numbness, tingling, weakness or confusion, seizures, poor balance or poor vision.

## 2024-02-16 ENCOUNTER — Other Ambulatory Visit (HOSPITAL_COMMUNITY): Payer: Self-pay

## 2024-02-16 ENCOUNTER — Other Ambulatory Visit: Payer: Self-pay | Admitting: Pharmacy Technician

## 2024-02-16 ENCOUNTER — Other Ambulatory Visit: Payer: Self-pay

## 2024-02-16 NOTE — Progress Notes (Signed)
 Specialty Pharmacy Refill Coordination Note  Christina Gutierrez is a 48 y.o. female contacted today regarding refills of specialty medication(s) Tenofovir  Alafenamide Fumarate (VEMLIDY )   Patient requested Pickup at [facility] at [place] date: 02/16/24   Medication will be filled on 02/16/24.  Spoke to [place] interpreter Tower

## 2024-02-17 ENCOUNTER — Other Ambulatory Visit: Payer: Self-pay

## 2024-02-20 ENCOUNTER — Other Ambulatory Visit (HOSPITAL_COMMUNITY): Payer: Self-pay

## 2024-02-22 ENCOUNTER — Other Ambulatory Visit (HOSPITAL_COMMUNITY): Payer: Self-pay

## 2024-02-22 ENCOUNTER — Encounter: Payer: Self-pay | Admitting: Oncology

## 2024-02-26 ENCOUNTER — Encounter: Payer: Self-pay | Admitting: Oncology

## 2024-02-26 ENCOUNTER — Other Ambulatory Visit: Payer: Self-pay

## 2024-02-26 ENCOUNTER — Other Ambulatory Visit (HOSPITAL_COMMUNITY): Payer: Self-pay

## 2024-02-26 ENCOUNTER — Inpatient Hospital Stay: Payer: Self-pay

## 2024-02-26 ENCOUNTER — Inpatient Hospital Stay: Payer: Self-pay | Admitting: Oncology

## 2024-02-26 VITALS — BP 105/61 | HR 75 | Temp 97.9°F | Resp 17 | Ht 60.0 in | Wt 108.0 lb

## 2024-02-26 DIAGNOSIS — C220 Liver cell carcinoma: Secondary | ICD-10-CM

## 2024-02-26 DIAGNOSIS — K068 Other specified disorders of gingiva and edentulous alveolar ridge: Secondary | ICD-10-CM

## 2024-02-26 DIAGNOSIS — Z95828 Presence of other vascular implants and grafts: Secondary | ICD-10-CM

## 2024-02-26 DIAGNOSIS — B181 Chronic viral hepatitis B without delta-agent: Secondary | ICD-10-CM

## 2024-02-26 LAB — CBC WITH DIFFERENTIAL (CANCER CENTER ONLY)
Abs Immature Granulocytes: 0.01 K/uL (ref 0.00–0.07)
Basophils Absolute: 0.1 K/uL (ref 0.0–0.1)
Basophils Relative: 2 %
Eosinophils Absolute: 0.7 K/uL — ABNORMAL HIGH (ref 0.0–0.5)
Eosinophils Relative: 13 %
HCT: 24.2 % — ABNORMAL LOW (ref 36.0–46.0)
Hemoglobin: 8.2 g/dL — ABNORMAL LOW (ref 12.0–15.0)
Immature Granulocytes: 0 %
Lymphocytes Relative: 29 %
Lymphs Abs: 1.6 K/uL (ref 0.7–4.0)
MCH: 27.2 pg (ref 26.0–34.0)
MCHC: 33.9 g/dL (ref 30.0–36.0)
MCV: 80.4 fL (ref 80.0–100.0)
Monocytes Absolute: 0.5 K/uL (ref 0.1–1.0)
Monocytes Relative: 9 %
Neutro Abs: 2.6 K/uL (ref 1.7–7.7)
Neutrophils Relative %: 47 %
Platelet Count: 303 K/uL (ref 150–400)
RBC: 3.01 MIL/uL — ABNORMAL LOW (ref 3.87–5.11)
RDW: 21.6 % — ABNORMAL HIGH (ref 11.5–15.5)
WBC Count: 5.5 K/uL (ref 4.0–10.5)
nRBC: 0 % (ref 0.0–0.2)

## 2024-02-26 LAB — CMP (CANCER CENTER ONLY)
ALT: 148 U/L — ABNORMAL HIGH (ref 0–44)
AST: 257 U/L (ref 15–41)
Albumin: 3.9 g/dL (ref 3.5–5.0)
Alkaline Phosphatase: 93 U/L (ref 38–126)
Anion gap: 8 (ref 5–15)
BUN: 7 mg/dL (ref 6–20)
CO2: 26 mmol/L (ref 22–32)
Calcium: 9.2 mg/dL (ref 8.9–10.3)
Chloride: 103 mmol/L (ref 98–111)
Creatinine: 0.45 mg/dL (ref 0.44–1.00)
GFR, Estimated: >60 mL/min (ref >60)
Glucose, Bld: 103 mg/dL — ABNORMAL HIGH (ref 70–99)
Potassium: 3.6 mmol/L (ref 3.5–5.1)
Sodium: 137 mmol/L (ref 135–145)
Total Bilirubin: 1.1 mg/dL (ref 0.0–1.2)
Total Protein: 8.1 g/dL (ref 6.5–8.1)

## 2024-02-26 LAB — TOTAL PROTEIN, URINE DIPSTICK: Protein, ur: NEGATIVE mg/dL

## 2024-02-26 LAB — TSH: TSH: 2.78 u[IU]/mL (ref 0.350–4.500)

## 2024-02-26 MED ORDER — SODIUM CHLORIDE 0.9% FLUSH
10.0000 mL | Freq: Once | INTRAVENOUS | Status: AC
  Administered 2024-02-26: 10 mL

## 2024-02-26 MED ORDER — PANTOPRAZOLE SODIUM 40 MG PO TBEC
40.0000 mg | DELAYED_RELEASE_TABLET | Freq: Every day | ORAL | 1 refills | Status: DC
  Filled 2024-02-26: qty 30, 30d supply, fill #0

## 2024-02-26 MED ORDER — SODIUM CHLORIDE 0.9 % IV SOLN: INTRAVENOUS | Status: DC

## 2024-02-26 MED ORDER — SODIUM CHLORIDE 0.9% FLUSH: 10.0000 mL | INTRAVENOUS | Status: DC | PRN

## 2024-02-26 MED ORDER — SODIUM CHLORIDE 0.9 % IV SOLN
1200.0000 mg | Freq: Once | INTRAVENOUS | Status: AC
  Administered 2024-02-26: 1200 mg via INTRAVENOUS
  Filled 2024-02-26: qty 20

## 2024-02-26 MED ORDER — LIDOCAINE-PRILOCAINE 2.5-2.5 % EX CREA
1.0000 | TOPICAL_CREAM | CUTANEOUS | 3 refills | Status: DC | PRN
  Filled 2024-02-26: qty 30, 7d supply, fill #0

## 2024-02-26 NOTE — Progress Notes (Signed)
 [facility]  ONCOLOGY CLINIC PROGRESS NOTE   Patient Care Team: [name], MD as PCP - General (Oncology)  PATIENT NAME: Christina Gutierrez   MR#: [id number] DOB: Nov 30, 1975  Date of visit: 02/26/2024   ASSESSMENT & PLAN:   Christina Gutierrez is a 48 y.o. Hispanic lady with no significant past medical history was hospitalized on 08/21/2023 after she presented with worsening right-sided abdominal pain, nausea, vomiting, decreased oral intake.  Workup including MRI of the abdomen showed evidence of multifocal hepatocellular carcinoma.  She was diagnosed with hepatitis B infection during this hospital admission.   Hepatocellular carcinoma (HCC) Please review oncology history for additional details and timeline of events.  MRI of the abdomen with liver protocol on 08/21/2023 showed numerous hepatic lesions are noted, with a dominant lesion which replaces much of the right lobe of the liver estimated to measure approximately 16.5 x 12.7 x 17.7 cm. This lesion is heterogeneous in signal intensity on T1 and T2 weighted images, but clearly has internal areas of hypervascular enhancement on early phase post gadolinium imaging, with persistent low-level enhancement on more delayed imaging. The other dominant lesion is centered in the superior aspect of the liver, likely within the superior aspect of segment 4A estimated to measure approximately 8.1 x 6.8 x 7.8 cm, with similar imaging characteristics to the previously described lesion, although with greater degree of internal washout and probable pseudo capsule on delayed imaging. Multiple other smaller hypervascular lesions are apparent on arterial phase imaging throughout all aspects of the liver, some of which demonstrate delayed washout.   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  This was confirmed during GI tumor conference on 09/09/2023.  -Previously undiagnosed hepatitis B could have  been the contributing factor. -Not a candidate for liver transplant or surgical resection because of the extent of disease. -Discussed diagnosis, prognosis, plan of care, treatment options.  Reviewed NCCN guidelines.   On 08/21/2023, AFP was significantly elevated at 32,160 ng/mL.     CT chest on 08/23/2023 showed no evidence of intrathoracic metastatic disease.  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  Plan made for systemic treatments with atezolizumab  plus bevacizumab .  Started this from 09/11/2023.  She has been tolerating treatments well without any major side effects.  Restaging MRI of the liver on 12/14/2023 showed stable disease without evidence of disease progression or new disease.  She had CT abdomen and pelvis on 02/12/2024 when she was hospitalized and it also showed stable disease.  Plan to continue current management.  However given progressive gum bleeding, we will hold bevacizumab  to see if that would help improve her symptoms.  Last dose of Avastin  was on 12/25/2023.  Labs today reveal no dose-limiting toxicities.  Persistent elevated LFTs, likely from hepatitis B and from hepatocellular carcinoma.  No dose-limiting toxicities.  Will proceed with cycle 9 of atezolizumab  today and skip bevacizumab  with current cycle and for next cycle to see if that will help with her gum bleeding.  RTC in 3 weeks for labs, follow-up, cycle 10 of treatment.   - Switch to durvalumab or other agent if disease shows signs of worsening  Chronic viral hepatitis B without delta-agent (HCC) Chronic hepatitis B infection contributing to hepatocellular carcinoma.   She is currently on tenofovir .  Follows up with Dr. Fleeta Rothman at ID clinic.  - Monitor liver function tests with each visit and ammonia levels as indicated  Gingival bleeding Intermittent gingival bleeding, from periodontal disease.  She  has established with a dentist.  She was given clearance to proceed with any dental procedures that would help  with gingival bleeding.  It has improved compared to prior.  Will continue to hold Avastin  for now    I reviewed lab results and outside records for this visit and discussed relevant results with the patient. Diagnosis, plan of care and treatment options were also discussed in detail with the patient. Opportunity provided to ask questions and answers provided to her apparent satisfaction. Provided instructions to call our clinic with any problems, questions or concerns prior to return visit. I recommended to continue follow-up with PCP and sub-specialists. She verbalized understanding and agreed with the plan.   NCCN guidelines have been consulted in the planning of this patient's care.  I spent a total of 30 minutes during this encounter with the patient including review of chart and various tests results, discussions about plan of care and coordination of care plan.  Chinita Patten, MD  02/26/2024 5:10 PM  [facility] - A DEPT OF [facility] [address] [place] [address] Dept: [phone] Dept Fax: [phone]    CHIEF COMPLAINT/ REASON FOR VISIT:   Multifocal hepatocellular carcinoma, diagnosed based on MRI findings.  Current Treatment: Systemic treatment with atezolizumab  plus bevacizumab  starting from 09/11/2023.  INTERVAL HISTORY:    Discussed the use of AI scribe software for clinical note transcription with the patient, who gave verbal consent to proceed.  History of Present Illness  Christina Gutierrez is a 48 year old female who presents for follow-up.   She was recently hospitalized due to dizziness and generalized body aches, which she attributes to a possible nutritional deficiency. During this time, she experienced significant difficulty walking due to body pain. She now feels better and denies current body aches.  She continues to experience gum bleeding, although it has decreased in  frequency and severity, now occurring primarily at night.  She occasionally experiences nausea, but it is infrequent and not persistent.  She has run out of her numbing medication for her port.  No current body aches and reports occasional nausea without vomiting.    I have reviewed the past medical history, past surgical history, social history and family history with the patient and they are unchanged from previous note.  HISTORY OF PRESENT ILLNESS:   ONCOLOGY HISTORY:   48 y.o. Hispanic lady with no significant past medical history, presented to the emergency department on 08/21/2023 with complaints of right-sided abdominal pain, nausea, vomiting and decreased oral intake for at least 10 days prior to arrival.   On arrival to ED, she was found to have anemia with hemoglobin of 8, white count normal at 6100, platelet count 316,000.  CMP showed elevated AST of 80, ALT increased at 79, bilirubin elevated at 1.8.  Elevated ammonia level of 62.   Right upper quadrant ultrasound showed large heterogenoussolid mass within the liver measuring up to 16.9 cm, suspicious for malignancy. Recommend further evaluation with contrast-enhanced CT or MRI.   CT abdomen pelvis showed: 15.5 x 12.5 cm solid well-circumscribed mass within the right hepatic lobe. Areas of central low-density suggest possibility of central scar. There may be a large feeding vessel. Favor focal nodular hyperplasia although fibrolamellar HCC can have a similar appearance. Recommend further evaluation MRI without and with contrast.   Left spontaneous splenorenal shunt suggest portal venous hypertension. No evidence for cirrhosis. This may be related to  mass effect and compression of the portal vein from the large mass.   She was recently diagnosed with chickenpox about 20 days prior to this hospitalization.  Still had some itching from vesicles.   She was admitted for further evaluation and management.   MRI of the abdomen with  liver protocol on 08/21/2023 showed numerous hepatic lesions are noted, with a dominant lesion which replaces much of the right lobe of the liver estimated to measure approximately 16.5 x 12.7 x 17.7 cm. This lesion is heterogeneous in signal intensity on T1 and T2 weighted images, but clearly has internal areas of hypervascular enhancement on early phase post gadolinium imaging, with persistent low-level enhancement on more delayed imaging. The other dominant lesion is centered in the superior aspect of the liver, likely within the superior aspect of segment 4A estimated to measure approximately 8.1 x 6.8 x 7.8 cm, with similar imaging characteristics to the previously described lesion, although with greater degree of internal washout and probable pseudo capsule on delayed imaging. Multiple other smaller hypervascular lesions are apparent on arterial phase imaging throughout all aspects of the liver, some of which demonstrate delayed washout.   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   On 08/21/2023, AFP was significantly elevated at 32,160 ng/mL.     She was also diagnosed with hepatitis B infection on additional workup.   CT chest on 08/23/2023 showed no evidence of intrathoracic metastatic disease.   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  Her case was discussed in GI tumor conference on 09/09/2023.  Reviewed images with the team and confirmed multifocal hepatocellular carcinoma.   Plan made for systemic treatments with atezolizumab  plus bevacizumab .  Started from 09/11/2023.  Restaging MRI of the liver on 12/14/2023 showed stable disease without evidence of disease progression or new disease.  Plan to continue current management.  However given progressive gum bleeding, Avastin  was held from 01/15/2024.  Oncology History  Hepatocellular carcinoma (HCC)  08/22/2023 Initial Diagnosis   Hepatocellular carcinoma (HCC)   08/27/2023 Cancer Staging   Staging form: Liver, AJCC 8th  Edition - Clinical: Stage IIIA (cT3, cN0, cM0) - Signed by [name], MD on 08/27/2023   09/11/2023 -  Chemotherapy   Patient is on Treatment Plan : HEPATOCELLULAR Atezolizumab  + Bevacizumab  q21d         REVIEW OF SYSTEMS:   Review of Systems - Oncology  All other pertinent systems were reviewed with the patient and are negative.  ALLERGIES: She has no known allergies.  MEDICATIONS:  Current Outpatient Medications  Medication Sig Dispense Refill   ALPRAZolam  (XANAX ) 0.5 MG tablet Take 1 tablet (0.5 mg total) by mouth at bedtime as needed for anxiety. 30 tablet 0   ondansetron  (ZOFRAN ) 8 MG tablet Take 1 tablet (8 mg total) by mouth every 8 (eight) hours as needed for nausea or vomiting. 60 tablet 2   phytonadione  (VITAMIN K) 5 MG tablet Take 2 tablets (10 mg total) by mouth daily. (Patient taking differently: Take 10 mg by mouth daily. Pt reports all done) 6 tablet 0   prochlorperazine  (COMPAZINE ) 10 MG tablet Take 1 tablet (10 mg total) by mouth every 6 (six) hours as needed for nausea or vomiting. 30 tablet 1   tenofovir  alafenamide (VEMLIDY ) 25 MG tablet Take 1 tablet (25 mg total) by mouth daily. 30 tablet 11   traMADol  (ULTRAM ) 50 MG tablet Take 1 tablet (50 mg total) by mouth every 6 (six) hours as needed for severe pain (pain  score 7-10) or moderate pain (pain score 4-6). 50 tablet 0   lidocaine -prilocaine  (EMLA ) cream Apply 1 Application topically as needed. 30 g 3   pantoprazole  (PROTONIX ) 40 MG tablet Take 1 tablet (40 mg total) by mouth daily at 6 (six) AM. 30 tablet 1   No current facility-administered medications for this visit.   Facility-Administered Medications Ordered in Other Visits  Medication Dose Route Frequency Provider Last Rate Last Admin   0.9 %  sodium chloride  infusion   Intravenous Continuous Jasim Harari, MD   Stopped at 02/26/24 1156   sodium chloride  flush (NS) 0.9 % injection 10 mL  10 mL Intracatheter PRN Aaryav Hopfensperger, MD         VITALS:    Blood pressure 105/61, pulse 75, temperature 97.9 F (36.6 C), temperature source Temporal, resp. rate 17, height 5' (1.524 m), weight 108 lb (49 kg), SpO2 100%.  Wt Readings from Last 3 Encounters:  02/26/24 108 lb (49 kg)  02/11/24 111 lb 12.8 oz (50.7 kg)  02/11/24 111 lb 12.8 oz (50.7 kg)    Body mass index is 21.09 kg/m.    Onc Performance Status - 02/26/24 1004       ECOG Perf Status   ECOG Perf Status Ambulatory and capable of all selfcare but unable to carry out any work activities.  Up and about more than 50% of waking hours      KPS SCALE   KPS % SCORE Normal activity with effort, some s/s of disease            PHYSICAL EXAM:   Physical Exam Constitutional:      General: She is not in acute distress.    Appearance: Normal appearance.  HENT:     Head: Normocephalic and atraumatic.  Eyes:     General: No scleral icterus.    Conjunctiva/sclera: Conjunctivae normal.  Cardiovascular:     Rate and Rhythm: Normal rate and regular rhythm.     Heart sounds: Normal heart sounds.  Pulmonary:     Effort: Pulmonary effort is normal.     Breath sounds: Normal breath sounds.  Abdominal:     Palpations: There is mass (stable hepatomegaly).  Musculoskeletal:     Right lower leg: No edema.     Left lower leg: No edema.  Neurological:     General: No focal deficit present.     Mental Status: She is alert.  Psychiatric:        Mood and Affect: Mood normal.        Behavior: Behavior normal.      LABORATORY DATA:   I have reviewed the data as listed.  Results for orders placed or performed in visit on 02/26/24  Total Protein, Urine dipstick  Result Value Ref Range   Protein, ur NEGATIVE NEGATIVE mg/dL  TSH  Result Value Ref Range   TSH 2.780 0.350 - 4.500 uIU/mL  CMP (Cancer Center only)  Result Value Ref Range   Sodium 137 135 - 145 mmol/L   Potassium 3.6 3.5 - 5.1 mmol/L   Chloride 103 98 - 111 mmol/L   CO2 26 22 - 32 mmol/L   Glucose, Bld 103 (H)  70 - 99 mg/dL   BUN 7 6 - 20 mg/dL   Creatinine 9.54 9.55 - 1.00 mg/dL   Calcium 9.2 8.9 - 89.6 mg/dL   Total Protein 8.1 6.5 - 8.1 g/dL   Albumin 3.9 3.5 - 5.0 g/dL   AST 742 (HH) 15 -  41 U/L   ALT 148 (H) 0 - 44 U/L   Alkaline Phosphatase 93 38 - 126 U/L   Total Bilirubin 1.1 0.0 - 1.2 mg/dL   GFR, Estimated >39 >39 mL/min   Anion gap 8 5 - 15  CBC with Differential (Cancer Center Only)  Result Value Ref Range   WBC Count 5.5 4.0 - 10.5 K/uL   RBC 3.01 (L) 3.87 - 5.11 MIL/uL   Hemoglobin 8.2 (L) 12.0 - 15.0 g/dL   HCT 75.7 (L) 63.9 - 53.9 %   MCV 80.4 80.0 - 100.0 fL   MCH 27.2 26.0 - 34.0 pg   MCHC 33.9 30.0 - 36.0 g/dL   RDW 78.3 (H) 88.4 - 84.4 %   Platelet Count 303 150 - 400 K/uL   nRBC 0.0 0.0 - 0.2 %   Neutrophils Relative % 47 %   Neutro Abs 2.6 1.7 - 7.7 K/uL   Lymphocytes Relative 29 %   Lymphs Abs 1.6 0.7 - 4.0 K/uL   Monocytes Relative 9 %   Monocytes Absolute 0.5 0.1 - 1.0 K/uL   Eosinophils Relative 13 %   Eosinophils Absolute 0.7 (H) 0.0 - 0.5 K/uL   Basophils Relative 2 %   Basophils Absolute 0.1 0.0 - 0.1 K/uL   Immature Granulocytes 0 %   Abs Immature Granulocytes 0.01 0.00 - 0.07 K/uL     RADIOGRAPHIC STUDIES:  Reviewed MRI from 12/14/2023 which showed stable disease.  CODE STATUS:  Code Status History     Date Active Date Inactive Code Status Order ID Comments User Context   08/29/2023 0409 08/31/2023 1925 Full Code [id number]  Shona Terry SAILOR, DO ED   08/21/2023 0400 08/23/2023 2127 Full Code [id number]  Lee Kingfisher, MD ED    Questions for Most Recent Historical Code Status (Order [id number])     Question Answer   By: Consent: discussion documented in EHR           Future Appointments  Date Time Provider Department Center  03/18/2024  9:15 AM [facility] FLUSH [facility] None  03/18/2024  9:45 AM Temekia Caskey, Chinita, MD [facility] None  03/18/2024 10:30 AM [facility] INFUSION [facility] None     This document was completed utilizing  speech recognition software. Grammatical errors, random word insertions, pronoun errors, and incomplete sentences are an occasional consequence of this system due to software limitations, ambient noise, and hardware issues. Any formal questions or concerns about the content, text or information contained within the body of this dictation should be directly addressed to the provider for clarification.

## 2024-02-26 NOTE — Assessment & Plan Note (Addendum)
 Please review oncology history for additional details and timeline of events.  MRI of the abdomen with liver protocol on 08/21/2023 showed numerous hepatic lesions are noted, with a dominant lesion which replaces much of the right lobe of the liver estimated to measure approximately 16.5 x 12.7 x 17.7 cm. This lesion is heterogeneous in signal intensity on T1 and T2 weighted images, but clearly has internal areas of hypervascular enhancement on early phase post gadolinium imaging, with persistent low-level enhancement on more delayed imaging. The other dominant lesion is centered in the superior aspect of the liver, likely within the superior aspect of segment 4A estimated to measure approximately 8.1 x 6.8 x 7.8 cm, with similar imaging characteristics to the previously described lesion, although with greater degree of internal washout and probable pseudo capsule on delayed imaging. Multiple other smaller hypervascular lesions are apparent on arterial phase imaging throughout all aspects of the liver, some of which demonstrate delayed washout.   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  This was confirmed during GI tumor conference on 09/09/2023.  -Previously undiagnosed hepatitis B could have been the contributing factor. -Not a candidate for liver transplant or surgical resection because of the extent of disease. -Discussed diagnosis, prognosis, plan of care, treatment options.  Reviewed NCCN guidelines.   On 08/21/2023, AFP was significantly elevated at 32,160 ng/mL.     CT chest on 08/23/2023 showed no evidence of intrathoracic metastatic disease.  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  Plan made for systemic treatments with atezolizumab  plus bevacizumab .  Started this from 09/11/2023.  She has been tolerating treatments well without any major side effects.  Restaging MRI of the liver on 12/14/2023 showed stable disease without evidence of disease progression or new disease.  She  had CT abdomen and pelvis on 02/12/2024 when she was hospitalized and it also showed stable disease.  Plan to continue current management.  However given progressive gum bleeding, we will hold bevacizumab  to see if that would help improve her symptoms.  Last dose of Avastin  was on 12/25/2023.  Labs today reveal no dose-limiting toxicities.  Persistent elevated LFTs, likely from hepatitis B and from hepatocellular carcinoma.  No dose-limiting toxicities.  Will proceed with cycle 9 of atezolizumab  today and skip bevacizumab  with current cycle and for next cycle to see if that will help with her gum bleeding.  RTC in 3 weeks for labs, follow-up, cycle 10 of treatment.   - Switch to durvalumab or other agent if disease shows signs of worsening

## 2024-02-26 NOTE — Progress Notes (Signed)
 Patient seen by Dr. Chinita Pasam today  Vitals are within treatment parameters:Yes   Labs are within treatment parameters: No (Please specify and give further instructions.)   Treatment plan has been signed: Yes   Per physician team, Patient is ready for treatment and there are NO modifications to the treatment plan.   Per Dr. Autumn:  AST is 257 and ALT 148 please proceed with treatment.

## 2024-02-26 NOTE — Patient Instructions (Signed)
 Instrucciones al darle de alta: Discharge Instructions Gracias por elegir al [facility] de Cncer de [place] para [place] atencin mdica de oncologa y Teacher, English as a foreign language.   Si usted tiene una cita de laboratorio con el Cent[facility] de Cncer, por favor vaya directamente al [employer] de Cncer y regstrese en el rea de Engineer, maintenance (IT).   Use ropa cmoda y [place] para tener fcil acceso a las vas del Portacath (acceso venoso de larga duracin) o la lnea PICC (catter central colocado por va perifrica).   Nos esforzamos por ofrecerle tiempo de calidad con su proveedor. Es posible que tenga que volver a programar su cita si llega tarde (15 minutos o ms).  El llegar tarde le afecta a usted y a otros pacientes cuyas citas son posteriores a Armed forces operational officer.  Adems, si usted falta a tres o ms citas sin avisar a la oficina, puede ser retirado(a) de la clnica a discrecin del proveedor.      Para las solicitudes de renovacin de recetas, pida a su farmacia que se ponga en contacto con nuestra oficina y deje que transcurran 72 horas para que se complete el proceso de las renovaciones.    Hoy usted recibi los siguientes agentes de quimioterapia e/o inmunoterapia: atezolizumab       Para ayudar a prevenir las nuseas y los vmitos despus de su tratamiento, le recomendamos que tome su medicamento para las nuseas segn las indicaciones.  LOS SNTOMAS QUE DEBEN COMUNICARSE INMEDIATAMENTE SE INDICAN A CONTINUACIN: *FIEBRE SUPERIOR A 100.4 F (38 C) O MS *ESCALOFROS O SUDORACIN *NUSEAS Y VMITOS QUE NO SE CONTROLAN CON EL MEDICAMENTO PARA LAS NUSEAS *DIFICULTAD INUSUAL PARA RESPIRAR  *MORETONES O HEMORRAGIAS NO HABITUALES *PROBLEMAS URINARIOS (dolor o ardor al Geographical information systems officer o frecuencia para Geographical information systems officer) *PROBLEMAS INTESTINALES (diarrea inusual, estreimiento, dolor cerca del ano) SENSIBILIDAD EN LA BOCA Y EN LA GARGANTA CON O SIN LA PRESENCIA DE LCERAS (dolor de garganta, llagas en la boca o dolor de muelas/dientes) ERUPCIN,  HINCHAZN O DOLORES INUSUALES FLUJO VAGINAL INUSUAL O PICAZN/RASQUIA    Los puntos marcados con un asterisco ( *) indican una posible emergencia y debe hacer un seguimiento tan pronto como le sea posible o vaya al Departamento de Emergencias si se le presenta algn problema.  Por favor, muestre la [place] DE ADVERTENCIA DE BABS [place] DE ADVERTENCIA DE LTANYA al registrarse en [address] de Emergencias y a la enfermera de triaje.  Si tiene preguntas despus de su visita o necesita cancelar o volver a programar su cita, por favor pngase en contacto con [facility]  Dept: [phone] y siga las instrucciones. Las horas de oficina son de 8:00 a.m. a 4:30 p.m. de lunes a viernes. Por favor, tenga en cuenta que los mensajes de voz que se dejan despus de las 4:00 p.m. posiblemente no se devolvern hasta el siguiente da de trabajo.  Cerramos los fines de semana y [employer]. En todo momento tiene acceso a una enfermera para preguntas urgentes. Por favor, llame al nmero principal de la clnica Dept: [phone] y siga las instrucciones.   Para cualquier pregunta que no sea de carcter urgente, tambin puede ponerse en contacto con su proveedor utilizando MyChart. Ahora ofrecemos visitas electrnicas para cualquier persona mayor de 18 aos que solicite atencin mdica en lnea para los sntomas que no sean urgentes. Para ms detalles vaya a [email or web address].   Tambin puede bajar la aplicacin de MyChart! Vaya a la  tienda de aplicaciones, busque MyChart, abra la aplicacin, seleccione [place], e ingrese con su nombre de usuario y la contrasea de [name].

## 2024-02-26 NOTE — Progress Notes (Signed)
 CRITICAL VALUE STICKER  Alert VALUE:  AST 257  RECEIVER (on-site recipient of call):  Keene Crown, RN  DATE & TIME NOTIFIED:   02/26/24  1029  MESSENGER (representative from lab): Amber  MD NOTIFIED:  Yes, Dr. Autumn  TIME OF NOTIFICATION: 1030  RESPONSE:  Aware and will proceed with oncology tx

## 2024-02-26 NOTE — Assessment & Plan Note (Addendum)
 Intermittent gingival bleeding, from periodontal disease.  She has established with a dentist.  She was given clearance to proceed with any dental procedures that would help with gingival bleeding.  It has improved compared to prior.  Will continue to hold Avastin  for now

## 2024-02-26 NOTE — Assessment & Plan Note (Signed)
 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   She is currently on tenofovir.  Follows up with Dr. Daiva Eves at ID clinic.  - Monitor liver function tests with each visit and ammonia levels as indicated

## 2024-02-27 LAB — T4: T4, Total: 11.8 ug/dL (ref 4.5–12.0)

## 2024-03-05 ENCOUNTER — Other Ambulatory Visit: Payer: Self-pay

## 2024-03-05 ENCOUNTER — Emergency Department (HOSPITAL_COMMUNITY)
Admission: EM | Admit: 2024-03-05 | Discharge: 2024-03-06 | Disposition: A | Discharge status: Home / Self Care | Payer: Self-pay | Attending: Emergency Medicine | Admitting: Emergency Medicine

## 2024-03-05 ENCOUNTER — Emergency Department (HOSPITAL_COMMUNITY): Payer: Self-pay

## 2024-03-05 ENCOUNTER — Encounter (HOSPITAL_COMMUNITY): Payer: Self-pay | Admitting: Emergency Medicine

## 2024-03-05 DIAGNOSIS — R102 Pelvic and perineal pain: Secondary | ICD-10-CM | POA: Insufficient documentation

## 2024-03-05 DIAGNOSIS — C220 Liver cell carcinoma: Secondary | ICD-10-CM | POA: Insufficient documentation

## 2024-03-05 MED ORDER — ONDANSETRON HCL 4 MG/2ML IJ SOLN
4.0000 mg | Freq: Once | INTRAMUSCULAR | Status: AC
  Administered 2024-03-06: 4 mg via INTRAVENOUS
  Filled 2024-03-05: qty 2

## 2024-03-05 MED ORDER — MORPHINE SULFATE (PF) 4 MG/ML IV SOLN
4.0000 mg | Freq: Once | INTRAVENOUS | Status: AC
  Administered 2024-03-06: 4 mg via INTRAVENOUS
  Filled 2024-03-05: qty 1

## 2024-03-05 NOTE — ED Triage Notes (Addendum)
 Patient presents due to general body aches, but mainly from the left side of her lower back into her leg. She also complains of rectal pain. Symptoms started yesterday morning. She is unsure what may have caused the pain. Her last chemo was last week for liver cancer.

## 2024-03-05 NOTE — ED Provider Notes (Signed)
 [place] EMERGENCY DEPARTMENT AT [facility] Provider Note   CSN: [id number] Arrival date & time: 03/05/24  1946     Patient presents with: Back Pain and Generalized Body Aches   Christina Gutierrez is a 48 y.o. female.  {Add pertinent medical, surgical, social history, OB history to HPI:32947} HPI     Spanish interpreter used for encounter.  This is a 48 year old female with history of cancer who presents with pain.  Patient has a history of hepatocellular carcinoma and is getting ongoing chemotherapy.  Patient states that yesterday she had onset of severe back and pelvis pain that radiates down to the both feet.  She has not noted any weakness or difficulty with bowel or bladder.  Pain is refractory to has tramadol  at home.  She states that baseline she has issues with constipation and states that this still is the case.  She has not had any abdominal pain chest pain, shortness of breath.  Prior to Admission medications   Medication Sig Start Date End Date Taking? Authorizing Provider  ALPRAZolam  (XANAX ) 0.5 MG tablet Take 1 tablet (0.5 mg total) by mouth at bedtime as needed for anxiety. 02/05/24   Boscia, Heather E, NP  lidocaine -prilocaine  (EMLA ) cream Apply 1 Application topically as needed. 02/26/24   Pasam, Chinita, MD  ondansetron  (ZOFRAN ) 8 MG tablet Take 1 tablet (8 mg total) by mouth every 8 (eight) hours as needed for nausea or vomiting. 01/15/24   Pasam, Chinita, MD  pantoprazole  (PROTONIX ) 40 MG tablet Take 1 tablet (40 mg total) by mouth daily at 6 (six) AM. 02/26/24   Pasam, Avinash, MD  phytonadione  (VITAMIN K) 5 MG tablet Take 2 tablets (10 mg total) by mouth daily. Patient taking differently: Take 10 mg by mouth daily. Pt reports all done 12/25/23   Pasam, Chinita, MD  prochlorperazine  (COMPAZINE ) 10 MG tablet Take 1 tablet (10 mg total) by mouth every 6 (six) hours as needed for nausea or vomiting. 02/11/24   Hanford Powell BRAVO, NP  tenofovir  alafenamide  (VEMLIDY ) 25 MG tablet Take 1 tablet (25 mg total) by mouth daily. 09/22/23   Fleeta Kathie Jomarie LOISE, MD  traMADol  (ULTRAM ) 50 MG tablet Take 1 tablet (50 mg total) by mouth every 6 (six) hours as needed for severe pain (pain score 7-10) or moderate pain (pain score 4-6). 02/05/24   Boscia, Heather E, NP    Allergies: Patient has no known allergies.    Review of Systems  Constitutional:  Negative for fever.  Respiratory:  Negative for shortness of breath.   Cardiovascular:  Negative for chest pain.  Gastrointestinal:  Positive for constipation. Negative for abdominal pain.  Musculoskeletal:  Positive for back pain.  All other systems reviewed and are negative.   Updated Vital Signs BP 103/68   Pulse 84   Temp 98.1 F (36.7 C) (Oral)   Resp 18   SpO2 96%   Physical Exam Vitals and nursing note reviewed.  Constitutional:      Appearance: She is well-developed. She is not ill-appearing.     Comments: Chronically ill-appearing, no acute distress  HENT:     Head: Normocephalic and atraumatic.  Eyes:     Pupils: Pupils are equal, round, and reactive to light.  Cardiovascular:     Rate and Rhythm: Normal rate and regular rhythm.     Heart sounds: Normal heart sounds.  Pulmonary:     Effort: Pulmonary effort is normal. No respiratory distress.     Breath  sounds: No wheezing.  Abdominal:     Palpations: Abdomen is soft.     Tenderness: There is no abdominal tenderness. There is no guarding or rebound.  Musculoskeletal:     Cervical back: Neck supple.     Comments: Tenderness palpation lower lumbar spine, no step-off deformity noted  Skin:    General: Skin is warm and dry.  Neurological:     Mental Status: She is alert and oriented to person, place, and time.     Comments: 5 out of 5 strength bilateral lower extremities, equal patellar reflexes bilaterally  Psychiatric:        Mood and Affect: Mood normal.     (all labs ordered are listed, but only abnormal results are  displayed) Labs Reviewed  CBC WITH DIFFERENTIAL/PLATELET  COMPREHENSIVE METABOLIC PANEL WITH GFR  URINALYSIS, ROUTINE W REFLEX MICROSCOPIC    EKG: None  Radiology: No results found.  {Document cardiac monitor, telemetry assessment procedure when appropriate:32947} Procedures   Medications Ordered in the ED  morphine  (PF) 4 MG/ML injection 4 mg (has no administration in time range)  ondansetron  (ZOFRAN ) injection 4 mg (has no administration in time range)      {Click here for ABCD2, HEART and other calculators REFRESH Note before signing:1}                              Medical Decision Making Amount and/or Complexity of Data Reviewed Labs: ordered. Radiology: ordered.  Risk Prescription drug management.   ***  {Document critical care time when appropriate  Document review of labs and clinical decision tools ie CHADS2VASC2, etc  Document your independent review of radiology images and any outside records  Document your discussion with family members, caretakers and with consultants  Document social determinants of health affecting pt's care  Document your decision making why or why not admission, treatments were needed:32947:::1}   Final diagnoses:  None    ED Discharge Orders     None

## 2024-03-06 ENCOUNTER — Emergency Department (HOSPITAL_COMMUNITY): Payer: Self-pay

## 2024-03-06 LAB — CBC WITH DIFFERENTIAL/PLATELET
Abs Immature Granulocytes: 0.01 K/uL (ref 0.00–0.07)
Basophils Absolute: 0.2 K/uL — ABNORMAL HIGH (ref 0.0–0.1)
Basophils Relative: 2 %
Eosinophils Absolute: 0.6 K/uL — ABNORMAL HIGH (ref 0.0–0.5)
Eosinophils Relative: 10 %
HCT: 23 % — ABNORMAL LOW (ref 36.0–46.0)
Hemoglobin: 7.5 g/dL — ABNORMAL LOW (ref 12.0–15.0)
Immature Granulocytes: 0 %
Lymphocytes Relative: 35 %
Lymphs Abs: 2.1 K/uL (ref 0.7–4.0)
MCH: 27.1 pg (ref 26.0–34.0)
MCHC: 32.6 g/dL (ref 30.0–36.0)
MCV: 83 fL (ref 80.0–100.0)
Monocytes Absolute: 0.7 K/uL (ref 0.1–1.0)
Monocytes Relative: 11 %
Neutro Abs: 2.6 K/uL (ref 1.7–7.7)
Neutrophils Relative %: 42 %
Platelets: 286 K/uL (ref 150–400)
RBC: 2.77 MIL/uL — ABNORMAL LOW (ref 3.87–5.11)
RDW: 23.3 % — ABNORMAL HIGH (ref 11.5–15.5)
WBC: 6.2 K/uL (ref 4.0–10.5)
nRBC: 0 % (ref 0.0–0.2)

## 2024-03-06 LAB — COMPREHENSIVE METABOLIC PANEL WITH GFR
ALT: 172 U/L — ABNORMAL HIGH (ref 0–44)
AST: 313 U/L — ABNORMAL HIGH (ref 15–41)
Albumin: 3.8 g/dL (ref 3.5–5.0)
Alkaline Phosphatase: 109 U/L (ref 38–126)
Anion gap: 11 (ref 5–15)
BUN: 6 mg/dL (ref 6–20)
CO2: 22 mmol/L (ref 22–32)
Calcium: 9.4 mg/dL (ref 8.9–10.3)
Chloride: 103 mmol/L (ref 98–111)
Creatinine, Ser: 0.58 mg/dL (ref 0.44–1.00)
GFR, Estimated: >60 mL/min (ref >60)
Glucose, Bld: 104 mg/dL — ABNORMAL HIGH (ref 70–99)
Potassium: 4.2 mmol/L (ref 3.5–5.1)
Sodium: 136 mmol/L (ref 135–145)
Total Bilirubin: 0.9 mg/dL (ref 0.0–1.2)
Total Protein: 8.1 g/dL (ref 6.5–8.1)

## 2024-03-06 LAB — URINALYSIS, ROUTINE W REFLEX MICROSCOPIC
Bilirubin Urine: NEGATIVE
Glucose, UA: NEGATIVE mg/dL
Hgb urine dipstick: NEGATIVE
Ketones, ur: NEGATIVE mg/dL
Leukocytes,Ua: NEGATIVE
Nitrite: NEGATIVE
Protein, ur: NEGATIVE mg/dL
Specific Gravity, Urine: 1.011 (ref 1.005–1.030)
pH: 8 (ref 5.0–8.0)

## 2024-03-06 LAB — PREGNANCY, URINE: Preg Test, Ur: NEGATIVE

## 2024-03-06 MED ORDER — IOHEXOL 300 MG/ML  SOLN
100.0000 mL | Freq: Once | INTRAMUSCULAR | Status: AC | PRN
  Administered 2024-03-06: 100 mL via INTRAVENOUS

## 2024-03-06 NOTE — Discharge Instructions (Signed)
 You were seen today for your pelvic and hip pain.  Follow-up with your oncologist.  Your CT imaging does show extensive cancer related disease.  If you develop nausea or vomiting, you would need to be reevaluated as you are at high risk for bowel obstruction.  Hoy lo revisaron por dolor plvico y de cadera. Acuda a una cita de seguimiento con su onclogo. La tomografa computarizada muestra una enfermedad extensa relacionada con el cncer. Si presenta nuseas o vmitos, deber ser [facility], ya que tiene un alto riesgo de obstruccin intestinal.

## 2024-03-10 ENCOUNTER — Other Ambulatory Visit: Payer: Self-pay

## 2024-03-10 ENCOUNTER — Emergency Department (HOSPITAL_COMMUNITY)
Admission: EM | Admit: 2024-03-10 | Discharge: 2024-03-10 | Disposition: A | Discharge status: Home / Self Care | Payer: Self-pay | Attending: Emergency Medicine | Admitting: Emergency Medicine

## 2024-03-10 ENCOUNTER — Encounter (HOSPITAL_COMMUNITY): Payer: Self-pay

## 2024-03-10 DIAGNOSIS — R110 Nausea: Secondary | ICD-10-CM | POA: Insufficient documentation

## 2024-03-10 DIAGNOSIS — C220 Liver cell carcinoma: Secondary | ICD-10-CM | POA: Insufficient documentation

## 2024-03-10 DIAGNOSIS — R531 Weakness: Secondary | ICD-10-CM

## 2024-03-10 DIAGNOSIS — I10 Essential (primary) hypertension: Secondary | ICD-10-CM | POA: Insufficient documentation

## 2024-03-10 DIAGNOSIS — D649 Anemia, unspecified: Secondary | ICD-10-CM | POA: Insufficient documentation

## 2024-03-10 DIAGNOSIS — E860 Dehydration: Secondary | ICD-10-CM | POA: Insufficient documentation

## 2024-03-10 DIAGNOSIS — R7401 Elevation of levels of liver transaminase levels: Secondary | ICD-10-CM | POA: Insufficient documentation

## 2024-03-10 HISTORY — DX: Malignant (primary) neoplasm, unspecified: C80.1

## 2024-03-10 LAB — CBC WITH DIFFERENTIAL/PLATELET
Abs Immature Granulocytes: 0.02 K/uL (ref 0.00–0.07)
Basophils Absolute: 0.1 K/uL (ref 0.0–0.1)
Basophils Relative: 2 %
Eosinophils Absolute: 0.5 K/uL (ref 0.0–0.5)
Eosinophils Relative: 9 %
HCT: 23.7 % — ABNORMAL LOW (ref 36.0–46.0)
Hemoglobin: 7.6 g/dL — ABNORMAL LOW (ref 12.0–15.0)
Immature Granulocytes: 0 %
Lymphocytes Relative: 25 %
Lymphs Abs: 1.5 K/uL (ref 0.7–4.0)
MCH: 26.3 pg (ref 26.0–34.0)
MCHC: 32.1 g/dL (ref 30.0–36.0)
MCV: 82 fL (ref 80.0–100.0)
Monocytes Absolute: 0.6 K/uL (ref 0.1–1.0)
Monocytes Relative: 11 %
Neutro Abs: 3.2 K/uL (ref 1.7–7.7)
Neutrophils Relative %: 53 %
Platelets: 301 K/uL (ref 150–400)
RBC: 2.89 MIL/uL — ABNORMAL LOW (ref 3.87–5.11)
RDW: 23.9 % — ABNORMAL HIGH (ref 11.5–15.5)
Smear Review: NORMAL
WBC: 6 K/uL (ref 4.0–10.5)
nRBC: 0 % (ref 0.0–0.2)

## 2024-03-10 LAB — COMPREHENSIVE METABOLIC PANEL WITH GFR
ALT: 147 U/L — ABNORMAL HIGH (ref 0–44)
AST: 269 U/L — ABNORMAL HIGH (ref 15–41)
Albumin: 3.8 g/dL (ref 3.5–5.0)
Alkaline Phosphatase: 108 U/L (ref 38–126)
Anion gap: 13 (ref 5–15)
BUN: 8 mg/dL (ref 6–20)
CO2: 22 mmol/L (ref 22–32)
Calcium: 9.4 mg/dL (ref 8.9–10.3)
Chloride: 102 mmol/L (ref 98–111)
Creatinine, Ser: 0.54 mg/dL (ref 0.44–1.00)
GFR, Estimated: >60 mL/min (ref >60)
Glucose, Bld: 112 mg/dL — ABNORMAL HIGH (ref 70–99)
Potassium: 4.2 mmol/L (ref 3.5–5.1)
Sodium: 137 mmol/L (ref 135–145)
Total Bilirubin: 0.8 mg/dL (ref 0.0–1.2)
Total Protein: 8.1 g/dL (ref 6.5–8.1)

## 2024-03-10 LAB — CBC
HCT: 23.6 % — ABNORMAL LOW (ref 36.0–46.0)
Hemoglobin: 7.6 g/dL — ABNORMAL LOW (ref 12.0–15.0)
MCH: 26.7 pg (ref 26.0–34.0)
MCHC: 32.2 g/dL (ref 30.0–36.0)
MCV: 82.8 fL (ref 80.0–100.0)
Platelets: 322 K/uL (ref 150–400)
RBC: 2.85 MIL/uL — ABNORMAL LOW (ref 3.87–5.11)
RDW: 24.1 % — ABNORMAL HIGH (ref 11.5–15.5)
WBC: 7 K/uL (ref 4.0–10.5)
nRBC: 0 % (ref 0.0–0.2)

## 2024-03-10 LAB — HCG, SERUM, QUALITATIVE: Preg, Serum: NEGATIVE

## 2024-03-10 LAB — CBG MONITORING, ED: Glucose-Capillary: 103 mg/dL — ABNORMAL HIGH (ref 70–99)

## 2024-03-10 MED ORDER — SODIUM CHLORIDE 0.9 % IV BOLUS
1000.0000 mL | Freq: Once | INTRAVENOUS | Status: AC
  Administered 2024-03-10: 1000 mL via INTRAVENOUS

## 2024-03-10 MED ORDER — MORPHINE SULFATE (PF) 4 MG/ML IV SOLN
4.0000 mg | Freq: Once | INTRAVENOUS | Status: AC
  Administered 2024-03-10: 4 mg via INTRAVENOUS
  Filled 2024-03-10: qty 1

## 2024-03-10 MED ORDER — ONDANSETRON HCL 4 MG/2ML IJ SOLN
4.0000 mg | Freq: Once | INTRAMUSCULAR | Status: AC
  Administered 2024-03-10: 4 mg via INTRAVENOUS
  Filled 2024-03-10: qty 2

## 2024-03-10 NOTE — ED Triage Notes (Signed)
 Pt has been having generalized weakness, pain all over and a headache for the past 3 days, pt is a CA pt and receiving chemo

## 2024-03-10 NOTE — ED Provider Notes (Signed)
 [place] EMERGENCY DEPARTMENT AT [facility] Provider Note   CSN: [id number] Arrival date & time: 03/10/24  2008     Patient presents with: Weakness   Christina Gutierrez is a 48 y.o. female with past medical history significant for hepatocellular carcinoma currently undergoing chemotherapy, portal venous hypertension who presents with concern for cancer pain, nausea, vomiting, generalized weakness, pain all over.  She reports that it has been worse since she started a new chemotherapy drug 2 weeks ago.  She denies any chest pain, shortness of breath, fever, chills.  She reports that she is chronically dehydrated and was told to come here for fluids.    Weakness      Prior to Admission medications   Medication Sig Start Date End Date Taking? Authorizing Provider  ALPRAZolam  (XANAX ) 0.5 MG tablet Take 1 tablet (0.5 mg total) by mouth at bedtime as needed for anxiety. 02/05/24   Boscia, Heather E, NP  lidocaine -prilocaine  (EMLA ) cream Apply 1 Application topically as needed. 02/26/24   Pasam, Chinita, MD  ondansetron  (ZOFRAN ) 8 MG tablet Take 1 tablet (8 mg total) by mouth every 8 (eight) hours as needed for nausea or vomiting. 01/15/24   Pasam, Chinita, MD  pantoprazole  (PROTONIX ) 40 MG tablet Take 1 tablet (40 mg total) by mouth daily at 6 (six) AM. 02/26/24   Pasam, Avinash, MD  phytonadione  (VITAMIN K) 5 MG tablet Take 2 tablets (10 mg total) by mouth daily. Patient taking differently: Take 10 mg by mouth daily. Pt reports all done 12/25/23   Pasam, Chinita, MD  prochlorperazine  (COMPAZINE ) 10 MG tablet Take 1 tablet (10 mg total) by mouth every 6 (six) hours as needed for nausea or vomiting. 02/11/24   Hanford Powell BRAVO, NP  tenofovir  alafenamide (VEMLIDY ) 25 MG tablet Take 1 tablet (25 mg total) by mouth daily. 09/22/23   Fleeta Kathie Jomarie LOISE, MD  traMADol  (ULTRAM ) 50 MG tablet Take 1 tablet (50 mg total) by mouth every 6 (six) hours as needed for severe pain (pain score  7-10) or moderate pain (pain score 4-6). 02/05/24   Boscia, Heather E, NP    Allergies: Patient has no known allergies.    Review of Systems  Neurological:  Positive for weakness.  All other systems reviewed and are negative.   Updated Vital Signs BP (!) 108/58   Pulse 95   Temp 98.1 F (36.7 C)   Resp 18   SpO2 97%   Physical Exam Vitals and nursing note reviewed.  Constitutional:      General: She is not in acute distress.    Appearance: Normal appearance.     Comments: Thin, chronically ill appearing, non toxic, non septic appearing  HENT:     Head: Normocephalic and atraumatic.  Eyes:     General:        Right eye: No discharge.        Left eye: No discharge.  Cardiovascular:     Rate and Rhythm: Normal rate and regular rhythm.     Heart sounds: No murmur heard.    No friction rub. No gallop.  Pulmonary:     Effort: Pulmonary effort is normal.     Breath sounds: Normal breath sounds.     Comments: No wheezing, rhonchi, stridor, rales Abdominal:     General: Bowel sounds are normal.     Palpations: Abdomen is soft.  Musculoskeletal:     Comments: Intact strength 5/5 bilateral upper and lower extremities  Skin:  General: Skin is warm and dry.     Capillary Refill: Capillary refill takes less than 2 seconds.  Neurological:     Mental Status: She is alert and oriented to person, place, and time.     Comments: Moves all 4 limbs spontaneously, CN II through XII grossly intact, can ambulate without difficulty, intact sensation throughout.  Psychiatric:        Mood and Affect: Mood normal.        Behavior: Behavior normal.     (all labs ordered are listed, but only abnormal results are displayed) Labs Reviewed  COMPREHENSIVE METABOLIC PANEL WITH GFR - Abnormal; Notable for the following components:      Result Value   Glucose, Bld 112 (*)    AST 269 (*)    ALT 147 (*)    All other components within normal limits  CBC - Abnormal; Notable for the following  components:   RBC 2.85 (*)    Hemoglobin 7.6 (*)    HCT 23.6 (*)    RDW 24.1 (*)    All other components within normal limits  CBC WITH DIFFERENTIAL/PLATELET - Abnormal; Notable for the following components:   RBC 2.89 (*)    Hemoglobin 7.6 (*)    HCT 23.7 (*)    RDW 23.9 (*)    All other components within normal limits  CBG MONITORING, ED - Abnormal; Notable for the following components:   Glucose-Capillary 103 (*)    All other components within normal limits  HCG, SERUM, QUALITATIVE  URINALYSIS, ROUTINE W REFLEX MICROSCOPIC    EKG: None  Radiology: No results found.   Procedures   Medications Ordered in the ED  sodium chloride  0.9 % bolus 1,000 mL (1,000 mLs Intravenous New Bag/Given 03/10/24 2109)  ondansetron  (ZOFRAN ) injection 4 mg (4 mg Intravenous Given 03/10/24 2108)  morphine  (PF) 4 MG/ML injection 4 mg (4 mg Intravenous Given 03/10/24 2109)                                    Medical Decision Making Amount and/or Complexity of Data Reviewed Labs: ordered.  Risk Prescription drug management.   This patient is a 48 y.o. female  who presents to the ED for concern of generalized pain, weakness, nausea, headache.   Differential diagnoses prior to evaluation: The emergent differential diagnosis includes, but is not limited to,  CVA, spinal cord injury, ACS, arrhythmia, syncope, orthostatic hypotension, sepsis, hypoglycemia, hypoxia, electrolyte disturbance, endocrine disorder, anemia, environmental exposure, polypharmacy . This is not an exhaustive differential.   Past Medical History / Co-morbidities / Social History: hepatocellular carcinoma currently undergoing chemotherapy, portal venous hypertension  Additional history: Chart reviewed. Pertinent results include: Reviewed lab work, imaging from recent emergency department visits, outpatient oncology visit  Physical Exam: Physical exam performed. The pertinent findings include: Chronically ill-appearing, but  nontoxic, nonseptic appearing.  Afebrile, vital signs stable.  Lab Tests/Imaging studies: I personally interpreted labs/imaging and the pertinent results include: Chronic anemia, hemoglobin 7.6, not significantly changed from baseline, CMP with elevated AST, ALT, again not significantly changed from baseline.  Negative serum pregnancy test, normal CBG. I agree with the radiologist interpretation.  Medications: I ordered medication including fluids, zofran , morphine  for pain, dehydration, nausea.  I have reviewed the patients home medicines and have made adjustments as needed.   Disposition: After consideration of the diagnostic results and the patients response to treatment, I feel that patient  was stable labs, tolerating p.o., stable for follow-up with PCP/oncologist.  Given her repeated episodes of ED visits for pain and dehydration, I do think that she would benefit from possible infusion clinic for other long-term management.   emergency department workup does not suggest an emergent condition requiring admission or immediate intervention beyond what has been performed at this time. The plan is: as above. The patient is safe for discharge and has been instructed to return immediately for worsening symptoms, change in symptoms or any other concerns.   Final diagnoses:  Weakness  Dehydration  Nausea    ED Discharge Orders     None          Rosan Sherlean VEAR DEVONNA 03/10/24 2210    Dasie Faden, MD 03/11/24 2249

## 2024-03-10 NOTE — ED Notes (Signed)
 Pt unable to provide urine sample at this time

## 2024-03-10 NOTE — Discharge Instructions (Addendum)
 Please follow-up closely with your oncologist, and ask whether or not they can get you set up at an infusion clinic if they think that you are having problems with chronic dehydration.  Use your home nausea medication as needed, try to drink plenty of electrolyte containing fluids such as Pedialyte, Gatorade, return if you have significant worsening pain, nausea, vomiting despite treatment.  Por favor, mantenga un seguimiento cercano con su onclogo y pregunte si puede asignarle a una clnica de infusin si cree que tiene problemas de deshidratacin crnica. Use su medicamento casero para las nuseas segn sea necesario, trate de beber abundantes lquidos con electrolitos, como Pedialyte o Gatorade. Regrese si el dolor, las nuseas o los vmitos empeoran significativamente a pesar del [place].

## 2024-03-11 ENCOUNTER — Other Ambulatory Visit: Payer: Self-pay | Admitting: Oncology

## 2024-03-11 DIAGNOSIS — C220 Liver cell carcinoma: Secondary | ICD-10-CM

## 2024-03-13 ENCOUNTER — Encounter (HOSPITAL_COMMUNITY): Payer: Self-pay | Admitting: Emergency Medicine

## 2024-03-13 ENCOUNTER — Emergency Department (HOSPITAL_COMMUNITY)
Admission: EM | Admit: 2024-03-13 | Discharge: 2024-03-14 | Disposition: A | Discharge status: Home / Self Care | Payer: Self-pay | Attending: Emergency Medicine | Admitting: Emergency Medicine

## 2024-03-13 ENCOUNTER — Other Ambulatory Visit: Payer: Self-pay

## 2024-03-13 DIAGNOSIS — R5383 Other fatigue: Secondary | ICD-10-CM | POA: Insufficient documentation

## 2024-03-13 DIAGNOSIS — R748 Abnormal levels of other serum enzymes: Secondary | ICD-10-CM | POA: Insufficient documentation

## 2024-03-13 DIAGNOSIS — G893 Neoplasm related pain (acute) (chronic): Secondary | ICD-10-CM | POA: Insufficient documentation

## 2024-03-13 DIAGNOSIS — M549 Dorsalgia, unspecified: Secondary | ICD-10-CM | POA: Insufficient documentation

## 2024-03-13 DIAGNOSIS — C229 Malignant neoplasm of liver, not specified as primary or secondary: Secondary | ICD-10-CM | POA: Insufficient documentation

## 2024-03-13 DIAGNOSIS — C169 Malignant neoplasm of stomach, unspecified: Secondary | ICD-10-CM | POA: Insufficient documentation

## 2024-03-13 NOTE — ED Triage Notes (Signed)
 Pt reports generalized body aches & back pain. Pain 10/10. Has liver & stomach cancer. Last chemo tx 2 weeks ago.  Was seen for same recently.

## 2024-03-13 NOTE — ED Notes (Signed)
 Urine sent to lab

## 2024-03-14 ENCOUNTER — Encounter: Payer: Self-pay | Admitting: Oncology

## 2024-03-14 ENCOUNTER — Other Ambulatory Visit: Payer: Self-pay

## 2024-03-14 ENCOUNTER — Other Ambulatory Visit: Payer: Self-pay | Admitting: Oncology

## 2024-03-14 ENCOUNTER — Encounter (HOSPITAL_COMMUNITY): Payer: Self-pay | Admitting: Oncology

## 2024-03-14 ENCOUNTER — Other Ambulatory Visit (HOSPITAL_COMMUNITY): Payer: Self-pay

## 2024-03-14 DIAGNOSIS — G893 Neoplasm related pain (acute) (chronic): Secondary | ICD-10-CM

## 2024-03-14 DIAGNOSIS — E860 Dehydration: Secondary | ICD-10-CM | POA: Insufficient documentation

## 2024-03-14 LAB — CBC WITH DIFFERENTIAL/PLATELET
Basophils Absolute: 0.2 K/uL — ABNORMAL HIGH (ref 0.0–0.1)
Basophils Relative: 2 %
Eosinophils Absolute: 0.6 K/uL — ABNORMAL HIGH (ref 0.0–0.5)
Eosinophils Relative: 8 %
HCT: 21.7 % — ABNORMAL LOW (ref 36.0–46.0)
Hemoglobin: 7.2 g/dL — ABNORMAL LOW (ref 12.0–15.0)
Lymphocytes Relative: 28 %
Lymphs Abs: 2.2 K/uL (ref 0.7–4.0)
MCH: 27.4 pg (ref 26.0–34.0)
MCHC: 33.2 g/dL (ref 30.0–36.0)
MCV: 82.5 fL (ref 80.0–100.0)
Monocytes Absolute: 0.9 K/uL (ref 0.1–1.0)
Monocytes Relative: 11 %
Neutro Abs: 4 K/uL (ref 1.7–7.7)
Neutrophils Relative %: 51 %
Platelets: 253 K/uL (ref 150–400)
RBC: 2.63 MIL/uL — ABNORMAL LOW (ref 3.87–5.11)
RDW: 25.2 % — ABNORMAL HIGH (ref 11.5–15.5)
WBC: 7.9 K/uL (ref 4.0–10.5)
nRBC: 0 % (ref 0.0–0.2)

## 2024-03-14 LAB — URINALYSIS, W/ REFLEX TO CULTURE (INFECTION SUSPECTED)
Bacteria, UA: NONE SEEN
Bilirubin Urine: NEGATIVE
Glucose, UA: NEGATIVE mg/dL
Hgb urine dipstick: NEGATIVE
Ketones, ur: NEGATIVE mg/dL
Nitrite: NEGATIVE
Protein, ur: NEGATIVE mg/dL
Specific Gravity, Urine: 1.02 (ref 1.005–1.030)
pH: 7 (ref 5.0–8.0)

## 2024-03-14 LAB — MAGNESIUM: Magnesium: 2 mg/dL (ref 1.7–2.4)

## 2024-03-14 LAB — COMPREHENSIVE METABOLIC PANEL WITH GFR
ALT: 136 U/L — ABNORMAL HIGH (ref 0–44)
AST: 304 U/L — ABNORMAL HIGH (ref 15–41)
Albumin: 3.7 g/dL (ref 3.5–5.0)
Alkaline Phosphatase: 102 U/L (ref 38–126)
Anion gap: 14 (ref 5–15)
BUN: 10 mg/dL (ref 6–20)
CO2: 21 mmol/L — ABNORMAL LOW (ref 22–32)
Calcium: 8.9 mg/dL (ref 8.9–10.3)
Chloride: 101 mmol/L (ref 98–111)
Creatinine, Ser: 0.67 mg/dL (ref 0.44–1.00)
GFR, Estimated: >60 mL/min (ref >60)
Glucose, Bld: 106 mg/dL — ABNORMAL HIGH (ref 70–99)
Potassium: 5 mmol/L (ref 3.5–5.1)
Sodium: 135 mmol/L (ref 135–145)
Total Bilirubin: 0.8 mg/dL (ref 0.0–1.2)
Total Protein: 7.9 g/dL (ref 6.5–8.1)

## 2024-03-14 LAB — CK: Total CK: 53 U/L (ref 38–234)

## 2024-03-14 LAB — LIPASE, BLOOD: Lipase: 64 U/L — ABNORMAL HIGH (ref 11–51)

## 2024-03-14 LAB — LACTIC ACID, PLASMA: Lactic Acid, Venous: 1.7 mmol/L (ref 0.5–1.9)

## 2024-03-14 MED ORDER — MORPHINE SULFATE 20 MG/5ML PO SOLN: ORAL | 0 refills | Status: DC

## 2024-03-14 MED ORDER — HYDROMORPHONE HCL 1 MG/ML IJ SOLN
0.5000 mg | INTRAMUSCULAR | Status: DC | PRN
  Administered 2024-03-14: 0.5 mg via INTRAVENOUS
  Filled 2024-03-14: qty 1

## 2024-03-14 MED ORDER — MORPHINE SULFATE (CONCENTRATE) 10 MG /0.5 ML PO SOLN
10.0000 mg | Freq: Once | ORAL | Status: AC
  Administered 2024-03-14: 10 mg via ORAL
  Filled 2024-03-14: qty 0.5

## 2024-03-14 MED ORDER — TRAMADOL HCL 50 MG PO TABS
50.0000 mg | ORAL_TABLET | Freq: Four times a day (QID) | ORAL | 0 refills | Status: DC | PRN
  Filled 2024-03-14: qty 90, 23d supply, fill #0

## 2024-03-14 MED ORDER — SODIUM CHLORIDE 0.9 % IV BOLUS
1000.0000 mL | Freq: Once | INTRAVENOUS | Status: AC
  Administered 2024-03-14: 1000 mL via INTRAVENOUS

## 2024-03-14 MED ORDER — MORPHINE SULFATE 20 MG/5ML PO SOLN
ORAL | 0 refills | Status: DC
  Filled 2024-03-14: qty 400, 14d supply, fill #0
  Filled 2024-03-14: qty 100, 3d supply, fill #0

## 2024-03-14 NOTE — Progress Notes (Signed)
 The patient is aware per Mliss, the interpreter, and agrees to attend both for IVF Infusions at [facility] on Tuesday, Sept 9th and 11th at 0800. She was provided the address, correct entrance and the phone number for these appointments.

## 2024-03-14 NOTE — ED Provider Notes (Signed)
 [place] EMERGENCY DEPARTMENT AT [facility] Provider Note  CSN: [id number] Arrival date & time: 03/13/24 2327  Chief Complaint(s) Pain  HPI Christina Gutierrez is a 48 y.o. female with a past medical history listed below including HCC currently undergoing chemo started new chemotherapy 2-1/2 weeks ago here for recurrent and worsening back pain.  This been ongoing since she started her new chemotherapy.  She has been prescribed tramadol  which does not provide any relief.  She denies any fevers or chills.  No coughing or congestion.  No abdominal pain.  No chest pain or shortness of breath.  She does report decreased appetite and nausea without emesis.  Patient also reports generalized fatigue.  The history is provided by the patient.    Past Medical History Past Medical History:  Diagnosis Date   Cancer [facility])    Metabolic acidosis 08/21/2023   Patient Active Problem List   Diagnosis Date Noted   Gingival bleeding 11/13/2023   Port-A-Cath in place 10/23/2023   Situational insomnia 10/02/2023   Cancer associated pain 08/28/2023   Portal venous hypertension (HCC) 08/23/2023   Intractable pain 08/23/2023   Hepatocellular carcinoma (HCC) 08/22/2023   Hepatic failure (HCC) 08/21/2023   Normocytic anemia 08/21/2023   Transaminitis 08/21/2023   Disseminated varicella 08/21/2023   Hyperammonemia (HCC) 08/21/2023   Chronic viral hepatitis B without delta-agent (HCC) 08/21/2023   Home Medication(s) Prior to Admission medications   Medication Sig Start Date End Date Taking? Authorizing Provider  morphine  20 MG/5ML solution Dose 5.0-20 mg = 1.25 - 5 mL every 4-6 hours as needed pain depending on severity of pain 03/14/24  Yes Macalister Arnaud, Raynell Moder, MD  ALPRAZolam  (XANAX ) 0.5 MG tablet Take 1 tablet (0.5 mg total) by mouth at bedtime as needed for anxiety. 02/05/24   Boscia, Heather E, NP  lidocaine -prilocaine  (EMLA ) cream Apply 1 Application topically as needed. 02/26/24    Pasam, Chinita, MD  ondansetron  (ZOFRAN ) 8 MG tablet Take 1 tablet (8 mg total) by mouth every 8 (eight) hours as needed for nausea or vomiting. 01/15/24   Pasam, Chinita, MD  pantoprazole  (PROTONIX ) 40 MG tablet Take 1 tablet (40 mg total) by mouth daily at 6 (six) AM. 02/26/24   Pasam, Avinash, MD  phytonadione  (VITAMIN K) 5 MG tablet Take 2 tablets (10 mg total) by mouth daily. Patient taking differently: Take 10 mg by mouth daily. Pt reports all done 12/25/23   Pasam, Chinita, MD  prochlorperazine  (COMPAZINE ) 10 MG tablet Take 1 tablet (10 mg total) by mouth every 6 (six) hours as needed for nausea or vomiting. 02/11/24   Hanford Powell BRAVO, NP  tenofovir  alafenamide (VEMLIDY ) 25 MG tablet Take 1 tablet (25 mg total) by mouth daily. 09/22/23   Fleeta Kathie Jomarie LOISE, MD  Allergies Patient has no known allergies.  Review of Systems Review of Systems As noted in HPI  Physical Exam Vital Signs  I have reviewed the triage vital signs BP 110/68   Pulse 88   Temp 98.6 F (37 C) (Oral)   Resp 18   SpO2 99%   Physical Exam Vitals reviewed.  Constitutional:      General: She is not in acute distress.    Appearance: She is well-developed. She is not diaphoretic.  HENT:     Head: Normocephalic and atraumatic.     Right Ear: External ear normal.     Left Ear: External ear normal.     Nose: Nose normal.  Eyes:     General: No scleral icterus.    Conjunctiva/sclera: Conjunctivae normal.  Neck:     Trachea: Phonation normal.  Cardiovascular:     Rate and Rhythm: Normal rate and regular rhythm.  Pulmonary:     Effort: Pulmonary effort is normal. No respiratory distress.     Breath sounds: No stridor.  Abdominal:     General: There is no distension.     Tenderness: There is no abdominal tenderness. There is no guarding or rebound.  Musculoskeletal:         General: Normal range of motion.     Cervical back: Normal range of motion.  Neurological:     Mental Status: She is alert and oriented to person, place, and time.  Psychiatric:        Behavior: Behavior normal.     ED Results and Treatments Labs (all labs ordered are listed, but only abnormal results are displayed) Labs Reviewed  CBC WITH DIFFERENTIAL/PLATELET - Abnormal; Notable for the following components:      Result Value   RBC 2.63 (*)    Hemoglobin 7.2 (*)    HCT 21.7 (*)    RDW 25.2 (*)    Eosinophils Absolute 0.6 (*)    Basophils Absolute 0.2 (*)    All other components within normal limits  COMPREHENSIVE METABOLIC PANEL WITH GFR - Abnormal; Notable for the following components:   CO2 21 (*)    Glucose, Bld 106 (*)    AST 304 (*)    ALT 136 (*)    All other components within normal limits  LIPASE, BLOOD - Abnormal; Notable for the following components:   Lipase 64 (*)    All other components within normal limits  URINALYSIS, W/ REFLEX TO CULTURE (INFECTION SUSPECTED) - Abnormal; Notable for the following components:   Leukocytes,Ua TRACE (*)    All other components within normal limits  CK  MAGNESIUM   LACTIC ACID, PLASMA                                                                                                                         EKG  EKG Interpretation Date/Time:    Ventricular Rate:    PR Interval:    QRS Duration:    QT Interval:  QTC Calculation:   R Axis:      Text Interpretation:         Radiology No results found.  Medications Ordered in ED Medications  HYDROmorphone  (DILAUDID ) injection 0.5 mg (0.5 mg Intravenous Given 03/14/24 0035)  sodium chloride  0.9 % bolus 1,000 mL (0 mLs Intravenous Stopped 03/14/24 0227)  morphine  CONCENTRATE 10 mg / 0.5 ml oral solution 10 mg (10 mg Oral Given 03/14/24 0227)   Procedures Procedures  (including critical care time) Medical Decision Making / ED Course   Medical Decision Making Amount  and/or Complexity of Data Reviewed Labs: ordered. Decision-making details documented in ED Course.  Risk Prescription drug management. Parenteral controlled substances. Decision regarding hospitalization.    Back pain, generalized fatigue in the setting of HCC and chemotherapy.  Differential diagnosis considered  CBC without leukocytosis.  Hemoglobin relatively stable.  No electrolyte derangements or renal sufficiency.  Relatively stable transaminitis without bili obstruction.  Mildly elevated lipase but not concerning for pancreatitis.  UA without evidence of infection.  CK normal.  Lactic acid normal.  On prior CT scans, patient did not have any osseous metastatic disease  Patient is not tachycardic, hypoxic and not complaining of any shortness of breath that would be concerning for PE, pneumonia, pneumothorax.  Favoring pain related to cancer/chemotherapy.  Provided with IV fluids for hydration as well as pain medicine which provided complete resolution of her pain.  She is tolerating p.o. She felt well enough to go home. Will change pain medicine regimen.     Final Clinical Impression(s) / ED Diagnoses Final diagnoses:  Cancer-related breakthrough pain   The patient appears reasonably screened and/or stabilized for discharge and I doubt any other medical condition or other [facility] requiring further screening, evaluation, or treatment in the ED at this time. I have discussed the findings, Dx and Tx plan with the patient/family who expressed understanding and agree(s) with the plan. Discharge instructions discussed at length. The patient/family was given strict return precautions who verbalized understanding of the instructions. No further questions at time of discharge.  Disposition: Discharge  Condition: Good  ED Discharge Orders          Ordered    morphine  20 MG/5ML solution        03/14/24 0251            [place]  narcotic database reviewed and no active  prescriptions noted.   Follow Up: [name], MD [address] [place] [address] [phone]  Call  to schedule an appointment for close follow up    This chart was dictated using voice recognition software.  Despite best efforts to proofread,  errors can occur which can change the documentation meaning.    Trine Raynell Moder, MD 03/14/24 [phone]

## 2024-03-15 ENCOUNTER — Telehealth: Payer: Self-pay | Admitting: Oncology

## 2024-03-15 ENCOUNTER — Inpatient Hospital Stay (HOSPITAL_COMMUNITY): Admission: RE | Admit: 2024-03-15 | Payer: Self-pay | Source: Ambulatory Visit

## 2024-03-15 ENCOUNTER — Telehealth: Payer: Self-pay

## 2024-03-15 ENCOUNTER — Inpatient Hospital Stay: Payer: Self-pay

## 2024-03-15 ENCOUNTER — Other Ambulatory Visit: Payer: Self-pay

## 2024-03-15 NOTE — Telephone Encounter (Signed)
 CALLED PT TO LET HER KNOW ABOUT APPT., VOICEMAIL NOT SET UP.

## 2024-03-15 NOTE — Telephone Encounter (Signed)
 Patient has communicated to Dr. Clovis Office over the last two days that she feels weak and feels like she needs some IV Fluids. Dr. Pasam ordered 2 separate 1000cc fluid infusions, which were arranged for this morning and Thursday morning at 0800 at [facility] at [address], [address]. Patient was aware of all this per our hospital Spanish Interpretor and reportedly agreed to attend.   This morning at approximately 9am it was brought to my attention from the [facility] that patient did not show up so her appt was cancelled along with the Thursday infusion appt. In addition, it was mentioned by staff in the Secure Chat that patient did not answer her phone. Also, patient does not have her voicemail set up.   Additional attempt was made to arrange an infusion appt here at [facility] for 1330 to receive IVF however, no one was able to reach patient via telephone to inform her.   At approximately 1545, a call was attempted with no answer and then [employer] was requested. Spanish Interpreter called patient's husband after trying to get a hold of patient without success. Patient's husband answered and handed phone to the patient. Patient claims she was at her appointment but was told she did not have an appointment. She was unable to say what location she had attended. She disappeared for 3-4 minutes before the interpretor ended the call. Another call was placed and patient was advised to either go the the emergency room if she felt her symptoms became worse or to call Dr. Clovis Office tomorrow morning at [phone] opt. [phone]. The call ended.

## 2024-03-15 NOTE — Telephone Encounter (Signed)
 Voicemail not set up.

## 2024-03-16 ENCOUNTER — Inpatient Hospital Stay: Payer: Self-pay | Attending: Oncology

## 2024-03-16 ENCOUNTER — Other Ambulatory Visit: Payer: Self-pay | Admitting: Oncology

## 2024-03-16 ENCOUNTER — Other Ambulatory Visit (HOSPITAL_COMMUNITY): Payer: Self-pay

## 2024-03-16 ENCOUNTER — Inpatient Hospital Stay (HOSPITAL_BASED_OUTPATIENT_CLINIC_OR_DEPARTMENT_OTHER): Payer: Self-pay | Admitting: Oncology

## 2024-03-16 VITALS — BP 110/72 | HR 78 | Temp 98.6°F | Resp 16 | Ht 60.0 in | Wt 108.1 lb

## 2024-03-16 DIAGNOSIS — Z79899 Other long term (current) drug therapy: Secondary | ICD-10-CM | POA: Insufficient documentation

## 2024-03-16 DIAGNOSIS — B1910 Unspecified viral hepatitis B without hepatic coma: Secondary | ICD-10-CM | POA: Insufficient documentation

## 2024-03-16 DIAGNOSIS — C220 Liver cell carcinoma: Secondary | ICD-10-CM | POA: Insufficient documentation

## 2024-03-16 DIAGNOSIS — R1010 Upper abdominal pain, unspecified: Secondary | ICD-10-CM

## 2024-03-16 DIAGNOSIS — G893 Neoplasm related pain (acute) (chronic): Secondary | ICD-10-CM

## 2024-03-16 DIAGNOSIS — Z95828 Presence of other vascular implants and grafts: Secondary | ICD-10-CM

## 2024-03-16 DIAGNOSIS — R634 Abnormal weight loss: Secondary | ICD-10-CM | POA: Insufficient documentation

## 2024-03-16 DIAGNOSIS — Z79624 Long term (current) use of inhibitors of nucleotide synthesis: Secondary | ICD-10-CM | POA: Insufficient documentation

## 2024-03-16 MED ORDER — MORPHINE SULFATE (PF) 2 MG/ML IV SOLN
2.0000 mg | Freq: Once | INTRAVENOUS | Status: AC
  Administered 2024-03-16: 2 mg via INTRAVENOUS
  Filled 2024-03-16: qty 1

## 2024-03-16 MED ORDER — SODIUM CHLORIDE 0.9 % IV SOLN: Freq: Once | INTRAVENOUS | Status: AC

## 2024-03-16 NOTE — Patient Instructions (Signed)
Deshidratacin en los adultos Dehydration, Adult La deshidratacin es una afeccin que se caracteriza por una cantidad insuficiente de agua u otros lquidos en el organismo. Esto sucede cuando una persona pierde ms lquido del que consume. Los rganos importantes no pueden funcionar correctamente sin una cantidad [place] de lquidos. Cualquier prdida de lquidos del organismo puede causar deshidratacin. La deshidratacin puede ser leve, grave o muy grave. Debe tratarse de inmediato para evitar que sea muy grave. Cules son las causas? Afecciones que causan prdida de agua en el cuerpo. Entre ellas, se incluyen las siguientes: Materia fecal lquida (diarrea). Vmitos. Sudoracin abundante. Cristy Hilts. Infeccin. Hacer mucho pis (orinar). No beber la cantidad suficiente de lquidos. Determinados medicamentos, como aquellos que eliminan el exceso de lquido del organismo (diurticos). Falta de agua potable segura. No poder obtener suficiente agua y alimentos. Qu incrementa el riesgo? Tener una enfermedad prolongada (crnica) que no se ha tratado de Estate agent, como: Diabetes. Enfermedad cardaca. Enfermedad renal. Ser mayor de 20 aos de edad. Tener una discapacidad. Vivir en un lugar alto con respecto al suelo o al nivel del mar (de gran altitud). El aire menos denso y ms seco causa ms prdida de lquidos. Hacer ejercicios que sobrecargan el cuerpo [employer]. Estar activo cuando se encuentra en lugares calurosos. Cules son los signos o sntomas? Los sntomas de deshidratacin dependen de su gravedad. Deshidratacin leve o grave Sed. Sequedad en los labios o la boca. Sentirse mareado o aturdido. Calambres musculares. Producir muy poca cantidad de [place], o de color oscuro. Orina que puede tener el color del t. Dolor de [place]. Deshidratacin muy grave Cambios en la piel. La piel puede: Estar fra al tacto (pegajosa). Tener manchas o estar plida. No  volver a la normalidad de inmediato despus de pellizcarla y soltarla. Escasa produccin o ausencia de lgrimas, orina o sudor. Respiracin acelerada. Presin arterial baja. Pulso dbil. Pulso que supera los 100 latidos por minuto cuando est sentado y [place]. Otros cambios, por ejemplo: Sentir mucha sed. Ojos que se ven huecos (hundidos). Manos y pies fros. Confusin. Estar muy cansado (aletargado) o tener problemas para despertarse. Bajar de [place]. Prdida de la conciencia. Cmo se trata? El tratamiento de esta afeccin depende de la gravedad de la deshidratacin. El tratamiento debe comenzar de inmediato. No espere hasta que su afeccin sea muy grave. La deshidratacin muy grave es Engineer, maintenance (IT). Tendr que ir al hospital. La deshidratacin leve o grave puede tratarse en la casa. Le pedirn que: Beba ms lquidos. Beba una solucin de rehidratacin oral (SRO). Esta bebida le proporciona la cantidad correcta de lquidos, sales y Optometrist (electrolitos). La deshidratacin muy grave puede tratarse: Con lquidos a travs de [employer]. Corrigiendo los niveles bajos de electrolitos en el cuerpo. Tratando el problema que provoc la deshidratacin. Siga estas instrucciones en su casa: Solucin de rehidratacin oral Si se lo indica el mdico, beba una SRO: Prepare una SRO. Use las instrucciones del envase. Comience por beber pequeas cantidades, aproximadamente  taza (120 ml) cada 5 a 10 minutos. Bonnita Nasuti de a Firefighter a la cantidad indicada por el mdico.  Comida y bebida  Beba suficiente lquido transparente como para mantener la orina de color amarillo plido. Si le indicaron que tome una SRO, termine primero la solucin. Luego, comience a beber lentamente otros lquidos transparentes. Beba lquidos, por ejemplo: Agua. No beba solamente agua. Esto puede [employer] niveles de sal (sodio) en el organismo bajen demasiado. Agua de trocitos de [employer]  usted  succiona. Jugo de frutas con agregado de agua (diluido). Bebidas deportivas de bajas caloras. Coma alimentos que contengan cantidades [address] de sales y [place], como bananas, [place], papas, tomates o espinaca. No beba alcohol. Evite las bebidas que contengan cafena o azcar. Estas incluyen las siguientes: Bebidas deportivas ricas en caloras. Jugo de frutas sin agregado de agua. Gaseosas. Caf o bebidas energticas. Evite los alimentos grasos o que contienen mucha grasa o International aid/development worker. Instrucciones generales [employer] de venta libre y los recetados solamente como se lo haya indicado el mdico. No tome comprimidos de [place]. Esto puede hacer que los niveles de sal en el cuerpo suban demasiado. Retome sus actividades normales segn lo indicado por el mdico. Pregntele al mdico qu actividades son seguras para usted. Concurra a todas las visitas de seguimiento. Es posible que el mdico controle y [place]. Comunquese con un mdico si: Tiene dolor de vientre (abdomen) y el dolor: Empeora. Permanece en un solo Environmental consultant. Tiene una erupcin cutnea. Presenta rigidez en el cuello. Se siente enojado o molesto con ms facilidad de lo normal. Est ms cansado o le cuesta despertarse ms de lo normal. Se siente dbil o mareado. Tiene mucha sed. Solicite ayuda de inmediato si: Tiene sntomas de una deshidratacin muy grave. Vomita cada vez que come o bebe. Los vmitos empeoran, no desaparecen o vomita sangre o una sustancia verde. Recibe tratamiento, pero los sntomas empeoran. Tiene fiebre. Tiene un dolor de cabeza muy intenso. Tiene lo siguiente: Diarrea que empeora o que no desaparece. Sangre en las heces (materia fecal). Esto puede hacer que la materia fecal sea negra y tenga aspecto alquitranado. No orina en un lapso de 6 a 8 horas. Solo produce una cantidad pequea de orina en el trmino de 6 a 8 horas, y la orina es [place]. Tiene dificultad para  respirar. Estos sntomas pueden Customer service manager. Solicite ayuda de inmediato. Llame al 911. No espere a ver si los sntomas desaparecen. No conduzca por sus propios medios [employer]. Esta informacin no tiene Theme park manager el consejo del mdico. Asegrese de hacerle al mdico cualquier pregunta que tenga. Document Revised: 02/24/2022 Document Reviewed: 02/24/2022 Elsevier Patient Education  2024 [employer].

## 2024-03-16 NOTE — Progress Notes (Signed)
 [facility] CSW Progress Note  Patient's nurse requested guidance on appropriateness for a report to Social Services (APS). Patient's nurse requested that I review her note from 9/09. CSW does not have a relationship with patient, guidance is strictly based on documented medical information. CSW informed patient's nurse, the general rule is to report if there are concerns, APS will screen it out. Note listed does not indicate a required report to APS (adult is not noted to be apart of a vulnerable populations experiencing abuse or neglect). If there is concern about safety, a wellness check can be made to [employer].    Lizbeth Sprague, LCSW Clinical Social Worker [facility]

## 2024-03-16 NOTE — Progress Notes (Unsigned)
 [facility]  ONCOLOGY CLINIC PROGRESS NOTE   Patient Care Team: [name], MD as PCP - General (Oncology)  PATIENT NAME: Christina Gutierrez   MR#: [id number] DOB: [birth year]  Date of visit: 03/16/2024   ASSESSMENT & PLAN:   Christina Gutierrez is a 48 y.o. Hispanic lady with no significant past medical history was hospitalized on 08/21/2023 after she presented with worsening right-sided abdominal pain, nausea, vomiting, decreased oral intake.  Workup including MRI of the abdomen showed evidence of multifocal hepatocellular carcinoma.  She was diagnosed with hepatitis B infection during this hospital admission.   No problem-specific Assessment & Plan notes found for this encounter.   I reviewed lab results and outside records for this visit and discussed relevant results with the patient. Diagnosis, plan of care and treatment options were also discussed in detail with the patient. Opportunity provided to ask questions and answers provided to her apparent satisfaction. Provided instructions to call our clinic with any problems, questions or concerns prior to return visit. I recommended to continue follow-up with PCP and sub-specialists. She verbalized understanding and agreed with the plan.   NCCN guidelines have been consulted in the planning of this patient's care.  I spent a total of 30 minutes during this encounter with the patient including review of chart and various tests results, discussions about plan of care and coordination of care plan.  Millman Autumn, MD  03/16/2024 4:29 PM  [facility] - A DEPT OF [name] 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  Current Treatment: Systemic treatment with atezolizumab  plus bevacizumab   starting from 09/11/2023.  INTERVAL HISTORY:    Discussed the use of AI scribe software for clinical note transcription with the patient, who gave verbal consent to proceed.  History of Present Illness  Christina Gutierrez is a 48 year old female who presents for follow-up.   She was recently hospitalized due to dizziness and generalized body aches, which she attributes to a possible nutritional deficiency. During this time, she experienced significant difficulty walking due to body pain. She now feels better and denies current body aches.  She continues to experience gum bleeding, although it has decreased in frequency and severity, now occurring primarily at night.  She occasionally experiences nausea, but it is infrequent and not persistent.  She has run out of her numbing medication for her port.  No current body aches and reports occasional nausea without vomiting.    I have reviewed the past medical history, past surgical history, social history and family history with the patient and they are unchanged from previous note.  HISTORY OF PRESENT ILLNESS:   ONCOLOGY HISTORY:   48 y.o. Hispanic lady with no significant past medical history, presented to the emergency department on 08/21/2023 with complaints of right-sided abdominal pain, nausea, vomiting and decreased oral intake for at least 10 days prior to arrival.   On arrival to ED, she was found to have anemia with hemoglobin of 8, white count normal at 6100, platelet count 316,000.  CMP showed elevated AST of 80, ALT increased at 79, bilirubin elevated at 1.8.  Elevated ammonia level of 62.   Right upper quadrant ultrasound showed large heterogenoussolid mass within the liver measuring up to 16.9 cm, suspicious for malignancy. Recommend further evaluation with  contrast-enhanced CT or MRI.   CT abdomen pelvis showed: 15.5 x 12.5 cm solid well-circumscribed mass within the right hepatic lobe. Areas of central low-density  suggest possibility of central scar. There may be a large feeding vessel. Favor focal nodular hyperplasia although fibrolamellar HCC can have a similar appearance. Recommend further evaluation MRI without and with contrast.   Left spontaneous splenorenal shunt suggest portal venous hypertension. No evidence for cirrhosis. This may be related to mass effect and compression of the portal vein from the large mass.   She was recently diagnosed with chickenpox about 20 days prior to this hospitalization.  Still had some itching from vesicles.   She was admitted for further evaluation and management.   MRI of the abdomen with liver protocol on 08/21/2023 showed numerous hepatic lesions are noted, with a dominant lesion which replaces much of the right lobe of the liver estimated to measure approximately 16.5 x 12.7 x 17.7 cm. This lesion is heterogeneous in signal intensity on T1 and T2 weighted images, but clearly has internal areas of hypervascular enhancement on early phase post gadolinium imaging, with persistent low-level enhancement on more delayed imaging. The other dominant lesion is centered in the superior aspect of the liver, likely within the superior aspect of segment 4A estimated to measure approximately 8.1 x 6.8 x 7.8 cm, with similar imaging characteristics to the previously described lesion, although with greater degree of internal washout and probable pseudo capsule on delayed imaging. Multiple other smaller hypervascular lesions are apparent on arterial phase imaging throughout all aspects of the liver, some of which demonstrate delayed washout.   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   On 08/21/2023, AFP was significantly elevated at 32,160 ng/mL.     She was also diagnosed with hepatitis B infection on additional workup.   CT chest on 08/23/2023 showed no evidence of intrathoracic metastatic disease.   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  Her case was  discussed in GI tumor conference on 09/09/2023.  Reviewed images with the team and confirmed multifocal hepatocellular carcinoma.   Plan made for systemic treatments with atezolizumab  plus bevacizumab .  Started from 09/11/2023.  Restaging MRI of the liver on 12/14/2023 showed stable disease without evidence of disease progression or new disease.  Plan to continue current management.  However given progressive gum bleeding, Avastin  was held from 01/15/2024.  Oncology History  Hepatocellular carcinoma (HCC)  08/22/2023 Initial Diagnosis   Hepatocellular carcinoma (HCC)   08/27/2023 Cancer Staging   Staging form: Liver, AJCC 8th Edition - Clinical: Stage IIIA (cT3, cN0, cM0) - Signed by [name], MD on 08/27/2023   09/11/2023 -  Chemotherapy   Patient is on Treatment Plan : HEPATOCELLULAR Atezolizumab  + Bevacizumab  q21d         REVIEW OF SYSTEMS:   Review of Systems - Oncology  All other pertinent systems were reviewed with the patient and are negative.  ALLERGIES: She has no known allergies.  MEDICATIONS:  Current Outpatient Medications  Medication Sig Dispense Refill   ALPRAZolam  (XANAX ) 0.5 MG tablet Take 1 tablet (0.5 mg total) by mouth at bedtime as needed for anxiety. 30 tablet 0   lidocaine -prilocaine  (EMLA ) cream Apply 1 Application topically as needed. 30 g 3   morphine  20 MG/5ML solution Take 1.25 - 5 mL by mouth every 4-6 hours as needed for pain depending on severity of pain 500 mL 0   ondansetron  (ZOFRAN ) 8 MG tablet Take 1 tablet (8 mg total) by mouth  every 8 (eight) hours as needed for nausea or vomiting. 60 tablet 2   pantoprazole  (PROTONIX ) 40 MG tablet Take 1 tablet (40 mg total) by mouth daily at 6 (six) AM. 30 tablet 1   phytonadione  (VITAMIN K) 5 MG tablet Take 2 tablets (10 mg total) by mouth daily. (Patient taking differently: Take 10 mg by mouth daily. Pt reports all done) 6 tablet 0   prochlorperazine  (COMPAZINE ) 10 MG tablet Take 1 tablet (10 mg total) by mouth  every 6 (six) hours as needed for nausea or vomiting. 30 tablet 1   tenofovir  alafenamide (VEMLIDY ) 25 MG tablet Take 1 tablet (25 mg total) by mouth daily. 30 tablet 11   No current facility-administered medications for this visit.     VITALS:   There were no vitals taken for this visit.  Wt Readings from Last 3 Encounters:  03/16/24 108 lb 2 oz (49 kg)  02/26/24 108 lb (49 kg)  02/11/24 111 lb 12.8 oz (50.7 kg)    There is no height or weight on file to calculate BMI.        PHYSICAL EXAM:   Physical Exam Constitutional:      General: She is not in acute distress.    Appearance: Normal appearance.  HENT:     Head: Normocephalic and atraumatic.  Eyes:     General: No scleral icterus.    Conjunctiva/sclera: Conjunctivae normal.  Cardiovascular:     Rate and Rhythm: Normal rate and regular rhythm.     Heart sounds: Normal heart sounds.  Pulmonary:     Effort: Pulmonary effort is normal.     Breath sounds: Normal breath sounds.  Abdominal:     Palpations: There is mass (stable hepatomegaly).  Musculoskeletal:     Right lower leg: No edema.     Left lower leg: No edema.  Neurological:     General: No focal deficit present.     Mental Status: She is alert.  Psychiatric:        Mood and Affect: Mood normal.        Behavior: Behavior normal.      LABORATORY DATA:   I have reviewed the data as listed.  No results found for any visits on 03/16/24.    RADIOGRAPHIC STUDIES:  Reviewed MRI from 12/14/2023 which showed stable disease.  CODE STATUS:  Code Status History     Date Active Date Inactive Code Status Order ID Comments User Context   08/29/2023 0409 08/31/2023 1925 Full Code [id number]  Shona Terry SAILOR, DO ED   08/21/2023 0400 08/23/2023 2127 Full Code [id number]  Lee Kingfisher, MD ED    Questions for Most Recent Historical Code Status (Order [id number])     Question Answer   By: Consent: discussion documented in EHR           Future Appointments   Date Time Provider Department Center  03/18/2024  9:15 AM [facility] FLUSH [facility] None  03/18/2024  9:45 AM Murdock Jellison, Chinita, MD [facility] None  03/18/2024 10:30 AM [facility] INFUSION [facility] None  03/18/2024 10:30 AM Ivonne Harlene RAMAN, RD [facility] None     This document was completed utilizing speech recognition software. Grammatical errors, random word insertions, pronoun errors, and incomplete sentences are an occasional consequence of this system due to software limitations, ambient noise, and hardware issues. Any formal questions or concerns about the content, text or information contained within the body of this dictation should be directly addressed to the provider for clarification.

## 2024-03-16 NOTE — Progress Notes (Signed)
 Addendum -   Disregard previous note. Patient's nurse clarified question was in relation to Social Work Services at the [employer]. Patient's nurse has no fear or indicator social services (APS) should be involved.

## 2024-03-17 ENCOUNTER — Encounter: Payer: Self-pay | Admitting: Oncology

## 2024-03-17 ENCOUNTER — Encounter (HOSPITAL_COMMUNITY): Payer: Self-pay

## 2024-03-17 ENCOUNTER — Encounter (HOSPITAL_COMMUNITY): Payer: Self-pay | Admitting: Oncology

## 2024-03-17 ENCOUNTER — Other Ambulatory Visit: Payer: Self-pay

## 2024-03-17 DIAGNOSIS — R109 Unspecified abdominal pain: Secondary | ICD-10-CM | POA: Insufficient documentation

## 2024-03-17 NOTE — Assessment & Plan Note (Signed)
 CT scan from August 31st in the ED indicates partial small bowel obstruction. The tumor is exerting pressure on the inferior vena cava, causing discomfort and pain. She reports persistent abdominal pain, nausea, and occasional vomiting. Bowel movements are present, but she is not passing gas. - Continue morphine  for pain management. - Administer IV fluids for hydration and pain control. -Abdominal pain improved with IV morphine  in clinic today. - Instruct to go to the emergency room if unable to have bowel movements or pass gas.

## 2024-03-17 NOTE — Assessment & Plan Note (Addendum)
 Please review oncology history for additional details and timeline of events.  MRI of the abdomen with liver protocol on 08/21/2023 showed numerous hepatic lesions are noted, with a dominant lesion which replaces much of the right lobe of the liver estimated to measure approximately 16.5 x 12.7 x 17.7 cm. This lesion is heterogeneous in signal intensity on T1 and T2 weighted images, but clearly has internal areas of hypervascular enhancement on early phase post gadolinium imaging, with persistent low-level enhancement on more delayed imaging. The other dominant lesion is centered in the superior aspect of the liver, likely within the superior aspect of segment 4A estimated to measure approximately 8.1 x 6.8 x 7.8 cm, with similar imaging characteristics to the previously described lesion, although with greater degree of internal washout and probable pseudo capsule on delayed imaging. Multiple other smaller hypervascular lesions are apparent on arterial phase imaging throughout all aspects of the liver, some of which demonstrate delayed washout.   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  This was confirmed during GI tumor conference on 09/09/2023.  -Previously undiagnosed hepatitis B could have been the contributing factor. -Not a candidate for liver transplant or surgical resection because of the extent of disease. -Discussed diagnosis, prognosis, plan of care, treatment options.  Reviewed NCCN guidelines.   On 08/21/2023, AFP was significantly elevated at 32,160 ng/mL.     CT chest on 08/23/2023 showed no evidence of intrathoracic metastatic disease.  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  Plan made for systemic treatments with atezolizumab  plus bevacizumab .  Started this from 09/11/2023.  She has been tolerating treatments well without any major side effects.  Restaging MRI of the liver on 12/14/2023 showed stable disease without evidence of disease progression or new disease.  She  had CT abdomen and pelvis on 02/12/2024 when she was hospitalized and it also showed stable disease.  Plan was to continue current management.  However given progressive gum bleeding, we started holding bevacizumab  to see if that would help improve her symptoms.  Last dose of Avastin  was on 12/25/2023.  She has since established with a dentist and has periodontal disease that is causing gum bleeding.  It is slowly improving.  Last dose of atezolizumab  was on 02/26/2024.  After her last dose, patient developed significant fatigue, decreased appetite and inability to eat.  Also has been having progressive abdominal pain which needed multiple ED visits.  CT abdomen and pelvis was obtained on 03/06/2024 which showed signs of partial small bowel obstruction.  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  Though radiologically her disease is stable, clinically she is progressing.  Patient does not want to continue with atezolizumab  as she is afraid of worsening fatigue, failure to thrive.  Will continue supportive care for now.  On return visit, we will discuss alternative treatment options including possibility of lenvatinib versus ramucirumab.  Will also discuss her case in tumor conference to see if she can get palliative radiation versus ablative measures to help with her pain.

## 2024-03-17 NOTE — Progress Notes (Signed)
 Pt is approved for the $1000 [employer].

## 2024-03-18 ENCOUNTER — Telehealth: Payer: Self-pay

## 2024-03-18 ENCOUNTER — Other Ambulatory Visit (HOSPITAL_COMMUNITY): Payer: Self-pay

## 2024-03-18 ENCOUNTER — Encounter: Payer: Self-pay | Admitting: Oncology

## 2024-03-18 ENCOUNTER — Inpatient Hospital Stay: Payer: Self-pay

## 2024-03-18 ENCOUNTER — Other Ambulatory Visit: Payer: Self-pay

## 2024-03-18 ENCOUNTER — Inpatient Hospital Stay: Payer: Self-pay | Admitting: Dietician

## 2024-03-18 ENCOUNTER — Other Ambulatory Visit (HOSPITAL_COMMUNITY): Payer: Self-pay | Admitting: Oncology

## 2024-03-18 ENCOUNTER — Inpatient Hospital Stay: Payer: Self-pay | Admitting: Oncology

## 2024-03-18 VITALS — BP 105/54 | HR 84 | Temp 98.0°F | Resp 16 | Ht 60.0 in | Wt 105.0 lb

## 2024-03-18 DIAGNOSIS — Z95828 Presence of other vascular implants and grafts: Secondary | ICD-10-CM

## 2024-03-18 DIAGNOSIS — C220 Liver cell carcinoma: Secondary | ICD-10-CM

## 2024-03-18 LAB — CBC WITH DIFFERENTIAL (CANCER CENTER ONLY)
Abs Immature Granulocytes: 0.01 K/uL (ref 0.00–0.07)
Basophils Absolute: 0.2 K/uL — ABNORMAL HIGH (ref 0.0–0.1)
Basophils Relative: 3 %
Eosinophils Absolute: 0.7 K/uL — ABNORMAL HIGH (ref 0.0–0.5)
Eosinophils Relative: 11 %
HCT: 23.6 % — ABNORMAL LOW (ref 36.0–46.0)
Hemoglobin: 8 g/dL — ABNORMAL LOW (ref 12.0–15.0)
Immature Granulocytes: 0 %
Lymphocytes Relative: 25 %
Lymphs Abs: 1.6 K/uL (ref 0.7–4.0)
MCH: 27.1 pg (ref 26.0–34.0)
MCHC: 33.9 g/dL (ref 30.0–36.0)
MCV: 80 fL (ref 80.0–100.0)
Monocytes Absolute: 0.6 K/uL (ref 0.1–1.0)
Monocytes Relative: 9 %
Neutro Abs: 3.4 K/uL (ref 1.7–7.7)
Neutrophils Relative %: 52 %
Platelet Count: 420 K/uL — ABNORMAL HIGH (ref 150–400)
RBC: 2.95 MIL/uL — ABNORMAL LOW (ref 3.87–5.11)
RDW: 23.9 % — ABNORMAL HIGH (ref 11.5–15.5)
WBC Count: 6.4 K/uL (ref 4.0–10.5)
nRBC: 0 % (ref 0.0–0.2)

## 2024-03-18 LAB — TOTAL PROTEIN, URINE DIPSTICK: Protein, ur: NEGATIVE mg/dL

## 2024-03-18 LAB — CMP (CANCER CENTER ONLY)
ALT: 110 U/L — ABNORMAL HIGH (ref 0–44)
AST: 235 U/L (ref 15–41)
Albumin: 4 g/dL (ref 3.5–5.0)
Alkaline Phosphatase: 97 U/L (ref 38–126)
Anion gap: 8 (ref 5–15)
BUN: 9 mg/dL (ref 6–20)
CO2: 24 mmol/L (ref 22–32)
Calcium: 9.8 mg/dL (ref 8.9–10.3)
Chloride: 102 mmol/L (ref 98–111)
Creatinine: 0.56 mg/dL (ref 0.44–1.00)
GFR, Estimated: >60 mL/min (ref >60)
Glucose, Bld: 112 mg/dL — ABNORMAL HIGH (ref 70–99)
Potassium: 3.6 mmol/L (ref 3.5–5.1)
Sodium: 134 mmol/L — ABNORMAL LOW (ref 135–145)
Total Bilirubin: 1 mg/dL (ref 0.0–1.2)
Total Protein: 8.8 g/dL — ABNORMAL HIGH (ref 6.5–8.1)

## 2024-03-18 LAB — TSH: TSH: 2.36 u[IU]/mL (ref 0.350–4.500)

## 2024-03-18 LAB — CORTISOL: Cortisol, Plasma: 16.6 ug/dL

## 2024-03-18 MED ORDER — SODIUM CHLORIDE 0.9 % IV SOLN: Freq: Once | INTRAVENOUS | Status: DC

## 2024-03-18 MED ORDER — MORPHINE SULFATE (PF) 2 MG/ML IV SOLN
2.0000 mg | INTRAVENOUS | Status: DC | PRN
  Administered 2024-03-18: 2 mg via INTRAVENOUS
  Filled 2024-03-18: qty 1

## 2024-03-18 MED ORDER — PANTOPRAZOLE SODIUM 40 MG PO TBEC
40.0000 mg | DELAYED_RELEASE_TABLET | Freq: Every day | ORAL | 2 refills | Status: DC
  Filled 2024-03-18: qty 30, 30d supply, fill #0

## 2024-03-18 MED ORDER — ONDANSETRON HCL 4 MG/2ML IJ SOLN
8.0000 mg | Freq: Once | INTRAMUSCULAR | Status: AC
  Administered 2024-03-18: 8 mg via INTRAVENOUS
  Filled 2024-03-18: qty 4

## 2024-03-18 MED ORDER — SODIUM CHLORIDE 0.9 % IV SOLN: Freq: Once | INTRAVENOUS | Status: AC

## 2024-03-18 NOTE — Progress Notes (Signed)
 [facility]  ONCOLOGY CLINIC PROGRESS NOTE   Patient Care Team: [name], MD as PCP - General (Oncology)  PATIENT NAME: Christina Gutierrez   MR#: [id number] DOB: June 21, 1976  Date of visit: 03/18/2024   ASSESSMENT & PLAN:   Christina Gutierrez is a 48 y.o. Hispanic lady with no significant past medical history was hospitalized on 08/21/2023 after she presented with worsening right-sided abdominal pain, nausea, vomiting, decreased oral intake.  Workup including MRI of the abdomen showed evidence of multifocal hepatocellular carcinoma.  She was diagnosed with hepatitis B infection during this hospital admission.   Hepatocellular carcinoma (HCC) Please review oncology history for additional details and timeline of events.  MRI of the abdomen with liver protocol on 08/21/2023 showed numerous hepatic lesions are noted, with a dominant lesion which replaces much of the right lobe of the liver estimated to measure approximately 16.5 x 12.7 x 17.7 cm. This lesion is heterogeneous in signal intensity on T1 and T2 weighted images, but clearly has internal areas of hypervascular enhancement on early phase post gadolinium imaging, with persistent low-level enhancement on more delayed imaging. The other dominant lesion is centered in the superior aspect of the liver, likely within the superior aspect of segment 4A estimated to measure approximately 8.1 x 6.8 x 7.8 cm, with similar imaging characteristics to the previously described lesion, although with greater degree of internal washout and probable pseudo capsule on delayed imaging. Multiple other smaller hypervascular lesions are apparent on arterial phase imaging throughout all aspects of the liver, some of which demonstrate delayed washout.   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  This was confirmed during GI tumor conference on 09/09/2023.  -Previously undiagnosed hepatitis B could have  been the contributing factor. -Not a candidate for liver transplant or surgical resection because of the extent of disease. -Discussed diagnosis, prognosis, plan of care, treatment options.  Reviewed NCCN guidelines.   On 08/21/2023, AFP was significantly elevated at 32,160 ng/mL.     CT chest on 08/23/2023 showed no evidence of intrathoracic metastatic disease.  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  Plan made for systemic treatments with atezolizumab  plus bevacizumab .  Started this from 09/11/2023.  She has been tolerating treatments well without any major side effects.  Restaging MRI of the liver on 12/14/2023 showed stable disease without evidence of disease progression or new disease.  She had CT abdomen and pelvis on 02/12/2024 when she was hospitalized and it also showed stable disease.  Plan was to continue current management.  However given progressive gum bleeding, we started holding bevacizumab  to see if that would help improve her symptoms.  Last dose of Avastin  was on 12/25/2023.  She has since established with a dentist and has periodontal disease that is causing gum bleeding.  It is slowly improving.  Last dose of atezolizumab  was on 02/26/2024.  After her last dose, patient developed significant fatigue, decreased appetite and inability to eat.  Also has been having progressive abdominal pain which needed multiple ED visits.  CT abdomen and pelvis was obtained on 03/06/2024 which showed signs of partial small bowel obstruction.  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  Though radiologically her disease is stable, clinically she is progressing.  Patient does not want to continue with atezolizumab  as she is afraid of worsening fatigue, failure to thrive.  Will continue supportive care for now.  Will also discuss her case in tumor conference to see if she can get  palliative radiation versus ablative measures to help with her pain.   On return visit, we will discuss alternative treatment options including possibility of lenvatinib versus ramucirumab.   Nausea, vomiting, and poor appetite secondary to malignancy She experiences nausea, has vomited twice recently, and reports poor appetite and discomfort with both solid and liquid intake, likely related to the partial bowel obstruction and tumor pressure. - Provide supportive care with IV fluids.  Will arrange for IV fluids at our infusion center on [facility]. on Monday Wednesday Friday for the next few weeks.  Abnormal weight loss secondary to malignancy Reports loss of appetite and difficulty maintaining intake, contributing to weight loss, likely secondary to the malignancy and its associated symptoms.  I reviewed lab results and outside records for this visit and discussed relevant results with the patient. Diagnosis, plan of care and treatment options were also discussed in detail with the patient. Opportunity provided to ask questions and answers provided to her apparent satisfaction. Provided instructions to call our clinic with any problems, questions or concerns prior to return visit. I recommended to continue follow-up with PCP and sub-specialists. She verbalized understanding and agreed with the plan.   NCCN guidelines have been consulted in the planning of this patient's care.  I spent a total of 30 minutes during this encounter with the patient including review of chart and various tests results, discussions about plan of care and coordination of care plan.  Chinita Patten, MD  03/18/2024 4:46 PM  [facility] - A DEPT OF [facility] [address] [place] [address] Dept: [phone] Dept Fax: [phone]    CHIEF COMPLAINT/ REASON FOR VISIT:   Multifocal hepatocellular carcinoma, diagnosed based on MRI  findings.  Current Treatment: Systemic treatment with atezolizumab  plus bevacizumab  starting from 09/11/2023.  INTERVAL HISTORY:    Discussed the use of AI scribe software for clinical note transcription with the patient, who gave verbal consent to proceed.  History of Present Illness Atlee Villers is a 48 year old female who presents for follow-up after IV fluid administration.  She feels better after receiving IV fluids, which has improved her overall well-being and relieved some symptoms. She has been experiencing constipation, which she addressed by taking Miralax  last night, resulting in three bowel movements this morning.  She stopped experiencing bleeding last night and the night before. No current pain or nausea, and she does not require medication for these symptoms at this time.  At home, she is attempting to maintain hydration by drinking small amounts of water, Gatorade, and Pedialyte, although she finds it challenging due to nausea.  She has a history of skin issues that sometimes appear and disappear without treatment. No itching associated with these skin changes.    I have reviewed the past medical history, past surgical history, social history and family history with the patient and they are unchanged from previous note.  HISTORY OF PRESENT ILLNESS:   ONCOLOGY HISTORY:   48 y.o. Hispanic lady with no significant past medical history, presented to the emergency department on 08/21/2023 with complaints of right-sided abdominal pain, nausea, vomiting and decreased oral intake for at least 10 days prior to arrival.   On arrival to ED, she was found to have anemia with hemoglobin of 8, white count normal at 6100, platelet count 316,000.  CMP showed elevated  AST of 80, ALT increased at 79, bilirubin elevated at 1.8.  Elevated ammonia level of 62.   Right upper quadrant ultrasound showed large heterogenoussolid mass within the liver measuring up to 16.9 cm,  suspicious for malignancy. Recommend further evaluation with contrast-enhanced CT or MRI.   CT abdomen pelvis showed: 15.5 x 12.5 cm solid well-circumscribed mass within the right hepatic lobe. Areas of central low-density suggest possibility of central scar. There may be a large feeding vessel. Favor focal nodular hyperplasia although fibrolamellar HCC can have a similar appearance. Recommend further evaluation MRI without and with contrast.   Left spontaneous splenorenal shunt suggest portal venous hypertension. No evidence for cirrhosis. This may be related to mass effect and compression of the portal vein from the large mass.   She was recently diagnosed with chickenpox about 20 days prior to this hospitalization.  Still had some itching from vesicles.   She was admitted for further evaluation and management.   MRI of the abdomen with liver protocol on 08/21/2023 showed numerous hepatic lesions are noted, with a dominant lesion which replaces much of the right lobe of the liver estimated to measure approximately 16.5 x 12.7 x 17.7 cm. This lesion is heterogeneous in signal intensity on T1 and T2 weighted images, but clearly has internal areas of hypervascular enhancement on early phase post gadolinium imaging, with persistent low-level enhancement on more delayed imaging. The other dominant lesion is centered in the superior aspect of the liver, likely within the superior aspect of segment 4A estimated to measure approximately 8.1 x 6.8 x 7.8 cm, with similar imaging characteristics to the previously described lesion, although with greater degree of internal washout and probable pseudo capsule on delayed imaging. Multiple other smaller hypervascular lesions are apparent on arterial phase imaging throughout all aspects of the liver, some of which demonstrate delayed washout.   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   On 08/21/2023, AFP was significantly elevated at 32,160  ng/mL.     She was also diagnosed with hepatitis B infection on additional workup.   CT chest on 08/23/2023 showed no evidence of intrathoracic metastatic disease.   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  Her case was discussed in GI tumor conference on 09/09/2023.  Reviewed images with the team and confirmed multifocal hepatocellular carcinoma.   Plan made for systemic treatments with atezolizumab  plus bevacizumab .  Started from 09/11/2023.  Restaging MRI of the liver on 12/14/2023 showed stable disease without evidence of disease progression or new disease.  Plan to continue current management.  However given progressive gum bleeding, Avastin  was held from 01/15/2024.  Oncology History  Hepatocellular carcinoma (HCC)  08/22/2023 Initial Diagnosis   Hepatocellular carcinoma (HCC)   08/27/2023 Cancer Staging   Staging form: Liver, AJCC 8th Edition - Clinical: Stage IIIA (cT3, cN0, cM0) - Signed by [name], MD on 08/27/2023   09/11/2023 -  Chemotherapy   Patient is on Treatment Plan : HEPATOCELLULAR Atezolizumab  + Bevacizumab  q21d         REVIEW OF SYSTEMS:   Review of Systems - Oncology  All other pertinent systems were reviewed with the patient and are negative.  ALLERGIES: She has no known allergies.  MEDICATIONS:  Current Outpatient Medications  Medication Sig Dispense Refill   ALPRAZolam  (XANAX ) 0.5 MG tablet Take 1 tablet (0.5 mg total) by mouth at bedtime as needed for anxiety. 30 tablet 0   lidocaine -prilocaine  (EMLA ) cream Apply 1 Application topically as needed. 30 g 3  morphine  20 MG/5ML solution Take 1.25 - 5 mL by mouth every 4-6 hours as needed for pain depending on severity of pain 500 mL 0   ondansetron  (ZOFRAN ) 8 MG tablet Take 1 tablet (8 mg total) by mouth every 8 (eight) hours as needed for nausea or vomiting. 60 tablet 2   pantoprazole  (PROTONIX ) 40 MG tablet Take 1 tablet (40 mg total) by mouth daily at 6 (six) AM. 30 tablet 2   phytonadione  (VITAMIN K) 5 MG  tablet Take 2 tablets (10 mg total) by mouth daily. (Patient taking differently: Take 10 mg by mouth daily. Pt reports all done) 6 tablet 0   prochlorperazine  (COMPAZINE ) 10 MG tablet Take 1 tablet (10 mg total) by mouth every 6 (six) hours as needed for nausea or vomiting. 30 tablet 1   tenofovir  alafenamide (VEMLIDY ) 25 MG tablet Take 1 tablet (25 mg total) by mouth daily. 30 tablet 11   No current facility-administered medications for this visit.   Facility-Administered Medications Ordered in Other Visits  Medication Dose Route Frequency Provider Last Rate Last Admin   morphine  (PF) 2 MG/ML injection 2 mg  2 mg Intravenous Q2H PRN Durell Lofaso, MD   2 mg at 03/18/24 1053     VITALS:   Blood pressure (!) 105/54, pulse 84, temperature 98 F (36.7 C), temperature source Temporal, resp. rate 16, height 5' (1.524 m), weight 105 lb (47.6 kg), SpO2 100%.  Wt Readings from Last 3 Encounters:  03/18/24 105 lb (47.6 kg)  03/16/24 108 lb 2 oz (49 kg)  02/26/24 108 lb (49 kg)    Body mass index is 20.51 kg/m.    Onc Performance Status - 03/18/24 0950       ECOG Perf Status   ECOG Perf Status Ambulatory and capable of all selfcare but unable to carry out any work activities.  Up and about more than 50% of waking hours      KPS SCALE   KPS % SCORE Normal activity with effort, some s/s of disease             PHYSICAL EXAM:   Physical Exam Constitutional:      General: She is not in acute distress.    Appearance: Normal appearance.  HENT:     Head: Normocephalic and atraumatic.  Eyes:     General: No scleral icterus.    Conjunctiva/sclera: Conjunctivae normal.  Cardiovascular:     Rate and Rhythm: Normal rate and regular rhythm.     Heart sounds: Normal heart sounds.  Pulmonary:     Effort: Pulmonary effort is normal.     Breath sounds: Normal breath sounds.  Abdominal:     Palpations: There is mass (stable hepatomegaly).  Musculoskeletal:     Right lower leg: No  edema.     Left lower leg: No edema.  Neurological:     General: No focal deficit present.     Mental Status: She is alert.  Psychiatric:        Mood and Affect: Mood normal.        Behavior: Behavior normal.      LABORATORY DATA:   I have reviewed the data as listed.  Results for orders placed or performed in visit on 03/18/24  Cortisol  Result Value Ref Range   Cortisol, Plasma 16.6 ug/dL  Total Protein, Urine dipstick  Result Value Ref Range   Protein, ur NEGATIVE NEGATIVE mg/dL  TSH  Result Value Ref Range   TSH 2.360  0.350 - 4.500 uIU/mL  CMP (Cancer Center only)  Result Value Ref Range   Sodium 134 (L) 135 - 145 mmol/L   Potassium 3.6 3.5 - 5.1 mmol/L   Chloride 102 98 - 111 mmol/L   CO2 24 22 - 32 mmol/L   Glucose, Bld 112 (H) 70 - 99 mg/dL   BUN 9 6 - 20 mg/dL   Creatinine 9.43 9.55 - 1.00 mg/dL   Calcium 9.8 8.9 - 89.6 mg/dL   Total Protein 8.8 (H) 6.5 - 8.1 g/dL   Albumin 4.0 3.5 - 5.0 g/dL   AST 764 (HH) 15 - 41 U/L   ALT 110 (H) 0 - 44 U/L   Alkaline Phosphatase 97 38 - 126 U/L   Total Bilirubin 1.0 0.0 - 1.2 mg/dL   GFR, Estimated >39 >39 mL/min   Anion gap 8 5 - 15  CBC with Differential (Cancer Center Only)  Result Value Ref Range   WBC Count 6.4 4.0 - 10.5 K/uL   RBC 2.95 (L) 3.87 - 5.11 MIL/uL   Hemoglobin 8.0 (L) 12.0 - 15.0 g/dL   HCT 76.3 (L) 63.9 - 53.9 %   MCV 80.0 80.0 - 100.0 fL   MCH 27.1 26.0 - 34.0 pg   MCHC 33.9 30.0 - 36.0 g/dL   RDW 76.0 (H) 88.4 - 84.4 %   Platelet Count 420 (H) 150 - 400 K/uL   nRBC 0.0 0.0 - 0.2 %   Neutrophils Relative % 52 %   Neutro Abs 3.4 1.7 - 7.7 K/uL   Lymphocytes Relative 25 %   Lymphs Abs 1.6 0.7 - 4.0 K/uL   Monocytes Relative 9 %   Monocytes Absolute 0.6 0.1 - 1.0 K/uL   Eosinophils Relative 11 %   Eosinophils Absolute 0.7 (H) 0.0 - 0.5 K/uL   Basophils Relative 3 %   Basophils Absolute 0.2 (H) 0.0 - 0.1 K/uL   Immature Granulocytes 0 %   Abs Immature Granulocytes 0.01 0.00 - 0.07 K/uL       RADIOGRAPHIC STUDIES:  Reviewed MRI from 12/14/2023 which showed stable disease.  CT ABDOMEN PELVIS W CONTRAST CLINICAL DATA:  Acute postmenopausal pelvic pain  EXAM: CT ABDOMEN AND PELVIS WITH CONTRAST  TECHNIQUE: Multidetector CT imaging of the abdomen and pelvis was performed using the standard protocol following bolus administration of intravenous contrast.  RADIATION DOSE REDUCTION: This exam was performed according to the departmental dose-optimization program which includes automated exposure control, adjustment of the mA and/or kV according to patient size and/or use of iterative reconstruction technique.  CONTRAST:  OMNIPAQUE  IOHEXOL  300 MG/ML  SOLN  COMPARISON:  02/12/2024  FINDINGS: Lower chest: No acute abnormality. Central venous catheter tip superior right atrium.  Hepatobiliary: Multiple heterogeneously enhancing hepatic masses are again identified predominantly within the right l hepatic lobe with the dominant mass measuring at least 15.2 x 14.5 x 17.3 cm in keeping with the patient's known multifocal hepatocellular carcinoma. Background moderate hepatic steatosis. Marked mass effect upon the hilar structures which appear deviated centrally, however, the portal vein, hepatic veins, and intrahepatic inferior vena cava appear patent. There is hypertrophy of the hepatic arterial vasculature likely representing changes related to the multiple hypervascular masses. No intra or extrahepatic biliary ductal dilation. Cholelithiasis noted without pericholecystic inflammatory change.  Pancreas: Unremarkable  Spleen: Arch splenorenal shunt is identified with multiple varices within left upper quadrant. Spleen is normal in size and no intrasplenic lesions are identified.  Adrenals/Urinary Tract: The right adrenal gland is not  well visualized. Left adrenal gland is unremarkable. The kidneys are normal in size and position. Simple cortical cyst within  the upper pole the right kidney is present which no follow-up imaging is recommended. The kidneys are otherwise unremarkable. Bladder unremarkable.  Stomach/Bowel: There is a proximal partial small bowel obstruction present with the point of transition identified within the mid abdomen at axial image # 62/2 with mild dilation of the more proximal small bowel up to 3.4 cm in diameter and equalization of the intraluminal contents just proximal to this point in keeping with stasis. This appears similar to prior examination. The stomach, small bowel, and large bowel are otherwise unremarkable. Appendix normal.  Vascular/Lymphatic: There is marked narrowing of the suprarenal inferior vena cava secondary to mass effect from the dominant right hepatic mass. Abdominal vasculature is otherwise unremarkable. No pathologic adenopathy within the abdomen and pelvis.  Reproductive: Uterus and bilateral adnexa are unremarkable.  Other: No abdominal wall hernia or abnormality. No abdominopelvic ascites.  Musculoskeletal: No acute or significant osseous findings.  IMPRESSION: 1. Proximal partial small bowel obstruction with the point of transition identified within the mid abdomen at axial image # 62/2 with mild dilation of the more proximal small bowel up to 3.4 cm in diameter. This appears similar to prior examination. 2. Numerous large hepatic masses with the dominant mass measuring at least 15.2 x 14.5 x 17.3 cm in keeping with the patient's known multifocal hepatocellular carcinoma. Marked mass effect upon the hilar structures which appear deviated centrally, however, the portal vein, hepatic veins, and intrahepatic inferior vena cava appear patent. 3. Marked narrowing of the suprarenal inferior vena cava secondary to mass effect from the dominant right hepatic mass. 4. Cholelithiasis.  Electronically Signed   By: Dorethia Molt M.D.   On: 03/06/2024 03:32 DG Pelvis 1-2  Views CLINICAL DATA:  Back pain, rectal pain  EXAM: PELVIS - 1-2 VIEW  COMPARISON:  None Available.  FINDINGS: There is no evidence of pelvic fracture or diastasis. No pelvic bone lesions are seen.  IMPRESSION: Negative.  Electronically Signed   By: Dorethia Molt M.D.   On: 03/06/2024 00:11 DG Lumbar Spine Complete CLINICAL DATA:  Back pain  EXAM: LUMBAR SPINE - COMPLETE 4+ VIEW  COMPARISON:  None Available.  FINDINGS: There is no evidence of lumbar spine fracture. Alignment is normal. Intervertebral disc spaces are maintained.  IMPRESSION: Negative.  Electronically Signed   By: Dorethia Molt M.D.   On: 03/06/2024 00:11    CODE STATUS:  Code Status History     Date Active Date Inactive Code Status Order ID Comments User Context   08/29/2023 0409 08/31/2023 1925 Full Code [id number]  Shona Terry SAILOR, DO ED   08/21/2023 0400 08/23/2023 2127 Full Code [id number]  Lee Kingfisher, MD ED    Questions for Most Recent Historical Code Status (Order [id number])     Question Answer   By: Consent: discussion documented in EHR           Future Appointments  Date Time Provider Department Center  03/21/2024  8:15 AM [facility] 1 [facility] None  03/23/2024  2:00 PM [facility] 7 [facility] None  03/24/2024  9:15 AM [facility] 1 [facility] None     This document was completed utilizing speech recognition software. Grammatical errors, random word insertions, pronoun errors, and incomplete sentences are an occasional consequence of this system due to software limitations, ambient noise, and hardware issues. Any formal questions or concerns about the content, text or information contained within the  body of this dictation should be directly addressed to the provider for clarification.

## 2024-03-18 NOTE — Telephone Encounter (Signed)
 CRITICAL VALUE STICKER  CRITICAL VALUE: AST [id number]  RECEIVER (on-site recipient of call):Heily Carlucci, LPN  DATE & TIME NOTIFIED: 03/18/24 10:08  MESSENGER (representative from lab):Hadassah  MD Vella Patten, MD   TIME OF NOTIFICATION:10:08  RESPONSE:

## 2024-03-18 NOTE — Assessment & Plan Note (Addendum)
 Please review oncology history for additional details and timeline of events.  MRI of the abdomen with liver protocol on 08/21/2023 showed numerous hepatic lesions are noted, with a dominant lesion which replaces much of the right lobe of the liver estimated to measure approximately 16.5 x 12.7 x 17.7 cm. This lesion is heterogeneous in signal intensity on T1 and T2 weighted images, but clearly has internal areas of hypervascular enhancement on early phase post gadolinium imaging, with persistent low-level enhancement on more delayed imaging. The other dominant lesion is centered in the superior aspect of the liver, likely within the superior aspect of segment 4A estimated to measure approximately 8.1 x 6.8 x 7.8 cm, with similar imaging characteristics to the previously described lesion, although with greater degree of internal washout and probable pseudo capsule on delayed imaging. Multiple other smaller hypervascular lesions are apparent on arterial phase imaging throughout all aspects of the liver, some of which demonstrate delayed washout.   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  This was confirmed during GI tumor conference on 09/09/2023.  -Previously undiagnosed hepatitis B could have been the contributing factor. -Not a candidate for liver transplant or surgical resection because of the extent of disease. -Discussed diagnosis, prognosis, plan of care, treatment options.  Reviewed NCCN guidelines.   On 08/21/2023, AFP was significantly elevated at 32,160 ng/mL.     CT chest on 08/23/2023 showed no evidence of intrathoracic metastatic disease.  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  Plan made for systemic treatments with atezolizumab  plus bevacizumab .  Started this from 09/11/2023.  She has been tolerating treatments well without any major side effects.  Restaging MRI of the liver on 12/14/2023 showed stable disease without evidence of disease progression or new disease.  She  had CT abdomen and pelvis on 02/12/2024 when she was hospitalized and it also showed stable disease.  Plan was to continue current management.  However given progressive gum bleeding, we started holding bevacizumab  to see if that would help improve her symptoms.  Last dose of Avastin  was on 12/25/2023.  She has since established with a dentist and has periodontal disease that is causing gum bleeding.  It is slowly improving.  Last dose of atezolizumab  was on 02/26/2024.  After her last dose, patient developed significant fatigue, decreased appetite and inability to eat.  Also has been having progressive abdominal pain which needed multiple ED visits.  CT abdomen and pelvis was obtained on 03/06/2024 which showed signs of partial small bowel obstruction.  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  Though radiologically her disease is stable, clinically she is progressing.  Patient does not want to continue with atezolizumab  as she is afraid of worsening fatigue, failure to thrive.  Will continue supportive care for now.   Will also discuss her case in tumor conference to see if she can get palliative radiation versus ablative measures to help with her pain.   On return visit, we will discuss alternative treatment options including possibility of lenvatinib versus ramucirumab.

## 2024-03-18 NOTE — Patient Instructions (Signed)
Rehidratacin en los adultos Rehydration, Adult La rehidratacin es la reposicin de lquidos, sales y minerales del cuerpo (electrolitos) que se pierden durante la deshidratacin. La deshidratacin se da cuando hay una cantidad insuficiente de agua u otros lquidos en el organismo. Esto ocurre cuando se pierden ms lquidos de los que se ingieren. Entre las causas frecuentes de deshidratacin, se incluyen: No beber la cantidad suficiente de lquidos. Esto puede suceder cuando se enferma o hace actividades que requieren mucha energa, especialmente cuando hace calor. Afecciones que causan prdida de agua u otros lquidos. Estas incluyen diarrea, vmitos, sudoracin y miccin excesiva. Otras enfermedades, como fiebre o infeccin. Determinados medicamentos, como aquellos que eliminan el exceso de lquido del cuerpo (diurticos). Los sntomas de la deshidratacin leve o moderada pueden incluir sed, sequedad de los labios y de la boca, y mareos. Los sntomas de la deshidratacin grave pueden incluir aumento de la frecuencia cardaca, confusin, desmayos e imposibilidad de orinar. En casos graves, es posible que deba recibir lquidos por va intravenosa en el hospital. En casos leves o moderados, generalmente puede rehidratarse en su casa tomando ciertos lquidos que le haya indicado el mdico. Cules son los riesgos? El mdico le hablar sobre los riesgos. El mdico le hablar sobre los riesgos. Esto puede incluir tomar demasiado lquido (sobrehidratacin). Esto es poco frecuente. La sobrehidratacin puede causar un desequilibrio de los electrolitos, insuficiencia renal o una disminucin de los niveles de sal (sodio) en el cuerpo. Materiales necesarios: Necesitar una solucin de rehidratacin oral (SRO) si el mdico se lo indica. Se trata de una bebida que es para tratar la deshidratacin. Se puede conseguir en farmacias y tiendas. Cmo rehidratarse Lquidos Siga las instrucciones del mdico respecto de  qu beber. El tipo y la cantidad de lquido que debe beber dependen de su afeccin. En general, debe elegir las bebidas que prefiera. Si se lo indic el mdico, tome una SRO. Para preparar una SRO, siga las instrucciones del envase. Comience por beber pequeas cantidades, aproximadamente  taza (120 ml) cada 5 a 10 minutos. Aumente lentamente la cantidad que bebe hasta que haya ingerido la cantidad recomendada por el mdico. Beba suficiente lquidos transparentes como para mantener la orina de color amarillo plido. Si le indicaron que beba una SRO, termnela primero y luego empiece a beber lentamente otros lquidos transparentes. Beba lquidos, por ejemplo: Agua. Esto incluye agua con gas y agua saborizada. Si bebe solo agua, puede disminuir mucho la cantidad de sodio en el cuerpo (hiponatremia). Siga el consejo del mdico. Agua de trocitos de hielo que usted succiona. Jugo de frutas con agua agregada (diluido). Bebidas deportivas. Ts de hierbas calientes o fros. Sopas a base de caldo. Leche o productos lcteos. Alimentos Siga las instrucciones del mdico respecto de lo que puede comer mientras se rehidrata. El mdico puede recomendarle que comience a comer despacio alimentos habituales en pequeas cantidades. Consuma los alimentos que contienen un equilibrio saludable de electrolitos, como las bananas, las naranjas, las papas, los tomates y la espinaca. Evite los alimentos grasos y muy azucarados. En algunos casos, puede alimentarse a travs de una sonda de alimentacin que se coloca a travs de la nariz hasta el estmago (sonda nasogstrica o sonda NG). Esto puede hacerse si tiene vmitos o diarrea que no pueden controlarse. Bebidas que se deben evitar  Ciertas bebidas pueden empeorar la deshidratacin. Mientras se rehidrata, evite consumir alcohol. Cmo saber si se est recuperando de la deshidratacin Es posible que est mejorando si: Orina con ms frecuencia que antes de   comenzar la  rehidratacin. Su orina es de color amarillo plido. Mejora el nivel de energa. Vomita con menos frecuencia. Tiene diarrea con menos frecuencia. El apetito mejora o vuelve a la normalidad. Se siente menos mareado o menos aturdido. El color y el tono de la piel comienzan a lucir ms normales. Siga estas instrucciones en su casa: Use los medicamentos de venta libre y los recetados solamente como se lo haya indicado el mdico. No tome comprimidos de sodio. Hacer esto puede aumentar la concentracin de sodio en el organismo (hipernatremia). Comunquese con un mdico si: Contina teniendo sntomas de deshidratacin leve o moderada, por ejemplo: Sed. Labios secos. Sequedad leve en la boca. Mareos. Orina de color oscuro o menos orina que lo normal. Calambres musculares. Sigue con vmitos o diarrea. Solicite ayuda de inmediato si: Tiene sntomas de deshidratacin que empeoran. Tiene fiebre. Tiene un dolor de cabeza intenso. Ha estado vomitando y tiene problemas, por ejemplo: Los vmitos empeoran o no desaparecen. El vmito tiene sangre o una sustancia verde (bilis). No puede comer ni beber sin vomitar. Tiene problemas con la miccin o las deposiciones, por ejemplo: Diarrea que empeora o que no desaparece. Sangre en la materia fecal (heces). Esto puede hacer que la materia fecal sea negra y de aspecto alquitranado. No orina u orina solamente una pequea cantidad de color muy oscuro en el trmino de 6 a 8 horas. Tiene dificultad para respirar. Tiene sntomas que empeoran con el tratamiento. Estos sntomas pueden indicar una emergencia. Solicite ayuda de inmediato. Llame al 911. No espere a ver si los sntomas desaparecen. No conduzca por sus propios medios hasta el hospital. Esta informacin no tiene como fin reemplazar el consejo del mdico. Asegrese de hacerle al mdico cualquier pregunta que tenga. Document Revised: 12/03/2021 Document Reviewed: 12/03/2021 Elsevier Patient Education   2024 [employer]  

## 2024-03-18 NOTE — Progress Notes (Signed)
 Nutrition Follow-up:  Pt with stage III HCC. She is receiving tecentriq  + avastin  (start 3/7). Patient is under the care of Dr. Autumn   9/7 - seen in ED for cancer-related breakthrough pain 9/4 - seen in ED for weakness/dehydration  8/30 - seen in ED for pelvic pain/constipation   Met with patient in infusion. Interpretor present at visit. Patient reports appetite is getting better. Eating 2 large bowls of soup (chicken broth, rice, potato, broccoli) and drinking one Ensure HP (160 kcal, 16g). Patient reports Ensure makes her really full. Bowels are moving 2-3 times daily. She denies diarrhea, nausea, vomiting.    Medications: xanax , morphine  solution, protonix , vit K, compazine , vemlidy   Labs: Na 134, glucose 112  Anthropometrics: Wt 105 lb today decreased 5.4% in 4 weeks - severe for time frame  8/7 - 111 lb 12.8 oz   NUTRITION DIAGNOSIS: Food and nutrition related knowledge deficit continues    INTERVENTION:  Recommend adding chicken or fish to soups for protein  Suggested drinking Ensure at bedtime so allow for additional po during day - encourage high protein high calorie snacks - pt has handout Supportive IVF per MD    MONITORING, EVALUATION, GOAL: wt trends, intake   NEXT VISIT: To be scheduled as needed with treatment

## 2024-03-19 LAB — AFP TUMOR MARKER: AFP, Serum, Tumor Marker: 42547 ng/mL — ABNORMAL HIGH (ref 0.0–6.4)

## 2024-03-19 LAB — T4: T4, Total: 13.4 ug/dL — ABNORMAL HIGH (ref 4.5–12.0)

## 2024-03-20 ENCOUNTER — Other Ambulatory Visit: Payer: Self-pay

## 2024-03-20 LAB — ACTH: C206 ACTH: 29.2 pg/mL (ref 7.2–63.3)

## 2024-03-21 ENCOUNTER — Other Ambulatory Visit (HOSPITAL_COMMUNITY): Payer: Self-pay

## 2024-03-21 ENCOUNTER — Ambulatory Visit (INDEPENDENT_AMBULATORY_CARE_PROVIDER_SITE_OTHER): Payer: Self-pay

## 2024-03-21 VITALS — BP 119/83 | HR 68 | Temp 98.0°F | Resp 16 | Ht 60.0 in | Wt 106.6 lb

## 2024-03-21 DIAGNOSIS — C220 Liver cell carcinoma: Secondary | ICD-10-CM

## 2024-03-21 DIAGNOSIS — E860 Dehydration: Secondary | ICD-10-CM

## 2024-03-21 MED ORDER — HEPARIN SOD (PORK) LOCK FLUSH 100 UNIT/ML IV SOLN
250.0000 [IU] | Freq: Once | INTRAVENOUS | Status: AC | PRN
  Administered 2024-03-21: 250 [IU]

## 2024-03-21 MED ORDER — SODIUM CHLORIDE 0.9 % IV BOLUS (SEPSIS)
1000.0000 mL | Freq: Once | INTRAVENOUS | Status: AC
  Administered 2024-03-21: 1000 mL via INTRAVENOUS
  Filled 2024-03-21: qty 1000

## 2024-03-21 MED ORDER — HEPARIN SOD (PORK) LOCK FLUSH 100 UNIT/ML IV SOLN
500.0000 [IU] | Freq: Once | INTRAVENOUS | Status: DC | PRN
  Filled 2024-03-21: qty 5

## 2024-03-21 NOTE — Progress Notes (Signed)
 Diagnosis: Dehydration  Provider:  Mannam, Praveen MD  Procedure: IV Infusion  IV Type: Port a Cath, IV Location: R Chest  Normal Saline, Dose: 1000 ml  Infusion Start Time: 0827  Infusion Stop Time: 0938  Post Infusion IV Care: Port a Cath Deaccessed/Flushed and heparin  locked  Discharge: Condition: Good, Destination: Home . AVS Declined  Performed by:  Rocky FORBES Sar, RN

## 2024-03-23 ENCOUNTER — Encounter: Payer: Self-pay | Admitting: Oncology

## 2024-03-23 ENCOUNTER — Ambulatory Visit (INDEPENDENT_AMBULATORY_CARE_PROVIDER_SITE_OTHER): Payer: Self-pay

## 2024-03-23 ENCOUNTER — Encounter (HOSPITAL_COMMUNITY): Payer: Self-pay | Admitting: Oncology

## 2024-03-23 VITALS — BP 115/76 | HR 79 | Temp 97.8°F | Resp 16 | Ht 60.0 in | Wt 108.6 lb

## 2024-03-23 DIAGNOSIS — C220 Liver cell carcinoma: Secondary | ICD-10-CM

## 2024-03-23 DIAGNOSIS — E860 Dehydration: Secondary | ICD-10-CM

## 2024-03-23 MED ORDER — HEPARIN SOD (PORK) LOCK FLUSH 100 UNIT/ML IV SOLN
250.0000 [IU] | Freq: Once | INTRAVENOUS | Status: AC | PRN
  Administered 2024-03-23: 250 [IU]
  Filled 2024-03-23: qty 5

## 2024-03-23 MED ORDER — SODIUM CHLORIDE 0.9 % IV BOLUS
1000.0000 mL | Freq: Once | INTRAVENOUS | Status: AC
  Administered 2024-03-23: 1000 mL via INTRAVENOUS
  Filled 2024-03-23: qty 1000

## 2024-03-23 MED ORDER — ONDANSETRON HCL 4 MG/2ML IJ SOLN: 8.0000 mg | Freq: Once | INTRAMUSCULAR | Status: DC

## 2024-03-23 MED ORDER — ONDANSETRON 8 MG/NS 50 ML IVPB: 8.0000 mg | Freq: Once | INTRAVENOUS | Status: DC

## 2024-03-23 NOTE — Progress Notes (Signed)
 Diagnosis: Dehydration  Provider:  Mannam, Praveen MD  Procedure: IV Infusion  IV Type: Port a Cath, IV Location: R Chest  Normal Saline, Dose: 1000 ml  Infusion Start Time: 1446  Infusion Stop Time: 1700  Post Infusion IV Care: Port De-accessed  Discharge: Condition: Good, Destination: Home . AVS Declined  Performed by:  Rocky FORBES Sar, RN

## 2024-03-24 ENCOUNTER — Ambulatory Visit: Payer: Self-pay

## 2024-03-24 ENCOUNTER — Other Ambulatory Visit: Payer: Self-pay

## 2024-03-24 ENCOUNTER — Other Ambulatory Visit (HOSPITAL_COMMUNITY): Payer: Self-pay

## 2024-03-24 VITALS — BP 110/71 | HR 74 | Temp 97.7°F | Resp 18 | Ht 60.0 in | Wt 109.6 lb

## 2024-03-24 DIAGNOSIS — C220 Liver cell carcinoma: Secondary | ICD-10-CM

## 2024-03-24 DIAGNOSIS — E860 Dehydration: Secondary | ICD-10-CM

## 2024-03-24 MED ORDER — ONDANSETRON 8 MG/NS 50 ML IVPB
8.0000 mg | Freq: Once | INTRAVENOUS | Status: AC
  Administered 2024-03-24: 8 mg via INTRAVENOUS
  Filled 2024-03-24: qty 8

## 2024-03-24 MED ORDER — SODIUM CHLORIDE 0.9 % IV BOLUS
1000.0000 mL | Freq: Once | INTRAVENOUS | Status: AC
  Administered 2024-03-24: 1000 mL via INTRAVENOUS
  Filled 2024-03-24: qty 1000

## 2024-03-24 MED ORDER — HEPARIN SOD (PORK) LOCK FLUSH 100 UNIT/ML IV SOLN
500.0000 [IU] | Freq: Once | INTRAVENOUS | Status: AC | PRN
  Administered 2024-03-24: 500 [IU]
  Filled 2024-03-24: qty 5

## 2024-03-24 MED ORDER — ONDANSETRON HCL 4 MG/2ML IJ SOLN: 8.0000 mg | Freq: Once | INTRAMUSCULAR | Status: DC

## 2024-03-24 NOTE — Progress Notes (Signed)
 Specialty Pharmacy Refill Coordination Note  Christina Gutierrez is a 48 y.o. female contacted today regarding refills of specialty medication(s) Tenofovir  Alafenamide Fumarate (VEMLIDY )   Patient requested Pickup at [facility] at [place] date: 03/24/24   Medication will be filled on 03/24/24.

## 2024-03-24 NOTE — Progress Notes (Signed)
 Diagnosis: Dehydration  Provider:  Lonna Coder MD  Procedure: IV Infusion  IV Type: Port a Cath, IV Location: R Chest  Normal Saline, Dose: 1000 ml  Infusion Start Time: 0856  Infusion Stop Time: 1104    Ondansetron , Dose: 8mg   Infusion Start Time: 0856  Infusion Stop Time: 0915  Post Infusion IV Care: Port a Cath Deaccessed/Flushed  Discharge: Condition: Good, Destination: Home . AVS Provided  Performed by:  Alister Staver, RN

## 2024-03-25 ENCOUNTER — Other Ambulatory Visit: Payer: Self-pay

## 2024-03-30 ENCOUNTER — Other Ambulatory Visit: Payer: Self-pay | Admitting: Oncology

## 2024-03-30 ENCOUNTER — Other Ambulatory Visit (HOSPITAL_COMMUNITY): Payer: Self-pay

## 2024-03-30 ENCOUNTER — Encounter: Payer: Self-pay | Admitting: Oncology

## 2024-03-30 ENCOUNTER — Encounter (HOSPITAL_COMMUNITY): Payer: Self-pay | Admitting: Oncology

## 2024-03-30 ENCOUNTER — Ambulatory Visit (INDEPENDENT_AMBULATORY_CARE_PROVIDER_SITE_OTHER): Payer: Self-pay

## 2024-03-30 ENCOUNTER — Other Ambulatory Visit: Payer: Self-pay

## 2024-03-30 VITALS — BP 100/67 | HR 80 | Temp 97.9°F | Resp 18 | Wt 106.4 lb

## 2024-03-30 DIAGNOSIS — E860 Dehydration: Secondary | ICD-10-CM

## 2024-03-30 DIAGNOSIS — G893 Neoplasm related pain (acute) (chronic): Secondary | ICD-10-CM

## 2024-03-30 DIAGNOSIS — C220 Liver cell carcinoma: Secondary | ICD-10-CM

## 2024-03-30 DIAGNOSIS — F5109 Other insomnia not due to a substance or known physiological condition: Secondary | ICD-10-CM

## 2024-03-30 MED ORDER — ALPRAZOLAM 0.5 MG PO TABS
0.5000 mg | ORAL_TABLET | Freq: Every evening | ORAL | 0 refills | Status: DC | PRN
  Filled 2024-03-30: qty 30, 30d supply, fill #0

## 2024-03-30 MED ORDER — HEPARIN SOD (PORK) LOCK FLUSH 100 UNIT/ML IV SOLN
250.0000 [IU] | Freq: Once | INTRAVENOUS | Status: AC | PRN
  Administered 2024-03-30: 250 [IU]
  Filled 2024-03-30: qty 5

## 2024-03-30 MED ORDER — SODIUM CHLORIDE 0.9 % IV BOLUS
1000.0000 mL | Freq: Once | INTRAVENOUS | Status: AC
  Administered 2024-03-30: 1000 mL via INTRAVENOUS
  Filled 2024-03-30: qty 1000

## 2024-03-30 MED ORDER — ONDANSETRON HCL 4 MG/2ML IJ SOLN
8.0000 mg | Freq: Once | INTRAMUSCULAR | Status: AC
  Administered 2024-03-30: 8 mg via INTRAVENOUS
  Filled 2024-03-30: qty 4

## 2024-03-30 NOTE — Progress Notes (Signed)
 The proposed treatment discussed in conference is for discussion purpose only and is not a binding recommendation.  The patients have not been physically examined, or presented with their treatment options.  Therefore, final treatment plans cannot be decided.

## 2024-03-30 NOTE — Progress Notes (Addendum)
 Diagnosis: Dehydration  Provider:  Praveen Mannam MD  Procedure: IV Infusion  IV Type: Port a Cath, IV Location: R Chest  Normal Saline, Dose 1000 ml  Infusion Start Time: 1213  Infusion Stop Time: 1324  Post Infusion IV Care: Port a Cath Deaccessed/Flushed  Discharge: Condition: Good, Destination: Home . AVS Declined  Performed by:  Zayne Draheim, RN

## 2024-03-31 ENCOUNTER — Other Ambulatory Visit: Payer: Self-pay

## 2024-04-01 ENCOUNTER — Ambulatory Visit: Payer: Self-pay | Admitting: *Deleted

## 2024-04-01 VITALS — BP 100/62 | HR 75 | Temp 98.0°F | Resp 16 | Wt 107.4 lb

## 2024-04-01 DIAGNOSIS — E860 Dehydration: Secondary | ICD-10-CM

## 2024-04-01 DIAGNOSIS — C220 Liver cell carcinoma: Secondary | ICD-10-CM

## 2024-04-01 MED ORDER — HEPARIN SOD (PORK) LOCK FLUSH 100 UNIT/ML IV SOLN: 250.0000 [IU] | Freq: Once | INTRAVENOUS | Status: DC | PRN

## 2024-04-01 MED ORDER — ONDANSETRON HCL 4 MG/2ML IJ SOLN: 8.0000 mg | Freq: Once | INTRAMUSCULAR | Status: DC

## 2024-04-01 MED ORDER — SODIUM CHLORIDE 0.9 % IV BOLUS
1000.0000 mL | Freq: Once | INTRAVENOUS | Status: AC
  Administered 2024-04-01: 1000 mL via INTRAVENOUS
  Filled 2024-04-01: qty 1000

## 2024-04-01 MED ORDER — HEPARIN SOD (PORK) LOCK FLUSH 100 UNIT/ML IV SOLN
500.0000 [IU] | Freq: Once | INTRAVENOUS | Status: AC | PRN
  Administered 2024-04-01: 500 [IU]
  Filled 2024-04-01: qty 5

## 2024-04-01 NOTE — Progress Notes (Signed)
 Diagnosis: Dehydration  Provider:  Mannam, Praveen MD  Procedure: IV Infusion  IV Type: Port a Cath, IV Location: R Chest  Normal saline, Dose: 1000 ml  Infusion Start Time: 1318  Infusion Stop Time: 1520  Post Infusion IV Care: Patient declined observation  Discharge: Condition: Good, Destination: Home . AVS Declined  Performed by:  Mathew Therisa NOVAK, RN

## 2024-04-04 ENCOUNTER — Ambulatory Visit (INDEPENDENT_AMBULATORY_CARE_PROVIDER_SITE_OTHER): Payer: Self-pay

## 2024-04-04 VITALS — BP 100/65 | HR 68 | Temp 97.6°F | Resp 18 | Ht 60.0 in | Wt 107.6 lb

## 2024-04-04 DIAGNOSIS — C220 Liver cell carcinoma: Secondary | ICD-10-CM

## 2024-04-04 DIAGNOSIS — E860 Dehydration: Secondary | ICD-10-CM

## 2024-04-04 MED ORDER — SODIUM CHLORIDE 0.9 % IV BOLUS
1000.0000 mL | Freq: Once | INTRAVENOUS | Status: AC
  Administered 2024-04-04: 1000 mL via INTRAVENOUS
  Filled 2024-04-04: qty 1000

## 2024-04-04 MED ORDER — ONDANSETRON HCL 4 MG/2ML IJ SOLN
8.0000 mg | Freq: Once | INTRAMUSCULAR | Status: AC
  Administered 2024-04-04: 8 mg via INTRAVENOUS
  Filled 2024-04-04: qty 4

## 2024-04-04 MED ORDER — HEPARIN SOD (PORK) LOCK FLUSH 100 UNIT/ML IV SOLN
500.0000 [IU] | Freq: Once | INTRAVENOUS | Status: AC | PRN
  Administered 2024-04-04: 500 [IU]
  Filled 2024-04-04: qty 5

## 2024-04-04 NOTE — Progress Notes (Signed)
 Diagnosis: Dehydration  Provider:  Lonna Coder MD  Procedure: IV Infusion  IV Type: Port a Cath, IV Location: R Chest  Normal Saline, Dose: 1000 ml  Infusion Start Time: 1151  Infusion Stop Time: 1402   Ondansetron  HCL, Dose: 8mg   Infusion Start Time: 1147  Post Infusion IV Care: Port a Cath Deaccessed/Flushed  Discharge: Condition: Good, Destination: Home . AVS Declined  Performed by:  Nevea Spiewak, RN

## 2024-04-07 ENCOUNTER — Ambulatory Visit: Payer: Self-pay | Admitting: *Deleted

## 2024-04-07 VITALS — BP 110/74 | HR 84 | Temp 98.0°F | Resp 16 | Ht 60.0 in | Wt 110.0 lb

## 2024-04-07 DIAGNOSIS — C220 Liver cell carcinoma: Secondary | ICD-10-CM

## 2024-04-07 DIAGNOSIS — E860 Dehydration: Secondary | ICD-10-CM

## 2024-04-07 MED ORDER — ONDANSETRON HCL 4 MG/2ML IJ SOLN
8.0000 mg | Freq: Once | INTRAMUSCULAR | Status: AC
  Administered 2024-04-07: 8 mg via INTRAVENOUS
  Filled 2024-04-07: qty 4

## 2024-04-07 MED ORDER — HEPARIN SOD (PORK) LOCK FLUSH 100 UNIT/ML IV SOLN
500.0000 [IU] | Freq: Once | INTRAVENOUS | Status: AC | PRN
  Administered 2024-04-07: 500 [IU]
  Filled 2024-04-07: qty 5

## 2024-04-07 MED ORDER — SODIUM CHLORIDE 0.9 % IV BOLUS
1000.0000 mL | Freq: Once | INTRAVENOUS | Status: DC
  Filled 2024-04-07: qty 1000

## 2024-04-07 NOTE — Progress Notes (Signed)
 Diagnosis: Dehydration  Provider:  Mannam, Praveen MD  Procedure: IV Infusion  IV Type: Peripheral, IV Location: R Chest  Normal saline , Dose: 1000 ml  Infusion Start Time: 1317  Infusion Stop Time: 1417  Post Infusion IV Care: Patient declined observation  Discharge: Condition: Good, Destination: Home . AVS Provided  Performed by:  Mathew Therisa NOVAK, RN

## 2024-04-08 ENCOUNTER — Inpatient Hospital Stay: Payer: Self-pay | Attending: Oncology

## 2024-04-08 ENCOUNTER — Encounter: Payer: Self-pay | Admitting: Oncology

## 2024-04-08 ENCOUNTER — Other Ambulatory Visit: Payer: Self-pay

## 2024-04-08 ENCOUNTER — Other Ambulatory Visit (HOSPITAL_COMMUNITY): Payer: Self-pay

## 2024-04-08 ENCOUNTER — Inpatient Hospital Stay: Payer: Self-pay | Admitting: Oncology

## 2024-04-08 ENCOUNTER — Inpatient Hospital Stay: Payer: Self-pay

## 2024-04-08 ENCOUNTER — Encounter (HOSPITAL_COMMUNITY): Payer: Self-pay | Admitting: Oncology

## 2024-04-08 VITALS — BP 104/65 | HR 79 | Temp 98.2°F | Resp 16 | Ht 60.0 in | Wt 111.0 lb

## 2024-04-08 DIAGNOSIS — G893 Neoplasm related pain (acute) (chronic): Secondary | ICD-10-CM | POA: Insufficient documentation

## 2024-04-08 DIAGNOSIS — C220 Liver cell carcinoma: Secondary | ICD-10-CM

## 2024-04-08 DIAGNOSIS — C7931 Secondary malignant neoplasm of brain: Secondary | ICD-10-CM | POA: Insufficient documentation

## 2024-04-08 DIAGNOSIS — Z79899 Other long term (current) drug therapy: Secondary | ICD-10-CM | POA: Insufficient documentation

## 2024-04-08 DIAGNOSIS — Z79624 Long term (current) use of inhibitors of nucleotide synthesis: Secondary | ICD-10-CM | POA: Insufficient documentation

## 2024-04-08 LAB — CBC WITH DIFFERENTIAL (CANCER CENTER ONLY)
Abs Immature Granulocytes: 0.02 K/uL (ref 0.00–0.07)
Basophils Absolute: 0.2 K/uL — ABNORMAL HIGH (ref 0.0–0.1)
Basophils Relative: 3 %
Eosinophils Absolute: 0.7 K/uL — ABNORMAL HIGH (ref 0.0–0.5)
Eosinophils Relative: 12 %
HCT: 21 % — ABNORMAL LOW (ref 36.0–46.0)
Hemoglobin: 7.2 g/dL — ABNORMAL LOW (ref 12.0–15.0)
Immature Granulocytes: 0 %
Lymphocytes Relative: 24 %
Lymphs Abs: 1.4 K/uL (ref 0.7–4.0)
MCH: 27 pg (ref 26.0–34.0)
MCHC: 34.3 g/dL (ref 30.0–36.0)
MCV: 78.7 fL — ABNORMAL LOW (ref 80.0–100.0)
Monocytes Absolute: 0.5 K/uL (ref 0.1–1.0)
Monocytes Relative: 8 %
Neutro Abs: 3.2 K/uL (ref 1.7–7.7)
Neutrophils Relative %: 53 %
Platelet Count: 263 K/uL (ref 150–400)
RBC: 2.67 MIL/uL — ABNORMAL LOW (ref 3.87–5.11)
RDW: 21.8 % — ABNORMAL HIGH (ref 11.5–15.5)
WBC Count: 6.1 K/uL (ref 4.0–10.5)
nRBC: 0 % (ref 0.0–0.2)

## 2024-04-08 LAB — CMP (CANCER CENTER ONLY)
ALT: 104 U/L — ABNORMAL HIGH (ref 0–44)
AST: 219 U/L (ref 15–41)
Albumin: 3.7 g/dL (ref 3.5–5.0)
Alkaline Phosphatase: 114 U/L (ref 38–126)
Anion gap: 7 (ref 5–15)
BUN: 10 mg/dL (ref 6–20)
CO2: 25 mmol/L (ref 22–32)
Calcium: 9.8 mg/dL (ref 8.9–10.3)
Chloride: 105 mmol/L (ref 98–111)
Creatinine: 0.6 mg/dL (ref 0.44–1.00)
GFR, Estimated: >60 mL/min (ref >60)
Glucose, Bld: 81 mg/dL (ref 70–99)
Potassium: 4.2 mmol/L (ref 3.5–5.1)
Sodium: 137 mmol/L (ref 135–145)
Total Bilirubin: 0.7 mg/dL (ref 0.0–1.2)
Total Protein: 8.5 g/dL — ABNORMAL HIGH (ref 6.5–8.1)

## 2024-04-08 LAB — TSH: TSH: 3.73 u[IU]/mL (ref 0.350–4.500)

## 2024-04-08 LAB — TOTAL PROTEIN, URINE DIPSTICK: Protein, ur: NEGATIVE mg/dL

## 2024-04-08 MED ORDER — MORPHINE SULFATE 20 MG/5ML PO SOLN
ORAL | 0 refills | Status: DC
  Filled 2024-04-08: qty 500, 16d supply, fill #0

## 2024-04-08 MED ORDER — PANTOPRAZOLE SODIUM 40 MG PO TBEC
40.0000 mg | DELAYED_RELEASE_TABLET | Freq: Every day | ORAL | 2 refills | Status: DC
  Filled 2024-04-08: qty 30, 30d supply, fill #0

## 2024-04-08 NOTE — Progress Notes (Signed)
 [facility]  ONCOLOGY CLINIC PROGRESS NOTE   Patient Care Team: [name], MD as PCP - General (Oncology)  PATIENT NAME: Christina Gutierrez   MR#: [id number] DOB: 03/19/1976  Date of visit: 04/08/2024   ASSESSMENT & PLAN:   Christina Gutierrez is a 48 y.o. Hispanic lady with no significant past medical history was hospitalized on 08/21/2023 after she presented with worsening right-sided abdominal pain, nausea, vomiting, decreased oral intake.  Workup including MRI of the abdomen showed evidence of multifocal hepatocellular carcinoma.  She was diagnosed with hepatitis B infection during this hospital admission.   Hepatocellular carcinoma (HCC) Please review oncology history for additional details and timeline of events.  MRI of the abdomen with liver protocol on 08/21/2023 showed numerous hepatic lesions are noted, with a dominant lesion which replaces much of the right lobe of the liver estimated to measure approximately 16.5 x 12.7 x 17.7 cm. This lesion is heterogeneous in signal intensity on T1 and T2 weighted images, but clearly has internal areas of hypervascular enhancement on early phase post gadolinium imaging, with persistent low-level enhancement on more delayed imaging. The other dominant lesion is centered in the superior aspect of the liver, likely within the superior aspect of segment 4A estimated to measure approximately 8.1 x 6.8 x 7.8 cm, with similar imaging characteristics to the previously described lesion, although with greater degree of internal washout and probable pseudo capsule on delayed imaging. Multiple other smaller hypervascular lesions are apparent on arterial phase imaging throughout all aspects of the liver, some of which demonstrate delayed washout.   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  This was confirmed during GI tumor conference on 09/09/2023.  -Previously undiagnosed hepatitis B could have  been the contributing factor. -Not a candidate for liver transplant or surgical resection because of the extent of disease. - Previously I discussed diagnosis, prognosis, plan of care, treatment options.  Reviewed NCCN guidelines.   On 08/21/2023, AFP was significantly elevated at 32,160 ng/mL.     CT chest on 08/23/2023 showed no evidence of intrathoracic metastatic disease.  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  Plan made for systemic treatments with atezolizumab  plus bevacizumab .  Started this from 09/11/2023.  She has been tolerating treatments well without any major side effects.  Restaging MRI of the liver on 12/14/2023 showed stable disease without evidence of disease progression or new disease.  She had CT abdomen and pelvis on 02/12/2024 when she was hospitalized and it also showed stable disease.  Plan was to continue current management.  However given progressive gum bleeding, we started holding bevacizumab  to see if that would help improve her symptoms.  Last dose of Avastin  was on 12/25/2023.  She has since established with a dentist and has periodontal disease that is causing gum bleeding.  It is slowly improving.  Last dose of atezolizumab  was on 02/26/2024.  After her last dose, patient developed significant fatigue, decreased appetite and inability to eat.  Also has been having progressive abdominal pain which needed multiple ED visits.  CT abdomen and pelvis was obtained on 03/06/2024 which showed signs of partial small bowel obstruction.  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  Though radiologically her disease is stable, clinically she is progressing.  Patient does not want to continue with atezolizumab  as she is afraid of worsening fatigue, failure to thrive.  Her case was  discussed in GI tumor conference.  Plan made to proceed with palliative radiation and also  IR guided ablation to help with her pain.  Will continue supportive care for now.  She is currently receiving IV fluids on Mondays and Thursdays at her infusion center on [address] and this has helped her significantly and has improved quality of life.  Whenever she is ready to resume treatments, we will discuss alternative treatment options including possibility of lenvatinib versus ramucirumab.  Cancer associated pain She has had progressively worsening abdominal pain from the malignancy.  Currently taking liquid morphine  with relief in symptoms.  Her case was previously discussed in GI tumor conference and consensus opinion is to proceed with palliative radiation and also IR guided ablation to help with pain symptoms.   Anemia in neoplastic disease Anemia with hemoglobin level at 7.2, not requiring blood transfusion.  Abnormal liver function tests Abnormal liver function tests showing improvement compared to three weeks ago, though still elevated.  Gastroesophageal reflux disease GERD managed with pantoprazole  - Send prescription for pantoprazole  to community pharmacy  I reviewed lab results and outside records for this visit and discussed relevant results with the patient. Diagnosis, plan of care and treatment options were also discussed in detail with the patient. Opportunity provided to ask questions and answers provided to her apparent satisfaction. Provided instructions to call our clinic with any problems, questions or concerns prior to return visit. I recommended to continue follow-up with PCP and sub-specialists. She verbalized understanding and agreed with the plan.   NCCN guidelines have been consulted in the planning of this patient's care.  I spent a total of 40 minutes during this encounter with the patient including review of chart and various tests results, discussions about plan of care and coordination of care plan.  Chinita Patten, MD  04/08/2024 7:04 PM  [facility] - A DEPT OF [facility] [address] [place] [address] Dept: [phone] Dept Fax: [phone]    CHIEF COMPLAINT/ REASON FOR VISIT:   Multifocal hepatocellular carcinoma, diagnosed based on MRI findings.  Current Treatment: Systemic treatment with atezolizumab  plus bevacizumab  starting from 09/11/2023.  INTERVAL HISTORY:    Discussed the use of AI scribe software for clinical note transcription with the patient, who gave verbal consent to proceed.  History of Present Illness  Christina Gutierrez is a 48 year old female who presents for follow-up regarding her cancer treatment and symptoms.  She feels better overall with no more significant gum bleeding, although she occasionally experiences slight bleeding which is improving. Her abdominal pain has resolved, and she is eating better.  She continues to receive IV fluids, which she feels are beneficial. She is scheduled to receive these fluids on Mondays and Thursdays.  She experienced significant pain until about a week ago. She has been on a break from immunotherapy and wishes to continue this break as she felt that chemotherapy was causing rapid weight loss. She feels much better now with the IV fluids and the pills prescribed to her.  She is currently taking omeprazole for her stomach and has requested a new prescription for it. She also has a prescription for morphine  for severe pain but is seeking an alternative for less severe pain. She was advised to use over-the-counter Aleve for mild pain, but she does not currently have any.  She mentioned needing a letter for dental cleaning, which requires a signature from  her provider.    I have reviewed the past medical history, past surgical history, social history and family history with the patient and they are unchanged from previous note.  HISTORY OF PRESENT ILLNESS:   ONCOLOGY HISTORY:   48  y.o. Hispanic lady with no significant past medical history, presented to the emergency department on 08/21/2023 with complaints of right-sided abdominal pain, nausea, vomiting and decreased oral intake for at least 10 days prior to arrival.   On arrival to ED, she was found to have anemia with hemoglobin of 8, white count normal at 6100, platelet count 316,000.  CMP showed elevated AST of 80, ALT increased at 79, bilirubin elevated at 1.8.  Elevated ammonia level of 62.   Right upper quadrant ultrasound showed large heterogenoussolid mass within the liver measuring up to 16.9 cm, suspicious for malignancy. Recommend further evaluation with contrast-enhanced CT or MRI.   CT abdomen pelvis showed: 15.5 x 12.5 cm solid well-circumscribed mass within the right hepatic lobe. Areas of central low-density suggest possibility of central scar. There may be a large feeding vessel. Favor focal nodular hyperplasia although fibrolamellar HCC can have a similar appearance. Recommend further evaluation MRI without and with contrast.   Left spontaneous splenorenal shunt suggest portal venous hypertension. No evidence for cirrhosis. This may be related to mass effect and compression of the portal vein from the large mass.   She was recently diagnosed with chickenpox about 20 days prior to this hospitalization.  Still had some itching from vesicles.   She was admitted for further evaluation and management.   MRI of the abdomen with liver protocol on 08/21/2023 showed numerous hepatic lesions are noted, with a dominant lesion which replaces much of the right lobe of the liver estimated to measure approximately 16.5 x 12.7 x 17.7 cm. This lesion is heterogeneous in signal intensity on T1 and T2 weighted images, but clearly has internal areas of hypervascular enhancement on early phase post gadolinium imaging, with persistent low-level enhancement on more delayed imaging. The other dominant lesion is centered in the  superior aspect of the liver, likely within the superior aspect of segment 4A estimated to measure approximately 8.1 x 6.8 x 7.8 cm, with similar imaging characteristics to the previously described lesion, although with greater degree of internal washout and probable pseudo capsule on delayed imaging. Multiple other smaller hypervascular lesions are apparent on arterial phase imaging throughout all aspects of the liver, some of which demonstrate delayed washout.   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   On 08/21/2023, AFP was significantly elevated at 32,160 ng/mL.     She was also diagnosed with hepatitis B infection on additional workup.   CT chest on 08/23/2023 showed no evidence of intrathoracic metastatic disease.   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  Her case was discussed in GI tumor conference on 09/09/2023.  Reviewed images with the team and confirmed multifocal hepatocellular carcinoma.   Plan made for systemic treatments with atezolizumab  plus bevacizumab .  Started from 09/11/2023.  Restaging MRI of the liver on 12/14/2023 showed stable disease without evidence of disease progression or new disease.  Plan to continue current management.  However given progressive gum bleeding, Avastin  was held from 01/15/2024.  Because of progressive fatigue, decreased appetite and other symptoms, patient decided to take a break from immunotherapy.  Her last treatment was on 02/26/2024.  CT abdomen and pelvis was obtained on 03/06/2024 which showed signs of partial small bowel obstruction.  Multifocal hepatocellular  carcinoma was stable in size overall but is now causing pressure symptoms including marked narrowing of the suprarenal inferior vena cava.  Her case was discussed in GI tumor conference.  Plan made to proceed with palliative radiation and also IR guided ablation to help with her pain.  Oncology History  Hepatocellular carcinoma (HCC)  08/22/2023 Initial Diagnosis    Hepatocellular carcinoma (HCC)   08/27/2023 Cancer Staging   Staging form: Liver, AJCC 8th Edition - Clinical: Stage IIIA (cT3, cN0, cM0) - Signed by [name], MD on 08/27/2023   09/11/2023 -  Chemotherapy   Patient is on Treatment Plan : HEPATOCELLULAR Atezolizumab  + Bevacizumab  q21d         REVIEW OF SYSTEMS:   Review of Systems - Oncology  All other pertinent systems were reviewed with the patient and are negative.  ALLERGIES: She has no known allergies.  MEDICATIONS:  Current Outpatient Medications  Medication Sig Dispense Refill   ALPRAZolam  (XANAX ) 0.5 MG tablet Take 1 tablet (0.5 mg total) by mouth at bedtime as needed for anxiety. 30 tablet 0   lidocaine -prilocaine  (EMLA ) cream Apply 1 Application topically as needed. 30 g 3   ondansetron  (ZOFRAN ) 8 MG tablet Take 1 tablet (8 mg total) by mouth every 8 (eight) hours as needed for nausea or vomiting. 60 tablet 2   prochlorperazine  (COMPAZINE ) 10 MG tablet Take 1 tablet (10 mg total) by mouth every 6 (six) hours as needed for nausea or vomiting. 30 tablet 1   tenofovir  alafenamide (VEMLIDY ) 25 MG tablet Take 1 tablet (25 mg total) by mouth daily. 30 tablet 11   morphine  20 MG/5ML solution Take 1.25 - 5 mL by mouth every 4-6 hours as needed for pain depending on severity of pain 500 mL 0   pantoprazole  (PROTONIX ) 40 MG tablet Take 1 tablet (40 mg total) by mouth daily at 6 (six) AM. 30 tablet 2   No current facility-administered medications for this visit.     VITALS:   Blood pressure 104/65, pulse 79, temperature 98.2 F (36.8 C), resp. rate 16, height 5' (1.524 m), weight 111 lb (50.3 kg), SpO2 100%.  Wt Readings from Last 3 Encounters:  04/08/24 111 lb (50.3 kg)  04/07/24 110 lb (49.9 kg)  04/04/24 107 lb 9.6 oz (48.8 kg)    Body mass index is 21.68 kg/m.    Onc Performance Status - 04/08/24 0836       ECOG Perf Status   ECOG Perf Status Ambulatory and capable of all selfcare but unable to carry out any  work activities.  Up and about more than 50% of waking hours      KPS SCALE   KPS % SCORE Normal activity with effort, some s/s of disease             PHYSICAL EXAM:   Physical Exam Constitutional:      General: She is not in acute distress.    Appearance: Normal appearance.  HENT:     Head: Normocephalic and atraumatic.  Eyes:     General: No scleral icterus.    Conjunctiva/sclera: Conjunctivae normal.  Cardiovascular:     Rate and Rhythm: Normal rate and regular rhythm.     Heart sounds: Normal heart sounds.  Pulmonary:     Effort: Pulmonary effort is normal.     Breath sounds: Normal breath sounds.  Abdominal:     Palpations: There is mass (stable hepatomegaly).  Musculoskeletal:     Right lower leg: No edema.  Left lower leg: No edema.  Neurological:     General: No focal deficit present.     Mental Status: She is alert.  Psychiatric:        Mood and Affect: Mood normal.        Behavior: Behavior normal.      LABORATORY DATA:   I have reviewed the data as listed.  Results for orders placed or performed in visit on 04/08/24  Total Protein, Urine dipstick  Result Value Ref Range   Protein, ur NEGATIVE NEGATIVE mg/dL  TSH  Result Value Ref Range   TSH 3.730 0.350 - 4.500 uIU/mL  CMP (Cancer Center only)  Result Value Ref Range   Sodium 137 135 - 145 mmol/L   Potassium 4.2 3.5 - 5.1 mmol/L   Chloride 105 98 - 111 mmol/L   CO2 25 22 - 32 mmol/L   Glucose, Bld 81 70 - 99 mg/dL   BUN 10 6 - 20 mg/dL   Creatinine 9.39 9.55 - 1.00 mg/dL   Calcium 9.8 8.9 - 89.6 mg/dL   Total Protein 8.5 (H) 6.5 - 8.1 g/dL   Albumin 3.7 3.5 - 5.0 g/dL   AST 780 (HH) 15 - 41 U/L   ALT 104 (H) 0 - 44 U/L   Alkaline Phosphatase 114 38 - 126 U/L   Total Bilirubin 0.7 0.0 - 1.2 mg/dL   GFR, Estimated >39 >39 mL/min   Anion gap 7 5 - 15  CBC with Differential (Cancer Center Only)  Result Value Ref Range   WBC Count 6.1 4.0 - 10.5 K/uL   RBC 2.67 (L) 3.87 - 5.11 MIL/uL    Hemoglobin 7.2 (L) 12.0 - 15.0 g/dL   HCT 78.9 (L) 63.9 - 53.9 %   MCV 78.7 (L) 80.0 - 100.0 fL   MCH 27.0 26.0 - 34.0 pg   MCHC 34.3 30.0 - 36.0 g/dL   RDW 78.1 (H) 88.4 - 84.4 %   Platelet Count 263 150 - 400 K/uL   nRBC 0.0 0.0 - 0.2 %   Neutrophils Relative % 53 %   Neutro Abs 3.2 1.7 - 7.7 K/uL   Lymphocytes Relative 24 %   Lymphs Abs 1.4 0.7 - 4.0 K/uL   Monocytes Relative 8 %   Monocytes Absolute 0.5 0.1 - 1.0 K/uL   Eosinophils Relative 12 %   Eosinophils Absolute 0.7 (H) 0.0 - 0.5 K/uL   Basophils Relative 3 %   Basophils Absolute 0.2 (H) 0.0 - 0.1 K/uL   Immature Granulocytes 0 %   Abs Immature Granulocytes 0.02 0.00 - 0.07 K/uL      RADIOGRAPHIC STUDIES:  Reviewed MRI from 12/14/2023 which showed stable disease.  CT ABDOMEN PELVIS W CONTRAST CLINICAL DATA:  Acute postmenopausal pelvic pain  EXAM: CT ABDOMEN AND PELVIS WITH CONTRAST  TECHNIQUE: Multidetector CT imaging of the abdomen and pelvis was performed using the standard protocol following bolus administration of intravenous contrast.  RADIATION DOSE REDUCTION: This exam was performed according to the departmental dose-optimization program which includes automated exposure control, adjustment of the mA and/or kV according to patient size and/or use of iterative reconstruction technique.  CONTRAST:  OMNIPAQUE  IOHEXOL  300 MG/ML  SOLN  COMPARISON:  02/12/2024  FINDINGS: Lower chest: No acute abnormality. Central venous catheter tip superior right atrium.  Hepatobiliary: Multiple heterogeneously enhancing hepatic masses are again identified predominantly within the right l hepatic lobe with the dominant mass measuring at least 15.2 x 14.5 x 17.3 cm in keeping  with the patient's known multifocal hepatocellular carcinoma. Background moderate hepatic steatosis. Marked mass effect upon the hilar structures which appear deviated centrally, however, the portal vein, hepatic veins, and  intrahepatic inferior vena cava appear patent. There is hypertrophy of the hepatic arterial vasculature likely representing changes related to the multiple hypervascular masses. No intra or extrahepatic biliary ductal dilation. Cholelithiasis noted without pericholecystic inflammatory change.  Pancreas: Unremarkable  Spleen: Arch splenorenal shunt is identified with multiple varices within left upper quadrant. Spleen is normal in size and no intrasplenic lesions are identified.  Adrenals/Urinary Tract: The right adrenal gland is not well visualized. Left adrenal gland is unremarkable. The kidneys are normal in size and position. Simple cortical cyst within the upper pole the right kidney is present which no follow-up imaging is recommended. The kidneys are otherwise unremarkable. Bladder unremarkable.  Stomach/Bowel: There is a proximal partial small bowel obstruction present with the point of transition identified within the mid abdomen at axial image # 62/2 with mild dilation of the more proximal small bowel up to 3.4 cm in diameter and equalization of the intraluminal contents just proximal to this point in keeping with stasis. This appears similar to prior examination. The stomach, small bowel, and large bowel are otherwise unremarkable. Appendix normal.  Vascular/Lymphatic: There is marked narrowing of the suprarenal inferior vena cava secondary to mass effect from the dominant right hepatic mass. Abdominal vasculature is otherwise unremarkable. No pathologic adenopathy within the abdomen and pelvis.  Reproductive: Uterus and bilateral adnexa are unremarkable.  Other: No abdominal wall hernia or abnormality. No abdominopelvic ascites.  Musculoskeletal: No acute or significant osseous findings.  IMPRESSION: 1. Proximal partial small bowel obstruction with the point of transition identified within the mid abdomen at axial image # 62/2 with mild dilation of the more  proximal small bowel up to 3.4 cm in diameter. This appears similar to prior examination. 2. Numerous large hepatic masses with the dominant mass measuring at least 15.2 x 14.5 x 17.3 cm in keeping with the patient's known multifocal hepatocellular carcinoma. Marked mass effect upon the hilar structures which appear deviated centrally, however, the portal vein, hepatic veins, and intrahepatic inferior vena cava appear patent. 3. Marked narrowing of the suprarenal inferior vena cava secondary to mass effect from the dominant right hepatic mass. 4. Cholelithiasis.  Electronically Signed   By: Dorethia Molt M.D.   On: 03/06/2024 03:32 DG Pelvis 1-2 Views CLINICAL DATA:  Back pain, rectal pain  EXAM: PELVIS - 1-2 VIEW  COMPARISON:  None Available.  FINDINGS: There is no evidence of pelvic fracture or diastasis. No pelvic bone lesions are seen.  IMPRESSION: Negative.  Electronically Signed   By: Dorethia Molt M.D.   On: 03/06/2024 00:11 DG Lumbar Spine Complete CLINICAL DATA:  Back pain  EXAM: LUMBAR SPINE - COMPLETE 4+ VIEW  COMPARISON:  None Available.  FINDINGS: There is no evidence of lumbar spine fracture. Alignment is normal. Intervertebral disc spaces are maintained.  IMPRESSION: Negative.  Electronically Signed   By: Dorethia Molt M.D.   On: 03/06/2024 00:11    CODE STATUS:  Code Status History     Date Active Date Inactive Code Status Order ID Comments User Context   08/29/2023 0409 08/31/2023 1925 Full Code [id number]  Shona Terry SAILOR, DO ED   08/21/2023 0400 08/23/2023 2127 Full Code [id number]  Lee Kingfisher, MD ED    Questions for Most Recent Historical Code Status (Order [id number])     Question Answer   By:  Consent: discussion documented in EHR           Future Appointments  Date Time Provider Department Center  04/11/2024 11:30 AM [facility] 5 [facility] None  04/12/2024  2:30 PM [facility] NURSE [facility] None  04/12/2024  3:00 PM  Dewey Rush, MD [facility] None  04/14/2024 11:30 AM [facility] 8 [facility] None  04/18/2024 11:30 AM [facility] 2 [facility] None  04/21/2024 11:30 AM [facility] 1 [facility] None  04/25/2024 11:30 AM [facility] 6 [facility] None  04/28/2024 11:30 AM [facility] 3 [facility] None  04/29/2024  8:15 AM [facility] FLUSH [facility] None  04/29/2024  8:45 AM Alira Fretwell, MD [facility] None  04/29/2024  9:30 AM [facility] INFUSION [facility] None  04/29/2024 10:30 AM Ivonne Harlene RAMAN, RD [facility] None  05/02/2024 11:30 AM [facility] 5 [facility] None  05/05/2024 11:30 AM [facility] 5 [facility] None     This document was completed utilizing speech recognition software. Grammatical errors, random word insertions, pronoun errors, and incomplete sentences are an occasional consequence of this system due to software limitations, ambient noise, and hardware issues. Any formal questions or concerns about the content, text or information contained within the body of this dictation should be directly addressed to the provider for clarification.

## 2024-04-08 NOTE — Assessment & Plan Note (Signed)
 Please review oncology history for additional details and timeline of events.  MRI of the abdomen with liver protocol on 08/21/2023 showed numerous hepatic lesions are noted, with a dominant lesion which replaces much of the right lobe of the liver estimated to measure approximately 16.5 x 12.7 x 17.7 cm. This lesion is heterogeneous in signal intensity on T1 and T2 weighted images, but clearly has internal areas of hypervascular enhancement on early phase post gadolinium imaging, with persistent low-level enhancement on more delayed imaging. The other dominant lesion is centered in the superior aspect of the liver, likely within the superior aspect of segment 4A estimated to measure approximately 8.1 x 6.8 x 7.8 cm, with similar imaging characteristics to the previously described lesion, although with greater degree of internal washout and probable pseudo capsule on delayed imaging. Multiple other smaller hypervascular lesions are apparent on arterial phase imaging throughout all aspects of the liver, some of which demonstrate delayed washout.   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  This was confirmed during GI tumor conference on 09/09/2023.  -Previously undiagnosed hepatitis B could have been the contributing factor. -Not a candidate for liver transplant or surgical resection because of the extent of disease. - Previously I discussed diagnosis, prognosis, plan of care, treatment options.  Reviewed NCCN guidelines.   On 08/21/2023, AFP was significantly elevated at 32,160 ng/mL.     CT chest on 08/23/2023 showed no evidence of intrathoracic metastatic disease.  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  Plan made for systemic treatments with atezolizumab  plus bevacizumab .  Started this from 09/11/2023.  She has been tolerating treatments well without any major side effects.  Restaging MRI of the liver on 12/14/2023 showed stable disease without evidence of disease progression or new  disease.  She had CT abdomen and pelvis on 02/12/2024 when she was hospitalized and it also showed stable disease.  Plan was to continue current management.  However given progressive gum bleeding, we started holding bevacizumab  to see if that would help improve her symptoms.  Last dose of Avastin  was on 12/25/2023.  She has since established with a dentist and has periodontal disease that is causing gum bleeding.  It is slowly improving.  Last dose of atezolizumab  was on 02/26/2024.  After her last dose, patient developed significant fatigue, decreased appetite and inability to eat.  Also has been having progressive abdominal pain which needed multiple ED visits.  CT abdomen and pelvis was obtained on 03/06/2024 which showed signs of partial small bowel obstruction.  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  Though radiologically her disease is stable, clinically she is progressing.  Patient does not want to continue with atezolizumab  as she is afraid of worsening fatigue, failure to thrive.  Her case was discussed in GI tumor conference.  Plan made to proceed with palliative radiation and also IR guided ablation to help with her pain.  Will continue supportive care for now.  She is currently receiving IV fluids on Mondays and Thursdays at her infusion center on [address] and this has helped her significantly and has improved quality of life.  Whenever she is ready to resume treatments, we will discuss alternative treatment options including possibility of lenvatinib versus ramucirumab.

## 2024-04-08 NOTE — Progress Notes (Signed)
 CRITICAL VALUE STICKER  CRITICAL VALUE: AST 219  RECEIVER (on-site recipient of call): Rosina   DATE & TIME NOTIFIED: 04/08/24 @ 8:42  MESSENGER (representative from lab): Almarie   MD NOTIFIED: Dr. Autumn   TIME OF NOTIFICATION: 8:42  RESPONSE: patient is scheduled to see provider today

## 2024-04-08 NOTE — Assessment & Plan Note (Signed)
 She has had progressively worsening abdominal pain from the malignancy.  Currently taking liquid morphine  with relief in symptoms.  Her case was previously discussed in GI tumor conference and consensus opinion is to proceed with palliative radiation and also IR guided ablation to help with pain symptoms.

## 2024-04-09 LAB — AFP TUMOR MARKER: AFP, Serum, Tumor Marker: 33490 ng/mL — ABNORMAL HIGH (ref 0.0–6.4)

## 2024-04-09 LAB — T4: T4, Total: 11.9 ug/dL (ref 4.5–12.0)

## 2024-04-11 ENCOUNTER — Other Ambulatory Visit: Payer: Self-pay

## 2024-04-11 ENCOUNTER — Emergency Department (HOSPITAL_COMMUNITY)
Admission: EM | Admit: 2024-04-11 | Discharge: 2024-04-11 | Disposition: A | Discharge status: Home / Self Care | Payer: Self-pay

## 2024-04-11 ENCOUNTER — Emergency Department (HOSPITAL_COMMUNITY): Payer: Self-pay

## 2024-04-11 ENCOUNTER — Ambulatory Visit: Payer: Self-pay

## 2024-04-11 ENCOUNTER — Encounter (HOSPITAL_COMMUNITY): Payer: Self-pay

## 2024-04-11 ENCOUNTER — Other Ambulatory Visit (HOSPITAL_COMMUNITY): Payer: Self-pay

## 2024-04-11 VITALS — BP 111/73 | HR 82 | Temp 98.2°F | Resp 18 | Wt 108.8 lb

## 2024-04-11 DIAGNOSIS — C220 Liver cell carcinoma: Secondary | ICD-10-CM

## 2024-04-11 DIAGNOSIS — E860 Dehydration: Secondary | ICD-10-CM

## 2024-04-11 DIAGNOSIS — R519 Headache, unspecified: Secondary | ICD-10-CM | POA: Insufficient documentation

## 2024-04-11 LAB — CBC
HCT: 21.9 % — ABNORMAL LOW (ref 36.0–46.0)
Hemoglobin: 7.1 g/dL — ABNORMAL LOW (ref 12.0–15.0)
MCH: 26.1 pg (ref 26.0–34.0)
MCHC: 32.4 g/dL (ref 30.0–36.0)
MCV: 80.5 fL (ref 80.0–100.0)
Platelets: 299 K/uL (ref 150–400)
RBC: 2.72 MIL/uL — ABNORMAL LOW (ref 3.87–5.11)
RDW: 22.5 % — ABNORMAL HIGH (ref 11.5–15.5)
WBC: 6.3 K/uL (ref 4.0–10.5)
nRBC: 0 % (ref 0.0–0.2)

## 2024-04-11 LAB — BASIC METABOLIC PANEL WITH GFR
Anion gap: 12 (ref 5–15)
BUN: 7 mg/dL (ref 6–20)
CO2: 21 mmol/L — ABNORMAL LOW (ref 22–32)
Calcium: 9.3 mg/dL (ref 8.9–10.3)
Chloride: 99 mmol/L (ref 98–111)
Creatinine, Ser: 0.68 mg/dL (ref 0.44–1.00)
GFR, Estimated: >60 mL/min (ref >60)
Glucose, Bld: 84 mg/dL (ref 70–99)
Potassium: 4.5 mmol/L (ref 3.5–5.1)
Sodium: 132 mmol/L — ABNORMAL LOW (ref 135–145)

## 2024-04-11 MED ORDER — ONDANSETRON HCL 4 MG/2ML IJ SOLN
8.0000 mg | Freq: Once | INTRAMUSCULAR | Status: AC
  Administered 2024-04-11: 8 mg via INTRAVENOUS
  Filled 2024-04-11: qty 4

## 2024-04-11 MED ORDER — DEXAMETHASONE SODIUM PHOSPHATE 10 MG/ML IJ SOLN
10.0000 mg | Freq: Once | INTRAMUSCULAR | Status: AC
  Administered 2024-04-11: 10 mg via INTRAVENOUS
  Filled 2024-04-11: qty 1

## 2024-04-11 MED ORDER — SODIUM CHLORIDE 0.9 % IV BOLUS
1000.0000 mL | Freq: Once | INTRAVENOUS | Status: AC
  Administered 2024-04-11: 1000 mL via INTRAVENOUS
  Filled 2024-04-11: qty 1000

## 2024-04-11 MED ORDER — DIPHENHYDRAMINE HCL 50 MG/ML IJ SOLN
25.0000 mg | Freq: Once | INTRAMUSCULAR | Status: AC
  Administered 2024-04-11: 25 mg via INTRAVENOUS
  Filled 2024-04-11: qty 1

## 2024-04-11 MED ORDER — HEPARIN SOD (PORK) LOCK FLUSH 100 UNIT/ML IV SOLN
500.0000 [IU] | Freq: Once | INTRAVENOUS | Status: AC | PRN
  Administered 2024-04-11: 500 [IU]
  Filled 2024-04-11: qty 5

## 2024-04-11 MED ORDER — FENTANYL CITRATE PF 50 MCG/ML IJ SOSY
50.0000 ug | PREFILLED_SYRINGE | Freq: Once | INTRAMUSCULAR | Status: AC
  Administered 2024-04-11: 50 ug via INTRAVENOUS
  Filled 2024-04-11: qty 1

## 2024-04-11 MED ORDER — PROCHLORPERAZINE EDISYLATE 10 MG/2ML IJ SOLN
10.0000 mg | Freq: Once | INTRAMUSCULAR | Status: AC
  Administered 2024-04-11: 10 mg via INTRAVENOUS
  Filled 2024-04-11: qty 2

## 2024-04-11 NOTE — Progress Notes (Signed)
 Radiation Oncology         [phone]) [phone] ________________________________  Name: Christina Gutierrez        MRN: [id number]  Date of Service: 04/12/2024 DOB: 1976-03-11  RR:Ejdjf, Chinita, MD  [name], MD     REFERRING PHYSICIAN: [name], MD   DIAGNOSIS: The encounter diagnosis was Hepatocellular carcinoma (HCC).   HISTORY OF PRESENT ILLNESS: Christina Gutierrez is a 48 y.o. female seen at the request of Dr. Autumn for a newly diagnosed hepatocellular carcinoma. The patient did not have a history of liver disease but presented in February 2025 with abdominal pain for approximately 2 weeks time.  She was found to have anemia, and elevated white blood cell and platelet count and evidence of elevated liver chemistries and liver function tests with an ammonia level of 62 and a CT of the abdomen pelvis at that time showing a 15.5 cm well-circumscribed mass in the right hepatic lobe as well as other areas of focal hyperplasia.  By ultrasound in the right upper quadrant a 16.9 cm solid mass was noted.  A left spontaneous splenorenal shunt suggested portal venous hypertension with no evidence for cirrhosis and an MRI on 08/21/2023 showed numerous lesions in the liver much of the right lobe however was taken over by a tumor measuring up to 17.7 cm and another dominant lesion in the superior aspect of the liver in segment 4A measured up to 7.8 cm.  Multiple other small hypervascular lesions were noted.  An AFP at that time was elevated at 32,160.  She was diagnosed with active hepatitis B infection and CT of the chest on 08/23/2023 showed no evidence of intrathoracic disease.  Her case was reviewed in a multidisciplinary conference and she began systemic immunotherapy beginning on 09/11/2023.  Restaging MRI on 12/14/2023 showed stability without evidence of progression and because of symptoms related to decreased appetite and fatigue she took a break from immunotherapy with her last treatment  on 02/26/2024. Restaging CT abdomen pelvis on 03/06/2024 showed signs of partial small bowel obstruction and stability in her hepatocellular carcinoma in size but pressure involving narrowing of the suprarenal inferior vena cava.  She is seen to consider palliative radiation to the liver and potentially meet with interventional radiology to discuss ablation for her pain.    PREVIOUS RADIATION THERAPY: No   PAST MEDICAL HISTORY:  Past Medical History:  Diagnosis Date   Cancer (HCC)    Metabolic acidosis 08/21/2023       PAST SURGICAL HISTORY: Past Surgical History:  Procedure Laterality Date   CESAREAN SECTION     IR IMAGING GUIDED PORT INSERTION  09/22/2023     FAMILY HISTORY: No family history on file.   SOCIAL HISTORY:  reports that she has never smoked. She has never used smokeless tobacco. She reports that she does not drink alcohol and does not use drugs.  The patient is married and lives in [place].  She is originally from***.   ALLERGIES: Patient has no known allergies.   MEDICATIONS:  Current Outpatient Medications  Medication Sig Dispense Refill   ALPRAZolam  (XANAX ) 0.5 MG tablet Take 1 tablet (0.5 mg total) by mouth at bedtime as needed for anxiety. 30 tablet 0   lidocaine -prilocaine  (EMLA ) cream Apply 1 Application topically as needed. 30 g 3   morphine  20 MG/5ML solution Take 1.25 - 5 mL by mouth every 4-6 hours as needed for pain depending on severity of pain 500 mL 0   ondansetron  (ZOFRAN ) 8 MG  tablet Take 1 tablet (8 mg total) by mouth every 8 (eight) hours as needed for nausea or vomiting. 60 tablet 2   pantoprazole  (PROTONIX ) 40 MG tablet Take 1 tablet (40 mg total) by mouth daily at 6 (six) AM. 30 tablet 2   prochlorperazine  (COMPAZINE ) 10 MG tablet Take 1 tablet (10 mg total) by mouth every 6 (six) hours as needed for nausea or vomiting. 30 tablet 1   tenofovir  alafenamide (VEMLIDY ) 25 MG tablet Take 1 tablet (25 mg total) by mouth daily. 30 tablet 11    No current facility-administered medications for this visit.   Facility-Administered Medications Ordered in Other Visits  Medication Dose Route Frequency Provider Last Rate Last Admin   heparin  lock flush 100 unit/mL  500 Units Intracatheter Once PRN Pasam, Avinash, MD         REVIEW OF SYSTEMS: On review of systems, the patient reports that *** is doing well overall. *** denies any chest pain, shortness of breath, cough, fevers, chills, night sweats, unintended weight changes. *** denies any bowel or bladder disturbances, and denies abdominal pain, nausea or vomiting. *** denies any new musculoskeletal or joint aches or pains. A complete review of systems is obtained and is otherwise negative.     PHYSICAL EXAM:  Wt Readings from Last 3 Encounters:  04/11/24 108 lb 12.8 oz (49.4 kg)  04/08/24 111 lb (50.3 kg)  04/07/24 110 lb (49.9 kg)   Temp Readings from Last 3 Encounters:  04/11/24 98.5 F (36.9 C) (Oral)  04/08/24 98.2 F (36.8 C)  04/07/24 98 F (36.7 C)   BP Readings from Last 3 Encounters:  04/11/24 103/66  04/08/24 104/65  04/07/24 110/74   Pulse Readings from Last 3 Encounters:  04/11/24 76  04/08/24 79  04/07/24 84    /10  In general this is a well appearing hispanic female in no acute distress. She's alert and oriented x4 and appropriate throughout the examination. Cardiopulmonary assessment is negative for acute distress and she exhibits normal effort.     ECOG = ***  0 - Asymptomatic (Fully active, able to carry on all predisease activities without restriction)  1 - Symptomatic but completely ambulatory (Restricted in physically strenuous activity but ambulatory and able to carry out work of a light or sedentary nature. For example, light housework, office work)  2 - Symptomatic, <50% in bed during the day (Ambulatory and capable of all self care but unable to carry out any work activities. Up and about more than 50% of waking hours)  3 -  Symptomatic, >50% in bed, but not bedbound (Capable of only limited self-care, confined to bed or chair 50% or more of waking hours)  4 - Bedbound (Completely disabled. Cannot carry on any self-care. Totally confined to bed or chair)  5 - Death   Raylene MM, Creech RH, Tormey DC, et al. [phone]). Toxicity and response criteria of the [facility]. Am. DOROTHA Bridges. Oncol. 5 (6): 649-55    LABORATORY DATA:  Lab Results  Component Value Date   WBC 6.1 04/08/2024   HGB 7.2 (L) 04/08/2024   HCT 21.0 (L) 04/08/2024   MCV 78.7 (L) 04/08/2024   PLT 263 04/08/2024   Lab Results  Component Value Date   NA 137 04/08/2024   K 4.2 04/08/2024   CL 105 04/08/2024   CO2 25 04/08/2024   Lab Results  Component Value Date   ALT 104 (H) 04/08/2024   AST 219 (HH) 04/08/2024  ALKPHOS 114 04/08/2024   BILITOT 0.7 04/08/2024      RADIOGRAPHY: No results found.     IMPRESSION/PLAN: 1. Bulky, multifocal hepatocellular carcinoma. Dr. Dewey discusses the patient's course to date. He offers a palliative course of radiotherapy to the bulky liver disease. We discussed the risks, benefits, short, and long term effects of radiotherapy, as well as the palliative intent, and the patient is interested in proceeding. Dr. Dewey discusses the delivery and logistics of radiotherapy and anticipates a course of 2 weeks of radiotherapy to the dominant liver mass. Written consent is obtained and placed in the chart, a copy was provided to the patient. The patient will be contacted to coordinate treatment planning by our simulation department. ***   In a visit lasting *** minutes, greater than 50% of the time was spent face to face discussing the patient's condition, in preparation for the discussion, and coordinating the patient's care. A spanish speaking medical interpretor was available and assisted during today's encounter.  The above documentation reflects my direct findings during this shared  patient visit. Please see the separate note by Dr. Dewey on this date for the remainder of the patient's plan of care.    Donald KYM Husband, [facility]   **Disclaimer: This note was dictated with voice recognition software. Similar sounding words can inadvertently be transcribed and this note may contain transcription errors which may not have been corrected upon publication of note.**

## 2024-04-11 NOTE — ED Triage Notes (Signed)
 Patient said she has a headache for 3 days. Unable to sleep. Has been vomiting and feels nauseous. Has liver and stomach cancer.

## 2024-04-11 NOTE — Progress Notes (Signed)
 Diagnosis: Dehydration  Provider:  Praveen Mannam MD  Procedure: IV Infusion  IV Type: Port a Cath, IV Location: R Chest  Normal Saline, Dose: 1000 ml  Infusion Start Time: 1138  Infusion Stop Time: 1245  Post Infusion IV Care: Port a Cath Deaccessed/Flushed and heparin  locked.  Discharge: Condition: Good, Destination: Home . AVS Declined  Performed by:  Gurfateh Mcclain, RN

## 2024-04-11 NOTE — ED Provider Notes (Signed)
 [place] EMERGENCY DEPARTMENT AT [facility] Provider Note   CSN: [id number] Arrival date & time: 04/11/24  1844     Patient presents with: Headache   Christina Gutierrez is a 48 y.o. female.   48 year old female with past medical history of liver and pancreatic cancer who is currently undergoing chemotherapy presenting to the emergency department today with headache.  Patient states that she has had similar headaches in the past.  She reports there is a global headache that is associated with nausea and vomiting.  States that she has been afebrile.  Denies any neck pain or stiffness.  She states that in the past that IV medications worked for her.  Looking back it does appear that she has been treated for migrainous type headaches in the past.  The patient denies any focal weakness, numbness, or tingling.  The patient states this headache has been going on for 3 days.  Is gradually worsening.  Spanish interpreter services used for interview   Headache      Prior to Admission medications   Medication Sig Start Date End Date Taking? Authorizing Provider  ALPRAZolam  (XANAX ) 0.5 MG tablet Take 1 tablet (0.5 mg total) by mouth at bedtime as needed for anxiety. 03/30/24   Pasam, Chinita, MD  lidocaine -prilocaine  (EMLA ) cream Apply 1 Application topically as needed. 02/26/24   Pasam, Chinita, MD  morphine  20 MG/5ML solution Take 1.25 - 5 mL by mouth every 4-6 hours as needed for pain depending on severity of pain 04/08/24   Pasam, Avinash, MD  ondansetron  (ZOFRAN ) 8 MG tablet Take 1 tablet (8 mg total) by mouth every 8 (eight) hours as needed for nausea or vomiting. 01/15/24   Pasam, Chinita, MD  pantoprazole  (PROTONIX ) 40 MG tablet Take 1 tablet (40 mg total) by mouth daily at 6 (six) AM. 04/08/24   Pasam, Avinash, MD  prochlorperazine  (COMPAZINE ) 10 MG tablet Take 1 tablet (10 mg total) by mouth every 6 (six) hours as needed for nausea or vomiting. 02/11/24   Hanford Powell BRAVO,  NP  tenofovir  alafenamide (VEMLIDY ) 25 MG tablet Take 1 tablet (25 mg total) by mouth daily. 09/22/23   Fleeta Kathie Jomarie LOISE, MD    Allergies: Patient has no known allergies.    Review of Systems  Neurological:  Positive for headaches.  All other systems reviewed and are negative.   Updated Vital Signs BP 100/65   Pulse 78   Temp 98.5 F (36.9 C)   Resp 17   Ht 5' (1.524 m)   Wt 50 kg   SpO2 96%   BMI 21.53 kg/m   Physical Exam Vitals and nursing note reviewed.   Gen: NAD Eyes: PERRL, EOMI HEENT: no oropharyngeal swelling Neck: trachea midline, no meningismus Resp: clear to auscultation bilaterally Card: RRR, no murmurs, rubs, or gallops Abd: nontender, nondistended Extremities: no calf tenderness, no edema Vascular: 2+ radial pulses bilaterally, 2+ DP pulses bilaterally Neuro: Cranial nerves intact, equal strength and sensation throughout bilateral upper and lower extremities with no dysmetria on finger-nose testing Skin: no rashes Psyc: acting appropriately   (all labs ordered are listed, but only abnormal results are displayed) Labs Reviewed  CBC - Abnormal; Notable for the following components:      Result Value   RBC 2.72 (*)    Hemoglobin 7.1 (*)    HCT 21.9 (*)    RDW 22.5 (*)    All other components within normal limits  BASIC METABOLIC PANEL WITH GFR - Abnormal;  Notable for the following components:   Sodium 132 (*)    CO2 21 (*)    All other components within normal limits    EKG: None  Radiology: CT Head Wo Contrast Result Date: 04/11/2024 CLINICAL DATA:  Brain metastases suspected Patient said she has a headache for 3 days. Unable to sleep. Has been vomiting and feels nauseous. Has liver and stomach cancer. EXAM: CT HEAD WITHOUT CONTRAST TECHNIQUE: Contiguous axial images were obtained from the base of the skull through the vertex without intravenous contrast. RADIATION DOSE REDUCTION: This exam was performed according to the departmental  dose-optimization program which includes automated exposure control, adjustment of the mA and/or kV according to patient size and/or use of iterative reconstruction technique. COMPARISON:  MRI head 02/12/2024 FINDINGS: Brain: No evidence of large-territorial acute infarction. No parenchymal hemorrhage. No mass lesion. No extra-axial collection. No mass effect or midline shift. No hydrocephalus. Basilar cisterns are patent. Vascular: No hyperdense vessel. Atherosclerotic calcifications are present within the cavernous internal carotid arteries. Skull: No acute fracture or focal lesion. Sinuses/Orbits: Paranasal sinuses and mastoid air cells are clear. The orbits are unremarkable. Other: None. IMPRESSION: No acute intracranial abnormality. Please note MRI with and without contrast is much more sensitive for evaluation of brain metastases. Electronically Signed   By: Morgane  Naveau M.D.   On: 04/11/2024 19:28     Procedures   Medications Ordered in the ED  dexamethasone (DECADRON) injection 10 mg (has no administration in time range)  prochlorperazine  (COMPAZINE ) injection 10 mg (10 mg Intravenous Given 04/11/24 1948)  diphenhydrAMINE  (BENADRYL ) injection 25 mg (25 mg Intravenous Given 04/11/24 1948)  fentaNYL  (SUBLIMAZE ) injection 50 mcg (50 mcg Intravenous Given 04/11/24 1949)                                    Medical Decision Making 48 year old female with past medical history pancreatic cancer undergoing chemotherapy presenting to the emergency department today with headache.  Patient reports having similar headaches in the past that have responded to IV medications.  Looking back it does appear that she has been treated with migraine medications.  I will obtain basic labs and treat the patient with Compazine  and Benadryl .  Will also obtain a CT scan of her head to evaluate for obvious mass lesions.  She does not have any findings on exam consistent with meningitis or encephalitis at this time.  I  will reevaluate for ultimate disposition.  The patient's CT scan is unremarkable.  Labs are at her baseline with some anemia.  On reassessment the patient reports that her headache has resolved.  Since the patient has presented similarly in the past I think that it is reasonable to discharge the patient home.  Will give her Decadron to prevent recurrent/rebound headache.  She is discharged with return precautions.  Amount and/or Complexity of Data Reviewed Labs: ordered. Radiology: ordered.  Risk Prescription drug management.        Final diagnoses:  Nonintractable headache, unspecified chronicity pattern, unspecified headache type    ED Discharge Orders     None          Ula Prentice SAUNDERS, MD 04/11/24 2213

## 2024-04-11 NOTE — Discharge Instructions (Signed)
Your workup today was reassuring.  Please follow-up with your doctor and return to the emergency department for worsening symptoms.

## 2024-04-12 ENCOUNTER — Ambulatory Visit
Admission: RE | Admit: 2024-04-12 | Discharge: 2024-04-12 | Disposition: A | Discharge status: Home / Self Care | Payer: Self-pay | Source: Ambulatory Visit | Attending: Radiation Oncology | Admitting: Radiation Oncology

## 2024-04-12 DIAGNOSIS — C220 Liver cell carcinoma: Secondary | ICD-10-CM

## 2024-04-12 NOTE — Progress Notes (Addendum)
 Patient was unable to make it into the clinic for her appointment today.  She opted to speak with the providers by telephone.  [employer] utilized.  GI Location of Tumor / Histology: Hepatocellular carcinoma  Christina Gutierrez presented in early 2025 with complaints of abdominal pain.  Imaging noted a mass in the right hepatic lobe.   CT ABD/Pelvis 03/06/2024: Partial small bowel obstruction.  Multifocal hepatocellular carcinoma was stable in size overall but is now causing pressure including marked narrowing of the suprarenal inferior vena cava.   Biopsies of   Past/Anticipated interventions by surgeon, if any:   Past/Anticipated interventions by medical oncology, if any:  Dr. Autumn 04/08/2024 -Immunotherapy 09/11/2023-02/26/2024, discontinued due to symptoms. -Whenever she is ready to resume treatments, we will discuss alternative treatment options including possibility of lenvatinib versus ramucirumab. -She has had progressively worsening abdominal pain from the malignancy.  Currently taking liquid morphine  with relief in symptoms.   -Her case was previously discussed in GI tumor conference and consensus opinion is to proceed with palliative radiation and also IR guided ablation to help with pain symptoms.    Weight changes, if any:   Bowel/Bladder complaints, if any: No complaints  Nausea / Vomiting, if any: Denies  Pain issues, if any:  Denies  Appetite: She reports she is eating and drinking well.  She was seen in the ER with complaints of abdominal pain, headache and back pain.  She reports all has resolved.    SAFETY ISSUES: Prior radiation?  Pacemaker/ICD?  Possible current pregnancy?  Is the patient on methotrexate?   Current Complaints/Details:

## 2024-04-13 ENCOUNTER — Ambulatory Visit: Payer: Self-pay | Admitting: Radiation Oncology

## 2024-04-14 ENCOUNTER — Ambulatory Visit (INDEPENDENT_AMBULATORY_CARE_PROVIDER_SITE_OTHER): Payer: Self-pay

## 2024-04-14 VITALS — BP 117/74 | HR 80 | Temp 97.7°F | Resp 20 | Wt 106.8 lb

## 2024-04-14 DIAGNOSIS — E860 Dehydration: Secondary | ICD-10-CM

## 2024-04-14 DIAGNOSIS — C220 Liver cell carcinoma: Secondary | ICD-10-CM

## 2024-04-14 MED ORDER — ONDANSETRON HCL 4 MG/2ML IJ SOLN
8.0000 mg | Freq: Once | INTRAMUSCULAR | Status: AC
  Administered 2024-04-14: 8 mg via INTRAVENOUS
  Filled 2024-04-14: qty 4

## 2024-04-14 MED ORDER — HEPARIN SOD (PORK) LOCK FLUSH 100 UNIT/ML IV SOLN
500.0000 [IU] | Freq: Once | INTRAVENOUS | Status: AC | PRN
  Administered 2024-04-14: 500 [IU]
  Filled 2024-04-14: qty 5

## 2024-04-14 MED ORDER — SODIUM CHLORIDE 0.9 % IV BOLUS
1000.0000 mL | Freq: Once | INTRAVENOUS | Status: AC
  Administered 2024-04-14: 1000 mL via INTRAVENOUS
  Filled 2024-04-14: qty 1000

## 2024-04-14 NOTE — Progress Notes (Signed)
 Diagnosis: Hyperlipidemia  Provider:  Praveen Mannam MD  Procedure: IV Push  IV Type: Port a Cath, IV Location: R Chest  Normal Saline, Dose: 1000 ml  Post Infusion IV Care: Port a Cath Deaccessed/Flushed  Discharge: Condition: Good, Destination: Home . AVS Declined  Performed by:  Edwing Figley, RN

## 2024-04-15 ENCOUNTER — Other Ambulatory Visit: Payer: Self-pay

## 2024-04-16 ENCOUNTER — Encounter (HOSPITAL_COMMUNITY): Payer: Self-pay | Admitting: Radiology

## 2024-04-16 ENCOUNTER — Other Ambulatory Visit: Payer: Self-pay

## 2024-04-16 ENCOUNTER — Observation Stay (HOSPITAL_COMMUNITY)
Admission: EM | Admit: 2024-04-16 | Discharge: 2024-04-18 | Disposition: A | Discharge status: Home / Self Care | Payer: Self-pay | Attending: Internal Medicine | Admitting: Internal Medicine

## 2024-04-16 ENCOUNTER — Emergency Department (HOSPITAL_COMMUNITY): Payer: Self-pay

## 2024-04-16 DIAGNOSIS — Z515 Encounter for palliative care: Principal | ICD-10-CM

## 2024-04-16 DIAGNOSIS — F5109 Other insomnia not due to a substance or known physiological condition: Secondary | ICD-10-CM

## 2024-04-16 DIAGNOSIS — B181 Chronic viral hepatitis B without delta-agent: Secondary | ICD-10-CM | POA: Diagnosis present

## 2024-04-16 DIAGNOSIS — R109 Unspecified abdominal pain: Secondary | ICD-10-CM | POA: Diagnosis present

## 2024-04-16 DIAGNOSIS — R1013 Epigastric pain: Secondary | ICD-10-CM

## 2024-04-16 DIAGNOSIS — D638 Anemia in other chronic diseases classified elsewhere: Secondary | ICD-10-CM | POA: Diagnosis present

## 2024-04-16 DIAGNOSIS — Z79899 Other long term (current) drug therapy: Secondary | ICD-10-CM | POA: Insufficient documentation

## 2024-04-16 DIAGNOSIS — C220 Liver cell carcinoma: Secondary | ICD-10-CM | POA: Diagnosis present

## 2024-04-16 DIAGNOSIS — G893 Neoplasm related pain (acute) (chronic): Principal | ICD-10-CM | POA: Diagnosis present

## 2024-04-16 DIAGNOSIS — D649 Anemia, unspecified: Secondary | ICD-10-CM | POA: Insufficient documentation

## 2024-04-16 DIAGNOSIS — Z7189 Other specified counseling: Secondary | ICD-10-CM

## 2024-04-16 DIAGNOSIS — K8020 Calculus of gallbladder without cholecystitis without obstruction: Secondary | ICD-10-CM | POA: Insufficient documentation

## 2024-04-16 DIAGNOSIS — R188 Other ascites: Secondary | ICD-10-CM | POA: Insufficient documentation

## 2024-04-16 LAB — COMPREHENSIVE METABOLIC PANEL WITH GFR
ALT: 163 U/L — ABNORMAL HIGH (ref 0–44)
AST: 315 U/L — ABNORMAL HIGH (ref 15–41)
Albumin: 3.9 g/dL (ref 3.5–5.0)
Alkaline Phosphatase: 111 U/L (ref 38–126)
Anion gap: 13 (ref 5–15)
BUN: 9 mg/dL (ref 6–20)
CO2: 21 mmol/L — ABNORMAL LOW (ref 22–32)
Calcium: 9.8 mg/dL (ref 8.9–10.3)
Chloride: 102 mmol/L (ref 98–111)
Creatinine, Ser: 0.63 mg/dL (ref 0.44–1.00)
GFR, Estimated: >60 mL/min (ref >60)
Glucose, Bld: 101 mg/dL — ABNORMAL HIGH (ref 70–99)
Potassium: 4.4 mmol/L (ref 3.5–5.1)
Sodium: 136 mmol/L (ref 135–145)
Total Bilirubin: 0.9 mg/dL (ref 0.0–1.2)
Total Protein: 8.8 g/dL — ABNORMAL HIGH (ref 6.5–8.1)

## 2024-04-16 LAB — CBC
HCT: 23.9 % — ABNORMAL LOW (ref 36.0–46.0)
Hemoglobin: 7.7 g/dL — ABNORMAL LOW (ref 12.0–15.0)
MCH: 26.1 pg (ref 26.0–34.0)
MCHC: 32.2 g/dL (ref 30.0–36.0)
MCV: 81 fL (ref 80.0–100.0)
Platelets: 342 K/uL (ref 150–400)
RBC: 2.95 MIL/uL — ABNORMAL LOW (ref 3.87–5.11)
RDW: 22.1 % — ABNORMAL HIGH (ref 11.5–15.5)
WBC: 8 K/uL (ref 4.0–10.5)
nRBC: 0 % (ref 0.0–0.2)

## 2024-04-16 LAB — LIPASE, BLOOD: Lipase: 44 U/L (ref 11–51)

## 2024-04-16 LAB — HCG, SERUM, QUALITATIVE: Preg, Serum: NEGATIVE

## 2024-04-16 MED ORDER — ONDANSETRON HCL 4 MG/2ML IJ SOLN
4.0000 mg | Freq: Once | INTRAMUSCULAR | Status: AC
  Administered 2024-04-17: 4 mg via INTRAVENOUS
  Filled 2024-04-16: qty 2

## 2024-04-16 MED ORDER — SODIUM CHLORIDE 0.9 % IV BOLUS
1000.0000 mL | Freq: Once | INTRAVENOUS | Status: AC
  Administered 2024-04-16: 1000 mL via INTRAVENOUS

## 2024-04-16 MED ORDER — FENTANYL CITRATE (PF) 50 MCG/ML IJ SOSY
50.0000 ug | PREFILLED_SYRINGE | Freq: Once | INTRAMUSCULAR | Status: AC
  Administered 2024-04-16: 50 ug via INTRAVENOUS
  Filled 2024-04-16: qty 1

## 2024-04-16 NOTE — ED Provider Notes (Signed)
 [place] EMERGENCY DEPARTMENT AT [facility] Provider Note   CSN: [id number] Arrival date & time: 04/16/24  8044     Patient presents with: Abdominal Pain and Shortness of Breath   Christina Gutierrez is a 48 y.o. female.   48 year old female with a PMH of Hepatocellular carcinoma Stage 3 presents to the ED with a chief complaint epigastric since this morning.  Patient reports she notice her abdomen to be distended this morning, she states I have a small ball that is pushing out around my abdomen.  She reports she was able to have her family member rub this, did have some improvement in her pain but now it feels that is worsening inflamed.  She did have dinner tonight, she feels that she has had decrease in oral intake.  She is endorsing some nausea.  She was diagnosed originally 10 months ago and is currently followed by oncologist Dr. Autumn of oncology. She did have chemotherapy which was stopped approximately 2 months ago due to ongoing weakness.  She is supposed to start radiation this upcoming Wednesday.  He does have aquatic pain medication which she did not take today.  Her last bowel movement was this evening around 630.  She denies any blood in her stool, fever, vomiting.  The history is provided by the patient.  Abdominal Pain Pain location:  Epigastric Associated symptoms: nausea and shortness of breath   Associated symptoms: no chest pain, no chills, no constipation, no diarrhea, no fever, no sore throat and no vomiting   Shortness of Breath Associated symptoms: abdominal pain   Associated symptoms: no chest pain, no fever, no sore throat and no vomiting        Prior to Admission medications   Medication Sig Start Date End Date Taking? Authorizing Provider  ALPRAZolam  (XANAX ) 0.5 MG tablet Take 1 tablet (0.5 mg total) by mouth at bedtime as needed for anxiety. 03/30/24   Pasam, Chinita, MD  lidocaine -prilocaine  (EMLA ) cream Apply 1 Application topically  as needed. 02/26/24   Pasam, Chinita, MD  morphine  20 MG/5ML solution Take 1.25 - 5 mL by mouth every 4-6 hours as needed for pain depending on severity of pain 04/08/24   Pasam, Avinash, MD  ondansetron  (ZOFRAN ) 8 MG tablet Take 1 tablet (8 mg total) by mouth every 8 (eight) hours as needed for nausea or vomiting. 01/15/24   Pasam, Chinita, MD  pantoprazole  (PROTONIX ) 40 MG tablet Take 1 tablet (40 mg total) by mouth daily at 6 (six) AM. 04/08/24   Pasam, Chinita, MD  prochlorperazine  (COMPAZINE ) 10 MG tablet Take 1 tablet (10 mg total) by mouth every 6 (six) hours as needed for nausea or vomiting. 02/11/24   Hanford Powell BRAVO, NP  tenofovir  alafenamide (VEMLIDY ) 25 MG tablet Take 1 tablet (25 mg total) by mouth daily. 09/22/23   Fleeta Kathie Jomarie LOISE, MD    Allergies: Patient has no known allergies.    Review of Systems  Constitutional:  Negative for chills and fever.  HENT:  Negative for sore throat.   Respiratory:  Positive for shortness of breath.   Cardiovascular:  Negative for chest pain.  Gastrointestinal:  Positive for abdominal distention, abdominal pain and nausea. Negative for constipation, diarrhea and vomiting.  Genitourinary:  Negative for flank pain.  Musculoskeletal:  Negative for back pain.  All other systems reviewed and are negative.   Updated Vital Signs BP 103/60   Pulse 75   Temp 98.4 F (36.9 C) (Oral)   Resp  16   Ht 5' (1.524 m)   Wt 48.1 kg   SpO2 98%   BMI 20.70 kg/m   Physical Exam Vitals and nursing note reviewed.  Constitutional:      Appearance: She is ill-appearing.  HENT:     Head: Normocephalic and atraumatic.  Eyes:     General: No scleral icterus. Cardiovascular:     Rate and Rhythm: Normal rate.  Pulmonary:     Effort: Pulmonary effort is normal.     Breath sounds: No wheezing.  Abdominal:     General: Bowel sounds are increased. There is distension.     Palpations: Abdomen is rigid. There is hepatomegaly.     Tenderness: There is  generalized abdominal tenderness and tenderness in the right upper quadrant and epigastric area. There is guarding.  Skin:    General: Skin is warm and dry.  Neurological:     Mental Status: She is alert and oriented to person, place, and time.     (all labs ordered are listed, but only abnormal results are displayed) Labs Reviewed  COMPREHENSIVE METABOLIC PANEL WITH GFR - Abnormal; Notable for the following components:      Result Value   CO2 21 (*)    Glucose, Bld 101 (*)    Total Protein 8.8 (*)    AST 315 (*)    ALT 163 (*)    All other components within normal limits  CBC - Abnormal; Notable for the following components:   RBC 2.95 (*)    Hemoglobin 7.7 (*)    HCT 23.9 (*)    RDW 22.1 (*)    All other components within normal limits  LIPASE, BLOOD  HCG, SERUM, QUALITATIVE  URINALYSIS, ROUTINE W REFLEX MICROSCOPIC  PROTIME-INR  AMMONIA    EKG: None  Radiology: DG Chest 2 View Result Date: 04/16/2024 CLINICAL DATA:  Shortness of breath. Receiving chemotherapy for liver cancer. EXAM: CHEST - 2 VIEW COMPARISON:  January 15, 2013 FINDINGS: A right-sided venous Port-A-Cath is seen with its distal tip noted at the junction of the superior vena cava and right atrium. The heart size and mediastinal contours are within normal limits. Low lung volumes are noted. Mild atelectasis is suspected within the bilateral lung bases. No acute infiltrate, pleural effusion or pneumothorax is identified. The visualized skeletal structures are unremarkable. IMPRESSION: Low lung volumes with mild bibasilar atelectasis. Electronically Signed   By: Suzen Dials M.D.   On: 04/16/2024 21:05     Procedures   Medications Ordered in the ED  ondansetron  (ZOFRAN ) injection 4 mg (has no administration in time range)  sodium chloride  0.9 % bolus 1,000 mL (1,000 mLs Intravenous New Bag/Given 04/16/24 2301)  fentaNYL  (SUBLIMAZE ) injection 50 mcg (50 mcg Intravenous Given 04/16/24 2301)                                     Medical Decision Making Amount and/or Complexity of Data Reviewed Labs: ordered. Radiology: ordered.  Risk Prescription drug management.   This patient presents to the ED for concern of abdominal pain, this involves a number of treatment options, and is a complaint that carries with it a high risk of complications and morbidity.  The differential diagnosis includes obstruction, infection, static disease.   Co morbidities: Discussed in HPI   Brief History:  See HPI.  EMR reviewed including pt PMHx, past surgical history and past visits to ER.  See HPI for more details   Lab Tests:  I ordered and independently interpreted labs.  The pertinent results include:    CBC with no leukocytosis, hemoglobin is at her baseline and actually improved at 7.7.CMP with no electrolyte normality.  Creatinine levels within normal limits.  BUN is normal.  LFTs are worsened than prior lab.Hcg is negative. UA is pending.    Imaging Studies:  CT Abdomen and pelvis showed is pending.   Cardiac Monitoring:  The patient was maintained on a cardiac monitor.  I personally viewed and interpreted the cardiac monitored which showed an underlying rhythm of: NSR 82 EKG non-ischemic   Medicines ordered:  I ordered medication including bolus, fentanyl   for low bp, pain control Reevaluation of the patient after these medicines showed that the patient improved I have reviewed the patients home medicines and have made adjustments as needed  Consults:  Palliative consultation was placed.  Reevaluation:  After the interventions noted above I re-evaluated patient and found that they have :stayed the same  Social Determinants of Health:  The patient's social determinants of health were a factor in the care of this patient  Problem List / ED Course:  Patient presented to the ED with epigastric pain, underlying history of hepatocellular carcinoma stage III followed by Dr.  Autumn, with worsening pain which began today, no nausea, no vomiting.  No fevers.  Blood pressures are slightly soft, did order a bolus along with fentanyl  for pain control.  On exam there is hepatomegaly present, bowel sounds are hyperactive.  She has had some decrease in oral intake over the past couple of days. Lab work today does show CBC with no leukocytosis, hemoglobin is at baseline.  LFTs are worsened than prior.  His T is 315, ALT is 163 but total bili is within normal limits.  Lipase levels within normal limits. According to records she was previously on systemic treatment such as atezolizumab  plus bevacizumab . Started this from 09/11/2023 and last dose of Atezolizumab  was on 02/26/2024. According to records from last visit with Dr. Autumn on April 08, 2024 Her case was discussed in GI tumor conference. Plan made to proceed with palliative radiation and also IR guided ablation to help with her pain I spoke to Dr. Tobie hospitalist service who will admit patient for further management.  Patient is pending CT abdomen and pelvis.  I discussed with the patient along with family members at the bedside the patient will be admitted at this time.  I also placed a palliative consult as she sounds of the last visit with oncology there is some clinical progression of disease. And is agreeable of staying in the hospital, pain is controlled with fentanyl .   Dispostion:  After consideration of the diagnostic results and the patients response to treatment, I feel that the patent would benefit from admission for further management of pain control, palliative consult and oncology consultation.   Portions of this note were generated with Scientist, clinical (histocompatibility and immunogenetics). Dictation errors may occur despite best attempts at proofreading.   Final diagnoses:  Epigastric pain    ED Discharge Orders     None          Maureen Broad, PA-C 04/17/24 0001    Suzette Pac, MD 04/18/24 1147

## 2024-04-16 NOTE — ED Triage Notes (Signed)
 Pt endorses she has liver cancer and she is doing chemo and Wednesday they are going to start doing radiation. She was diagnosed with cancer 10 months ago. She endorses she has a hard not on her abdomen that has been there but today it is hurting her. She endorses nausea, denies diarrhea. Denies fever at home. Her complaint is nausea and pain since this morning. She attends the cancer center here at [facility].

## 2024-04-16 NOTE — ED Triage Notes (Signed)
 Is is also endorsing shortness of breath.

## 2024-04-16 NOTE — H&P (Signed)
 History and Physical    Christina Gutierrez FMW:[id number] DOB: 1976/03/07 DOA: 04/16/2024  PCP: [name], MD  Patient coming from: Home  I have personally briefly reviewed patient's old medical records in [facility]  Chief Complaint: Abdominal pain  HPI: Christina Gutierrez is a Spanish-speaking 48 y.o. female with medical history significant for stage IIIa multifocal hepatocellular carcinoma, anemia of chronic disease, chronic cancer associated pain, chronic hepatitis B who presented to the ED for evaluation of abdominal pain.  Formal remote interpreter services were offered however patient and family declined and family preferred to interpret themselves.  Patient follows with oncology for management of multifocal hepatocellular carcinoma.  She previously underwent systemic treatment with atezolizumab  plus bevacizumab , last treatment 02/26/2024.  She has been having progressive abdominal pain, fatigue, failure to thrive.  She was referred to radiation oncology for palliative radiation and also IR guided ablation.  Seen by Dr. Dewey on 10/7 and was going to be scheduled for simulation last week but patient wanted to hold off to see if she was feeling a bit better.  Patient reports worsening right side abdominal pain this morning.  She has had nausea but no significant emesis.  She reports having regular bowel movement.  She has not had fevers, chills, diaphoresis.  She reports some shortness of breath when taking deep inspiration.  ED Course  Labs/Imaging on admission: I have personally reviewed following labs and imaging studies.  Initial vitals showed BP 97/62, pulse 87, RR 18, temp 98.4 F, SpO2 98% on room air.  Labs showed WBC 8.0, hemoglobin 7.7, platelets 342, sodium 136, potassium 4.4, bicarb 21, BUN 9, creatinine 0.63, serum glucose 101, AST 315, ALT 163, alk phos 111, total bilirubin 0.9, serum hCG negative, lipase 44.  2 view chest x-ray showed low lung  volumes with mild bibasilar atelectasis.  CT abdomen/pelvis with contrast show changes consistent with known multifocal hepatocellular carcinoma.  Previously seen small bowel obstruction has resolved.  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  Cholelithiasis without complicating factors also noted.  Patient was given 1 L normal saline, IV Zofran , IV fentanyl .  The hospitalist service was consulted for admission.  Review of Systems: All systems reviewed and are negative except as documented in history of present illness above.   Past Medical History:  Diagnosis Date   Cancer [facility])    Hepatocellular carcinoma (HCC) 08/22/2023   Metabolic acidosis 08/21/2023    Past Surgical History:  Procedure Laterality Date   CESAREAN SECTION     IR IMAGING GUIDED PORT INSERTION  09/22/2023    Social History: Social History   Tobacco Use   Smoking status: Never   Smokeless tobacco: Never  Vaping Use   Vaping status: Never Used  Substance Use Topics   Alcohol use: No   Drug use: No   No Known Allergies  History reviewed. No pertinent family history.   Prior to Admission medications   Medication Sig Start Date End Date Taking? Authorizing Provider  ALPRAZolam  (XANAX ) 0.5 MG tablet Take 1 tablet (0.5 mg total) by mouth at bedtime as needed for anxiety. 03/30/24   Pasam, Millman, MD  lidocaine -prilocaine  (EMLA ) cream Apply 1 Application topically as needed. 02/26/24   Pasam, Millman, MD  morphine  20 MG/5ML solution Take 1.25 - 5 mL by mouth every 4-6 hours as needed for pain depending on severity of pain 04/08/24   Pasam, Avinash, MD  ondansetron  (ZOFRAN ) 8 MG tablet Take 1 tablet (8 mg total) by mouth every 8 (  eight) hours as needed for nausea or vomiting. 01/15/24   Pasam, Chinita, MD  pantoprazole  (PROTONIX ) 40 MG tablet Take 1 tablet (40 mg total) by mouth daily at 6 (six) AM. 04/08/24   Pasam, Chinita, MD  prochlorperazine  (COMPAZINE ) 10 MG tablet Take 1 tablet (10 mg total) by mouth every  6 (six) hours as needed for nausea or vomiting. 02/11/24   Hanford Powell BRAVO, NP  tenofovir  alafenamide (VEMLIDY ) 25 MG tablet Take 1 tablet (25 mg total) by mouth daily. 09/22/23   Fleeta Kathie Jomarie LOISE, MD    Physical Exam: Vitals:   04/16/24 2031 04/16/24 2102 04/16/24 2215 04/17/24 0045  BP: 98/65 (!) 97/55 103/60   Pulse: 84 77 75   Resp: 16 18 16    Temp:    98 F (36.7 C)  TempSrc:    Oral  SpO2: 99% 99% 98%   Weight: 48.1 kg     Height: 5' (1.524 m)      Constitutional: Chronically ill-appearing woman resting in bed, NAD, calm, comfortable Eyes: EOMI, lids and conjunctivae normal ENMT: Mucous membranes are moist. Posterior pharynx clear of any exudate or lesions.Normal dentition.  Neck: normal, supple, no masses. Respiratory: clear to auscultation bilaterally, no wheezing, no crackles. Normal respiratory effort. No accessory muscle use.  Cardiovascular: Regular rate and rhythm, no murmurs / rubs / gallops. No extremity edema. 2+ pedal pulses. Abdomen: Right upper and epigastric tenderness, hepatomegaly Musculoskeletal: no clubbing / cyanosis. No joint deformity upper and lower extremities. Good ROM, no contractures. Normal muscle tone.  Skin: no rashes, lesions, ulcers. No induration Neurologic: Sensation intact. Strength 5/5 in all 4.  Psychiatric: Normal judgment and insight. Alert and oriented x 3. Normal mood.   EKG: Personally reviewed. Sinus rhythm, rate 82, no acute ischemic.  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 female with medical history significant for stage IIIa multifocal hepatocellular carcinoma, anemia of chronic disease, chronic cancer associated pain, chronic hepatitis B who is admitted with uncontrolled cancer associate abdominal  pain.  Assessment and Plan: Uncontrolled cancer associated abdominal pain: Related to her known multifocal hepatocellular carcinoma.  CT A/P does not show evidence of bowel obstruction or acute changes. - Continue oral morphine  solution as needed - IV morphine  for severe breakthrough pain - Consult palliative medicine - Continue Xanax  0.5 mg nightly as needed for anxiety  Stage IIIa multifocal hepatocellular carcinoma: CT shows known multifocal hepatocellular carcinoma.  Patient follows with oncology Dr. Autumn and is currently off treatment.  She has been referred to radiation oncology for palliative radiation consideration.  Anemia of chronic disease: Hemoglobin is stable at 7.7.  Chronic hepatitis B: Continue Vemlidy .  Patient needs follow-up with ID.   DVT prophylaxis: enoxaparin (LOVENOX) injection 30 mg Start: 04/17/24 1000 Code Status: Full code, discussed with patient and family on admission Family Communication: Spouse and 2 sons at bedside Disposition Plan: From home, dispo pending clinical progress Consults called: None Severity of Illness: The appropriate patient status for this patient is OBSERVATION. Observation status is judged to be reasonable and necessary in order to provide the required intensity of service to ensure the patient's safety. The patient's presenting symptoms, physical exam findings, and initial radiographic and laboratory data in the context of their medical condition is felt to place them at  decreased risk for further clinical deterioration. Furthermore, it is anticipated that the patient will be medically stable for discharge from the hospital within 2 midnights of admission.   Christina Blanch MD [facility]  If 7PM-7AM, please contact night-coverage [email or web address]  04/17/2024, 12:53 AM

## 2024-04-17 ENCOUNTER — Emergency Department (HOSPITAL_COMMUNITY): Payer: Self-pay

## 2024-04-17 ENCOUNTER — Encounter (HOSPITAL_COMMUNITY): Payer: Self-pay | Admitting: Internal Medicine

## 2024-04-17 DIAGNOSIS — B181 Chronic viral hepatitis B without delta-agent: Secondary | ICD-10-CM

## 2024-04-17 DIAGNOSIS — D638 Anemia in other chronic diseases classified elsewhere: Secondary | ICD-10-CM | POA: Diagnosis present

## 2024-04-17 DIAGNOSIS — C220 Liver cell carcinoma: Secondary | ICD-10-CM

## 2024-04-17 DIAGNOSIS — Z7189 Other specified counseling: Secondary | ICD-10-CM

## 2024-04-17 DIAGNOSIS — Z79899 Other long term (current) drug therapy: Secondary | ICD-10-CM

## 2024-04-17 DIAGNOSIS — Z515 Encounter for palliative care: Principal | ICD-10-CM

## 2024-04-17 LAB — URINALYSIS, ROUTINE W REFLEX MICROSCOPIC
Bilirubin Urine: NEGATIVE
Glucose, UA: NEGATIVE mg/dL
Hgb urine dipstick: NEGATIVE
Ketones, ur: NEGATIVE mg/dL
Leukocytes,Ua: NEGATIVE
Nitrite: NEGATIVE
Protein, ur: NEGATIVE mg/dL
Specific Gravity, Urine: 1.018 (ref 1.005–1.030)
pH: 6 (ref 5.0–8.0)

## 2024-04-17 LAB — CBC
HCT: 21.8 % — ABNORMAL LOW (ref 36.0–46.0)
Hemoglobin: 6.9 g/dL — CL (ref 12.0–15.0)
MCH: 26.4 pg (ref 26.0–34.0)
MCHC: 31.7 g/dL (ref 30.0–36.0)
MCV: 83.5 fL (ref 80.0–100.0)
Platelets: 296 K/uL (ref 150–400)
RBC: 2.61 MIL/uL — ABNORMAL LOW (ref 3.87–5.11)
RDW: 22.4 % — ABNORMAL HIGH (ref 11.5–15.5)
WBC: 6.8 K/uL (ref 4.0–10.5)
nRBC: 0 % (ref 0.0–0.2)

## 2024-04-17 LAB — COMPREHENSIVE METABOLIC PANEL WITH GFR
ALT: 137 U/L — ABNORMAL HIGH (ref 0–44)
AST: 285 U/L — ABNORMAL HIGH (ref 15–41)
Albumin: 3.5 g/dL (ref 3.5–5.0)
Alkaline Phosphatase: 104 U/L (ref 38–126)
Anion gap: 12 (ref 5–15)
BUN: 10 mg/dL (ref 6–20)
CO2: 21 mmol/L — ABNORMAL LOW (ref 22–32)
Calcium: 9.1 mg/dL (ref 8.9–10.3)
Chloride: 104 mmol/L (ref 98–111)
Creatinine, Ser: 0.69 mg/dL (ref 0.44–1.00)
GFR, Estimated: >60 mL/min (ref >60)
Glucose, Bld: 98 mg/dL (ref 70–99)
Potassium: 3.9 mmol/L (ref 3.5–5.1)
Sodium: 137 mmol/L (ref 135–145)
Total Bilirubin: 0.8 mg/dL (ref 0.0–1.2)
Total Protein: 7.6 g/dL (ref 6.5–8.1)

## 2024-04-17 LAB — PROTIME-INR
INR: 1.4 — ABNORMAL HIGH (ref 0.8–1.2)
Prothrombin Time: 18 s — ABNORMAL HIGH (ref 11.4–15.2)

## 2024-04-17 LAB — PREPARE RBC (CROSSMATCH)

## 2024-04-17 LAB — ABO/RH: ABO/RH(D): O POS

## 2024-04-17 LAB — HEMOGLOBIN AND HEMATOCRIT, BLOOD
HCT: 24.2 % — ABNORMAL LOW (ref 36.0–46.0)
Hemoglobin: 7.8 g/dL — ABNORMAL LOW (ref 12.0–15.0)

## 2024-04-17 LAB — AMMONIA: Ammonia: 94 umol/L — ABNORMAL HIGH (ref 9–35)

## 2024-04-17 MED ORDER — CHLORHEXIDINE GLUCONATE CLOTH 2 % EX PADS
6.0000 | MEDICATED_PAD | Freq: Every day | CUTANEOUS | Status: DC
  Administered 2024-04-17 – 2024-04-18 (×2): 6 via TOPICAL

## 2024-04-17 MED ORDER — SODIUM CHLORIDE 0.9% IV SOLUTION: Freq: Once | INTRAVENOUS | Status: AC

## 2024-04-17 MED ORDER — SODIUM CHLORIDE 0.9% FLUSH
3.0000 mL | Freq: Two times a day (BID) | INTRAVENOUS | Status: DC
  Administered 2024-04-17 – 2024-04-18 (×2): 3 mL via INTRAVENOUS

## 2024-04-17 MED ORDER — ENOXAPARIN SODIUM 30 MG/0.3ML IJ SOSY: 30.0000 mg | PREFILLED_SYRINGE | INTRAMUSCULAR | Status: DC

## 2024-04-17 MED ORDER — SIMETHICONE 80 MG PO CHEW
80.0000 mg | CHEWABLE_TABLET | Freq: Four times a day (QID) | ORAL | Status: DC | PRN
Start: 2024-04-17 — End: 2024-04-18
  Administered 2024-04-17 – 2024-04-18 (×2): 80 mg via ORAL
  Filled 2024-04-17 (×2): qty 1

## 2024-04-17 MED ORDER — LACTATED RINGERS IV SOLN: INTRAVENOUS | Status: AC

## 2024-04-17 MED ORDER — ONDANSETRON HCL 4 MG/2ML IJ SOLN
4.0000 mg | Freq: Four times a day (QID) | INTRAMUSCULAR | Status: DC | PRN
Start: 2024-04-17 — End: 2024-04-18
  Administered 2024-04-17 – 2024-04-18 (×3): 4 mg via INTRAVENOUS
  Filled 2024-04-17 (×3): qty 2

## 2024-04-17 MED ORDER — SENNOSIDES-DOCUSATE SODIUM 8.6-50 MG PO TABS: 1.0000 | ORAL_TABLET | Freq: Every evening | ORAL | Status: DC | PRN

## 2024-04-17 MED ORDER — MORPHINE SULFATE (CONCENTRATE) 10 MG /0.5 ML PO SOLN: 5.0000 mg | ORAL | Status: DC | PRN

## 2024-04-17 MED ORDER — ALPRAZOLAM 0.5 MG PO TABS: 0.5000 mg | ORAL_TABLET | Freq: Every evening | ORAL | Status: DC | PRN

## 2024-04-17 MED ORDER — PANTOPRAZOLE SODIUM 40 MG PO TBEC
40.0000 mg | DELAYED_RELEASE_TABLET | Freq: Every day | ORAL | Status: DC
  Administered 2024-04-17 – 2024-04-18 (×2): 40 mg via ORAL
  Filled 2024-04-17 (×2): qty 1

## 2024-04-17 MED ORDER — MORPHINE SULFATE (PF) 2 MG/ML IV SOLN
1.0000 mg | INTRAVENOUS | Status: DC | PRN
  Administered 2024-04-17: 1 mg via INTRAVENOUS
  Filled 2024-04-17: qty 1

## 2024-04-17 MED ORDER — IOHEXOL 300 MG/ML  SOLN
100.0000 mL | Freq: Once | INTRAMUSCULAR | Status: AC | PRN
  Administered 2024-04-17: 100 mL via INTRAVENOUS

## 2024-04-17 MED ORDER — TENOFOVIR ALAFENAMIDE FUMARATE 25 MG PO TABS
25.0000 mg | ORAL_TABLET | Freq: Every day | ORAL | Status: DC
  Administered 2024-04-17 – 2024-04-18 (×2): 25 mg via ORAL
  Filled 2024-04-17 (×2): qty 1

## 2024-04-17 MED ORDER — ONDANSETRON HCL 4 MG PO TABS: 4.0000 mg | ORAL_TABLET | Freq: Four times a day (QID) | ORAL | Status: DC | PRN

## 2024-04-17 MED ORDER — SODIUM CHLORIDE 0.9% FLUSH: 10.0000 mL | INTRAVENOUS | Status: DC | PRN

## 2024-04-17 MED ORDER — MORPHINE SULFATE (PF) 2 MG/ML IV SOLN: 1.0000 mg | INTRAVENOUS | Status: DC | PRN

## 2024-04-17 MED ORDER — ENOXAPARIN SODIUM 40 MG/0.4ML IJ SOSY
40.0000 mg | PREFILLED_SYRINGE | INTRAMUSCULAR | Status: DC
  Administered 2024-04-17 – 2024-04-18 (×2): 40 mg via SUBCUTANEOUS
  Filled 2024-04-17 (×2): qty 0.4

## 2024-04-17 MED ORDER — BISACODYL 5 MG PO TBEC: 5.0000 mg | DELAYED_RELEASE_TABLET | Freq: Every day | ORAL | Status: DC | PRN

## 2024-04-17 MED ORDER — TRAMADOL HCL 50 MG PO TABS
50.0000 mg | ORAL_TABLET | Freq: Three times a day (TID) | ORAL | Status: DC | PRN
  Administered 2024-04-17: 50 mg via ORAL
  Filled 2024-04-17: qty 1

## 2024-04-17 MED ORDER — LIDOCAINE-PRILOCAINE 2.5-2.5 % EX CREA
TOPICAL_CREAM | Freq: Once | CUTANEOUS | Status: AC
  Filled 2024-04-17: qty 5

## 2024-04-17 NOTE — Consult Note (Signed)
 Consultation Note Date: 04/17/2024   Patient Name: Christina Gutierrez  DOB: [birth year]  MRN: [id number]  Age / Sex: 48 y.o., female   PCP: [name], MD Referring Physician: Mcarthur Pick, MD  Reason for Consultation: Symptom management and  palliative radiation     Chief Complaint/History of Present Illness:   Patient is a Spanish-speaking 48 year old female with a past medical history of stage IIIa multifocal hepatocellular carcinoma, anemia of chronic disease, chronic cancer pain, and chronic hepatitis B who was admitted on 04/16/2024 for management of abdominal pain.  Since admission, imaging has shown consistent changes with known multifocal hepatocellular carcinoma.  Patient not noted to have small bowel obstruction and splenorenal shunt stable.  Patient receiving outpatient cancer directed therapy through Dr. Autumn.  Patient was going to be previously scheduled for outpatient radiation though had wanted to hold off on this as was feeling better.  Palliative medicine team consulted for symptom management palliative radiation.  Reviewed EMR including recent documentation from hospitalist, radiation oncology, and oncology.  Reviewed recent CMP noting GFR estimated over 60.  Patient's total bilirubin noted to be within normal range at 0.8.  Review of CBC noted patient's hemoglobin low at 6.9.  Patient receiving transfusion to assist with management.  Personally reviewed recent CT abdomen pelvis with contrast noting multifocal involvement of liver from known hepatocellular carcinoma.  Personally reviewed EKG obtained on 04/16/2024 noting QTc 464 to assist with medication management At time of EMR review of past 24 hours patient has received as needed tramadol  50 mg x 1 dose.  When presenting to bedside, hospitalist exiting room.  Discussed care with hospitalist to coordinate medical planning moving forward.  Noted patient's pain was improved and hoping patient can be discharged  tomorrow.  Presented to bedside to meet with patient.  Patient is Spanish-speaking and refuses use of interpreter.  Multiple family members at bedside to assist with translation as per patient's request.  Introduced myself as a member of the palliative medicine team my role in patient's medical care.  Patient able to express pain better controlled at this time.  Family members at bedside agreeing with this.  Patient hoping to eat some breakfast.  Discussed referral to outpatient palliative at [facility] to continue discussions and symptom management as needed moving forward.  Patient and family agreeing with this referral.  Spent time answering questions as able.  Noted palliative medicine team would be available if needed moving forward while inpatient and would plan for outpatient follow-up.  Primary Diagnoses  Present on Admission:  Cancer associated pain  Hepatocellular carcinoma (HCC)  Anemia of chronic disease  Chronic viral hepatitis B without delta-agent (HCC)  Abdominal pain   Palliative Review of Systems: Abdominal pain improved  Past Medical History:  Diagnosis Date   Cancer (HCC)    Hepatocellular carcinoma (HCC) 08/22/2023   Metabolic acidosis 08/21/2023   Social History   Socioeconomic History   Marital status: Married    Spouse name: Not on file   Number of children: Not on file   Years of education: Not on file   Highest education level: Not on file  Occupational History   Not on file  Tobacco Use   Smoking status: Never   Smokeless tobacco: Never  Vaping Use   Vaping status: Never Used  Substance and Sexual Activity   Alcohol use: No   Drug use: No   Sexual activity: Not Currently  Other Topics Concern   Not on file  Social History Narrative  Not on file   Social Drivers of Health   Financial Resource Strain: Not on file  Food Insecurity: No Food Insecurity (04/17/2024)   Hunger Vital Sign    Worried About Running Out of Food in the Last Year: Never true     Ran Out of Food in the Last Year: Never true  Transportation Needs: No Transportation Needs (04/17/2024)   PRAPARE - Administrator, Civil Service (Medical): No    Lack of Transportation (Non-Medical): No  Physical Activity: Not on file  Stress: Not on file  Social Connections: Patient Declined (08/29/2023)   Social Connection and Isolation Panel    Frequency of Communication with Friends and Family: Patient declined    Frequency of Social Gatherings with Friends and Family: Patient declined    Attends Religious Services: Patient declined    Database administrator or Organizations: Patient declined    Attends Banker Meetings: Patient declined    Marital Status: Patient declined   History reviewed. No pertinent family history. Scheduled Meds:  sodium chloride    Intravenous Once   enoxaparin (LOVENOX) injection  30 mg Subcutaneous Q24H   pantoprazole   40 mg Oral Q0600   sodium chloride  flush  3 mL Intravenous Q12H   tenofovir  alafenamide  25 mg Oral Daily   Continuous Infusions:  lactated ringers  100 mL/hr at 04/17/24 0157   PRN Meds:.ALPRAZolam , bisacodyl, morphine  injection, morphine  CONCENTRATE, ondansetron  **OR** ondansetron  (ZOFRAN ) IV, senna-docusate, traMADol  No Known Allergies CBC:    Component Value Date/Time   WBC 6.8 04/17/2024 0451   HGB 6.9 (LL) 04/17/2024 0451   HGB 7.2 (L) 04/08/2024 0740   HCT 21.8 (L) 04/17/2024 0451   PLT 296 04/17/2024 0451   PLT 263 04/08/2024 0740   MCV 83.5 04/17/2024 0451   NEUTROABS 3.2 04/08/2024 0740   LYMPHSABS 1.4 04/08/2024 0740   MONOABS 0.5 04/08/2024 0740   EOSABS 0.7 (H) 04/08/2024 0740   BASOSABS 0.2 (H) 04/08/2024 0740   Comprehensive Metabolic Panel:    Component Value Date/Time   NA 137 04/17/2024 0451   K 3.9 04/17/2024 0451   CL 104 04/17/2024 0451   CO2 21 (L) 04/17/2024 0451   BUN 10 04/17/2024 0451   CREATININE 0.69 04/17/2024 0451   CREATININE 0.60 04/08/2024 0740   GLUCOSE 98  04/17/2024 0451   CALCIUM 9.1 04/17/2024 0451   AST 285 (H) 04/17/2024 0451   AST 219 (HH) 04/08/2024 0740   ALT 137 (H) 04/17/2024 0451   ALT 104 (H) 04/08/2024 0740   ALKPHOS 104 04/17/2024 0451   BILITOT 0.8 04/17/2024 0451   BILITOT 0.7 04/08/2024 0740   PROT 7.6 04/17/2024 0451   ALBUMIN 3.5 04/17/2024 0451    Physical Exam: Vital Signs: BP 109/68   Pulse 80   Temp 98.1 F (36.7 C) (Oral)   Resp 16   Ht 5' (1.524 m)   Wt 48.1 kg   SpO2 98%   BMI 20.70 kg/m  SpO2: SpO2: 98 % O2 Device: O2 Device: Room Air O2 Flow Rate:   Intake/output summary: No intake or output data in the 24 hours ending 04/17/24 0817 LBM:   Baseline Weight: Weight: 48.1 kg Most recent weight: Weight: 48.1 kg  General: NAD, alert, pleasant, chronically ill-appearing, frail Cardiovascular: RRR Respiratory: no increased work of breathing noted, not in respiratory distress Neuro: A&Ox4, following commands easily Psych: appropriately answers all questions          Palliative Performance Scale: 60%  Additional Data Reviewed: Recent Labs    04/16/24 2039 04/17/24 0451  WBC 8.0 6.8  HGB 7.7* 6.9*  PLT 342 296  NA 136 137  BUN 9 10  CREATININE 0.63 0.69    Imaging: CT ABDOMEN PELVIS W CONTRAST CLINICAL DATA:  Epigastric pain  EXAM: CT ABDOMEN AND PELVIS WITH CONTRAST  TECHNIQUE: Multidetector CT imaging of the abdomen and pelvis was performed using the standard protocol following bolus administration of intravenous contrast.  RADIATION DOSE REDUCTION: This exam was performed according to the departmental dose-optimization program which includes automated exposure control, adjustment of the mA and/or kV according to patient size and/or use of iterative reconstruction technique.  CONTRAST:  OMNIPAQUE  IOHEXOL  300 MG/ML  SOLN  COMPARISON:  03/06/2024  FINDINGS: Lower chest: No acute abnormality.  Hepatobiliary: Liver again demonstrates multiple heterogeneous  mass lesions similar to that seen on the most recent CT examination consistent with the given clinical history of multifocal hepatocellular carcinoma. The largest of these measures 16.4 cm in greatest dimension. This causes significant mass effect upon the hepatic hilar vasculature as well as the IVC. These changes are stable in appearance. Gallbladder is partially distended with evidence of cholelithiasis. No complicating factors are seen.  Pancreas: Unremarkable. No pancreatic ductal dilatation or surrounding inflammatory changes.  Spleen: Normal in size without focal abnormality.  Adrenals/Urinary Tract: Adrenal glands are within normal limits. Kidneys demonstrate a normal enhancement pattern. No renal calculi or obstructive changes are seen. Stable right renal cyst is noted. No follow-up is recommended. Bladder is partially distended.  Stomach/Bowel: No obstructive or inflammatory changes of the colon are seen. The appendix is not well visualized. Small bowel and stomach are within normal limits. No inflammatory changes to suggest appendicitis are noted.  Vascular/Lymphatic: Aortic atherosclerosis. No enlarged abdominal or pelvic lymph nodes. 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  Reproductive: Uterus and bilateral adnexa are unremarkable.  Other: No free fluid is noted within the pelvis.  Musculoskeletal: No acute or significant osseous findings.  IMPRESSION: Changes consistent with the known history of multifocal hepatocellular carcinoma.  Previously seen small bowel obstruction has resolved.  Splenorenal shunt stable from the prior study.  Cholelithiasis without complicating factors.  Electronically Signed   By: Oneil Devonshire M.D.   On: 04/17/2024 00:29    I personally reviewed recent imaging.   Palliative Care Assessment and Plan Summary of Established Goals of Care and Medical Treatment Preferences   Patient is  a Spanish-speaking 48 year old female with a past medical history of stage IIIa multifocal hepatocellular carcinoma, anemia of chronic disease, chronic cancer pain, and chronic hepatitis B who was admitted on 04/16/2024 for management of abdominal pain.  Since admission, imaging has shown consistent changes with known multifocal hepatocellular carcinoma.  Patient not noted to have small bowel obstruction and splenorenal shunt stable.  Patient receiving outpatient cancer directed therapy through Dr. Autumn.  Patient was going to be previously scheduled for outpatient radiation though had wanted to hold off on this as was feeling better.  Palliative medicine team consulted for symptom management palliative radiation.  # Complex medical decision making/goals of care  - Patient continuing discussions with medical providers including oncologist and radiation oncologist in the outpatient setting regarding cancer directed therapies moving forward.  Dr. Dewey and his team can assist with coordination of palliative radiation in the outpatient setting as appropriate.  -  Code Status: Full Code   # Symptom management Patient is receiving these palliative interventions  for symptom management with an intent to improve quality of life.   -Pain, acute on chronic in setting of stage IIIa hepatocellular carcinoma   - Discontinue tramadol  due to associated side effects from medications and interactions can have with other medications   - Continue as needed morphine  solution 5-20 mg every 4 hours  # Psycho-social/Spiritual Support:  - Support System: Significant other, children  # Discharge Planning:  Home with Palliative Services - Placed referral [facility] at [facility]  Thank you for allowing the palliative care team to participate in the care Alfrieda Claiborne Agreste.  Tinnie Radar, DO Palliative Care Provider PMT # [phone]  If patient remains symptomatic despite maximum doses, please call PMT at  [phone] between 0700 and 1900. Outside of these hours, please call attending, as PMT does not have night coverage.  Billing based on MDM: High   Problems Addressed: One or more chronic illnesses with severe exacerbation, progression, or side effects of treatment.  Amount and/or Complexity of Data: Category 1:Review of prior external note(s) from each unique source, Review of the result(s) of each unique test, and Assessment requiring an independent historian(s), Category 2:Independent interpretation of a test performed by another physician/other qualified health care professional (not separately reported), and Category 3:Discussion of management or test interpretation with external physician/other qualified health care professional/appropriate source (not separately reported)

## 2024-04-17 NOTE — Significant Event (Signed)
       CROSS COVER NOTE  NAME: Christina Gutierrez MRN: [id number] DOB : 1975/10/13 ATTENDING PHYSICIAN: Mcarthur Pick, MD    Date of Service   04/17/2024   HPI/Events of Note   Message received from nurse. HGB drop 5.9   Interventions   Assessment/Plan:    04/17/2024    5:25 AM 04/17/2024    1:41 AM 04/16/2024   10:15 PM  Vitals with BMI  Systolic 109 101 896  Diastolic 68 52 60  Pulse 80 77 75    Use of language line interpreter discussion with patient and husband risk benefits of transfusion.  Has had transfuion in past without reaction.  Denies evidence of bleeding from other sites other than gums T & C  transfuse 1 unit      Erminio LITTIE Cone NP [facility] Cross Cover 7pm-7am - check amion for availability Pager [phone] HPI and labs reviewed

## 2024-04-17 NOTE — Progress Notes (Signed)
 PROGRESS NOTE    Christina Gutierrez  FMW:[id number] DOB: 12-20-1975 DOA: 04/16/2024 PCP: [name], MD   Brief Narrative:   Christina Gutierrez is a Spanish-speaking 48 y.o. female with medical history significant for stage IIIa multifocal hepatocellular carcinoma, anemia of chronic disease, chronic cancer associated pain, chronic hepatitis B who presented to the ED for evaluation of abdominal pain.   Patient follows with oncology for management of multifocal hepatocellular carcinoma.  She previously underwent systemic treatment with atezolizumab  plus bevacizumab , last treatment 02/26/2024.  She has been having progressive abdominal pain, fatigue, failure to thrive.  She was referred to radiation oncology for palliative radiation and also IR guided ablation.  Seen by Dr. Dewey on 10/7 and was going to be scheduled for simulation last week but patient wanted to hold off to see if she was feeling a bit better.  CT abdomen/pelvis with contrast show changes consistent with known multifocal hepatocellular carcinoma.  Previously seen small bowel obstruction has resolved.  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  Cholelithiasis without complicating factors also noted.   Patient was given 1 L normal saline, IV Zofran , IV fentanyl .  The hospitalist service was consulted for admission.    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  CT A/P does not show evidence of bowel obstruction or acute changes. - Continue oral morphine  solution as needed - IV morphine  for severe breakthrough pain - Consult palliative medicine - Continue Xanax  0.5 mg nightly as needed for anxiety   Stage  IIIa multifocal hepatocellular carcinoma: CT shows known multifocal hepatocellular carcinoma.  Patient follows with oncology Dr. Autumn and is currently off treatment.  She has been referred to radiation oncology for palliative radiation consideration.   Acute on chronic anemia: Drop in hemoglobin to 6.9 overnight, she is receiving 1 unit PRBC transfusion.  Will follow-up hemoglobin level daily.  No evidence of bleeding. Hemoglobin appears to be between 7.2-8.5.  chronic hepatitis B: Continue Vemlidy .  Patient needs follow-up with ID.   DVT prophylaxis: enoxaparin (LOVENOX) injection 30 mg Start: 04/17/24 1000 Code Status: Full code, discussed with patient and family on admission Family Communication: Spouse and 2 sons at bedside Disposition Plan: From home, likely discharge home tomorrow if pain improved.    Consults called: None Severity of Illness: The appropriate patient status for this patient is OBSERVATION. Observation status is judged to be reasonable and necessary in order to provide the required intensity of service to ensure the patient's safety. The patient's presenting symptoms, physical exam findings, and initial radiographic and laboratory data in the context of their medical condition is felt to place them at decreased risk for further clinical deterioration. Furthermore, it is anticipated that the patient will be medically stable for discharge from the hospital within 2 midnights of admission.      Subjective: Patient seen and examined at the bedside with several family members present.  Patient reports some improvement in abdominal pain.  Overall she feels better than yesterday.  Tolerating p.o.  Hemoglobin dropped to 6.9 overnight for which she is receiving 1 unit PRBC transfusion.  Has had no bowel movements.  Objective: Vitals:   04/17/24 0525 04/17/24 0852 04/17/24 0921 04/17/24 0922  BP: 109/68 111/77 115/79  115/79  Pulse: 80 79 80 80  Resp:  15 16 16   Temp: 98.1 F  (36.7 C) 97.8 F (36.6 C) 98.1 F (36.7 C) 98.1 F (36.7 C)  TempSrc: Oral Oral Oral Oral  SpO2:  99%  98%  Weight:      Height:        Intake/Output Summary (Last 24 hours) at 04/17/2024 0940 Last data filed at 04/17/2024 9147 Gross per 24 hour  Intake 250 ml  Output --  Net 250 ml   Filed Weights   04/16/24 2031  Weight: 48.1 kg    Examination:  General exam: Chronically ill-appearing, not in acute distress respiratory system: Bilateral decreased breath sounds at bases Cardiovascular system: S1 & S2 heard, Rate controlled Gastrointestinal system: Abdomen is soft with mild tenderness in upper abdomen. Central nervous system: Alert and oriented. No focal neurological deficits. Moving extremities Skin: No rashes, lesions or ulcers Psychiatry: Judgement and insight appear normal. Mood & affect appropriate.     Data Reviewed: I have personally reviewed following labs and imaging studies  CBC: Recent Labs  Lab 04/11/24 1900 04/16/24 2039 04/17/24 0451  WBC 6.3 8.0 6.8  HGB 7.1* 7.7* 6.9*  HCT 21.9* 23.9* 21.8*  MCV 80.5 81.0 83.5  PLT 299 342 296   Basic Metabolic Panel: Recent Labs  Lab 04/11/24 1900 04/16/24 2039 04/17/24 0451  NA 132* 136 137  K 4.5 4.4 3.9  CL 99 102 104  CO2 21* 21* 21*  GLUCOSE 84 101* 98  BUN 7 9 10   CREATININE 0.68 0.63 0.69  CALCIUM 9.3 9.8 9.1   GFR: Estimated Creatinine Clearance: 63.1 mL/min (by C-G formula based on SCr of 0.69 mg/dL). Liver Function Tests: Recent Labs  Lab 04/16/24 2039 04/17/24 0451  AST 315* 285*  ALT 163* 137*  ALKPHOS 111 104  BILITOT 0.9 0.8  PROT 8.8* 7.6  ALBUMIN 3.9 3.5   Recent Labs  Lab 04/16/24 2039  LIPASE 44   Recent Labs  Lab 04/17/24 0031  AMMONIA 94*   Coagulation Profile: Recent Labs  Lab 04/16/24 0032  INR 1.4*   Cardiac Enzymes: No results for input(s): CKTOTAL, CKMB, CKMBINDEX, TROPONINI in the last 168 hours. BNP (last 3 results) No results for  input(s): PROBNP in the last 8760 hours. HbA1C: No results for input(s): HGBA1C in the last 72 hours. CBG: No results for input(s): GLUCAP in the last 168 hours. Lipid Profile: No results for input(s): CHOL, HDL, LDLCALC, TRIG, CHOLHDL, LDLDIRECT in the last 72 hours. Thyroid  Function Tests: No results for input(s): TSH, T4TOTAL, FREET4, T3FREE, THYROIDAB in the last 72 hours. Anemia Panel: No results for input(s): VITAMINB12, FOLATE, FERRITIN, TIBC, IRON, RETICCTPCT in the last 72 hours. Sepsis Labs: No results for input(s): PROCALCITON, LATICACIDVEN in the last 168 hours.  No results found for this or any previous visit (from the past 240 hours).       Radiology Studies: CT ABDOMEN PELVIS W CONTRAST Result Date: 04/17/2024 CLINICAL DATA:  Epigastric pain EXAM: CT ABDOMEN AND PELVIS WITH CONTRAST TECHNIQUE: Multidetector CT imaging of the abdomen and pelvis was performed using the standard protocol following bolus administration of intravenous contrast. RADIATION DOSE REDUCTION: This exam was performed according to the departmental dose-optimization program which includes automated exposure control, adjustment of the mA and/or kV according to patient size and/or use of iterative reconstruction technique. CONTRAST:  OMNIPAQUE  IOHEXOL  300 MG/ML  SOLN COMPARISON:  03/06/2024 FINDINGS: Lower chest: No acute abnormality. Hepatobiliary: Liver again demonstrates  multiple heterogeneous mass lesions similar to that seen on the most recent CT examination consistent with the given clinical history of multifocal hepatocellular carcinoma. The largest of these measures 16.4 cm in greatest dimension. This causes significant mass effect upon the hepatic hilar vasculature as well as the IVC. These changes are stable in appearance. Gallbladder is partially distended with evidence of cholelithiasis. No complicating factors are seen. Pancreas: Unremarkable. No  pancreatic ductal dilatation or surrounding inflammatory changes. Spleen: Normal in size without focal abnormality. Adrenals/Urinary Tract: Adrenal glands are within normal limits. Kidneys demonstrate a normal enhancement pattern. No renal calculi or obstructive changes are seen. Stable right renal cyst is noted. No follow-up is recommended. Bladder is partially distended. Stomach/Bowel: No obstructive or inflammatory changes of the colon are seen. The appendix is not well visualized. Small bowel and stomach are within normal limits. No inflammatory changes to suggest appendicitis are noted. Vascular/Lymphatic: Aortic atherosclerosis. No enlarged abdominal or pelvic lymph nodes. 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 Reproductive: Uterus and bilateral adnexa are unremarkable. Other: No free fluid is noted within the pelvis. Musculoskeletal: No acute or significant osseous findings. IMPRESSION: Changes consistent with the known history of multifocal hepatocellular carcinoma. Previously seen small bowel obstruction has resolved. Splenorenal shunt stable from the prior study. Cholelithiasis without complicating factors. Electronically Signed   By: Oneil Devonshire M.D.   On: 04/17/2024 00:29   DG Chest 2 View Result Date: 04/16/2024 CLINICAL DATA:  Shortness of breath. Receiving chemotherapy for liver cancer. EXAM: CHEST - 2 VIEW COMPARISON:  January 15, 2013 FINDINGS: A right-sided venous Port-A-Cath is seen with its distal tip noted at the junction of the superior vena cava and right atrium. The heart size and mediastinal contours are within normal limits. Low lung volumes are noted. Mild atelectasis is suspected within the bilateral lung bases. No acute infiltrate, pleural effusion or pneumothorax is identified. The visualized skeletal structures are unremarkable. IMPRESSION: Low lung volumes with mild bibasilar atelectasis. Electronically Signed   By: Suzen Dials M.D.    On: 04/16/2024 21:05        Scheduled Meds:  Chlorhexidine Gluconate Cloth  6 each Topical Daily   enoxaparin (LOVENOX) injection  40 mg Subcutaneous Q24H   pantoprazole   40 mg Oral Q0600   sodium chloride  flush  3 mL Intravenous Q12H   tenofovir  alafenamide  25 mg Oral Daily   Continuous Infusions:  lactated ringers  100 mL/hr at 04/17/24 0157          Derryl Duval, MD [facility] 04/17/2024, 9:40 AM

## 2024-04-17 NOTE — Plan of Care (Signed)

## 2024-04-17 NOTE — Hospital Course (Signed)
 Christina Gutierrez is a Spanish-speaking 48 y.o. female with medical history significant for stage IIIa multifocal hepatocellular carcinoma, anemia of chronic disease, chronic cancer associated pain, chronic hepatitis B who is admitted with uncontrolled cancer associate abdominal pain.

## 2024-04-18 ENCOUNTER — Encounter: Payer: Self-pay | Admitting: Oncology

## 2024-04-18 ENCOUNTER — Encounter (HOSPITAL_COMMUNITY): Payer: Self-pay | Admitting: Oncology

## 2024-04-18 ENCOUNTER — Ambulatory Visit: Payer: Self-pay

## 2024-04-18 ENCOUNTER — Other Ambulatory Visit: Payer: Self-pay

## 2024-04-18 ENCOUNTER — Other Ambulatory Visit (HOSPITAL_COMMUNITY): Payer: Self-pay

## 2024-04-18 LAB — BPAM RBC
Blood Product Expiration Date: 202511082359
ISSUE DATE / TIME: 202510120855
Unit Type and Rh: 5100

## 2024-04-18 LAB — TYPE AND SCREEN
ABO/RH(D): O POS
Antibody Screen: NEGATIVE
Unit division: 0

## 2024-04-18 MED ORDER — OXYCODONE HCL 5 MG/5ML PO SOLN
5.0000 mg | ORAL | 0 refills | Status: DC | PRN
  Filled 2024-04-18: qty 100, 4d supply, fill #0

## 2024-04-18 MED ORDER — ONDANSETRON HCL 4 MG/2ML IJ SOLN: 8.0000 mg | Freq: Once | INTRAMUSCULAR | Status: DC

## 2024-04-18 MED ORDER — SENNOSIDES-DOCUSATE SODIUM 8.6-50 MG PO TABS
1.0000 | ORAL_TABLET | Freq: Two times a day (BID) | ORAL | 0 refills | Status: DC
  Filled 2024-04-18: qty 60, 30d supply, fill #0

## 2024-04-18 MED ORDER — SENNOSIDES-DOCUSATE SODIUM 8.6-50 MG PO TABS
1.0000 | ORAL_TABLET | Freq: Two times a day (BID) | ORAL | Status: DC
  Administered 2024-04-18: 1 via ORAL
  Filled 2024-04-18: qty 1

## 2024-04-18 MED ORDER — ALPRAZOLAM 0.5 MG PO TABS
0.5000 mg | ORAL_TABLET | Freq: Every evening | ORAL | 0 refills | Status: DC | PRN
  Filled 2024-04-18: qty 30, 30d supply, fill #0

## 2024-04-18 MED ORDER — HEPARIN SOD (PORK) LOCK FLUSH 100 UNIT/ML IV SOLN
500.0000 [IU] | INTRAVENOUS | Status: AC | PRN
  Administered 2024-04-18: 500 [IU]
  Filled 2024-04-18: qty 5

## 2024-04-18 MED ORDER — POLYETHYLENE GLYCOL 3350 17 GM/SCOOP PO POWD
17.0000 g | Freq: Every day | ORAL | 0 refills | Status: DC
  Filled 2024-04-18: qty 238, 14d supply, fill #0

## 2024-04-18 MED ORDER — SODIUM CHLORIDE 0.9 % IV BOLUS
1000.0000 mL | Freq: Once | INTRAVENOUS | Status: DC
  Filled 2024-04-18: qty 1000

## 2024-04-18 MED ORDER — POLYETHYLENE GLYCOL 3350 17 G PO PACK
17.0000 g | PACK | Freq: Every day | ORAL | Status: DC
  Administered 2024-04-18: 17 g via ORAL
  Filled 2024-04-18: qty 1

## 2024-04-18 MED ORDER — MORPHINE SULFATE (CONCENTRATE) 10 MG /0.5 ML PO SOLN
10.0000 mg | ORAL | Status: DC | PRN
  Administered 2024-04-18: 10 mg via ORAL
  Filled 2024-04-18 (×2): qty 0.5

## 2024-04-18 MED ORDER — OXYCODONE HCL 5 MG/5ML PO SOLN
5.0000 mg | ORAL | Status: DC | PRN
  Administered 2024-04-18: 5 mg via ORAL
  Filled 2024-04-18: qty 5

## 2024-04-18 NOTE — Discharge Summary (Signed)
 Physician Discharge Summary  Christina Gutierrez FMW:[id number] DOB: 02-25-1976 DOA: 04/16/2024  PCP: [name], MD  Admit date: 04/16/2024 Discharge date: 04/18/2024  Admitted From: Home Disposition: Home  Recommendations for Outpatient Follow-up:  Follow up with PCP in 1-2 weeks Follow-up at cancer center  Home Health: N/A Equipment/Devices: N/A  Discharge Condition: Stable CODE STATUS: Full code Diet recommendation: Regular diet, nutritional supplements  Discharge summary: 48 year old Spanish-speaking with history of stage IIIa multifocal hepatocellular carcinoma, anemia of chronic disease, cancer associated pain, chronic hepatitis B presented to the ER with right upper quadrant abdominal pain.  She previously underwent systemic chemotherapy.  Has progressive abdominal pain and fatigue.  In the emergency room CT scan abdomen pelvis with contrast consistent with known multifocal hepatocellular carcinoma without any complications, no bowel obstruction.  Admitted for symptom management.  Cancer related pain: Patient was prescribed morphine , she was getting dizzy lightheaded with morphine  so was not taking it.  She was given a trial of oxycodone  and had good symptom control.  Will prescribe oxycodone  to go home along with bowel regimen including stool softener and laxatives.  Patient will benefit with ongoing pain management from cancer center to avoid need for hospitalization. Medically stable for discharge with outpatient follow-up.    Discharge Diagnoses:  Principal Problem:   Cancer associated pain Active Problems:   Chronic viral hepatitis B without delta-agent (HCC)   Hepatocellular carcinoma (HCC)   Abdominal pain   Anemia of chronic disease   Palliative care encounter   Medication management   Counseling and coordination of care   Goals of care, counseling/discussion    Discharge Instructions  Discharge Instructions     Amb Referral to Palliative Care    Complete by: As directed    Diet general   Complete by: As directed    Increase activity slowly   Complete by: As directed       Allergies as of 04/18/2024   No Known Allergies      Medication List     STOP taking these medications    morphine  20 MG/5ML solution       TAKE these medications    ALPRAZolam  0.5 MG tablet Commonly known as: XANAX  Take 1 tablet (0.5 mg total) by mouth at bedtime as needed for anxiety.   lidocaine -prilocaine  cream Commonly known as: EMLA  Apply 1 Application topically as needed.   ondansetron  8 MG tablet Commonly known as: ZOFRAN  Take 1 tablet (8 mg total) by mouth every 8 (eight) hours as needed for nausea or vomiting.   oxyCODONE  5 MG/5ML solution Commonly known as: ROXICODONE  Take 5 mLs (5 mg total) by mouth every 4 (four) hours as needed for up to 7 days for moderate pain (pain score 4-6) or severe pain (pain score 7-10).   pantoprazole  40 MG tablet Commonly known as: PROTONIX  Take 1 tablet (40 mg total) by mouth daily at 6 (six) AM.   polyethylene glycol 17 g packet Commonly known as: MIRALAX  / GLYCOLAX  Take 17 g by mouth daily. Start taking on: April 19, 2024   prochlorperazine  10 MG tablet Commonly known as: COMPAZINE  Take 1 tablet (10 mg total) by mouth every 6 (six) hours as needed for nausea or vomiting.   senna-docusate 8.6-50 MG tablet Commonly known as: Senokot-S Take 1 tablet by mouth 2 (two) times daily.   Vemlidy  25 MG tablet Generic drug: tenofovir  alafenamide Tome 1 tableta (25 mg en total) por va oral diariamente. (Take 1 tablet (25 mg total) by mouth daily.)  No Known Allergies  Consultations: Palliative care   Procedures/Studies: CT ABDOMEN PELVIS W CONTRAST Result Date: 04/17/2024 CLINICAL DATA:  Epigastric pain EXAM: CT ABDOMEN AND PELVIS WITH CONTRAST TECHNIQUE: Multidetector CT imaging of the abdomen and pelvis was performed using the standard protocol following bolus  administration of intravenous contrast. RADIATION DOSE REDUCTION: This exam was performed according to the departmental dose-optimization program which includes automated exposure control, adjustment of the mA and/or kV according to patient size and/or use of iterative reconstruction technique. CONTRAST:  OMNIPAQUE  IOHEXOL  300 MG/ML  SOLN COMPARISON:  03/06/2024 FINDINGS: Lower chest: No acute abnormality. Hepatobiliary: Liver again demonstrates multiple heterogeneous mass lesions similar to that seen on the most recent CT examination consistent with the given clinical history of multifocal hepatocellular carcinoma. The largest of these measures 16.4 cm in greatest dimension. This causes significant mass effect upon the hepatic hilar vasculature as well as the IVC. These changes are stable in appearance. Gallbladder is partially distended with evidence of cholelithiasis. No complicating factors are seen. Pancreas: Unremarkable. No pancreatic ductal dilatation or surrounding inflammatory changes. Spleen: Normal in size without focal abnormality. Adrenals/Urinary Tract: Adrenal glands are within normal limits. Kidneys demonstrate a normal enhancement pattern. No renal calculi or obstructive changes are seen. Stable right renal cyst is noted. No follow-up is recommended. Bladder is partially distended. Stomach/Bowel: No obstructive or inflammatory changes of the colon are seen. The appendix is not well visualized. Small bowel and stomach are within normal limits. No inflammatory changes to suggest appendicitis are noted. Vascular/Lymphatic: Aortic atherosclerosis. No enlarged abdominal or pelvic lymph nodes. 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 Reproductive: Uterus and bilateral adnexa are unremarkable. Other: No free fluid is noted within the pelvis. Musculoskeletal: No acute or significant osseous findings. IMPRESSION: Changes consistent with the known history of  multifocal hepatocellular carcinoma. Previously seen small bowel obstruction has resolved. Splenorenal shunt stable from the prior study. Cholelithiasis without complicating factors. Electronically Signed   By: Oneil Devonshire M.D.   On: 04/17/2024 00:29   DG Chest 2 View Result Date: 04/16/2024 CLINICAL DATA:  Shortness of breath. Receiving chemotherapy for liver cancer. EXAM: CHEST - 2 VIEW COMPARISON:  January 15, 2013 FINDINGS: A right-sided venous Port-A-Cath is seen with its distal tip noted at the junction of the superior vena cava and right atrium. The heart size and mediastinal contours are within normal limits. Low lung volumes are noted. Mild atelectasis is suspected within the bilateral lung bases. No acute infiltrate, pleural effusion or pneumothorax is identified. The visualized skeletal structures are unremarkable. IMPRESSION: Low lung volumes with mild bibasilar atelectasis. Electronically Signed   By: Suzen Dials M.D.   On: 04/16/2024 21:05   CT Head Wo Contrast Result Date: 04/11/2024 CLINICAL DATA:  Brain metastases suspected Patient said she has a headache for 3 days. Unable to sleep. Has been vomiting and feels nauseous. Has liver and stomach cancer. EXAM: CT HEAD WITHOUT CONTRAST TECHNIQUE: Contiguous axial images were obtained from the base of the skull through the vertex without intravenous contrast. RADIATION DOSE REDUCTION: This exam was performed according to the departmental dose-optimization program which includes automated exposure control, adjustment of the mA and/or kV according to patient size and/or use of iterative reconstruction technique. COMPARISON:  MRI head 02/12/2024 FINDINGS: Brain: No evidence of large-territorial acute infarction. No parenchymal hemorrhage. No mass lesion. No extra-axial collection. No mass effect or midline shift. No hydrocephalus. Basilar cisterns are patent. Vascular: No hyperdense vessel. Atherosclerotic calcifications  are present within the  cavernous internal carotid arteries. Skull: No acute fracture or focal lesion. Sinuses/Orbits: Paranasal sinuses and mastoid air cells are clear. The orbits are unremarkable. Other: None. IMPRESSION: No acute intracranial abnormality. Please note MRI with and without contrast is much more sensitive for evaluation of brain metastases. Electronically Signed   By: Morgane  Naveau M.D.   On: 04/11/2024 19:28   (Echo, Carotid, EGD, Colonoscopy, ERCP)    Subjective: Patient seen and examined.  Early rounds, Spanish interpreter was used and detailed discussion was done with patient and husband at the bedside.  Patient tried to take morphine , she felt dizzy and did not like it.  She was given oxycodone  with pain control and without any dizzy episodes.  Will prescribe oxycodone .   Discharge Exam: Vitals:   04/18/24 0535 04/18/24 1215  BP: 112/70 117/78  Pulse: 69 77  Resp: 14 16  Temp: 98.2 F (36.8 C) (!) 97.3 F (36.3 C)  SpO2: 97% 94%   Vitals:   04/17/24 1445 04/17/24 2105 04/18/24 0535 04/18/24 1215  BP: 110/72 120/70 112/70 117/78  Pulse: 77 77 69 77  Resp: 16  14 16   Temp: 97.8 F (36.6 C)  98.2 F (36.8 C) (!) 97.3 F (36.3 C)  TempSrc:    Oral  SpO2: 95% 97% 97% 94%  Weight:      Height:        General: Pt is alert, awake, not in acute distress Cardiovascular: RRR, S1/S2 +, no rubs, no gallops Respiratory: CTA bilaterally, no wheezing, no rhonchi Abdominal: Soft, NT, ND, bowel sounds +, no localized tenderness or organomegaly. Extremities: no edema, no cyanosis    The results of significant diagnostics from this hospitalization (including imaging, microbiology, ancillary and laboratory) are listed below for reference.     Microbiology: No results found for this or any previous visit (from the past 240 hours).   Labs: BNP (last 3 results) No results for input(s): BNP in the last 8760 hours. Basic Metabolic Panel: Recent Labs  Lab 04/11/24 1900 04/16/24 2039  04/17/24 0451  NA 132* 136 137  K 4.5 4.4 3.9  CL 99 102 104  CO2 21* 21* 21*  GLUCOSE 84 101* 98  BUN 7 9 10   CREATININE 0.68 0.63 0.69  CALCIUM 9.3 9.8 9.1   Liver Function Tests: Recent Labs  Lab 04/16/24 2039 04/17/24 0451  AST 315* 285*  ALT 163* 137*  ALKPHOS 111 104  BILITOT 0.9 0.8  PROT 8.8* 7.6  ALBUMIN 3.9 3.5   Recent Labs  Lab 04/16/24 2039  LIPASE 44   Recent Labs  Lab 04/17/24 0031  AMMONIA 94*   CBC: Recent Labs  Lab 04/11/24 1900 04/16/24 2039 04/17/24 0451 04/17/24 1628  WBC 6.3 8.0 6.8  --   HGB 7.1* 7.7* 6.9* 7.8*  HCT 21.9* 23.9* 21.8* 24.2*  MCV 80.5 81.0 83.5  --   PLT 299 342 296  --    Cardiac Enzymes: No results for input(s): CKTOTAL, CKMB, CKMBINDEX, TROPONINI in the last 168 hours. BNP: Invalid input(s): POCBNP CBG: No results for input(s): GLUCAP in the last 168 hours. D-Dimer No results for input(s): DDIMER in the last 72 hours. Hgb A1c No results for input(s): HGBA1C in the last 72 hours. Lipid Profile No results for input(s): CHOL, HDL, LDLCALC, TRIG, CHOLHDL, LDLDIRECT in the last 72 hours. Thyroid  function studies No results for input(s): TSH, T4TOTAL, T3FREE, THYROIDAB in the last 72 hours.  Invalid input(s): FREET3 Anemia work  up No results for input(s): VITAMINB12, FOLATE, FERRITIN, TIBC, IRON, RETICCTPCT in the last 72 hours. Urinalysis    Component Value Date/Time   COLORURINE YELLOW 04/16/2024 2230   APPEARANCEUR CLEAR 04/16/2024 2230   LABSPEC 1.018 04/16/2024 2230   PHURINE 6.0 04/16/2024 2230   GLUCOSEU NEGATIVE 04/16/2024 2230   HGBUR NEGATIVE 04/16/2024 2230   BILIRUBINUR NEGATIVE 04/16/2024 2230   KETONESUR NEGATIVE 04/16/2024 2230   PROTEINUR NEGATIVE 04/16/2024 2230   NITRITE NEGATIVE 04/16/2024 2230   LEUKOCYTESUR NEGATIVE 04/16/2024 2230   Sepsis Labs Recent Labs  Lab 04/11/24 1900 04/16/24 2039 04/17/24 0451  WBC 6.3 8.0 6.8    Microbiology No results found for this or any previous visit (from the past 240 hours).   Time coordinating discharge: 28 minutes  SIGNED:   Renato Applebaum, MD  [facility] 04/18/2024, 2:50 PM

## 2024-04-18 NOTE — Plan of Care (Signed)

## 2024-04-18 NOTE — TOC Initial Note (Signed)
 Transition of Care [facility]) - Initial/Assessment Note   Patient Details  Name: Christina Gutierrez MRN: [id number] Date of Birth: Jun 25, 1976  [facility] CM/SW Contact:    Duwaine GORMAN Aran, LCSW Phone Number: 04/18/2024, 2:24 PM  Clinical Narrative: Patient is from home with spouse. Patient is uninsured and is currently getting treatment through the cancer center. Care management following for possible discharge needs.  Expected Discharge Plan: Home/Self Care Barriers to Discharge: Continued Medical Work up, Inadequate or no insurance  Patient Goals and CMS Choice Choice offered to / list presented to : NA  Expected Discharge Plan and Services In-house Referral: Clinical Social Work Post Acute Care Choice: NA Living arrangements for the past 2 months: Single Family Home           DME Arranged: N/A DME Agency: NA  Prior Living Arrangements/Services Living arrangements for the past 2 months: Single Family Home Lives with:: Spouse Patient language and need for interpreter reviewed:: Yes Do you feel safe going back to the place where you live?: Yes      Need for Family Participation in Patient Care: No (Comment) Care giver support system in place?: Yes (comment) Criminal Activity/Legal Involvement Pertinent to Current Situation/Hospitalization: No - Comment as needed  Activities of Daily Living ADL Screening (condition at time of admission) Independently performs ADLs?: No Does the patient have a NEW difficulty with bathing/dressing/toileting/self-feeding that is expected to last >3 days?: No Does the patient have a NEW difficulty with getting in/out of bed, walking, or climbing stairs that is expected to last >3 days?: No Does the patient have a NEW difficulty with communication that is expected to last >3 days?: No Is the patient deaf or have difficulty hearing?: No Does the patient have difficulty seeing, even when wearing glasses/contacts?: No Does the patient  have difficulty concentrating, remembering, or making decisions?: No  Emotional Assessment Alcohol / Substance Use: Not Applicable Psych Involvement: No (comment)  Admission diagnosis:  Epigastric pain [R10.13] Abdominal pain [R10.9] Patient Active Problem List   Diagnosis Date Noted   Anemia of chronic disease 04/17/2024   Palliative care encounter 04/17/2024   Medication management 04/17/2024   Counseling and coordination of care 04/17/2024   Goals of care, counseling/discussion 04/17/2024   Abdominal pain 03/17/2024   Dehydration 03/14/2024   Gingival bleeding 11/13/2023   Port-A-Cath in place 10/23/2023   Situational insomnia 10/02/2023   Cancer associated pain 08/28/2023   Portal venous hypertension (HCC) 08/23/2023   Intractable pain 08/23/2023   Hepatocellular carcinoma (HCC) 08/22/2023   Hepatic failure (HCC) 08/21/2023   Normocytic anemia 08/21/2023   Transaminitis 08/21/2023   Disseminated varicella 08/21/2023   Hyperammonemia 08/21/2023   Chronic viral hepatitis B without delta-agent (HCC) 08/21/2023   PCP:  [name], MD Pharmacy:   [id number] [place], [place] - [address] AT [facility] & [address] [place] [address] Phone: [phone] Fax: [phone]  [place] [facility] [address] [place] [address] Phone: [phone] Fax: [phone]  Social Drivers of Health (SDOH) Social History: SDOH Screenings   Food Insecurity: No Food Insecurity (04/17/2024)  Housing: Low Risk  (04/17/2024)  Transportation Needs: No Transportation Needs (04/17/2024)  Utilities: Not At Risk (04/17/2024)  Depression (PHQ2-9): Low Risk  (03/18/2024)  Social Connections: Patient Declined (08/29/2023)  Tobacco Use: Low Risk  (04/16/2024)   SDOH Interventions:    Readmission Risk Interventions  No data to display

## 2024-04-18 NOTE — Progress Notes (Signed)
 PROGRESS NOTE    Christina Gutierrez  FMW:[id number] DOB: [birth year] DOA: 04/16/2024 PCP: [name], MD   Brief Narrative:  Christina Gutierrez is a Spanish-speaking 48 y.o. female with medical history significant for stage IIIa multifocal hepatocellular carcinoma, anemia of chronic disease, chronic cancer associated pain, chronic hepatitis B who presented to the ED for evaluation of abdominal pain.   Patient follows with oncology for management of multifocal hepatocellular carcinoma.  She previously underwent systemic treatment with atezolizumab  plus bevacizumab , last treatment 02/26/2024.  She has been having progressive abdominal pain, fatigue, failure to thrive.  She was referred to radiation oncology for palliative radiation and also IR guided ablation.  Seen by Dr. Dewey on 10/7 and was going to be scheduled for simulation last week but patient wanted to hold off to see if she was feeling a bit better.  CT abdomen/pelvis with contrast show changes consistent with known multifocal hepatocellular carcinoma.  Previously seen small bowel obstruction has resolved.  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  Cholelithiasis without complicating factors also noted.   Patient was given 1 L normal saline, IV Zofran , IV fentanyl .  The hospitalist service was consulted for admission.  Remains with significant pain.    Assessment & Plan:   Principal Problem:   Cancer associated pain Active Problems:   Chronic viral hepatitis B without delta-agent (HCC)   Hepatocellular carcinoma (HCC)   Abdominal pain   Anemia of chronic disease   Palliative care encounter   Medication management   Counseling and coordination of care   Goals of care, counseling/discussion   Assessment and Plan: Uncontrolled cancer associated abdominal pain: Related to her known multifocal hepatocellular carcinoma.  CT A/P does not show evidence of bowel obstruction or acute changes. - Patient could not  tolerate oral morphine .  Will try oxycodone  today. - IV morphine  for severe breakthrough pain - Palliative care following. - Continue Xanax  0.5 mg nightly as needed for anxiety   Stage IIIa multifocal hepatocellular carcinoma: CT shows known multifocal hepatocellular carcinoma.  Patient follows with oncology Dr. Autumn and is currently off treatment.  She has been referred to radiation oncology for palliative radiation consideration.   Acute on chronic anemia: Hemoglobin dropped to 6.9.  Received 1 unit of PRBC.  Now is stabilized.    chronic hepatitis B: Continue Vemlidy .  Patient needs follow-up with ID.   DVT prophylaxis: enoxaparin (LOVENOX) injection 30 mg Start: 04/17/24 1000 Code Status: Full code Family Communication: Spouse at the bedside.  Disposition Plan: From home, likely discharge home tomorrow if pain improved.    Consults called: Palliative care    Subjective: Patient was seen and examined.  Spanish interpreter was used.  Husband also at the bedside.  Patient denied any nausea or vomiting.  Patient tells me she has a persistent pain.  Apparently she had not been using any of the morphine  prescribed because she gets dizzy and lightheaded after using that. Discussed about going home with oral pain medication.  Patient was anxious and she tells me that she cannot move around without having severe pain, currently rated pain 10 out of 10 with mobility. Patient was given morphine  concentrate, she complained of nausea and near vomiting. Will try oxycodone  as a liquid, if tolerates and pain control will discharge home with oxycodone . Hemoglobin is stable.  Objective: Vitals:   04/17/24 1445 04/17/24 2105 04/18/24 0535 04/18/24 1215  BP: 110/72 120/70 112/70 117/78  Pulse: 77 77 69 77  Resp: 16  14 16  Temp: 97.8 F (36.6 C)  98.2 F (36.8 C) (!) 97.3 F (36.3 C)  TempSrc:    Oral  SpO2: 95% 97% 97% 94%  Weight:      Height:        Intake/Output Summary (Last 24  hours) at 04/18/2024 1352 Last data filed at 04/17/2024 1800 Gross per 24 hour  Intake 341.6 ml  Output --  Net 341.6 ml   Filed Weights   04/16/24 2031  Weight: 48.1 kg    Examination:  General exam: Looks fairly comfortable at rest.  Apprehensive with pain during mobility. respiratory system: Bilateral decreased breath sounds at bases Cardiovascular system: S1 & S2 heard, Rate controlled Gastrointestinal system: Abdomen is soft.  No rigidity or tenderness.  No organomegaly. Central nervous system: Alert and oriented. No focal neurological deficits. Moving extremities Skin: No rashes, lesions or ulcers Psychiatry: Judgement and insight appear normal. Mood & affect appropriate.     Data Reviewed: I have personally reviewed following labs and imaging studies  CBC: Recent Labs  Lab 04/11/24 1900 04/16/24 2039 04/17/24 0451 04/17/24 1628  WBC 6.3 8.0 6.8  --   HGB 7.1* 7.7* 6.9* 7.8*  HCT 21.9* 23.9* 21.8* 24.2*  MCV 80.5 81.0 83.5  --   PLT 299 342 296  --    Basic Metabolic Panel: Recent Labs  Lab 04/11/24 1900 04/16/24 2039 04/17/24 0451  NA 132* 136 137  K 4.5 4.4 3.9  CL 99 102 104  CO2 21* 21* 21*  GLUCOSE 84 101* 98  BUN 7 9 10   CREATININE 0.68 0.63 0.69  CALCIUM 9.3 9.8 9.1   GFR: Estimated Creatinine Clearance: 63.1 mL/min (by C-G formula based on SCr of 0.69 mg/dL). Liver Function Tests: Recent Labs  Lab 04/16/24 2039 04/17/24 0451  AST 315* 285*  ALT 163* 137*  ALKPHOS 111 104  BILITOT 0.9 0.8  PROT 8.8* 7.6  ALBUMIN 3.9 3.5   Recent Labs  Lab 04/16/24 2039  LIPASE 44   Recent Labs  Lab 04/17/24 0031  AMMONIA 94*   Coagulation Profile: Recent Labs  Lab 04/16/24 0032  INR 1.4*   Cardiac Enzymes: No results for input(s): CKTOTAL, CKMB, CKMBINDEX, TROPONINI in the last 168 hours. BNP (last 3 results) No results for input(s): PROBNP in the last 8760 hours. HbA1C: No results for input(s): HGBA1C in the last 72  hours. CBG: No results for input(s): GLUCAP in the last 168 hours. Lipid Profile: No results for input(s): CHOL, HDL, LDLCALC, TRIG, CHOLHDL, LDLDIRECT in the last 72 hours. Thyroid  Function Tests: No results for input(s): TSH, T4TOTAL, FREET4, T3FREE, THYROIDAB in the last 72 hours. Anemia Panel: No results for input(s): VITAMINB12, FOLATE, FERRITIN, TIBC, IRON, RETICCTPCT in the last 72 hours. Sepsis Labs: No results for input(s): PROCALCITON, LATICACIDVEN in the last 168 hours.  No results found for this or any previous visit (from the past 240 hours).       Radiology Studies: CT ABDOMEN PELVIS W CONTRAST Result Date: 04/17/2024 CLINICAL DATA:  Epigastric pain EXAM: CT ABDOMEN AND PELVIS WITH CONTRAST TECHNIQUE: Multidetector CT imaging of the abdomen and pelvis was performed using the standard protocol following bolus administration of intravenous contrast. RADIATION DOSE REDUCTION: This exam was performed according to the departmental dose-optimization program which includes automated exposure control, adjustment of the mA and/or kV according to patient size and/or use of iterative reconstruction technique. CONTRAST:  OMNIPAQUE  IOHEXOL  300 MG/ML  SOLN COMPARISON:  03/06/2024 FINDINGS: Lower chest: No acute abnormality.  Hepatobiliary: Liver again demonstrates multiple heterogeneous mass lesions similar to that seen on the most recent CT examination consistent with the given clinical history of multifocal hepatocellular carcinoma. The largest of these measures 16.4 cm in greatest dimension. This causes significant mass effect upon the hepatic hilar vasculature as well as the IVC. These changes are stable in appearance. Gallbladder is partially distended with evidence of cholelithiasis. No complicating factors are seen. Pancreas: Unremarkable. No pancreatic ductal dilatation or surrounding inflammatory changes. Spleen: Normal in size without focal  abnormality. Adrenals/Urinary Tract: Adrenal glands are within normal limits. Kidneys demonstrate a normal enhancement pattern. No renal calculi or obstructive changes are seen. Stable right renal cyst is noted. No follow-up is recommended. Bladder is partially distended. Stomach/Bowel: No obstructive or inflammatory changes of the colon are seen. The appendix is not well visualized. Small bowel and stomach are within normal limits. No inflammatory changes to suggest appendicitis are noted. Vascular/Lymphatic: Aortic atherosclerosis. No enlarged abdominal or pelvic lymph nodes. 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 Reproductive: Uterus and bilateral adnexa are unremarkable. Other: No free fluid is noted within the pelvis. Musculoskeletal: No acute or significant osseous findings. IMPRESSION: Changes consistent with the known history of multifocal hepatocellular carcinoma. Previously seen small bowel obstruction has resolved. Splenorenal shunt stable from the prior study. Cholelithiasis without complicating factors. Electronically Signed   By: Oneil Devonshire M.D.   On: 04/17/2024 00:29   DG Chest 2 View Result Date: 04/16/2024 CLINICAL DATA:  Shortness of breath. Receiving chemotherapy for liver cancer. EXAM: CHEST - 2 VIEW COMPARISON:  January 15, 2013 FINDINGS: A right-sided venous Port-A-Cath is seen with its distal tip noted at the junction of the superior vena cava and right atrium. The heart size and mediastinal contours are within normal limits. Low lung volumes are noted. Mild atelectasis is suspected within the bilateral lung bases. No acute infiltrate, pleural effusion or pneumothorax is identified. The visualized skeletal structures are unremarkable. IMPRESSION: Low lung volumes with mild bibasilar atelectasis. Electronically Signed   By: Suzen Dials M.D.   On: 04/16/2024 21:05        Scheduled Meds:  Chlorhexidine Gluconate Cloth  6 each Topical  Daily   enoxaparin (LOVENOX) injection  40 mg Subcutaneous Q24H   pantoprazole   40 mg Oral Q0600   polyethylene glycol  17 g Oral Daily   senna-docusate  1 tablet Oral BID   sodium chloride  flush  3 mL Intravenous Q12H   tenofovir  alafenamide  25 mg Oral Daily   Continuous Infusions:          Renato Applebaum, MD [facility] 04/18/2024, 1:52 PM

## 2024-04-19 ENCOUNTER — Other Ambulatory Visit: Payer: Self-pay

## 2024-04-19 ENCOUNTER — Other Ambulatory Visit (HOSPITAL_COMMUNITY): Payer: Self-pay

## 2024-04-19 NOTE — Progress Notes (Signed)
 Specialty Pharmacy Refill Coordination Note  Christina Gutierrez is a 48 y.o. female contacted today regarding refills of specialty medication(s) Tenofovir  Alafenamide Fumarate (VEMLIDY )   Patient requested Pickup at [facility] at [place] date: 04/20/24   Medication will be filled on 04/19/24.

## 2024-04-20 ENCOUNTER — Ambulatory Visit
Admission: RE | Admit: 2024-04-20 | Discharge: 2024-04-20 | Disposition: A | Discharge status: Home / Self Care | Payer: Self-pay | Source: Ambulatory Visit | Attending: Radiation Oncology | Admitting: Radiation Oncology

## 2024-04-20 DIAGNOSIS — Z510 Encounter for antineoplastic radiation therapy: Secondary | ICD-10-CM | POA: Insufficient documentation

## 2024-04-20 DIAGNOSIS — C220 Liver cell carcinoma: Secondary | ICD-10-CM | POA: Insufficient documentation

## 2024-04-20 MED ORDER — ONDANSETRON 4 MG PO TBDP
8.0000 mg | ORAL_TABLET | Freq: Once | ORAL | Status: AC
  Administered 2024-04-20: 8 mg via ORAL

## 2024-04-20 MED ORDER — ONDANSETRON 4 MG PO TBDP
ORAL_TABLET | ORAL | Status: AC
  Filled 2024-04-20: qty 2

## 2024-04-20 MED ORDER — ONDANSETRON HCL 8 MG PO TABS: 8.0000 mg | ORAL_TABLET | Freq: Once | ORAL | Status: DC

## 2024-04-20 NOTE — Progress Notes (Signed)
 The patient came today for simulation after discussions with Dr. Autumn and after our conversation. She is ready to proceed with radiation at this time. Written consent is obtained and placed in the chart, a copy was provided to the patient. She will simulate today and start treatment early next week.

## 2024-04-21 ENCOUNTER — Ambulatory Visit (INDEPENDENT_AMBULATORY_CARE_PROVIDER_SITE_OTHER): Payer: Self-pay | Admitting: *Deleted

## 2024-04-21 ENCOUNTER — Other Ambulatory Visit (HOSPITAL_COMMUNITY): Payer: Self-pay

## 2024-04-21 VITALS — BP 113/72 | HR 73 | Temp 98.4°F | Resp 16 | Wt 107.4 lb

## 2024-04-21 DIAGNOSIS — E860 Dehydration: Secondary | ICD-10-CM

## 2024-04-21 DIAGNOSIS — C220 Liver cell carcinoma: Secondary | ICD-10-CM

## 2024-04-21 MED ORDER — ONDANSETRON HCL 4 MG/2ML IJ SOLN
8.0000 mg | Freq: Once | INTRAMUSCULAR | Status: AC
  Administered 2024-04-21: 8 mg via INTRAVENOUS
  Filled 2024-04-21: qty 4

## 2024-04-21 MED ORDER — SODIUM CHLORIDE 0.9 % IV BOLUS
1000.0000 mL | Freq: Once | INTRAVENOUS | Status: AC
  Administered 2024-04-21: 1000 mL via INTRAVENOUS
  Filled 2024-04-21: qty 1000

## 2024-04-21 MED ORDER — HEPARIN SOD (PORK) LOCK FLUSH 100 UNIT/ML IV SOLN
500.0000 [IU] | Freq: Once | INTRAVENOUS | Status: AC | PRN
  Administered 2024-04-21: 500 [IU]
  Filled 2024-04-21: qty 5

## 2024-04-21 NOTE — Progress Notes (Signed)
 Diagnosis: Dehydration  Provider:  Mannam, Praveen MD  Procedure: IV Infusion  IV Type: Port a Cath, IV Location: R Chest  Normal Saline, Dose: 1000 ml  Infusion Start Time: 1150  Infusion Stop Time: 1402  Post Infusion IV Care: Port a Cath Deaccessed/Flushed and heparin  locked  Discharge: Condition: Stable, Destination: Home . AVS Provided  Performed by:  Masyn Fullam E, RN

## 2024-04-22 ENCOUNTER — Ambulatory Visit
Admission: RE | Admit: 2024-04-22 | Discharge: 2024-04-22 | Disposition: A | Discharge status: Home / Self Care | Payer: Self-pay | Source: Ambulatory Visit | Attending: Radiation Oncology | Admitting: Radiation Oncology

## 2024-04-23 ENCOUNTER — Other Ambulatory Visit: Payer: Self-pay

## 2024-04-23 ENCOUNTER — Encounter (HOSPITAL_COMMUNITY): Payer: Self-pay

## 2024-04-23 ENCOUNTER — Inpatient Hospital Stay (HOSPITAL_COMMUNITY)
Admission: EM | Admit: 2024-04-23 | Discharge: 2024-04-27 | DRG: 436 | Disposition: A | Discharge status: Home / Self Care | Payer: Self-pay | Attending: Internal Medicine | Admitting: Internal Medicine

## 2024-04-23 DIAGNOSIS — D638 Anemia in other chronic diseases classified elsewhere: Secondary | ICD-10-CM | POA: Diagnosis present

## 2024-04-23 DIAGNOSIS — Z7189 Other specified counseling: Secondary | ICD-10-CM

## 2024-04-23 DIAGNOSIS — Z79899 Other long term (current) drug therapy: Secondary | ICD-10-CM

## 2024-04-23 DIAGNOSIS — R7401 Elevation of levels of liver transaminase levels: Secondary | ICD-10-CM | POA: Diagnosis present

## 2024-04-23 DIAGNOSIS — F419 Anxiety disorder, unspecified: Secondary | ICD-10-CM | POA: Diagnosis present

## 2024-04-23 DIAGNOSIS — B181 Chronic viral hepatitis B without delta-agent: Secondary | ICD-10-CM | POA: Diagnosis present

## 2024-04-23 DIAGNOSIS — Z603 Acculturation difficulty: Secondary | ICD-10-CM | POA: Diagnosis present

## 2024-04-23 DIAGNOSIS — R109 Unspecified abdominal pain: Principal | ICD-10-CM

## 2024-04-23 DIAGNOSIS — C220 Liver cell carcinoma: Principal | ICD-10-CM | POA: Diagnosis present

## 2024-04-23 DIAGNOSIS — R112 Nausea with vomiting, unspecified: Secondary | ICD-10-CM | POA: Diagnosis present

## 2024-04-23 DIAGNOSIS — Z923 Personal history of irradiation: Secondary | ICD-10-CM

## 2024-04-23 DIAGNOSIS — E860 Dehydration: Secondary | ICD-10-CM | POA: Diagnosis present

## 2024-04-23 DIAGNOSIS — G893 Neoplasm related pain (acute) (chronic): Secondary | ICD-10-CM

## 2024-04-23 DIAGNOSIS — D630 Anemia in neoplastic disease: Secondary | ICD-10-CM | POA: Diagnosis present

## 2024-04-23 LAB — URINALYSIS, ROUTINE W REFLEX MICROSCOPIC
Bilirubin Urine: NEGATIVE
Glucose, UA: NEGATIVE mg/dL
Hgb urine dipstick: NEGATIVE
Ketones, ur: NEGATIVE mg/dL
Leukocytes,Ua: NEGATIVE
Nitrite: NEGATIVE
Protein, ur: NEGATIVE mg/dL
Specific Gravity, Urine: 1.01 (ref 1.005–1.030)
pH: 8 (ref 5.0–8.0)

## 2024-04-23 LAB — CBC WITH DIFFERENTIAL/PLATELET
Abs Immature Granulocytes: 0.01 K/uL (ref 0.00–0.07)
Basophils Absolute: 0.1 K/uL (ref 0.0–0.1)
Basophils Relative: 2 %
Eosinophils Absolute: 0.5 K/uL (ref 0.0–0.5)
Eosinophils Relative: 7 %
HCT: 24.1 % — ABNORMAL LOW (ref 36.0–46.0)
Hemoglobin: 7.8 g/dL — ABNORMAL LOW (ref 12.0–15.0)
Immature Granulocytes: 0 %
Lymphocytes Relative: 19 %
Lymphs Abs: 1.2 K/uL (ref 0.7–4.0)
MCH: 25.7 pg — ABNORMAL LOW (ref 26.0–34.0)
MCHC: 32.4 g/dL (ref 30.0–36.0)
MCV: 79.3 fL — ABNORMAL LOW (ref 80.0–100.0)
Monocytes Absolute: 0.5 K/uL (ref 0.1–1.0)
Monocytes Relative: 8 %
Neutro Abs: 4 K/uL (ref 1.7–7.7)
Neutrophils Relative %: 64 %
Platelets: 293 K/uL (ref 150–400)
RBC: 3.04 MIL/uL — ABNORMAL LOW (ref 3.87–5.11)
RDW: 22.4 % — ABNORMAL HIGH (ref 11.5–15.5)
WBC: 6.3 K/uL (ref 4.0–10.5)
nRBC: 0 % (ref 0.0–0.2)

## 2024-04-23 LAB — LIPASE, BLOOD: Lipase: 28 U/L (ref 11–51)

## 2024-04-23 LAB — MAGNESIUM: Magnesium: 2 mg/dL (ref 1.7–2.4)

## 2024-04-23 MED ORDER — HYDROMORPHONE HCL 1 MG/ML IJ SOLN
0.5000 mg | Freq: Once | INTRAMUSCULAR | Status: AC
  Administered 2024-04-23: 0.5 mg via INTRAVENOUS
  Filled 2024-04-23: qty 1

## 2024-04-23 MED ORDER — METOCLOPRAMIDE HCL 5 MG/ML IJ SOLN
10.0000 mg | Freq: Once | INTRAMUSCULAR | Status: AC
  Administered 2024-04-23: 10 mg via INTRAVENOUS
  Filled 2024-04-23: qty 2

## 2024-04-23 MED ORDER — LACTATED RINGERS IV BOLUS
1000.0000 mL | Freq: Once | INTRAVENOUS | Status: AC
  Administered 2024-04-23: 1000 mL via INTRAVENOUS

## 2024-04-23 NOTE — ED Provider Notes (Signed)
 [place] EMERGENCY DEPARTMENT AT [facility] Provider Note  CSN: [id number] Arrival date & time: 04/23/24 1934  Chief Complaint(s) Weakness  HPI Christina Gutierrez is a 48 y.o. female history of hepatocellular carcinoma, prior small bowel obstruction presenting with abdominal pain.  Patient reports nausea and vomiting, weakness, right upper quadrant abdominal pain starting today.  She reports she is taking her home Zofran  without any relief.  She reports associated lightheadedness, worse with standing and walking.  she reports she has not been able to eat much today.  No constipation.  No blood in her stool or melena.  No painful urination.  Spanish interpreter used  Past Medical History Past Medical History:  Diagnosis Date   Cancer (HCC)    Hepatocellular carcinoma (HCC) 08/22/2023   Metabolic acidosis 08/21/2023   Patient Active Problem List   Diagnosis Date Noted   Anemia of chronic disease 04/17/2024   Palliative care encounter 04/17/2024   Medication management 04/17/2024   Counseling and coordination of care 04/17/2024   Goals of care, counseling/discussion 04/17/2024   Abdominal pain 03/17/2024   Dehydration 03/14/2024   Gingival bleeding 11/13/2023   Port-A-Cath in place 10/23/2023   Situational insomnia 10/02/2023   Cancer associated pain 08/28/2023   Portal venous hypertension (HCC) 08/23/2023   Intractable pain 08/23/2023   Hepatocellular carcinoma (HCC) 08/22/2023   Hepatic failure (HCC) 08/21/2023   Normocytic anemia 08/21/2023   Transaminitis 08/21/2023   Disseminated varicella 08/21/2023   Hyperammonemia 08/21/2023   Chronic viral hepatitis B without delta-agent (HCC) 08/21/2023   Home Medication(s) Prior to Admission medications   Medication Sig Start Date End Date Taking? Authorizing Provider  ALPRAZolam  (XANAX ) 0.5 MG tablet Take 1 tablet (0.5 mg total) by mouth at bedtime as needed for anxiety. 04/18/24   Ghimire, Kuber, MD   lidocaine -prilocaine  (EMLA ) cream Apply 1 Application topically as needed. 02/26/24   Pasam, Chinita, MD  ondansetron  (ZOFRAN ) 8 MG tablet Take 1 tablet (8 mg total) by mouth every 8 (eight) hours as needed for nausea or vomiting. 01/15/24   Pasam, Chinita, MD  oxyCODONE  (ROXICODONE ) 5 MG/5ML solution Take 5 mLs (5 mg total) by mouth every 4 (four) hours as needed for up to 7 days for moderate pain (pain score 4-6) or severe pain (pain score 7-10). 04/18/24 04/25/24  Raenelle Coria, MD  pantoprazole  (PROTONIX ) 40 MG tablet Take 1 tablet (40 mg total) by mouth daily at 6 (six) AM. 04/08/24   Pasam, Avinash, MD  polyethylene glycol powder (GLYCOLAX /MIRALAX ) 17 GM/SCOOP powder Take 17 g by mouth daily. Dissolve 1 capful (17g) in 4-8 ounces of liquid and take by mouth daily. 04/19/24   Raenelle Coria, MD  prochlorperazine  (COMPAZINE ) 10 MG tablet Take 1 tablet (10 mg total) by mouth every 6 (six) hours as needed for nausea or vomiting. 02/11/24   Boscia, Heather E, NP  senna-docusate (SENOKOT-S) 8.6-50 MG tablet Take 1 tablet by mouth 2 (two) times daily. 04/18/24   Ghimire, Kuber, MD  tenofovir  alafenamide (VEMLIDY ) 25 MG tablet Take 1 tablet (25 mg total) by mouth daily. 09/22/23   Fleeta Kathie Jomarie LOISE, MD  Past Surgical History Past Surgical History:  Procedure Laterality Date   CESAREAN SECTION     IR IMAGING GUIDED PORT INSERTION  09/22/2023   Family History History reviewed. No pertinent family history.  Social History Social History   Tobacco Use   Smoking status: Never   Smokeless tobacco: Never  Vaping Use   Vaping status: Never Used  Substance Use Topics   Alcohol use: No   Drug use: No   Allergies Patient has no known allergies.  Review of Systems Review of Systems  All other systems reviewed and are negative.   Physical Exam Vital Signs  I have  reviewed the triage vital signs BP 125/77   Pulse 87   Temp 98.2 F (36.8 C)   Resp 18   SpO2 93%  Physical Exam Vitals and nursing note reviewed.  Constitutional:      General: She is not in acute distress.    Appearance: She is well-developed.  HENT:     Head: Normocephalic and atraumatic.     Mouth/Throat:     Mouth: Mucous membranes are dry.  Eyes:     Pupils: Pupils are equal, round, and reactive to light.  Cardiovascular:     Rate and Rhythm: Normal rate and regular rhythm.     Heart sounds: No murmur heard. Pulmonary:     Effort: Pulmonary effort is normal. No respiratory distress.     Breath sounds: Normal breath sounds.  Abdominal:     General: Abdomen is flat.     Palpations: Abdomen is soft.     Tenderness: There is abdominal tenderness (RUQ).  Musculoskeletal:        General: No tenderness.     Right lower leg: No edema.     Left lower leg: No edema.  Skin:    General: Skin is warm and dry.  Neurological:     General: No focal deficit present.     Mental Status: She is alert. Mental status is at baseline.  Psychiatric:        Mood and Affect: Mood normal.        Behavior: Behavior normal.     ED Results and Treatments Labs (all labs ordered are listed, but only abnormal results are displayed) Labs Reviewed  CBC WITH DIFFERENTIAL/PLATELET - Abnormal; Notable for the following components:      Result Value   RBC 3.04 (*)    Hemoglobin 7.8 (*)    HCT 24.1 (*)    MCV 79.3 (*)    MCH 25.7 (*)    RDW 22.4 (*)    All other components within normal limits  URINALYSIS, ROUTINE W REFLEX MICROSCOPIC  LIPASE, BLOOD  MAGNESIUM   COMPREHENSIVE METABOLIC PANEL WITH GFR  HCG, SERUM, QUALITATIVE                                                                                                                          Radiology No results found.  Pertinent labs &  imaging results that were available during my care of the patient were reviewed by me and  considered in my medical decision making (see MDM for details).  Medications Ordered in ED Medications  HYDROmorphone  (DILAUDID ) injection 0.5 mg (has no administration in time range)  lactated ringers  bolus 1,000 mL (1,000 mLs Intravenous New Bag/Given 04/23/24 2153)  metoCLOPramide  (REGLAN ) injection 10 mg (10 mg Intravenous Given 04/23/24 2151)                                                                                                                                     Procedures Procedures  (including critical care time)  Medical Decision Making / ED Course   MDM:  48 year old presents to the emergency room with abdominal pain.  Patient overall well-appearing but also appears somewhat chronically ill.  Does have right upper quadrant tenderness on exam.  Appears mildly dehydrated.  Differential includes recurrent bowel obstruction, tumor progression, cancer related pain, volvulus, perforation, abscess, pancreatitis, cholecystitis.  Will check testing CMP and lipase.  Will obtain CT scan to further evaluate symptoms.  Will reassess.  Signed out to oncoming provider Dr. Carita pending remainder workup and reassessment.      Additional history obtained: -Additional history obtained from family -External records from outside source obtained and reviewed including: Chart review including previous notes, labs, imaging, consultation notes including prior notes    Lab Tests: -I ordered, reviewed, and interpreted labs.   The pertinent results include:   Labs Reviewed  CBC WITH DIFFERENTIAL/PLATELET - Abnormal; Notable for the following components:      Result Value   RBC 3.04 (*)    Hemoglobin 7.8 (*)    HCT 24.1 (*)    MCV 79.3 (*)    MCH 25.7 (*)    RDW 22.4 (*)    All other components within normal limits  URINALYSIS, ROUTINE W REFLEX MICROSCOPIC  LIPASE, BLOOD  MAGNESIUM   COMPREHENSIVE METABOLIC PANEL WITH GFR  HCG, SERUM, QUALITATIVE    Notable for chronic  anemia     Medicines ordered and prescription drug management: Meds ordered this encounter  Medications   lactated ringers  bolus 1,000 mL   metoCLOPramide  (REGLAN ) injection 10 mg   HYDROmorphone  (DILAUDID ) injection 0.5 mg    -I have reviewed the patients home medicines and have made adjustments as needed   Reevaluation: After the interventions noted above, I reevaluated the patient and found that their symptoms have improved  Co morbidities that complicate the patient evaluation  Past Medical History:  Diagnosis Date   Cancer (HCC)    Hepatocellular carcinoma (HCC) 08/22/2023   Metabolic acidosis 08/21/2023      Dispostion: Disposition decision including need for hospitalization was considered, and patient disposition pending at time of sign out.    Final Clinical Impression(s) / ED Diagnoses Final diagnoses:  Abdominal pain, unspecified abdominal location     This chart was dictated using voice recognition  software.  Despite best efforts to proofread,  errors can occur which can change the documentation meaning.    Francesca Elsie CROME, MD 04/23/24 [phone]

## 2024-04-23 NOTE — ED Triage Notes (Signed)
 Pt presents via POV c/o dizziness, weakness and N/V starting today. Denies pain.

## 2024-04-23 NOTE — ED Notes (Signed)
 Rainbow sent to lab to hold

## 2024-04-24 ENCOUNTER — Observation Stay (HOSPITAL_COMMUNITY): Payer: Self-pay

## 2024-04-24 ENCOUNTER — Emergency Department (HOSPITAL_COMMUNITY): Payer: Self-pay

## 2024-04-24 DIAGNOSIS — R112 Nausea with vomiting, unspecified: Secondary | ICD-10-CM | POA: Diagnosis present

## 2024-04-24 LAB — COMPREHENSIVE METABOLIC PANEL WITH GFR
ALT: 144 U/L — ABNORMAL HIGH (ref 0–44)
ALT: 130 U/L — ABNORMAL HIGH (ref 0–44)
AST: 293 U/L — ABNORMAL HIGH (ref 15–41)
AST: 264 U/L — ABNORMAL HIGH (ref 15–41)
Albumin: 3.8 g/dL (ref 3.5–5.0)
Albumin: 3.7 g/dL (ref 3.5–5.0)
Alkaline Phosphatase: 123 U/L (ref 38–126)
Alkaline Phosphatase: 113 U/L (ref 38–126)
Anion gap: 13 (ref 5–15)
Anion gap: 12 (ref 5–15)
BUN: 6 mg/dL (ref 6–20)
BUN: <5 mg/dL — ABNORMAL LOW (ref 6–20)
CO2: 22 mmol/L (ref 22–32)
CO2: 22 mmol/L (ref 22–32)
Calcium: 10 mg/dL (ref 8.9–10.3)
Calcium: 9.5 mg/dL (ref 8.9–10.3)
Chloride: 99 mmol/L (ref 98–111)
Chloride: 104 mmol/L (ref 98–111)
Creatinine, Ser: 0.55 mg/dL (ref 0.44–1.00)
Creatinine, Ser: 0.44 mg/dL (ref 0.44–1.00)
GFR, Estimated: >60 mL/min (ref >60)
GFR, Estimated: >60 mL/min (ref >60)
Glucose, Bld: 86 mg/dL (ref 70–99)
Glucose, Bld: 68 mg/dL — ABNORMAL LOW (ref 70–99)
Potassium: 4.6 mmol/L (ref 3.5–5.1)
Potassium: 3.9 mmol/L (ref 3.5–5.1)
Sodium: 133 mmol/L — ABNORMAL LOW (ref 135–145)
Sodium: 138 mmol/L (ref 135–145)
Total Bilirubin: 1.1 mg/dL (ref 0.0–1.2)
Total Bilirubin: 1.1 mg/dL (ref 0.0–1.2)
Total Protein: 8.1 g/dL (ref 6.5–8.1)
Total Protein: 7.5 g/dL (ref 6.5–8.1)

## 2024-04-24 LAB — I-STAT CHEM 8, ED
BUN: 4 mg/dL — ABNORMAL LOW (ref 6–20)
Calcium, Ion: 1.18 mmol/L (ref 1.15–1.40)
Chloride: 102 mmol/L (ref 98–111)
Creatinine, Ser: 0.5 mg/dL (ref 0.44–1.00)
Glucose, Bld: 95 mg/dL (ref 70–99)
HCT: 27 % — ABNORMAL LOW (ref 36.0–46.0)
Hemoglobin: 9.2 g/dL — ABNORMAL LOW (ref 12.0–15.0)
Potassium: 4.8 mmol/L (ref 3.5–5.1)
Sodium: 137 mmol/L (ref 135–145)
TCO2: 21 mmol/L — ABNORMAL LOW (ref 22–32)

## 2024-04-24 LAB — CBC
HCT: 22.7 % — ABNORMAL LOW (ref 36.0–46.0)
Hemoglobin: 7.5 g/dL — ABNORMAL LOW (ref 12.0–15.0)
MCH: 26.4 pg (ref 26.0–34.0)
MCHC: 33 g/dL (ref 30.0–36.0)
MCV: 79.9 fL — ABNORMAL LOW (ref 80.0–100.0)
Platelets: 276 K/uL (ref 150–400)
RBC: 2.84 MIL/uL — ABNORMAL LOW (ref 3.87–5.11)
RDW: 22.5 % — ABNORMAL HIGH (ref 11.5–15.5)
WBC: 6.1 K/uL (ref 4.0–10.5)
nRBC: 0 % (ref 0.0–0.2)

## 2024-04-24 LAB — PROTIME-INR
INR: 1.5 — ABNORMAL HIGH (ref 0.8–1.2)
Prothrombin Time: 18.4 s — ABNORMAL HIGH (ref 11.4–15.2)

## 2024-04-24 LAB — PREGNANCY, URINE: Preg Test, Ur: NEGATIVE

## 2024-04-24 LAB — TYPE AND SCREEN
ABO/RH(D): O POS
Antibody Screen: NEGATIVE

## 2024-04-24 LAB — CBG MONITORING, ED: Glucose-Capillary: 123 mg/dL — ABNORMAL HIGH (ref 70–99)

## 2024-04-24 MED ORDER — FENTANYL CITRATE (PF) 50 MCG/ML IJ SOSY
25.0000 ug | PREFILLED_SYRINGE | INTRAMUSCULAR | Status: DC | PRN
Start: 2024-04-24 — End: 2024-04-27
  Administered 2024-04-24 – 2024-04-26 (×3): 25 ug via INTRAVENOUS
  Filled 2024-04-24 (×3): qty 1

## 2024-04-24 MED ORDER — MAGNESIUM CITRATE PO SOLN: 0.5000 | Freq: Once | ORAL | Status: DC | PRN

## 2024-04-24 MED ORDER — IOHEXOL 300 MG/ML  SOLN
100.0000 mL | Freq: Once | INTRAMUSCULAR | Status: AC | PRN
Start: 2024-04-24 — End: 2024-04-24
  Administered 2024-04-24: 100 mL via INTRAVENOUS

## 2024-04-24 MED ORDER — SMOG ENEMA
400.0000 mL | Freq: Once | RECTAL | Status: DC | PRN
Start: 2024-04-24 — End: 2024-04-27

## 2024-04-24 MED ORDER — DEXTROSE 50 % IV SOLN: 12.5000 g | INTRAVENOUS | Status: DC | PRN

## 2024-04-24 MED ORDER — ACETAMINOPHEN 650 MG RE SUPP: 650.0000 mg | Freq: Four times a day (QID) | RECTAL | Status: DC | PRN

## 2024-04-24 MED ORDER — GADOBUTROL 1 MMOL/ML IV SOLN
5.0000 mL | Freq: Once | INTRAVENOUS | Status: AC | PRN
  Administered 2024-04-24: 5 mL via INTRAVENOUS

## 2024-04-24 MED ORDER — ONDANSETRON HCL 4 MG/2ML IJ SOLN
4.0000 mg | Freq: Once | INTRAMUSCULAR | Status: AC
  Administered 2024-04-24: 4 mg via INTRAVENOUS
  Filled 2024-04-24: qty 2

## 2024-04-24 MED ORDER — METOCLOPRAMIDE HCL 5 MG/ML IJ SOLN
10.0000 mg | Freq: Four times a day (QID) | INTRAMUSCULAR | Status: DC | PRN
  Administered 2024-04-26 – 2024-04-27 (×2): 10 mg via INTRAVENOUS
  Filled 2024-04-24 (×2): qty 2

## 2024-04-24 MED ORDER — MELATONIN 3 MG PO TABS: 3.0000 mg | ORAL_TABLET | Freq: Every evening | ORAL | Status: DC | PRN

## 2024-04-24 MED ORDER — ACETAMINOPHEN 325 MG PO TABS: 650.0000 mg | ORAL_TABLET | Freq: Four times a day (QID) | ORAL | Status: DC | PRN

## 2024-04-24 MED ORDER — ONDANSETRON HCL 4 MG/2ML IJ SOLN
4.0000 mg | Freq: Four times a day (QID) | INTRAMUSCULAR | Status: DC | PRN
  Administered 2024-04-26: 4 mg via INTRAVENOUS
  Filled 2024-04-24: qty 2

## 2024-04-24 MED ORDER — SODIUM CHLORIDE 0.9 % IV SOLN: INTRAVENOUS | Status: AC

## 2024-04-24 MED ORDER — LACTATED RINGERS IV BOLUS
1000.0000 mL | Freq: Once | INTRAVENOUS | Status: AC
  Administered 2024-04-24: 1000 mL via INTRAVENOUS

## 2024-04-24 MED ORDER — POLYETHYLENE GLYCOL 3350 17 G PO PACK
17.0000 g | PACK | Freq: Every day | ORAL | Status: DC
  Administered 2024-04-24: 17 g via ORAL
  Filled 2024-04-24: qty 1

## 2024-04-24 NOTE — H&P (Signed)
 History and Physical    Patient: Christina Gutierrez FMW:[id number] DOB: Nov 09, 1975 DOA: 04/23/2024 DOS: the patient was seen and examined on 04/24/2024 PCP: [name], MD  Patient coming from: Home  Chief Complaint:  Chief Complaint  Patient presents with   Weakness   HPI: Christina Gutierrez is a 48 y.o. female with medical history significant of chronic hep B, hepatocellular carcinoma, anemia chronic disease, chronic cancer associated pain who presented to the emergency department yesterday evening complaints of generalized weakness associated with dizziness, nausea and vomiting since earlier in the day.  No significant abdominal pain at this time. No diarrhea, constipation, melena or hematochezia.  No flank pain, dysuria, frequency or hematuria. He denied fever, chills, rhinorrhea, sore throat, wheezing or hemoptysis.  No chest pain, palpitations, diaphoresis, PND, orthopnea or pitting edema of the lower extremities.   No polyuria, polydipsia, polyphagia or blurred vision.   Lab work: Urinalysis was normal.  CBC showed a white count of 6.3, hemoglobin 7.8 g/dL and platelets 706.  Negative urine pregnancy test.  Normal lipase and magnesium .  I-STAT Chem-8 showed normal electrolytes and unremarkable renal function.  Imaging: CT abdomen/pelvis with contrast showed multifocal hepatocellular carcinoma largest lesion measuring up to 16 cm, causing persistent mass effect on the inferior vena cava.  No evidence of bowel obstruction.  ED course: Initial vital signs were temperature 98.1 F, pulse 95, respiration 21, BP 121/82 mmHg and O2 sat 98% on room air the patient received 2000 mL of normal saline bolus, metoclopramide  10 mg IVP, ondansetron  4 mg IVP, hydromorphone  0.5 mg IVP and fentanyl  25 mcg IVP.   Review of Systems: As mentioned in the history of present illness. All other systems reviewed and are negative. Past Medical History:  Diagnosis Date   Cancer [facility])     Hepatocellular carcinoma (HCC) 08/22/2023   Metabolic acidosis 08/21/2023   Past Surgical History:  Procedure Laterality Date   CESAREAN SECTION     IR IMAGING GUIDED PORT INSERTION  09/22/2023   Social History:  reports that she has never smoked. She has never used smokeless tobacco. She reports that she does not drink alcohol and does not use drugs.  No Known Allergies  History reviewed. No pertinent family history.  Prior to Admission medications   Medication Sig Start Date End Date Taking? Authorizing Provider  ALPRAZolam  (XANAX ) 0.5 MG tablet Take 1 tablet (0.5 mg total) by mouth at bedtime as needed for anxiety. 04/18/24  Yes Raenelle Coria, MD  lidocaine -prilocaine  (EMLA ) cream Apply 1 Application topically as needed. 02/26/24  Yes Pasam, Millman, MD  ondansetron  (ZOFRAN ) 8 MG tablet Take 1 tablet (8 mg total) by mouth every 8 (eight) hours as needed for nausea or vomiting. 01/15/24  Yes Pasam, Avinash, MD  oxyCODONE  (ROXICODONE ) 5 MG/5ML solution Take 5 mLs (5 mg total) by mouth every 4 (four) hours as needed for up to 7 days for moderate pain (pain score 4-6) or severe pain (pain score 7-10). 04/18/24 04/25/24 Yes Raenelle Coria, MD  pantoprazole  (PROTONIX ) 40 MG tablet Take 1 tablet (40 mg total) by mouth daily at 6 (six) AM. 04/08/24  Yes Pasam, Avinash, MD  polyethylene glycol powder (GLYCOLAX /MIRALAX ) 17 GM/SCOOP powder Take 17 g by mouth daily. Dissolve 1 capful (17g) in 4-8 ounces of liquid and take by mouth daily. Patient taking differently: Take 17 g by mouth daily as needed for mild constipation. 04/19/24  Yes Raenelle Coria, MD  prochlorperazine  (COMPAZINE ) 10 MG tablet Take 1 tablet (10 mg total)  by mouth every 6 (six) hours as needed for nausea or vomiting. 02/11/24  Yes Boscia, Heather E, NP  senna-docusate (SENOKOT-S) 8.6-50 MG tablet Take 1 tablet by mouth 2 (two) times daily. Patient taking differently: Take 1 tablet by mouth at bedtime as needed for mild constipation.  04/18/24  Yes Ghimire, Kuber, MD  tenofovir  alafenamide (VEMLIDY ) 25 MG tablet Take 1 tablet (25 mg total) by mouth daily. 09/22/23  Yes Fleeta Kathie Jomarie LOISE, MD    Physical Exam: Vitals:   04/24/24 0230 04/24/24 0300 04/24/24 0330 04/24/24 0400  BP: 127/87 129/80 119/85 130/79  Pulse: 84 72 79 79  Resp: 14 17 15 17   Temp:    98.7 F (37.1 C)  TempSrc:      SpO2: 94% 95% 95% 94%   Physical Exam Vitals reviewed.  Constitutional:      General: She is awake. She is not in acute distress.    Appearance: She is ill-appearing.  HENT:     Head: Normocephalic.     Nose: No rhinorrhea.     Mouth/Throat:     Mouth: Mucous membranes are moist.  Eyes:     General: No scleral icterus.    Pupils: Pupils are equal, round, and reactive to light.  Neck:     Vascular: No JVD.  Cardiovascular:     Rate and Rhythm: Normal rate and regular rhythm.     Heart sounds: S1 normal and S2 normal.  Pulmonary:     Effort: No accessory muscle usage or respiratory distress.     Breath sounds: No wheezing, rhonchi or rales.  Abdominal:     General: Bowel sounds are normal.     Palpations: Abdomen is soft.     Tenderness: There is no right CVA tenderness or left CVA tenderness.  Musculoskeletal:     Cervical back: Neck supple.     Right lower leg: No edema.     Left lower leg: No edema.  Skin:    General: Skin is warm and dry.  Neurological:     General: No focal deficit present.     Mental Status: She is alert and oriented to person, place, and time.  Psychiatric:        Mood and Affect: Mood normal.        Behavior: Behavior normal. Behavior is cooperative.     Data Reviewed:  Results are pending, will review when available. EKG: Vent. rate 89 BPM PR interval 169 ms QRS duration 93 ms QT/QTcB 360/438 ms P-R-T axes 55 142 45 Sinus rhythm Right axis deviation Low voltage, precordial leads Nonspecific T abnormalities, anterior leads  Assessment and Plan: Principal Problem:    Intractable nausea and vomiting Observation/MedSurg. Continue IV fluids. Soft diet as tolerated. Analgesics as needed. Antiemetics as needed. Pantoprazole  40 mg IVP daily. Follow CBC, CMP and lipase in AM.  Active Problems:   Transaminitis In the setting of:   Hepatocellular carcinoma (HCC) Follow LFTs as needed. Analgesics as needed for pain.    Anemia of chronic disease Monitor hematocrit and hemoglobin. Transfuse as needed.    Advance Care Planning:   Code Status: Full Code   Consults:   Family Communication:   Severity of Illness: The appropriate patient status for this patient is OBSERVATION. Observation status is judged to be reasonable and necessary in order to provide the required intensity of service to ensure the patient's safety. The patient's presenting symptoms, physical exam findings, and initial radiographic and laboratory data in the context  of their medical condition is felt to place them at decreased risk for further clinical deterioration. Furthermore, it is anticipated that the patient will be medically stable for discharge from the hospital within 2 midnights of admission.   Author: Alm Dorn Castor, MD 04/24/2024 7:54 AM  For on call review [email or web address].   This document was prepared using Dragon voice recognition software and may contain some unintended transcription errors.

## 2024-04-24 NOTE — ED Notes (Signed)
 Assisted patient to restroom.

## 2024-04-24 NOTE — ED Notes (Signed)
 Patient given breakfast tray and set up.

## 2024-04-24 NOTE — Progress Notes (Signed)
 Completed admission using [employer] via iPad.

## 2024-04-24 NOTE — ED Provider Notes (Signed)
 Late entry: Care assumed from Dr. Francesca. Patient with [facility] here with abdominal pain and vomiting despite zofran  at home. CT pending to assess for SBO.   CT without SBO. Stable HCC.  Labs with stable anemia.   Ongoing vomiting and nausea and feeling lightheaded.plan admission for rehydration and symptom control.  D/w Dr. Marcene.  MRI brain pending to assess for metastasis given her dizziness and vomiting. However no brain mets were present in August 2025.     Christina Senior, MD 04/24/24 [phone]

## 2024-04-24 NOTE — Progress Notes (Signed)
  Carryover admission to the Day Admitter.  I discussed this case with the EDP, Dr. Carita.  Per these discussions:   This is a  25 F w/ h/o hepatocellular carcinoma, who is being admitted with intractable nausea/vomiting after presenting to the ED complaining of 1 day of persistent nausea resulting in several episodes of nonbloody, nonbilious emesis, unable to tolerate p.o. over that timeframe.  This been associated with discomfort as well as dizziness, lightheadedness when rising from a seated to standing position, without associated loss of consciousness.  She has a history of chronic anemia, and was reported to receive transfusion of 1 unit PRBC last week when hemoglobin was found to be less than 7.  In the ED this evening, vital signs notable for the following: Afebrile; initial heart rates in the 90s, Sosan decreasing into the 70s following administration of 2 L of lactated Ringer's; systolic blood pressures in the 1 teens to 120s.  CBC today shows hemoglobin 7.8.  CT abdomen/pelvis shows no evidence of bowel obstruction, but is reported to show evidence of metastatic disease pushing against the inferior vena cava.  As further evaluation of her intractable nausea/vomiting/dizziness, EDP is also ordered MRI of the brain with and without contrast to evaluate for any contribution from metastatic disease to the brain, with result of the scan currently pending.  She has received doses of Zofran  and Reglan  in the ED as well as a dose of IV Dilaudid  for her abdominal discomfort.  I have placed an order for observation to med/tele for further evaluation and management of the above.  I have placed some additional preliminary admit orders via the adult multi-morbid admission order set. I have also ordered prn IV Zofran , prn IV Reglan  for refractory nausea/vomiting, prn IV fentanyl  for pain.  Also ordered fall precautions, INR.  No evidence of any active bleed.  I ordered type and screen given her  history of requiring RBC transfusion last week, with presenting hemoglobin today of 7.8.  There is an existing order for orthostatic vital signs, which is currently pending.    Eva Pore, DO Hospitalist

## 2024-04-24 NOTE — Plan of Care (Signed)

## 2024-04-25 ENCOUNTER — Ambulatory Visit: Payer: Self-pay

## 2024-04-25 LAB — CBC
HCT: 26 % — ABNORMAL LOW (ref 36.0–46.0)
Hemoglobin: 8.4 g/dL — ABNORMAL LOW (ref 12.0–15.0)
MCH: 26.3 pg (ref 26.0–34.0)
MCHC: 32.3 g/dL (ref 30.0–36.0)
MCV: 81.5 fL (ref 80.0–100.0)
Platelets: 348 K/uL (ref 150–400)
RBC: 3.19 MIL/uL — ABNORMAL LOW (ref 3.87–5.11)
RDW: 22.8 % — ABNORMAL HIGH (ref 11.5–15.5)
WBC: 5.9 K/uL (ref 4.0–10.5)
nRBC: 0 % (ref 0.0–0.2)

## 2024-04-25 MED ORDER — SENNOSIDES-DOCUSATE SODIUM 8.6-50 MG PO TABS
1.0000 | ORAL_TABLET | Freq: Two times a day (BID) | ORAL | Status: DC
  Administered 2024-04-25 – 2024-04-27 (×5): 1 via ORAL
  Filled 2024-04-25 (×5): qty 1

## 2024-04-25 MED ORDER — TENOFOVIR ALAFENAMIDE FUMARATE 25 MG PO TABS
25.0000 mg | ORAL_TABLET | Freq: Every day | ORAL | Status: DC
  Administered 2024-04-25 – 2024-04-27 (×3): 25 mg via ORAL
  Filled 2024-04-25 (×3): qty 1

## 2024-04-25 MED ORDER — OXYCODONE HCL 5 MG/5ML PO SOLN: 5.0000 mg | ORAL | Status: DC | PRN

## 2024-04-25 MED ORDER — ONDANSETRON HCL 4 MG/2ML IJ SOLN: 8.0000 mg | Freq: Once | INTRAMUSCULAR | Status: DC

## 2024-04-25 MED ORDER — POLYETHYLENE GLYCOL 3350 17 G PO PACK
17.0000 g | PACK | Freq: Two times a day (BID) | ORAL | Status: DC
  Administered 2024-04-25 – 2024-04-27 (×5): 17 g via ORAL
  Filled 2024-04-25 (×5): qty 1

## 2024-04-25 MED ORDER — SODIUM CHLORIDE 0.9 % IV BOLUS
1000.0000 mL | Freq: Once | INTRAVENOUS | Status: DC
  Filled 2024-04-25: qty 1000

## 2024-04-25 MED ORDER — ALPRAZOLAM 0.5 MG PO TABS: 0.5000 mg | ORAL_TABLET | Freq: Every evening | ORAL | Status: DC | PRN

## 2024-04-25 MED ORDER — PANTOPRAZOLE SODIUM 40 MG PO TBEC
40.0000 mg | DELAYED_RELEASE_TABLET | Freq: Every day | ORAL | Status: DC
  Administered 2024-04-25 – 2024-04-27 (×3): 40 mg via ORAL
  Filled 2024-04-25 (×3): qty 1

## 2024-04-25 MED ORDER — ENSURE PLUS HIGH PROTEIN PO LIQD
237.0000 mL | Freq: Two times a day (BID) | ORAL | Status: DC
  Administered 2024-04-25 – 2024-04-27 (×4): 237 mL via ORAL

## 2024-04-25 MED ORDER — SODIUM CHLORIDE 0.9 % IV SOLN: INTRAVENOUS | Status: DC

## 2024-04-25 NOTE — Progress Notes (Signed)
 PROGRESS NOTE    Christina Gutierrez  FMW:[id number] DOB: December 14, 1975 DOA: 04/23/2024 PCP: Christina Millman, MD   Brief Narrative: 48 year old with past medical history significant for chronic hepatitis B, hepatocellular carcinoma, anemia of chronic disease, chronic cancer associated pain who presents complaining of generalized weakness, associated dizziness, nausea and vomiting.  CT abdomen and pelvis with contrast showed multifocal hepatocellular carcinoma largest lesion measuring up to 16 cm calcium persistent mass effect in the inferior vena cava.   Assessment & Plan:   Principal Problem:   Intractable nausea and vomiting Active Problems:   Transaminitis   Hepatocellular carcinoma (HCC)   Anemia of chronic disease   1-Intractable nausea vomiting, - Suspect related to malignancy, also could have been related to constipation. - Continue with supportive care, - Plan to advance diet today - CT abdomen and pelvis redemonstration of multifocal hepatocellular carcinoma with largest lesion measuring up to 16 cm calcium persistent mass effect on the inferior vena cava.  Negative for bowel obstruction - Continue MiraLAX , as needed Zofran  and Reglan     Hepatocellular carcinoma Transaminases History of Hepatitis B.  -Patient did not tolerated atezolizumab , last dose given 8/20 to 02/24/2024.  She developed significant fatigue and decreased appetite after treatment. - Case was discussed with GI tumor conference and decision was made to proceed with palliative radiation and IR guided ablation - Patient underwent CT simulation 10/15. - Patient's follow-up with Dr. Autumn Gutierrez - She will start radiation treatment on Wednesday.  Advised to keep appointment - Resume tenofovir   Anemia of chronic disease - Hemoglobin is stable  Dizziness: MRI brain negative  Estimated body mass index is 20.98 kg/m as calculated from the following:   Height as of 04/16/24: 5' (1.524 m).   Weight as  of 04/21/24: 48.7 kg.   DVT prophylaxis: SCDs Code Status: Full code Family Communication: Care discussed with patient Disposition Plan:  Status is: Observation The patient remains OBS appropriate and will d/c before 2 midnights.    Consultants:  None  Procedures:  none  Antimicrobials:    Subjective: She is feeling better today, reporting.  No fever or vomiting.  She will try breakfast  Objective: Vitals:   04/24/24 1320 04/24/24 1643 04/24/24 1930 04/25/24 0601  BP:  110/65 106/71 112/64  Pulse:  82 81 78  Resp:  18 18 18   Temp: 97.8 F (36.6 C) 98.4 F (36.9 C) 98.7 F (37.1 C) 98.2 F (36.8 C)  TempSrc: Oral   Oral  SpO2:  97% 99% 98%    Intake/Output Summary (Last 24 hours) at 04/25/2024 0723 Last data filed at 04/24/2024 1800 Gross per 24 hour  Intake 333.99 ml  Output --  Net 333.99 ml   There were no vitals filed for this visit.  Examination:  General exam: Appears calm and comfortable  Respiratory system: Clear to auscultation. Respiratory effort normal. Cardiovascular system: S1 & S2 heard, RRR.  Gastrointestinal system: Abdomen is nondistended, soft and nontender. No organomegaly or masses felt. Normal bowel sounds heard. Central nervous system: Alert and oriented. Extremities: Symmetric 5 x 5 power.   Data Reviewed: I have personally reviewed following labs and imaging studies  CBC: Recent Labs  Lab 04/23/24 2154 04/24/24 0015 04/24/24 0825  WBC 6.3  --  6.1  NEUTROABS 4.0  --   --   HGB 7.8* 9.2* 7.5*  HCT 24.1* 27.0* 22.7*  MCV 79.3*  --  79.9*  PLT 293  --  276   Basic Metabolic Panel: Recent Labs  Lab 04/23/24 2154 04/24/24 0015 04/24/24 0825  NA 133* 137 138  K 4.6 4.8 3.9  CL 99 102 104  CO2 22  --  22  GLUCOSE 86 95 68*  BUN 6 4* <5*  CREATININE 0.55 0.50 0.44  CALCIUM 10.0  --  9.5  MG 2.0  --   --    GFR: Estimated Creatinine Clearance: 63.1 mL/min (by C-G formula based on SCr of 0.44 mg/dL). Liver  Function Tests: Recent Labs  Lab 04/23/24 2154 04/24/24 0825  AST 293* 264*  ALT 144* 130*  ALKPHOS 123 113  BILITOT 1.1 1.1  PROT 8.1 7.5  ALBUMIN 3.8 3.7   Recent Labs  Lab 04/23/24 2154  LIPASE 28   No results for input(s): AMMONIA in the last 168 hours. Coagulation Profile: Recent Labs  Lab 04/24/24 1717  INR 1.5*   Cardiac Enzymes: No results for input(s): CKTOTAL, CKMB, CKMBINDEX, TROPONINI in the last 168 hours. BNP (last 3 results) No results for input(s): PROBNP in the last 8760 hours. HbA1C: No results for input(s): HGBA1C in the last 72 hours. CBG: Recent Labs  Lab 04/24/24 0943  GLUCAP 123*   Lipid Profile: No results for input(s): CHOL, HDL, LDLCALC, TRIG, CHOLHDL, LDLDIRECT in the last 72 hours. Thyroid  Function Tests: No results for input(s): TSH, T4TOTAL, FREET4, T3FREE, THYROIDAB in the last 72 hours. Anemia Panel: No results for input(s): VITAMINB12, FOLATE, FERRITIN, TIBC, IRON, RETICCTPCT in the last 72 hours. Sepsis Labs: No results for input(s): PROCALCITON, LATICACIDVEN in the last 168 hours.  No results found for this or any previous visit (from the past 240 hours).       Radiology Studies: MR Brain W and Wo Contrast Result Date: 04/24/2024 EXAM: MRI BRAIN WITH AND WITHOUT CONTRAST 04/24/2024 08:49:11 AM TECHNIQUE: Multiplanar multisequence MRI of the head/brain was performed with and without the administration of intravenous contrast. COMPARISON: MRI of the head dated 02/12/2024. CLINICAL HISTORY: Metastatic disease evaluation. Prior 02/12/2024. 5mL Gadavist  given. FINDINGS: BRAIN AND VENTRICLES: No acute infarct. No acute intracranial hemorrhage. No mass effect or midline shift. No hydrocephalus. The sella is unremarkable. Normal flow voids. No mass or abnormal enhancement. ORBITS: No acute abnormality. SINUSES: No acute abnormality. BONES AND SOFT TISSUES: Normal bone marrow signal and  enhancement. No acute soft tissue abnormality. IMPRESSION: 1. No acute intracranial abnormality. 2. No mass or abnormal enhancement. Electronically signed by: Christina Coho MD 04/24/2024 09:00 AM EDT RP Workstation: [id number]   CT ABDOMEN PELVIS W CONTRAST Result Date: 04/24/2024 EXAM: CT ABDOMEN AND PELVIS WITH CONTRAST 04/24/2024 02:31:11 AM TECHNIQUE: CT of the abdomen and pelvis was performed with the administration of 100 mL of iohexol  (OMNIPAQUE ) 300 MG/ML solution. Multiplanar reformatted images are provided for review. Automated exposure control, iterative reconstruction, and/or weight-based adjustment of the mA/kV was utilized to reduce the radiation dose to as low as reasonably achievable. COMPARISON: CT abdomen and pelvis 04/17/2024, CT abdomen and pelvis 09/18/2023. CLINICAL HISTORY: Bowel obstruction suspected. FINDINGS: LOWER CHEST: No acute abnormality. LIVER: Redemonstration of multiple heterogeneous masses of the liver consistent with known multifocal hepatocellular carcinoma with largest lesion measuring up to 16 cm (2: 29). Persistent mass effect on the inferior vena cava from the largest mass. GALLBLADDER AND BILE DUCTS: Gallbladder is unremarkable. No biliary ductal dilatation. SPLEEN: Splenorenal shunt again noted. PANCREAS: No acute abnormality. ADRENAL GLANDS: No acute abnormality. KIDNEYS, URETERS AND BLADDER: No stones in the kidneys or ureters. No hydronephrosis. No perinephric or periureteral stranding. Urinary bladder is unremarkable. GI  AND BOWEL: Stomach demonstrates no acute abnormality. No small or large bowel wall thickening. Appendix is unremarkable. There is no bowel obstruction. PERITONEUM AND RETROPERITONEUM: No ascites. No free air. VASCULATURE: Aorta is normal in caliber. Portal, splenic, superior mesenteric vein are patent. LYMPH NODES: No lymphadenopathy. REPRODUCTIVE ORGANS: Uterus is unremarkable. No adnexal masses. BONES AND SOFT TISSUES: No acute osseous  abnormality. No focal soft tissue abnormality. IMPRESSION: 1. Redemonstration of multifocal hepatocellular carcinoma with largest lesion measuring up to 16 cm, causing persistent mass effect on the inferior vena cava. 2. No evidence of bowel obstruction. Electronically signed by: Morgane Naveau MD 04/24/2024 02:58 AM EDT RP Workstation: [id number]        Scheduled Meds:  polyethylene glycol  17 g Oral Daily   Continuous Infusions:   LOS: 0 days    Time spent: 35 minutes    Neilah Fulwider A Obe Ahlers, MD [facility]   If 7PM-7AM, please contact night-coverage [email or web address]  04/25/2024, 7:23 AM

## 2024-04-25 NOTE — Progress Notes (Signed)
   04/25/24 1653  TOC Brief Assessment  Insurance and Status Lapsed (No active insurance coverage)  Patient has primary care physician Yes  Home environment has been reviewed single family home  Prior level of function: independent  Prior/Current Home Services No current home services  Social Drivers of Health Review SDOH reviewed no interventions necessary  Readmission risk has been reviewed Yes  Transition of care needs transition of care needs identified, TOC will continue to follow    Pt admitted from home, lives with spouse. Pt will start radiation on Wednesday for hepatocellular carcinoma. Pt currently uninsured. TOC will continue to follow for discharge needs.  Signed: Heather Saltness, MSW, LCSW Clinical Social Worker Inpatient Care Management 04/25/2024 4:56 PM

## 2024-04-26 DIAGNOSIS — G893 Neoplasm related pain (acute) (chronic): Secondary | ICD-10-CM

## 2024-04-26 DIAGNOSIS — R112 Nausea with vomiting, unspecified: Secondary | ICD-10-CM

## 2024-04-26 DIAGNOSIS — Z7189 Other specified counseling: Secondary | ICD-10-CM

## 2024-04-26 DIAGNOSIS — C220 Liver cell carcinoma: Secondary | ICD-10-CM

## 2024-04-26 DIAGNOSIS — Z515 Encounter for palliative care: Secondary | ICD-10-CM

## 2024-04-26 MED ORDER — OXYCODONE HCL 5 MG PO TABS
5.0000 mg | ORAL_TABLET | ORAL | Status: DC | PRN
  Filled 2024-04-26: qty 1

## 2024-04-26 MED ORDER — OXYCODONE HCL 5 MG PO TABS
5.0000 mg | ORAL_TABLET | ORAL | Status: DC | PRN
  Administered 2024-04-26 – 2024-04-27 (×4): 5 mg via ORAL
  Filled 2024-04-26 (×3): qty 1

## 2024-04-26 NOTE — Consult Note (Signed)
 Consultation Note Date: 04/26/2024   Patient Name: Christina Gutierrez  DOB: 1976-07-02  MRN: [id number]  Age / Sex: 48 y.o., female   PCP: [name], MD Referring Physician: Madelyne Owen LABOR, MD  Reason for Consultation: Establishing goals of care, Non pain symptom management, and Pain control     Chief Complaint/History of Present Illness:  Christina Gutierrez is a 48 year old female with past medical history of stage III multifocal hepatocellular carcinoma, anemia, chronic cancer pain, prior bowel obstruction, and chronic hepatitis B.  She was recently admitted 10/11-10/13 for abdominal pain and discharged home with plan for outpatient radiation with palliative intent.  She was discharged home with oxycodone  as needed for pain as well as Compazine  and Zofran  as needed for nausea.  She represented to the emergency room on 10/18 with a concern for bowel obstruction due to vomiting, weakness, worsened right upper quadrant pain, and lightheadedness.  CT revealed no signs of bowel obstruction but continued multifocal hepatocellular carcinoma.  She was admitted for intractable nausea and vomiting, hydration, and symptom management.  Palliative care consulted for goals of care and symptom management including nausea and pain control.  Reviewed EMR including recent documentation from hospitalist, radiation oncology, and oncology.  Discussed case in person with referring physician, Dr. Madelyne.  Reviewed labs from 10/20 including hemoglobin of 8.4 which is up from 7.5 the day prior.  Most recent CMP with GFR greater than 60, AST 264, ALT 130, total bilirubin 1.1.  Personally reviewed CT scan with no signs of obstruction but significant hepatic involvement including multifocal hepatocellular disease.  Medication review in the last 24 hours reveals as needed doses of 25 mcg of fentanyl  given around 0230, 0645.  Oxycodone  5 mg given at 0941.  No nausea medication needed in greater than 24 hours.  I  met today with patient and her husband at the bedside.  Primary language is Spanish and [employer] video interpreter used with interpreter [phone].  She reports that she had been feeling well up until early this morning at which point in time she had uncontrolled pain in the right upper quadrant.  At that point in time, pain was 10 out of 10 and sharp in nature.  She received IV medication as well as oral medications this morning and states that it has been helpful in reducing the pain.  Currently, she reports her pain is 4 out of 10.  States this is an acceptable level to her that she can manage.  Unfortunately, at this point she reported she was feeling sick and needed a vomit bag.  She then began having recurrent emesis.  I stepped out of the room for a moment to let RN know so she could get medication and then rejoined patient and her husband room.  She was less engaged in conversation due to acute illness, but she remained polite and answer questions throughout the encounter.  She reports having understanding of plan for radiation at the hope is that it would help reduce her symptoms.  We reviewed the goal of radiation is not to cure her illness but to help with improving quality of life and symptom burden.  We also reviewed surrogate decision making and she reports that in the event she is unable to make her own decisions she would like her husband to serve as her surrogate decision maker.  They have not ACP completed paperwork patient is aware of.  She then began vomiting again.  Discussed that I would like to continue to  explore long term hopes with her, but she is understandably not feeling up to this with her acute nausea/vomiting.  Discussed plan to give anti-nausea medications prior to any further pain medications and revisit with her tomorrow or Thursday (depending on if she goes for radiation tomorrow).    Primary Diagnoses  Present on Admission:  Intractable nausea and vomiting  Anemia of chronic  disease  Hepatocellular carcinoma (HCC)  Transaminitis   Palliative Review of Systems: Pain- stomach and right upper abdomen.  Sharp in nature.  Was 10 out of 10 this morning and she received 2 doses of 25 mcg of fentanyl  that she reports was helpful.  Her pain is currently 10 out of 4 after oxycodone . Poor appetite Nausea-initial presenting symptom.  Had improved yesterday but worsened again after receiving pain medication this morning.  Acute vomiting during my encounter and Zofran  given.  Past Medical History:  Diagnosis Date   Cancer (HCC)    Hepatocellular carcinoma (HCC) 08/22/2023   Metabolic acidosis 08/21/2023   Social History   Socioeconomic History   Marital status: Married    Spouse name: Not on file   Number of children: Not on file   Years of education: Not on file   Highest education level: Not on file  Occupational History   Not on file  Tobacco Use   Smoking status: Never   Smokeless tobacco: Never  Vaping Use   Vaping status: Never Used  Substance and Sexual Activity   Alcohol use: No   Drug use: No   Sexual activity: Not Currently  Other Topics Concern   Not on file  Social History Narrative   Not on file   Social Drivers of Health   Financial Resource Strain: Not on file  Food Insecurity: No Food Insecurity (04/24/2024)   Hunger Vital Sign    Worried About Running Out of Food in the Last Year: Never true    Ran Out of Food in the Last Year: Never true  Transportation Needs: No Transportation Needs (04/24/2024)   PRAPARE - Administrator, Civil Service (Medical): No    Lack of Transportation (Non-Medical): No  Physical Activity: Not on file  Stress: Not on file  Social Connections: Patient Declined (08/29/2023)   Social Connection and Isolation Panel    Frequency of Communication with Friends and Family: Patient declined    Frequency of Social Gatherings with Friends and Family: Patient declined    Attends Religious Services:  Patient declined    Database administrator or Organizations: Patient declined    Attends Banker Meetings: Patient declined    Marital Status: Patient declined   History reviewed. No pertinent family history. Scheduled Meds:  feeding supplement  237 mL Oral BID BM   pantoprazole   40 mg Oral Q0600   polyethylene glycol  17 g Oral BID   senna-docusate  1 tablet Oral BID   tenofovir  alafenamide  25 mg Oral Daily   Continuous Infusions: PRN Meds:.acetaminophen  **OR** acetaminophen , ALPRAZolam , dextrose , fentaNYL  (SUBLIMAZE ) injection, magnesium  citrate, melatonin, metoCLOPramide  (REGLAN ) injection, ondansetron  (ZOFRAN ) IV, oxyCODONE , SMOG No Known Allergies CBC:    Component Value Date/Time   WBC 5.9 04/25/2024 1030   HGB 8.4 (L) 04/25/2024 1030   HGB 7.2 (L) 04/08/2024 0740   HCT 26.0 (L) 04/25/2024 1030   PLT 348 04/25/2024 1030   PLT 263 04/08/2024 0740   MCV 81.5 04/25/2024 1030   NEUTROABS 4.0 04/23/2024 2154   LYMPHSABS 1.2 04/23/2024 2154  MONOABS 0.5 04/23/2024 2154   EOSABS 0.5 04/23/2024 2154   BASOSABS 0.1 04/23/2024 2154   Comprehensive Metabolic Panel:    Component Value Date/Time   NA 138 04/24/2024 0825   K 3.9 04/24/2024 0825   CL 104 04/24/2024 0825   CO2 22 04/24/2024 0825   BUN <5 (L) 04/24/2024 0825   CREATININE 0.44 04/24/2024 0825   CREATININE 0.60 04/08/2024 0740   GLUCOSE 68 (L) 04/24/2024 0825   CALCIUM 9.5 04/24/2024 0825   AST 264 (H) 04/24/2024 0825   AST 219 (HH) 04/08/2024 0740   ALT 130 (H) 04/24/2024 0825   ALT 104 (H) 04/08/2024 0740   ALKPHOS 113 04/24/2024 0825   BILITOT 1.1 04/24/2024 0825   BILITOT 0.7 04/08/2024 0740   PROT 7.5 04/24/2024 0825   ALBUMIN 3.7 04/24/2024 0825    Physical Exam: Vital Signs: BP 103/68 (BP Location: Left Arm)   Pulse 71   Temp 98.2 F (36.8 C) (Oral)   Resp 18   Wt 49.7 kg   SpO2 98%   BMI 21.40 kg/m  SpO2: SpO2: 98 % O2 Device: O2 Device: Room Air O2 Flow Rate:    Intake/output summary:  Intake/Output Summary (Last 24 hours) at 04/26/2024 1405 Last data filed at 04/26/2024 1000 Gross per 24 hour  Intake 2055 ml  Output 0 ml  Net 2055 ml   LBM: Last BM Date : 04/25/24 Baseline Weight: Weight: 49.7 kg Most recent weight: Weight: 49.7 kg  General: alert, no distress until began vomiting halfway through encounter Eyes: conjunctiva clear, anicteric sclera HENT: normocephalic, atraumatic, moist mucous membranes Cardiovascular: RRR, no edema in LE b/l Respiratory: no increased work of breathing noted, not in respiratory distress Abdomen: not distended, TTP RUQ Skin: no rashes or lesions on visible skin Neuro: A&Ox4, following commands easily Psych: appropriately answers all questions          Additional Data Reviewed: Recent Labs    04/24/24 0015 04/24/24 0825 04/25/24 1030  WBC  --  6.1 5.9  HGB 9.2* 7.5* 8.4*  PLT  --  276 348  NA 137 138  --   BUN 4* <5*  --   CREATININE 0.50 0.44  --     Imaging: MR Brain W and Wo Contrast EXAM: MRI BRAIN WITH AND WITHOUT CONTRAST 04/24/2024 08:49:11 AM  TECHNIQUE: Multiplanar multisequence MRI of the head/brain was performed with and without the administration of intravenous contrast.  COMPARISON: MRI of the head dated 02/12/2024.  CLINICAL HISTORY: Metastatic disease evaluation. Prior 02/12/2024. 5mL Gadavist  given.  FINDINGS:  BRAIN AND VENTRICLES: No acute infarct. No acute intracranial hemorrhage. No mass effect or midline shift. No hydrocephalus. The sella is unremarkable. Normal flow voids. No mass or abnormal enhancement.  ORBITS: No acute abnormality.  SINUSES: No acute abnormality.  BONES AND SOFT TISSUES: Normal bone marrow signal and enhancement. No acute soft tissue abnormality.  IMPRESSION: 1. No acute intracranial abnormality. 2. No mass or abnormal enhancement.  Electronically signed by: Evalene Coho MD 04/24/2024 09:00 AM EDT RP Workstation:  [id number] CT ABDOMEN PELVIS W CONTRAST EXAM: CT ABDOMEN AND PELVIS WITH CONTRAST 04/24/2024 02:31:11 AM  TECHNIQUE: CT of the abdomen and pelvis was performed with the administration of 100 mL of iohexol  (OMNIPAQUE ) 300 MG/ML solution. Multiplanar reformatted images are provided for review. Automated exposure control, iterative reconstruction, and/or weight-based adjustment of the mA/kV was utilized to reduce the radiation dose to as low as reasonably achievable.  COMPARISON: CT abdomen and pelvis 04/17/2024, CT abdomen  and pelvis 09/18/2023.  CLINICAL HISTORY: Bowel obstruction suspected.  FINDINGS:  LOWER CHEST: No acute abnormality.  LIVER: Redemonstration of multiple heterogeneous masses of the liver consistent with known multifocal hepatocellular carcinoma with largest lesion measuring up to 16 cm (2: 29). Persistent mass effect on the inferior vena cava from the largest mass.  GALLBLADDER AND BILE DUCTS: Gallbladder is unremarkable. No biliary ductal dilatation.  SPLEEN: Splenorenal shunt again noted.  PANCREAS: No acute abnormality.  ADRENAL GLANDS: No acute abnormality.  KIDNEYS, URETERS AND BLADDER: No stones in the kidneys or ureters. No hydronephrosis. No perinephric or periureteral stranding. Urinary bladder is unremarkable.  GI AND BOWEL: Stomach demonstrates no acute abnormality. No small or large bowel wall thickening. Appendix is unremarkable. There is no bowel obstruction.  PERITONEUM AND RETROPERITONEUM: No ascites. No free air.  VASCULATURE: Aorta is normal in caliber. Portal, splenic, superior mesenteric vein are patent.  LYMPH NODES: No lymphadenopathy.  REPRODUCTIVE ORGANS: Uterus is unremarkable. No adnexal masses.  BONES AND SOFT TISSUES: No acute osseous abnormality. No focal soft tissue abnormality.  IMPRESSION: 1. Redemonstration of multifocal hepatocellular carcinoma with largest lesion measuring up to 16 cm, causing  persistent mass effect on the inferior vena cava. 2. No evidence of bowel obstruction.  Electronically signed by: Morgane Naveau MD 04/24/2024 02:58 AM EDT RP Workstation: [id number]    I personally reviewed recent imaging.   Palliative Care Assessment and Plan Summary of Established Goals of Care and Medical Treatment Preferences  Christina Gutierrez is a 48 year old female with past medical history of stage III multifocal hepatocellular carcinoma, anemia, chronic cancer pain, prior bowel obstruction, and chronic hepatitis B.  She was recently admitted 10/11-10/13 for abdominal pain and discharged home with plan for outpatient radiation with palliative intent.  She was discharged home with oxycodone  as needed for pain as well as Compazine  and Zofran  as needed for nausea.  She represented to the emergency room on 10/18 with a concern for bowel obstruction due to vomiting, weakness, worsened right upper quadrant pain, and lightheadedness.  CT revealed no signs of bowel obstruction but continued multifocal hepatocellular carcinoma.  She was admitted for intractable nausea and vomiting, hydration, and symptom management.  Palliative care consulted for goals of care and symptom management including nausea and pain control.  # Complex medical decision making/goals of care  -Patient with understanding that goal of treatment is to improve symptoms and quality of life.  She is invested in plan to continue with radiation therapy with palliative intent as previously planned. - She has stated that she would like for her husband Christina Plaza) to serve as her Runner, broadcasting/film/video.  Will consider completion of HCPOA documentation.       -  Code Status: Full Code- did not further discuss today as acutely vomiting during encounter  Prognosis: Unable to determine  # Symptom management Patient is receiving these palliative interventions for symptom management with an intent to improve quality of life.   - Pain,  cancer related: large amount of liver involvement.  Continue oxycodone  which has been effective, but recommend pretreat with antiemetic when receiving pain medication.  Continue fentanyl  prn if oxycodone  ineffective, however, preferential treatment with oxycodone  with hope of discharge home soon.  With large amount of capsular distention, she is likely to benefit from planned radiation and/or ablation therapy.  Could consider steroids in the interim if oncology and rad oncology agree.  - Nausea: zofran  or reglan  as needed.  Recommend pretreat prior to giving oral opioids.  If Zofran  is ineffective, she may benefit from using Reglan  which would have more of a dopamanergic component.  # Psycho-social/Spiritual Support:  - Support System: Husband and children   - Desire for further Chaplain support:no  # Discharge Planning:  Home with plan for radiation therapy and OP palliative care follow-up.  Thank you for allowing the palliative care team to participate in the care Christina Gutierrez.  Amaryllis Meissner, MD Palliative Care Provider PMT # [phone]  I personally spent a total of 90 minutes in the care of the patient today including preparing to see the patient, getting/reviewing separately obtained history, performing a medically appropriate exam/evaluation, counseling and educating, referring and communicating with other health care professionals, documenting clinical information in the EHR, independently interpreting results, and coordinating care.   If patient remains symptomatic despite maximum doses, please call PMT at [phone] between 0700 and 1900. Outside of these hours, please call attending, as PMT does not have night coverage.   Billing based on MDM: High  Problems Addressed: One or more chronic illnesses with severe exacerbation, progression, or side effects of treatment.  Amount and/or Complexity of Data: Category 1:Review of prior external note(s) from each unique source  and Review of the result(s) of each unique test, Category 2:Independent interpretation of a test performed by another physician/other qualified health care professional (not separately reported), and Category 3:Discussion of management or test interpretation with external physician/other qualified health care professional/appropriate source (not separately reported)  Risks: Parenteral controlled substances and Decision regarding hospitalization or escalation of hospital care

## 2024-04-26 NOTE — Progress Notes (Signed)
 PROGRESS NOTE    Christina Gutierrez  FMW:[id number] DOB: 08-19-1975 DOA: 04/23/2024 PCP: [name], MD   Brief Narrative: 48 year old with past medical history significant for chronic hepatitis B, hepatocellular carcinoma, anemia of chronic disease, chronic cancer associated pain who presents complaining of generalized weakness, associated dizziness, nausea and vomiting.  CT abdomen and pelvis with contrast showed multifocal hepatocellular carcinoma largest lesion measuring up to 16 cm calcium persistent mass effect in the inferior vena cava.   Assessment & Plan:   Principal Problem:   Intractable nausea and vomiting Active Problems:   Transaminitis   Hepatocellular carcinoma (HCC)   Anemia of chronic disease   1-Intractable nausea vomiting, - Suspect related to malignancy, also could have been related to constipation. - Continue with supportive care, - CT abdomen and pelvis redemonstration of multifocal hepatocellular carcinoma with largest lesion measuring up to 16 cm calcium persistent mass effect on the inferior vena cava.  Negative for bowel obstruction - Continue MiraLAX , as needed Zofran  and Reglan  She is not feeling better today, she vomited this morning.  She also had worsening abdominal pain overnight.  She does not know if it was something that she ate. - Plan to remain hospitalized today, continue with supportive care  Hepatocellular carcinoma Transaminases History of Hepatitis B.  -Patient did not tolerated atezolizumab , last dose given 8/20 to 02/24/2024.  She developed significant fatigue and decreased appetite after treatment. - Case was discussed with GI tumor conference and decision was made to proceed with palliative radiation and IR guided ablation - Patient underwent CT simulation 10/15. - Patient's follow-up with Dr. [name] -Discussed with Isaiah Husband, PA with radiation oncology, patient will start radiation tomorrow at 12:25  PM. -Continue  tenofovir  Plan to start treatment while hospitalized She agreed to speak with palliative care today for pain management.  Anemia of chronic disease - Hemoglobin is stable  Dizziness: MRI brain negative  Estimated body mass index is 21.4 kg/m as calculated from the following:   Height as of 04/16/24: 5' (1.524 m).   Weight as of this encounter: 49.7 kg.   DVT prophylaxis: SCDs Code Status: Full code Family Communication: Care discussed with patient Disposition Plan:  Status is: Observation The patient remains OBS appropriate and will d/c before 2 midnights.    Consultants:  None  Procedures:  none  Antimicrobials:    Subjective: She is not feeling well today, she did not had a very good night.  She developed worsening pain last night.  She does not know if he was fried egg that she ate.  Objective: Vitals:   04/25/24 1554 04/25/24 2241 04/26/24 0500 04/26/24 0624  BP: 116/68 119/63  116/75  Pulse: 79 80  82  Resp:  17  16  Temp: 98 F (36.7 C) 98.3 F (36.8 C)  97.7 F (36.5 C)  TempSrc: Oral Oral  Oral  SpO2: 95% 100%  99%  Weight:   49.7 kg     Intake/Output Summary (Last 24 hours) at 04/26/2024 0936 Last data filed at 04/26/2024 0600 Gross per 24 hour  Intake 2295 ml  Output 0 ml  Net 2295 ml   Filed Weights   04/26/24 0500  Weight: 49.7 kg    Examination:  General exam: NAD Respiratory system: CTA Cardiovascular system: SS 1, S 2 RRR Gastrointestinal system: BS present, soft, nt Central nervous system: alert Extremities:no edema.   Data Reviewed: I have personally reviewed following labs and imaging studies  CBC: Recent Labs  Lab  04/23/24 2154 04/24/24 0015 04/24/24 0825 04/25/24 1030  WBC 6.3  --  6.1 5.9  NEUTROABS 4.0  --   --   --   HGB 7.8* 9.2* 7.5* 8.4*  HCT 24.1* 27.0* 22.7* 26.0*  MCV 79.3*  --  79.9* 81.5  PLT 293  --  276 348   Basic Metabolic Panel: Recent Labs  Lab 04/23/24 2154 04/24/24 0015  04/24/24 0825  NA 133* 137 138  K 4.6 4.8 3.9  CL 99 102 104  CO2 22  --  22  GLUCOSE 86 95 68*  BUN 6 4* <5*  CREATININE 0.55 0.50 0.44  CALCIUM 10.0  --  9.5  MG 2.0  --   --    GFR: Estimated Creatinine Clearance: 63.1 mL/min (by C-G formula based on SCr of 0.44 mg/dL). Liver Function Tests: Recent Labs  Lab 04/23/24 2154 04/24/24 0825  AST 293* 264*  ALT 144* 130*  ALKPHOS 123 113  BILITOT 1.1 1.1  PROT 8.1 7.5  ALBUMIN 3.8 3.7   Recent Labs  Lab 04/23/24 2154  LIPASE 28   No results for input(s): AMMONIA in the last 168 hours. Coagulation Profile: Recent Labs  Lab 04/24/24 1717  INR 1.5*   Cardiac Enzymes: No results for input(s): CKTOTAL, CKMB, CKMBINDEX, TROPONINI in the last 168 hours. BNP (last 3 results) No results for input(s): PROBNP in the last 8760 hours. HbA1C: No results for input(s): HGBA1C in the last 72 hours. CBG: Recent Labs  Lab 04/24/24 0943  GLUCAP 123*   Lipid Profile: No results for input(s): CHOL, HDL, LDLCALC, TRIG, CHOLHDL, LDLDIRECT in the last 72 hours. Thyroid  Function Tests: No results for input(s): TSH, T4TOTAL, FREET4, T3FREE, THYROIDAB in the last 72 hours. Anemia Panel: No results for input(s): VITAMINB12, FOLATE, FERRITIN, TIBC, IRON, RETICCTPCT in the last 72 hours. Sepsis Labs: No results for input(s): PROCALCITON, LATICACIDVEN in the last 168 hours.  No results found for this or any previous visit (from the past 240 hours).       Radiology Studies: No results found.       Scheduled Meds:  feeding supplement  237 mL Oral BID BM   pantoprazole   40 mg Oral Q0600   polyethylene glycol  17 g Oral BID   senna-docusate  1 tablet Oral BID   tenofovir  alafenamide  25 mg Oral Daily   Continuous Infusions:   LOS: 0 days    Time spent: 35 minutes    Takeisha Cianci A Sundai Probert, MD [facility]   If 7PM-7AM, please contact  night-coverage [email or web address]  04/26/2024, 9:36 AM

## 2024-04-27 ENCOUNTER — Ambulatory Visit: Payer: Self-pay | Admitting: Radiation Oncology

## 2024-04-27 ENCOUNTER — Encounter (HOSPITAL_COMMUNITY): Payer: Self-pay | Admitting: Oncology

## 2024-04-27 ENCOUNTER — Encounter: Payer: Self-pay | Admitting: Oncology

## 2024-04-27 ENCOUNTER — Other Ambulatory Visit: Payer: Self-pay

## 2024-04-27 ENCOUNTER — Other Ambulatory Visit (HOSPITAL_COMMUNITY): Payer: Self-pay

## 2024-04-27 LAB — RAD ONC ARIA SESSION SUMMARY
Course Elapsed Days: 0
Plan Fractions Treated to Date: 1
Plan Prescribed Dose Per Fraction: 3 Gy
Plan Total Fractions Prescribed: 7
Plan Total Prescribed Dose: 21 Gy
Reference Point Dosage Given to Date: 3 Gy
Reference Point Session Dosage Given: 3 Gy
Session Number: 1

## 2024-04-27 LAB — BASIC METABOLIC PANEL WITH GFR
Anion gap: 12 (ref 5–15)
BUN: 7 mg/dL (ref 6–20)
CO2: 24 mmol/L (ref 22–32)
Calcium: 10 mg/dL (ref 8.9–10.3)
Chloride: 102 mmol/L (ref 98–111)
Creatinine, Ser: 0.52 mg/dL (ref 0.44–1.00)
GFR, Estimated: >60 mL/min (ref >60)
Glucose, Bld: 68 mg/dL — ABNORMAL LOW (ref 70–99)
Potassium: 4.1 mmol/L (ref 3.5–5.1)
Sodium: 137 mmol/L (ref 135–145)

## 2024-04-27 LAB — CBC
HCT: 24.5 % — ABNORMAL LOW (ref 36.0–46.0)
Hemoglobin: 7.9 g/dL — ABNORMAL LOW (ref 12.0–15.0)
MCH: 26.1 pg (ref 26.0–34.0)
MCHC: 32.2 g/dL (ref 30.0–36.0)
MCV: 80.9 fL (ref 80.0–100.0)
Platelets: 308 K/uL (ref 150–400)
RBC: 3.03 MIL/uL — ABNORMAL LOW (ref 3.87–5.11)
RDW: 23.6 % — ABNORMAL HIGH (ref 11.5–15.5)
WBC: 5.6 K/uL (ref 4.0–10.5)
nRBC: 0 % (ref 0.0–0.2)

## 2024-04-27 MED ORDER — ONDANSETRON HCL 8 MG PO TABS
8.0000 mg | ORAL_TABLET | Freq: Three times a day (TID) | ORAL | 0 refills | Status: DC | PRN
  Filled 2024-04-27: qty 60, 20d supply, fill #0

## 2024-04-27 MED ORDER — OXYCODONE HCL 5 MG/5ML PO SOLN
5.0000 mg | ORAL | 0 refills | Status: AC | PRN
  Filled 2024-04-27: qty 100, 4d supply, fill #0

## 2024-04-27 MED ORDER — PROCHLORPERAZINE MALEATE 10 MG PO TABS
10.0000 mg | ORAL_TABLET | Freq: Four times a day (QID) | ORAL | 1 refills | Status: DC | PRN
  Filled 2024-04-27: qty 30, 8d supply, fill #0

## 2024-04-27 NOTE — Progress Notes (Signed)
 Daily Progress Note   Patient Name: Christina Gutierrez       Date: 04/27/2024 DOB: 1976-04-26  Age: 48 y.o. MRN#: [id number] Attending Physician: Madelyne Owen LABOR, MD Primary Care Physician: [name], MD Admit Date: 04/23/2024  Reason for Consultation/Follow-up: Pain control  Patient Profile/HPI: Christina Gutierrez is a 48 year old female with past medical history of stage III multifocal hepatocellular carcinoma, anemia, chronic cancer pain, prior bowel obstruction, and chronic hepatitis B.  She was recently admitted 10/11-10/13 for abdominal pain and discharged home with plan for outpatient radiation with palliative intent.  She was discharged home with oxycodone  as needed for pain as well as Compazine  and Zofran  as needed for nausea.  She represented to the emergency room on 10/18 with a concern for bowel obstruction due to vomiting, weakness, worsened right upper quadrant pain, and lightheadedness.  CT revealed no signs of bowel obstruction but continued multifocal hepatocellular carcinoma.  She was admitted for intractable nausea and vomiting, hydration, and symptom management.  Palliative care consulted for goals of care and symptom management including nausea and pain control.    Subjective: Chart reviewed including labs, progress notes, imaging from this and previous encounters.  Patient has not used IV fentanyl  in last 24 hours. Has had 15 mg oxycodone .  Reglan  10mg  IV x1 ondansetron  4mg  IV x 1.  I met with her using interpreter service charlestine Blade [phone]. Patient reports pain is better, however, she is currently nauseated and requesting nausea medication. RN at bedside administering medication.  She notes that she completed radiation therapy today and feels this brought on  her nausea.  She had pain medication approx an hour ago- yesterday it was noted that pain medication brought on nausea and recommended to take nausea medication along with pain medication as prophylaxis. She is hopeful to discharge home soon.      Physical Exam Vitals and nursing note reviewed.  Constitutional:      Appearance: She is ill-appearing.  Pulmonary:     Effort: Pulmonary effort is normal.  Neurological:     Mental Status: She is alert and oriented to person, place, and time.             Vital Signs: BP 130/72   Pulse 82   Temp 97.7 F (36.5 C) (Oral)   Resp 15  Wt 50 kg   SpO2 97%   BMI 21.53 kg/m  SpO2: SpO2: 97 % O2 Device: O2 Device: Room Air O2 Flow Rate:    Intake/output summary:  Intake/Output Summary (Last 24 hours) at 04/27/2024 1515 Last data filed at 04/27/2024 1400 Gross per 24 hour  Intake 720 ml  Output 100 ml  Net 620 ml   LBM: Last BM Date : 04/25/24 Baseline Weight: Weight: 49.7 kg Most recent weight: Weight: 50 kg         Patient Active Problem List   Diagnosis Date Noted   Intractable nausea and vomiting 04/24/2024   Anemia of chronic disease 04/17/2024   Palliative care encounter 04/17/2024   Medication management 04/17/2024   Counseling and coordination of care 04/17/2024   Goals of care, counseling/discussion 04/17/2024   Abdominal pain 03/17/2024   Dehydration 03/14/2024   Gingival bleeding 11/13/2023   Port-A-Cath in place 10/23/2023   Situational insomnia 10/02/2023   Cancer associated pain 08/28/2023   Portal venous hypertension (HCC) 08/23/2023   Intractable pain 08/23/2023   Hepatocellular carcinoma (HCC) 08/22/2023   Hepatic failure (HCC) 08/21/2023   Normocytic anemia 08/21/2023   Transaminitis 08/21/2023   Disseminated varicella 08/21/2023   Hyperammonemia 08/21/2023   Chronic viral hepatitis B without delta-agent (HCC) 08/21/2023    Palliative Care Assessment & Plan     Assessment/Recommendations/Plan  Continue po oxycodone  5mg  for pain- encouraged to take nausea medication with pain medication- may need to rotate opioid if nausea persists Continue IV fentanyl  for severe pain   Code Status:   Code Status: Full Code   Prognosis:  Unable to determine  Discharge Planning: To Be Determined  Care plan was discussed with patient and care team  Thank you for allowing the Palliative Medicine Team to assist in the care of this patient.  Total time:  Prolonged billing:  Time includes:   Preparing to see the patient (e.g., review of tests) Obtaining and/or reviewing separately obtained history Performing a medically necessary appropriate examination and/or evaluation Counseling and educating the patient/family/caregiver Ordering medications, tests, or procedures Referring and communicating with other health care professionals (when not reported separately) Documenting clinical information in the electronic or other health record Independently interpreting results (not reported separately) and communicating results to the patient/family/caregiver Care coordination (not reported separately) Clinical documentation  Cassondra Stain, AGNP-C Palliative Medicine   Please contact Palliative Medicine Team phone at [phone] for questions and concerns.

## 2024-04-27 NOTE — Progress Notes (Signed)
 Discharge instructions given to patients son who translated, no questions at this time.

## 2024-04-27 NOTE — Discharge Summary (Signed)
 Physician Discharge Summary  Christina Gutierrez FMW:[id number] DOB: 12/02/1975 DOA: 04/23/2024  PCP: [name], MD  Admit date: 04/23/2024 Discharge date: 04/27/2024  Admitted From: Home Disposition: Home  Recommendations for Outpatient Follow-up:  Follow up with PCP in 1-2 weeks Follow-up with oncology as scheduled  Home Health: None Equipment/Devices: None  Discharge Condition: Stable CODE STATUS: Full Diet recommendation: As tolerated  Brief/Interim Summary: 48 year old with past medical history significant for chronic hepatitis B, hepatocellular carcinoma, anemia of chronic disease, chronic cancer associated pain who presents complaining of generalized weakness, associated dizziness, nausea and vomiting.  CT abdomen and pelvis with contrast showed multifocal hepatocellular carcinoma largest lesion measuring up to 16 cm calcium persistent mass effect in the inferior vena cava.  Patient admitted as below with acute intractable nausea vomiting likely related to malignancy versus medical regimen.  Patient CT abdomen pelvis without acute findings, notable multifocal hepatocellular carcinoma, however this is a known diagnosis.  Patient improved drastically on Zofran , Reglan  tolerating p.o. today without any further episodes of nausea vomiting is otherwise stable and agreeable for discharge home.  Close follow-up with PCP and oncology as scheduled.    Discharge Diagnoses:  Principal Problem:   Intractable nausea and vomiting Active Problems:   Transaminitis   Hepatocellular carcinoma (HCC)   Anemia of chronic disease  Intractable nausea vomiting, resolved - Suspect related to malignancy, also could have been related to constipation. - CT abdomen and pelvis redemonstration of multifocal hepatocellular carcinoma with largest lesion measuring up to 16 cm calcium persistent mass effect on the inferior vena cava.  Negative for bowel obstruction   Hepatocellular  carcinoma Transaminases History of Hepatitis B.  -Patient did not tolerated atezolizumab , last dose given 8/20 to 02/24/2024.  She developed significant fatigue and decreased appetite after treatment. - Case was discussed with GI tumor conference and decision was made to proceed with palliative radiation and IR guided ablation - Patient underwent CT simulation 10/15. - Patient's follow-up with Dr. [name] -Discussed with Isaiah Husband, PA with radiation oncology, patient will start radiation tomorrow at 12:25 PM. -Continue  tenofovir  Plan to start treatment while hospitalized She agreed to speak with palliative care today for pain management.   Anemia of chronic disease - Hemoglobin is stable   Dizziness: MRI brain negative   Estimated body mass index is 21.4 kg/m as calculated from the following:   Height as of 04/16/24: 5' (1.524 m).   Weight as of this encounter: 49.7 kg.    Discharge Instructions  Discharge Instructions     Amb Referral to Palliative Care   Complete by: As directed    Call MD for:  difficulty breathing, headache or visual disturbances   Complete by: As directed    Call MD for:  extreme fatigue   Complete by: As directed    Call MD for:  hives   Complete by: As directed    Call MD for:  persistant dizziness or light-headedness   Complete by: As directed    Call MD for:  persistant nausea and vomiting   Complete by: As directed    Call MD for:  severe uncontrolled pain   Complete by: As directed    Call MD for:  temperature >100.4   Complete by: As directed    Diet - low sodium heart healthy   Complete by: As directed    Increase activity slowly   Complete by: As directed       Allergies as of 04/27/2024   No  Known Allergies      Medication List     TAKE these medications    ALPRAZolam  0.5 MG tablet Commonly known as: XANAX  Take 1 tablet (0.5 mg total) by mouth at bedtime as needed for anxiety.   lidocaine -prilocaine   cream Commonly known as: EMLA  Apply 1 Application topically as needed.   ondansetron  8 MG tablet Commonly known as: ZOFRAN  Take 1 tablet (8 mg total) by mouth every 8 (eight) hours as needed for nausea or vomiting.   oxyCODONE  5 MG/5ML solution Commonly known as: ROXICODONE  Take 5 mLs (5 mg total) by mouth every 4 (four) hours as needed for up to 7 days for moderate pain (pain score 4-6) or severe pain (pain score 7-10).   pantoprazole  40 MG tablet Commonly known as: PROTONIX  Take 1 tablet (40 mg total) by mouth daily at 6 (six) AM.   polyethylene glycol powder 17 GM/SCOOP powder Commonly known as: GLYCOLAX /MIRALAX  Take 17 g by mouth daily. Dissolve 1 capful (17g) in 4-8 ounces of liquid and take by mouth daily. What changed:  when to take this reasons to take this additional instructions   prochlorperazine  10 MG tablet Commonly known as: COMPAZINE  Take 1 tablet (10 mg total) by mouth every 6 (six) hours as needed for nausea or vomiting.   Stool Softener/Laxative 50-8.6 MG tablet Generic drug: senna-docusate Take 1 tablet by mouth 2 (two) times daily. What changed:  when to take this reasons to take this   Vemlidy  25 MG tablet Generic drug: tenofovir  alafenamide Tome 1 tableta (25 mg en total) por va oral diariamente. (Take 1 tablet (25 mg total) by mouth daily.)        No Known Allergies  Consultations: Palliative care   Procedures/Studies: MR Brain W and Wo Contrast Result Date: 04/24/2024 EXAM: MRI BRAIN WITH AND WITHOUT CONTRAST 04/24/2024 08:49:11 AM TECHNIQUE: Multiplanar multisequence MRI of the head/brain was performed with and without the administration of intravenous contrast. COMPARISON: MRI of the head dated 02/12/2024. CLINICAL HISTORY: Metastatic disease evaluation. Prior 02/12/2024. 5mL Gadavist  given. FINDINGS: BRAIN AND VENTRICLES: No acute infarct. No acute intracranial hemorrhage. No mass effect or midline shift. No hydrocephalus. The sella is  unremarkable. Normal flow voids. No mass or abnormal enhancement. ORBITS: No acute abnormality. SINUSES: No acute abnormality. BONES AND SOFT TISSUES: Normal bone marrow signal and enhancement. No acute soft tissue abnormality. IMPRESSION: 1. No acute intracranial abnormality. 2. No mass or abnormal enhancement. Electronically signed by: Evalene Coho MD 04/24/2024 09:00 AM EDT RP Workstation: [id number]   CT ABDOMEN PELVIS W CONTRAST Result Date: 04/24/2024 EXAM: CT ABDOMEN AND PELVIS WITH CONTRAST 04/24/2024 02:31:11 AM TECHNIQUE: CT of the abdomen and pelvis was performed with the administration of 100 mL of iohexol  (OMNIPAQUE ) 300 MG/ML solution. Multiplanar reformatted images are provided for review. Automated exposure control, iterative reconstruction, and/or weight-based adjustment of the mA/kV was utilized to reduce the radiation dose to as low as reasonably achievable. COMPARISON: CT abdomen and pelvis 04/17/2024, CT abdomen and pelvis 09/18/2023. CLINICAL HISTORY: Bowel obstruction suspected. FINDINGS: LOWER CHEST: No acute abnormality. LIVER: Redemonstration of multiple heterogeneous masses of the liver consistent with known multifocal hepatocellular carcinoma with largest lesion measuring up to 16 cm (2: 29). Persistent mass effect on the inferior vena cava from the largest mass. GALLBLADDER AND BILE DUCTS: Gallbladder is unremarkable. No biliary ductal dilatation. SPLEEN: Splenorenal shunt again noted. PANCREAS: No acute abnormality. ADRENAL GLANDS: No acute abnormality. KIDNEYS, URETERS AND BLADDER: No stones in the kidneys or ureters. No hydronephrosis.  No perinephric or periureteral stranding. Urinary bladder is unremarkable. GI AND BOWEL: Stomach demonstrates no acute abnormality. No small or large bowel wall thickening. Appendix is unremarkable. There is no bowel obstruction. PERITONEUM AND RETROPERITONEUM: No ascites. No free air. VASCULATURE: Aorta is normal in caliber. Portal, splenic,  superior mesenteric vein are patent. LYMPH NODES: No lymphadenopathy. REPRODUCTIVE ORGANS: Uterus is unremarkable. No adnexal masses. BONES AND SOFT TISSUES: No acute osseous abnormality. No focal soft tissue abnormality. IMPRESSION: 1. Redemonstration of multifocal hepatocellular carcinoma with largest lesion measuring up to 16 cm, causing persistent mass effect on the inferior vena cava. 2. No evidence of bowel obstruction. Electronically signed by: Morgane Naveau MD 04/24/2024 02:58 AM EDT RP Workstation: [id number]   CT ABDOMEN PELVIS W CONTRAST Result Date: 04/17/2024 CLINICAL DATA:  Epigastric pain EXAM: CT ABDOMEN AND PELVIS WITH CONTRAST TECHNIQUE: Multidetector CT imaging of the abdomen and pelvis was performed using the standard protocol following bolus administration of intravenous contrast. RADIATION DOSE REDUCTION: This exam was performed according to the departmental dose-optimization program which includes automated exposure control, adjustment of the mA and/or kV according to patient size and/or use of iterative reconstruction technique. CONTRAST:  OMNIPAQUE  IOHEXOL  300 MG/ML  SOLN COMPARISON:  03/06/2024 FINDINGS: Lower chest: No acute abnormality. Hepatobiliary: Liver again demonstrates multiple heterogeneous mass lesions similar to that seen on the most recent CT examination consistent with the given clinical history of multifocal hepatocellular carcinoma. The largest of these measures 16.4 cm in greatest dimension. This causes significant mass effect upon the hepatic hilar vasculature as well as the IVC. These changes are stable in appearance. Gallbladder is partially distended with evidence of cholelithiasis. No complicating factors are seen. Pancreas: Unremarkable. No pancreatic ductal dilatation or surrounding inflammatory changes. Spleen: Normal in size without focal abnormality. Adrenals/Urinary Tract: Adrenal glands are within normal limits. Kidneys demonstrate a normal  enhancement pattern. No renal calculi or obstructive changes are seen. Stable right renal cyst is noted. No follow-up is recommended. Bladder is partially distended. Stomach/Bowel: No obstructive or inflammatory changes of the colon are seen. The appendix is not well visualized. Small bowel and stomach are within normal limits. No inflammatory changes to suggest appendicitis are noted. Vascular/Lymphatic: Aortic atherosclerosis. No enlarged abdominal or pelvic lymph nodes. 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 Reproductive: Uterus and bilateral adnexa are unremarkable. Other: No free fluid is noted within the pelvis. Musculoskeletal: No acute or significant osseous findings. IMPRESSION: Changes consistent with the known history of multifocal hepatocellular carcinoma. Previously seen small bowel obstruction has resolved. Splenorenal shunt stable from the prior study. Cholelithiasis without complicating factors. Electronically Signed   By: Oneil Devonshire M.D.   On: 04/17/2024 00:29   DG Chest 2 View Result Date: 04/16/2024 CLINICAL DATA:  Shortness of breath. Receiving chemotherapy for liver cancer. EXAM: CHEST - 2 VIEW COMPARISON:  January 15, 2013 FINDINGS: A right-sided venous Port-A-Cath is seen with its distal tip noted at the junction of the superior vena cava and right atrium. The heart size and mediastinal contours are within normal limits. Low lung volumes are noted. Mild atelectasis is suspected within the bilateral lung bases. No acute infiltrate, pleural effusion or pneumothorax is identified. The visualized skeletal structures are unremarkable. IMPRESSION: Low lung volumes with mild bibasilar atelectasis. Electronically Signed   By: Suzen Dials M.D.   On: 04/16/2024 21:05   CT Head Wo Contrast Result Date: 04/11/2024 CLINICAL DATA:  Brain metastases suspected Patient said she has a headache for  3 days. Unable to sleep. Has been vomiting and feels  nauseous. Has liver and stomach cancer. EXAM: CT HEAD WITHOUT CONTRAST TECHNIQUE: Contiguous axial images were obtained from the base of the skull through the vertex without intravenous contrast. RADIATION DOSE REDUCTION: This exam was performed according to the departmental dose-optimization program which includes automated exposure control, adjustment of the mA and/or kV according to patient size and/or use of iterative reconstruction technique. COMPARISON:  MRI head 02/12/2024 FINDINGS: Brain: No evidence of large-territorial acute infarction. No parenchymal hemorrhage. No mass lesion. No extra-axial collection. No mass effect or midline shift. No hydrocephalus. Basilar cisterns are patent. Vascular: No hyperdense vessel. Atherosclerotic calcifications are present within the cavernous internal carotid arteries. Skull: No acute fracture or focal lesion. Sinuses/Orbits: Paranasal sinuses and mastoid air cells are clear. The orbits are unremarkable. Other: None. IMPRESSION: No acute intracranial abnormality. Please note MRI with and without contrast is much more sensitive for evaluation of brain metastases. Electronically Signed   By: Morgane  Naveau M.D.   On: 04/11/2024 19:28     Subjective: No acute issues or events overnight   Discharge Exam: Vitals:   04/27/24 0711 04/27/24 1319  BP: 113/64 130/72  Pulse: 68 82  Resp: 18 15  Temp: 98 F (36.7 C) 97.7 F (36.5 C)  SpO2: 99% 97%   Vitals:   04/26/24 2201 04/27/24 0455 04/27/24 0711 04/27/24 1319  BP: 117/70  113/64 130/72  Pulse: 72  68 82  Resp: 18  18 15   Temp: 97.9 F (36.6 C)  98 F (36.7 C) 97.7 F (36.5 C)  TempSrc: Oral     SpO2: 99%  99% 97%  Weight:  50 kg      General: Pt is alert, awake, not in acute distress Cardiovascular: RRR, S1/S2 +, no rubs, no gallops Respiratory: CTA bilaterally, no wheezing, no rhonchi Abdominal: Soft, NT, ND, bowel sounds + Extremities: no edema, no cyanosis    The results of  significant diagnostics from this hospitalization (including imaging, microbiology, ancillary and laboratory) are listed below for reference.     Microbiology: No results found for this or any previous visit (from the past 240 hours).   Labs: BNP (last 3 results) No results for input(s): BNP in the last 8760 hours. Basic Metabolic Panel: Recent Labs  Lab 04/23/24 2154 04/24/24 0015 04/24/24 0825 04/27/24 0501  NA 133* 137 138 137  K 4.6 4.8 3.9 4.1  CL 99 102 104 102  CO2 22  --  22 24  GLUCOSE 86 95 68* 68*  BUN 6 4* <5* 7  CREATININE 0.55 0.50 0.44 0.52  CALCIUM 10.0  --  9.5 10.0  MG 2.0  --   --   --    Liver Function Tests: Recent Labs  Lab 04/23/24 2154 04/24/24 0825  AST 293* 264*  ALT 144* 130*  ALKPHOS 123 113  BILITOT 1.1 1.1  PROT 8.1 7.5  ALBUMIN 3.8 3.7   Recent Labs  Lab 04/23/24 2154  LIPASE 28   No results for input(s): AMMONIA in the last 168 hours. CBC: Recent Labs  Lab 04/23/24 2154 04/24/24 0015 04/24/24 0825 04/25/24 1030 04/27/24 0501  WBC 6.3  --  6.1 5.9 5.6  NEUTROABS 4.0  --   --   --   --   HGB 7.8* 9.2* 7.5* 8.4* 7.9*  HCT 24.1* 27.0* 22.7* 26.0* 24.5*  MCV 79.3*  --  79.9* 81.5 80.9  PLT 293  --  276 348 308  Cardiac Enzymes: No results for input(s): CKTOTAL, CKMB, CKMBINDEX, TROPONINI in the last 168 hours. BNP: Invalid input(s): POCBNP CBG: Recent Labs  Lab 04/24/24 0943  GLUCAP 123*   D-Dimer No results for input(s): DDIMER in the last 72 hours. Hgb A1c No results for input(s): HGBA1C in the last 72 hours. Lipid Profile No results for input(s): CHOL, HDL, LDLCALC, TRIG, CHOLHDL, LDLDIRECT in the last 72 hours. Thyroid  function studies No results for input(s): TSH, T4TOTAL, T3FREE, THYROIDAB in the last 72 hours.  Invalid input(s): FREET3 Anemia work up No results for input(s): VITAMINB12, FOLATE, FERRITIN, TIBC, IRON, RETICCTPCT in the last 72  hours. Urinalysis    Component Value Date/Time   COLORURINE YELLOW 04/23/2024 2315   APPEARANCEUR CLEAR 04/23/2024 2315   LABSPEC 1.010 04/23/2024 2315   PHURINE 8.0 04/23/2024 2315   GLUCOSEU NEGATIVE 04/23/2024 2315   HGBUR NEGATIVE 04/23/2024 2315   BILIRUBINUR NEGATIVE 04/23/2024 2315   KETONESUR NEGATIVE 04/23/2024 2315   PROTEINUR NEGATIVE 04/23/2024 2315   NITRITE NEGATIVE 04/23/2024 2315   LEUKOCYTESUR NEGATIVE 04/23/2024 2315   Sepsis Labs Recent Labs  Lab 04/23/24 2154 04/24/24 0825 04/25/24 1030 04/27/24 0501  WBC 6.3 6.1 5.9 5.6   Microbiology No results found for this or any previous visit (from the past 240 hours).   Time coordinating discharge: Over 30 minutes  SIGNED:   Elsie JAYSON [place], DO [facility] 04/27/2024, 2:02 PM Pager   If 7PM-7AM, please contact night-coverage [email or web address]

## 2024-04-28 ENCOUNTER — Other Ambulatory Visit (HOSPITAL_COMMUNITY): Payer: Self-pay

## 2024-04-28 ENCOUNTER — Other Ambulatory Visit: Payer: Self-pay

## 2024-04-28 ENCOUNTER — Ambulatory Visit: Payer: Self-pay

## 2024-04-28 ENCOUNTER — Ambulatory Visit
Admission: RE | Admit: 2024-04-28 | Discharge: 2024-04-28 | Disposition: A | Discharge status: Home / Self Care | Payer: Self-pay | Source: Ambulatory Visit | Attending: Radiation Oncology | Admitting: Radiation Oncology

## 2024-04-28 LAB — RAD ONC ARIA SESSION SUMMARY
Course Elapsed Days: 1
Plan Fractions Treated to Date: 2
Plan Prescribed Dose Per Fraction: 3 Gy
Plan Total Fractions Prescribed: 7
Plan Total Prescribed Dose: 21 Gy
Reference Point Dosage Given to Date: 6 Gy
Reference Point Session Dosage Given: 3 Gy
Session Number: 2

## 2024-04-29 ENCOUNTER — Other Ambulatory Visit: Payer: Self-pay

## 2024-04-29 ENCOUNTER — Telehealth: Payer: Self-pay

## 2024-04-29 ENCOUNTER — Inpatient Hospital Stay: Payer: Self-pay | Admitting: Dietician

## 2024-04-29 ENCOUNTER — Encounter (HOSPITAL_COMMUNITY): Payer: Self-pay | Admitting: Oncology

## 2024-04-29 ENCOUNTER — Inpatient Hospital Stay: Payer: Self-pay

## 2024-04-29 ENCOUNTER — Ambulatory Visit
Admission: RE | Admit: 2024-04-29 | Discharge: 2024-04-29 | Disposition: A | Discharge status: Home / Self Care | Payer: Self-pay | Source: Ambulatory Visit | Attending: Radiation Oncology | Admitting: Radiation Oncology

## 2024-04-29 ENCOUNTER — Encounter: Payer: Self-pay | Admitting: Oncology

## 2024-04-29 ENCOUNTER — Other Ambulatory Visit (HOSPITAL_COMMUNITY): Payer: Self-pay

## 2024-04-29 ENCOUNTER — Inpatient Hospital Stay (HOSPITAL_BASED_OUTPATIENT_CLINIC_OR_DEPARTMENT_OTHER): Payer: Self-pay | Admitting: Oncology

## 2024-04-29 VITALS — BP 108/61 | HR 83 | Temp 98.2°F | Resp 14 | Ht 60.0 in | Wt 102.7 lb

## 2024-04-29 DIAGNOSIS — C220 Liver cell carcinoma: Secondary | ICD-10-CM

## 2024-04-29 DIAGNOSIS — G893 Neoplasm related pain (acute) (chronic): Secondary | ICD-10-CM

## 2024-04-29 LAB — CBC WITH DIFFERENTIAL (CANCER CENTER ONLY)
Abs Immature Granulocytes: 0.02 K/uL (ref 0.00–0.07)
Basophils Absolute: 0.1 K/uL (ref 0.0–0.1)
Basophils Relative: 3 %
Eosinophils Absolute: 0.5 K/uL (ref 0.0–0.5)
Eosinophils Relative: 11 %
HCT: 23.6 % — ABNORMAL LOW (ref 36.0–46.0)
Hemoglobin: 8.2 g/dL — ABNORMAL LOW (ref 12.0–15.0)
Immature Granulocytes: 0 %
Lymphocytes Relative: 13 %
Lymphs Abs: 0.6 K/uL — ABNORMAL LOW (ref 0.7–4.0)
MCH: 27 pg (ref 26.0–34.0)
MCHC: 34.7 g/dL (ref 30.0–36.0)
MCV: 77.6 fL — ABNORMAL LOW (ref 80.0–100.0)
Monocytes Absolute: 0.3 K/uL (ref 0.1–1.0)
Monocytes Relative: 7 %
Neutro Abs: 3 K/uL (ref 1.7–7.7)
Neutrophils Relative %: 66 %
Platelet Count: 314 K/uL (ref 150–400)
RBC: 3.04 MIL/uL — ABNORMAL LOW (ref 3.87–5.11)
RDW: 24.3 % — ABNORMAL HIGH (ref 11.5–15.5)
WBC Count: 4.5 K/uL (ref 4.0–10.5)
nRBC: 0 % (ref 0.0–0.2)

## 2024-04-29 LAB — CMP (CANCER CENTER ONLY)
ALT: 117 U/L — ABNORMAL HIGH (ref 0–44)
AST: 273 U/L (ref 15–41)
Albumin: 3.9 g/dL (ref 3.5–5.0)
Alkaline Phosphatase: 112 U/L (ref 38–126)
Anion gap: 8 (ref 5–15)
BUN: 8 mg/dL (ref 6–20)
CO2: 24 mmol/L (ref 22–32)
Calcium: 9.7 mg/dL (ref 8.9–10.3)
Chloride: 102 mmol/L (ref 98–111)
Creatinine: 0.54 mg/dL (ref 0.44–1.00)
GFR, Estimated: >60 mL/min (ref >60)
Glucose, Bld: 88 mg/dL (ref 70–99)
Potassium: 4.1 mmol/L (ref 3.5–5.1)
Sodium: 134 mmol/L — ABNORMAL LOW (ref 135–145)
Total Bilirubin: 1.5 mg/dL — ABNORMAL HIGH (ref 0.0–1.2)
Total Protein: 8.5 g/dL — ABNORMAL HIGH (ref 6.5–8.1)

## 2024-04-29 LAB — RAD ONC ARIA SESSION SUMMARY
Course Elapsed Days: 2
Plan Fractions Treated to Date: 3
Plan Prescribed Dose Per Fraction: 3 Gy
Plan Total Fractions Prescribed: 7
Plan Total Prescribed Dose: 21 Gy
Reference Point Dosage Given to Date: 9 Gy
Reference Point Session Dosage Given: 3 Gy
Session Number: 3

## 2024-04-29 LAB — TOTAL PROTEIN, URINE DIPSTICK: Protein, ur: NEGATIVE mg/dL

## 2024-04-29 LAB — TSH: TSH: 3.14 u[IU]/mL (ref 0.350–4.500)

## 2024-04-29 MED ORDER — SODIUM CHLORIDE 0.9 % IV SOLN: Freq: Once | INTRAVENOUS | Status: AC

## 2024-04-29 MED ORDER — MEGESTROL ACETATE 625 MG/5ML PO SUSP
625.0000 mg | Freq: Every day | ORAL | 2 refills | Status: DC
  Filled 2024-04-29: qty 150, 30d supply, fill #0

## 2024-04-29 MED ORDER — PROCHLORPERAZINE MALEATE 10 MG PO TABS
10.0000 mg | ORAL_TABLET | Freq: Four times a day (QID) | ORAL | 1 refills | Status: DC | PRN
  Filled 2024-04-29: qty 30, 8d supply, fill #0

## 2024-04-29 MED ORDER — LIDOCAINE-PRILOCAINE 2.5-2.5 % EX CREA
1.0000 | TOPICAL_CREAM | CUTANEOUS | 3 refills | Status: DC | PRN
  Filled 2024-04-29: qty 30, 7d supply, fill #0

## 2024-04-29 NOTE — Progress Notes (Signed)
 Nutrition Follow-up:  Pt with stage III HCC. She is receiving tecentriq  + avastin  (start 3/7). Palliative radiation start 10/23. Patient is under the care of Dr. Autumn   10/18-10/22 admission - intractable nausea with vomiting  9/7 - seen in ED for cancer-related breakthrough pain 9/4 - seen in ED for weakness/dehydration  8/30 - seen in ED for pelvic pain/constipation   Met with patient and husband in infusion. Interpretor (Julie) present at visit today. Patient reports feeling much better than she did prior to admission. Nausea with vomiting has resolved. Patient endorses anorexia. Pushing herself to drink 2 Ensure original which provides total 440 kcal, 18 g. Patient may or may not sip on broth from chicken soup.   Medications: roxicodone , zofran  (10/22)  Labs: Na 134  Anthropometrics: Wt 102 lb 11.2 oz - decreased 4.7% in 6 days which is severe for time frame  10/16 - 107 lb 6.4 oz  10/6 - 110 lb 3.7 oz    NUTRITION DIAGNOSIS: Food and nutrition related knowledge deficit continues    MALNUTRITION DIAGNOSIS: Patient meets criteria for severe malnutrition related to [facility] as evidenced by severe 7.3% wt loss in 18 days, dietary recall   INTERVENTION:  Continue drinking 2 Ensure, recommend switching to Ensure Plus/complete to maximize calories and protein - samples + coupons provided  Consider appetite stimulant - MD agreeable (megace sent to community pharmacy - pt aware)    MONITORING, EVALUATION, GOAL: wt trends, intake   NEXT VISIT: To be scheduled

## 2024-04-29 NOTE — Patient Instructions (Signed)
Deshidratacin en los adultos Dehydration, Adult La deshidratacin es una afeccin que se caracteriza por una cantidad insuficiente de agua u otros lquidos en el organismo. Esto sucede cuando una persona pierde ms lquido del que consume. Los rganos importantes no pueden funcionar correctamente sin una cantidad [place] de lquidos. Cualquier prdida de lquidos del organismo puede causar deshidratacin. La deshidratacin puede ser leve, grave o muy grave. Debe tratarse de inmediato para evitar que sea muy grave. Cules son las causas? Afecciones que causan prdida de agua en el cuerpo. Entre ellas, se incluyen las siguientes: Materia fecal lquida (diarrea). Vmitos. Sudoracin abundante. Cristy Hilts. Infeccin. Hacer mucho pis (orinar). No beber la cantidad suficiente de lquidos. Determinados medicamentos, como aquellos que eliminan el exceso de lquido del organismo (diurticos). Falta de agua potable segura. No poder obtener suficiente agua y alimentos. Qu incrementa el riesgo? Tener una enfermedad prolongada (crnica) que no se ha tratado de Estate agent, como: Diabetes. Enfermedad cardaca. Enfermedad renal. Ser mayor de 20 aos de edad. Tener una discapacidad. Vivir en un lugar alto con respecto al suelo o al nivel del mar (de gran altitud). El aire menos denso y ms seco causa ms prdida de lquidos. Hacer ejercicios que sobrecargan el cuerpo [employer]. Estar activo cuando se encuentra en lugares calurosos. Cules son los signos o sntomas? Los sntomas de deshidratacin dependen de su gravedad. Deshidratacin leve o grave Sed. Sequedad en los labios o la boca. Sentirse mareado o aturdido. Calambres musculares. Producir muy poca cantidad de [place], o de color oscuro. Orina que puede tener el color del t. Dolor de [place]. Deshidratacin muy grave Cambios en la piel. La piel puede: Estar fra al tacto (pegajosa). Tener manchas o estar plida. No  volver a la normalidad de inmediato despus de pellizcarla y soltarla. Escasa produccin o ausencia de lgrimas, orina o sudor. Respiracin acelerada. Presin arterial baja. Pulso dbil. Pulso que supera los 100 latidos por minuto cuando est sentado y [place]. Otros cambios, por ejemplo: Sentir mucha sed. Ojos que se ven huecos (hundidos). Manos y pies fros. Confusin. Estar muy cansado (aletargado) o tener problemas para despertarse. Bajar de [place]. Prdida de la conciencia. Cmo se trata? El tratamiento de esta afeccin depende de la gravedad de la deshidratacin. El tratamiento debe comenzar de inmediato. No espere hasta que su afeccin sea muy grave. La deshidratacin muy grave es Engineer, maintenance (IT). Tendr que ir al hospital. La deshidratacin leve o grave puede tratarse en la casa. Le pedirn que: Beba ms lquidos. Beba una solucin de rehidratacin oral (SRO). Esta bebida le proporciona la cantidad correcta de lquidos, sales y Optometrist (electrolitos). La deshidratacin muy grave puede tratarse: Con lquidos a travs de [employer]. Corrigiendo los niveles bajos de electrolitos en el cuerpo. Tratando el problema que provoc la deshidratacin. Siga estas instrucciones en su casa: Solucin de rehidratacin oral Si se lo indica el mdico, beba una SRO: Prepare una SRO. Use las instrucciones del envase. Comience por beber pequeas cantidades, aproximadamente  taza (120 ml) cada 5 a 10 minutos. Bonnita Nasuti de a Firefighter a la cantidad indicada por el mdico.  Comida y bebida  Beba suficiente lquido transparente como para mantener la orina de color amarillo plido. Si le indicaron que tome una SRO, termine primero la solucin. Luego, comience a beber lentamente otros lquidos transparentes. Beba lquidos, por ejemplo: Agua. No beba solamente agua. Esto puede [employer] niveles de sal (sodio) en el organismo bajen demasiado. Agua de trocitos de [employer]  usted  succiona. Jugo de frutas con agregado de agua (diluido). Bebidas deportivas de bajas caloras. Coma alimentos que contengan cantidades [address] de sales y [place], como bananas, [place], papas, tomates o espinaca. No beba alcohol. Evite las bebidas que contengan cafena o azcar. Estas incluyen las siguientes: Bebidas deportivas ricas en caloras. Jugo de frutas sin agregado de agua. Gaseosas. Caf o bebidas energticas. Evite los alimentos grasos o que contienen mucha grasa o International aid/development worker. Instrucciones generales [employer] de venta libre y los recetados solamente como se lo haya indicado el mdico. No tome comprimidos de [place]. Esto puede hacer que los niveles de sal en el cuerpo suban demasiado. Retome sus actividades normales segn lo indicado por el mdico. Pregntele al mdico qu actividades son seguras para usted. Concurra a todas las visitas de seguimiento. Es posible que el mdico controle y [place]. Comunquese con un mdico si: Tiene dolor de vientre (abdomen) y el dolor: Empeora. Permanece en un solo Environmental consultant. Tiene una erupcin cutnea. Presenta rigidez en el cuello. Se siente enojado o molesto con ms facilidad de lo normal. Est ms cansado o le cuesta despertarse ms de lo normal. Se siente dbil o mareado. Tiene mucha sed. Solicite ayuda de inmediato si: Tiene sntomas de una deshidratacin muy grave. Vomita cada vez que come o bebe. Los vmitos empeoran, no desaparecen o vomita sangre o una sustancia verde. Recibe tratamiento, pero los sntomas empeoran. Tiene fiebre. Tiene un dolor de cabeza muy intenso. Tiene lo siguiente: Diarrea que empeora o que no desaparece. Sangre en las heces (materia fecal). Esto puede hacer que la materia fecal sea negra y tenga aspecto alquitranado. No orina en un lapso de 6 a 8 horas. Solo produce una cantidad pequea de orina en el trmino de 6 a 8 horas, y la orina es [place]. Tiene dificultad para  respirar. Estos sntomas pueden Customer service manager. Solicite ayuda de inmediato. Llame al 911. No espere a ver si los sntomas desaparecen. No conduzca por sus propios medios [employer]. Esta informacin no tiene Theme park manager el consejo del mdico. Asegrese de hacerle al mdico cualquier pregunta que tenga. Document Revised: 02/24/2022 Document Reviewed: 02/24/2022 Elsevier Patient Education  2024 [employer].

## 2024-04-29 NOTE — Progress Notes (Signed)
 Per Dr. Autumn- pt wants to continue to defer tx. Proceed with 1L NS IV bolus only.

## 2024-04-29 NOTE — Telephone Encounter (Signed)
 CRITICAL VALUE STICKER  CRITICAL VALUE: AST [id number]  RECEIVER (on-site recipient of call): Norleen Spain CMA  DATE & TIME NOTIFIED:  1030 10/24  MESSENGER (representative from lab): Pam in lab  MD NOTIFIED: Pasam  TIME OF NOTIFICATION: 1030   RESPONSE:  Made physician aware

## 2024-04-29 NOTE — Assessment & Plan Note (Addendum)
 She has had progressively worsening abdominal pain from the malignancy.  Currently taking liquid morphine  with relief in symptoms.  Her case was previously discussed in GI tumor conference and consensus opinion is to proceed with palliative radiation and also IR guided ablation to help with pain symptoms.  She is currently receiving radiation for pain control over the direction of Dr. Dewey

## 2024-04-29 NOTE — Assessment & Plan Note (Addendum)
 Please review oncology history for additional details and timeline of events.  MRI of the abdomen with liver protocol on 08/21/2023 showed numerous hepatic lesions are noted, with a dominant lesion which replaces much of the right lobe of the liver estimated to measure approximately 16.5 x 12.7 x 17.7 cm. This lesion is heterogeneous in signal intensity on T1 and T2 weighted images, but clearly has internal areas of hypervascular enhancement on early phase post gadolinium imaging, with persistent low-level enhancement on more delayed imaging. The other dominant lesion is centered in the superior aspect of the liver, likely within the superior aspect of segment 4A estimated to measure approximately 8.1 x 6.8 x 7.8 cm, with similar imaging characteristics to the previously described lesion, although with greater degree of internal washout and probable pseudo capsule on delayed imaging. Multiple other smaller hypervascular lesions are apparent on arterial phase imaging throughout all aspects of the liver, some of which demonstrate delayed washout.   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  This was confirmed during GI tumor conference on 09/09/2023.  -Previously undiagnosed hepatitis B could have been the contributing factor. -Not a candidate for liver transplant or surgical resection because of the extent of disease. - Previously I discussed diagnosis, prognosis, plan of care, treatment options.  Reviewed NCCN guidelines.   On 08/21/2023, AFP was significantly elevated at 32,160 ng/mL.     CT chest on 08/23/2023 showed no evidence of intrathoracic metastatic disease.  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  Plan made for systemic treatments with atezolizumab  plus bevacizumab .  Started this from 09/11/2023.  She has been tolerating treatments well without any major side effects.  Restaging MRI of the liver on 12/14/2023 showed stable disease without evidence of disease progression or new  disease.  She had CT abdomen and pelvis on 02/12/2024 when she was hospitalized and it also showed stable disease.  Plan was to continue current management.  However given progressive gum bleeding, we started holding bevacizumab  to see if that would help improve her symptoms.  Last dose of Avastin  was on 12/25/2023.  She has since established with a dentist and has periodontal disease that is causing gum bleeding.  It is slowly improving.  Last dose of atezolizumab  was on 02/26/2024.  After her last dose, patient developed significant fatigue, decreased appetite and inability to eat.  Also has been having progressive abdominal pain which needed multiple ED visits.  CT abdomen and pelvis was obtained on 03/06/2024 which showed signs of partial small bowel obstruction.  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  Though radiologically her disease is stable, clinically she is progressing.  Patient does not want to continue with atezolizumab  as she is afraid of worsening fatigue, failure to thrive.  Her case was discussed in GI tumor conference.  Plan made to proceed with palliative radiation and also IR guided ablation to help with her pain.  Will continue supportive care for now.  She is currently receiving IV fluids on Mondays and Thursdays at her infusion center on [address] and this has helped her significantly and has improved quality of life.  Whenever she is ready to resume treatments, we will discuss alternative treatment options including possibility of lenvatinib versus ramucirumab.  She is currently receiving radiation therapy under the direction of Dr. Dewey to palliate her pain.

## 2024-04-29 NOTE — Progress Notes (Signed)
[facility]  ONCOLOGY CLINIC PROGRESS NOTE   Patient Care Team: [name], MD as PCP - General (Oncology)  PATIENT NAME: Christina Gutierrez   MR#: [id number] DOB: 08/22/1975  Date of visit: 04/29/2024   ASSESSMENT & PLAN:   Scherry Laverne is a 48 y.o. Hispanic lady with no significant past medical history was hospitalized on 08/21/2023 after she presented with worsening right-sided abdominal pain, nausea, vomiting, decreased oral intake.  Workup including MRI of the abdomen showed evidence of multifocal hepatocellular carcinoma.  She was diagnosed with hepatitis B infection during this hospital admission.   Hepatocellular carcinoma (HCC) Please review oncology history for additional details and timeline of events.  MRI of the abdomen with liver protocol on 08/21/2023 showed numerous hepatic lesions are noted, with a dominant lesion which replaces much of the right lobe of the liver estimated to measure approximately 16.5 x 12.7 x 17.7 cm. This lesion is heterogeneous in signal intensity on T1 and T2 weighted images, but clearly has internal areas of hypervascular enhancement on early phase post gadolinium imaging, with persistent low-level enhancement on more delayed imaging. The other dominant lesion is centered in the superior aspect of the liver, likely within the superior aspect of segment 4A estimated to measure approximately 8.1 x 6.8 x 7.8 cm, with similar imaging characteristics to the previously described lesion, although with greater degree of internal washout and probable pseudo capsule on delayed imaging. Multiple other smaller hypervascular lesions are apparent on arterial phase imaging throughout all aspects of the liver, some of which demonstrate delayed washout.   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  This was confirmed during GI tumor conference on 09/09/2023.  -Previously undiagnosed hepatitis B could have  been the contributing factor. -Not a candidate for liver transplant or surgical resection because of the extent of disease. - Previously I discussed diagnosis, prognosis, plan of care, treatment options.  Reviewed NCCN guidelines.   On 08/21/2023, AFP was significantly elevated at 32,160 ng/mL.     CT chest on 08/23/2023 showed no evidence of intrathoracic metastatic disease.  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  Plan made for systemic treatments with atezolizumab  plus bevacizumab .  Started this from 09/11/2023.  She has been tolerating treatments well without any major side effects.  Restaging MRI of the liver on 12/14/2023 showed stable disease without evidence of disease progression or new disease.  She had CT abdomen and pelvis on 02/12/2024 when she was hospitalized and it also showed stable disease.  Plan was to continue current management.  However given progressive gum bleeding, we started holding bevacizumab  to see if that would help improve her symptoms.  Last dose of Avastin  was on 12/25/2023.  She has since established with a dentist and has periodontal disease that is causing gum bleeding.  It is slowly improving.  Last dose of atezolizumab  was on 02/26/2024.  After her last dose, patient developed significant fatigue, decreased appetite and inability to eat.  Also has been having progressive abdominal pain which needed multiple ED visits.  CT abdomen and pelvis was obtained on 03/06/2024 which showed signs of partial small bowel obstruction.  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  Though radiologically her disease is stable, clinically she is progressing.  Patient does not want to continue with atezolizumab  as she is afraid of worsening fatigue, failure to thrive.  Her case was  discussed in GI tumor conference.  Plan made to proceed with palliative radiation and also  IR guided ablation to help with her pain.  Will continue supportive care for now.  She is currently receiving IV fluids on Mondays and Thursdays at her infusion center on [address] and this has helped her significantly and has improved quality of life.  Whenever she is ready to resume treatments, we will discuss alternative treatment options including possibility of lenvatinib versus ramucirumab.  She is currently receiving radiation therapy under the direction of Dr. Dewey to palliate her pain.  Cancer associated pain She has had progressively worsening abdominal pain from the malignancy.  Currently taking liquid morphine  with relief in symptoms.  Her case was previously discussed in GI tumor conference and consensus opinion is to proceed with palliative radiation and also IR guided ablation to help with pain symptoms.  She is currently receiving radiation for pain control over the direction of Dr. Dewey  Anemia in neoplastic disease Anemia with hemoglobin level at 8.2, not requiring blood transfusion.  Abnormal liver function tests Abnormal liver function tests showing improvement compared to three weeks ago, though still elevated.  Gastroesophageal reflux disease GERD managed with pantoprazole  - Send prescription for pantoprazole  to community pharmacy  I reviewed lab results and outside records for this visit and discussed relevant results with the patient. Diagnosis, plan of care and treatment options were also discussed in detail with the patient. Opportunity provided to ask questions and answers provided to her apparent satisfaction. Provided instructions to call our clinic with any problems, questions or concerns prior to return visit. I recommended to continue follow-up with PCP and sub-specialists. She verbalized understanding and agreed with the plan.   NCCN guidelines have been consulted in the planning of this patient's care.  I spent a total of 40 minutes during this  encounter with the patient including review of chart and various tests results, discussions about plan of care and coordination of care plan.  Chinita Patten, MD  04/29/2024 9:02 AM   [facility] - [facility] DEPT [facility] [address] [place] [address] Dept: [phone] Dept Fax: [phone]    CHIEF COMPLAINT/ REASON FOR VISIT:   Multifocal hepatocellular carcinoma, diagnosed based on MRI findings.  Current Treatment: Systemic treatment with atezolizumab  plus bevacizumab  starting from 09/11/2023.  INTERVAL HISTORY:    Discussed the use of AI scribe software for clinical note transcription with the patient, who gave verbal consent to proceed.  History of Present Illness  Aliveah Gallant is a 48 year old female who presents with persistent vomiting, dizziness, and weakness.  She was recently discharged from the hospital after a five-day stay due to persistent vomiting, dizziness, and significant weakness. The vomiting was described as nonstop, and she also experienced dizziness and an inability to function due to weakness. Since yesterday, the vomiting has stopped.  She reports pain in the lower back, which radiates towards the hip. For pain management, she is taking oxycodone  in liquid form, which she tolerates better than pills. She is currently undergoing daily radiation therapy since Wednesday.  She has experienced significant weight loss, attributed to nausea and decreased oral intake. For nausea, she is taking Zofran , which has been effective. She is receiving fluids twice a week at [facility] to help with hydration. She drinks about one and a half liters of fluid daily, including green juice at home.  She had  scans performed on October 12th and 19th, as well as in August and July.    I have reviewed the past medical history, past surgical history, social history and family history with the patient  and they are unchanged from previous note.  HISTORY OF PRESENT ILLNESS:   ONCOLOGY HISTORY:   48 y.o. Hispanic lady with no significant past medical history, presented to the emergency department on 08/21/2023 with complaints of right-sided abdominal pain, nausea, vomiting and decreased oral intake for at least 10 days prior to arrival.   On arrival to ED, she was found to have anemia with hemoglobin of 8, white count normal at 6100, platelet count 316,000.  CMP showed elevated AST of 80, ALT increased at 79, bilirubin elevated at 1.8.  Elevated ammonia level of 62.   Right upper quadrant ultrasound showed large heterogenoussolid mass within the liver measuring up to 16.9 cm, suspicious for malignancy. Recommend further evaluation with contrast-enhanced CT or MRI.   CT abdomen pelvis showed: 15.5 x 12.5 cm solid well-circumscribed mass within the right hepatic lobe. Areas of central low-density suggest possibility of central scar. There may be a large feeding vessel. Favor focal nodular hyperplasia although fibrolamellar HCC can have a similar appearance. Recommend further evaluation MRI without and with contrast.   Left spontaneous splenorenal shunt suggest portal venous hypertension. No evidence for cirrhosis. This may be related to mass effect and compression of the portal vein from the large mass.   She was recently diagnosed with chickenpox about 20 days prior to this hospitalization.  Still had some itching from vesicles.   She was admitted for further evaluation and management.   MRI of the abdomen with liver protocol on 08/21/2023 showed numerous hepatic lesions are noted, with a dominant lesion which replaces much of the right lobe of the liver estimated to measure approximately 16.5 x 12.7 x 17.7 cm. This lesion is heterogeneous in signal intensity on T1 and T2 weighted images, but clearly has internal areas of hypervascular enhancement on early phase post gadolinium imaging, with  persistent low-level enhancement on more delayed imaging. The other dominant lesion is centered in the superior aspect of the liver, likely within the superior aspect of segment 4A estimated to measure approximately 8.1 x 6.8 x 7.8 cm, with similar imaging characteristics to the previously described lesion, although with greater degree of internal washout and probable pseudo capsule on delayed imaging. Multiple other smaller hypervascular lesions are apparent on arterial phase imaging throughout all aspects of the liver, some of which demonstrate delayed washout.   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   On 08/21/2023, AFP was significantly elevated at 32,160 ng/mL.     She was also diagnosed with hepatitis B infection on additional workup.   CT chest on 08/23/2023 showed no evidence of intrathoracic metastatic disease.   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  Her case was discussed in GI tumor conference on 09/09/2023.  Reviewed images with the team and confirmed multifocal hepatocellular carcinoma.   Plan made for systemic treatments with atezolizumab  plus bevacizumab .  Started from 09/11/2023.  Restaging MRI of the liver on 12/14/2023 showed stable disease without evidence of disease progression or new disease.  Plan to continue current management.  However given progressive gum bleeding, Avastin  was held from 01/15/2024.  Because of progressive fatigue, decreased appetite and other symptoms, patient decided to take a break from immunotherapy.  Her last treatment was on 02/26/2024.  CT abdomen and pelvis was obtained on  03/06/2024 which showed signs of partial small bowel obstruction.  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  Her case was discussed in GI tumor conference.  Plan made to proceed with palliative radiation and also IR guided ablation to help  with her pain.  Oncology History  Hepatocellular carcinoma (HCC)  08/22/2023 Initial Diagnosis   Hepatocellular carcinoma (HCC)   08/27/2023 Cancer Staging   Staging form: Liver, AJCC 8th Edition - Clinical: Stage IIIA (cT3, cN0, cM0) - Signed by [name], MD on 08/27/2023   09/11/2023 -  Chemotherapy   Patient is on Treatment Plan : HEPATOCELLULAR Atezolizumab  + Bevacizumab  q21d         REVIEW OF SYSTEMS:   Review of Systems - Oncology  All other pertinent systems were reviewed with the patient and are negative.  ALLERGIES: She has no known allergies.  MEDICATIONS:  Current Outpatient Medications  Medication Sig Dispense Refill   ALPRAZolam  (XANAX ) 0.5 MG tablet Take 1 tablet (0.5 mg total) by mouth at bedtime as needed for anxiety. 30 tablet 0   lidocaine -prilocaine  (EMLA ) cream Apply 1 Application topically as needed. 30 g 3   ondansetron  (ZOFRAN ) 8 MG tablet Take 1 tablet (8 mg total) by mouth every 8 (eight) hours as needed for nausea or vomiting. 60 tablet 0   oxyCODONE  (ROXICODONE ) 5 MG/5ML solution Take 5 mLs (5 mg total) by mouth every 4 (four) hours as needed for up to 7 days for moderate pain (pain score 4-6) or severe pain (pain score 7-10). 100 mL 0   pantoprazole  (PROTONIX ) 40 MG tablet Take 1 tablet (40 mg total) by mouth daily at 6 (six) AM. 30 tablet 2   polyethylene glycol powder (GLYCOLAX /MIRALAX ) 17 GM/SCOOP powder Take 17 g by mouth daily. Dissolve 1 capful (17g) in 4-8 ounces of liquid and take by mouth daily. (Patient taking differently: Take 17 g by mouth daily as needed for mild constipation.) 238 g 0   prochlorperazine  (COMPAZINE ) 10 MG tablet Take 1 tablet (10 mg total) by mouth every 6 (six) hours as needed for nausea or vomiting. 30 tablet 1   senna-docusate (SENOKOT-S) 8.6-50 MG tablet Take 1 tablet by mouth 2 (two) times daily. (Patient taking differently: Take 1 tablet by mouth at bedtime as needed for mild constipation.) 60 tablet 0   tenofovir   alafenamide (VEMLIDY ) 25 MG tablet Take 1 tablet (25 mg total) by mouth daily. 30 tablet 11   No current facility-administered medications for this visit.     VITALS:   Blood pressure 108/61, pulse 83, temperature 98.2 F (36.8 C), temperature source Temporal, resp. rate 14, height 5' (1.524 m), weight 102 lb 11.2 oz (46.6 kg), SpO2 98%.  Wt Readings from Last 3 Encounters:  04/29/24 102 lb 11.2 oz (46.6 kg)  04/27/24 110 lb 3.7 oz (50 kg)  04/21/24 107 lb 6.4 oz (48.7 kg)    Body mass index is 20.06 kg/m.    Onc Performance Status - 04/29/24 0800       ECOG Perf Status   ECOG Perf Status Restricted in physically strenuous activity but ambulatory and able to carry out work of a light or sedentary nature, e.g., light house work, office work      KPS SCALE   KPS % SCORE Able to carry on normal activity, minor s/s of disease             PHYSICAL  EXAM:   Physical Exam Constitutional:      General: She is not in acute distress.    Appearance: Normal appearance.  HENT:     Head: Normocephalic and atraumatic.  Eyes:     General: No scleral icterus.    Conjunctiva/sclera: Conjunctivae normal.  Cardiovascular:     Rate and Rhythm: Normal rate and regular rhythm.     Heart sounds: Normal heart sounds.  Pulmonary:     Effort: Pulmonary effort is normal.     Breath sounds: Normal breath sounds.  Abdominal:     Palpations: There is mass (stable hepatomegaly).  Musculoskeletal:     Right lower leg: No edema.     Left lower leg: No edema.  Neurological:     General: No focal deficit present.     Mental Status: She is alert.  Psychiatric:        Mood and Affect: Mood normal.        Behavior: Behavior normal.      LABORATORY DATA:   I have reviewed the data as listed.  Results for orders placed or performed in visit on 04/29/24  CBC with Differential (Cancer Center Only)  Result Value Ref Range   WBC Count 4.5 4.0 - 10.5 K/uL   RBC 3.04 (L) 3.87 - 5.11 MIL/uL    Hemoglobin 8.2 (L) 12.0 - 15.0 g/dL   HCT 76.3 (L) 63.9 - 53.9 %   MCV 77.6 (L) 80.0 - 100.0 fL   MCH 27.0 26.0 - 34.0 pg   MCHC 34.7 30.0 - 36.0 g/dL   RDW 75.6 (H) 88.4 - 84.4 %   Platelet Count 314 150 - 400 K/uL   nRBC 0.0 0.0 - 0.2 %   Neutrophils Relative % 66 %   Neutro Abs 3.0 1.7 - 7.7 K/uL   Lymphocytes Relative 13 %   Lymphs Abs 0.6 (L) 0.7 - 4.0 K/uL   Monocytes Relative 7 %   Monocytes Absolute 0.3 0.1 - 1.0 K/uL   Eosinophils Relative 11 %   Eosinophils Absolute 0.5 0.0 - 0.5 K/uL   Basophils Relative 3 %   Basophils Absolute 0.1 0.0 - 0.1 K/uL   Immature Granulocytes 0 %   Abs Immature Granulocytes 0.02 0.00 - 0.07 K/uL      RADIOGRAPHIC STUDIES:  Reviewed MRI from 12/14/2023 which showed stable disease.  MR Brain W and Wo Contrast EXAM: MRI BRAIN WITH AND WITHOUT CONTRAST 04/24/2024 08:49:11 AM  TECHNIQUE: Multiplanar multisequence MRI of the head/brain was performed with and without the administration of intravenous contrast.  COMPARISON: MRI of the head dated 02/12/2024.  CLINICAL HISTORY: Metastatic disease evaluation. Prior 02/12/2024. 5mL Gadavist  given.  FINDINGS:  BRAIN AND VENTRICLES: No acute infarct. No acute intracranial hemorrhage. No mass effect or midline shift. No hydrocephalus. The sella is unremarkable. Normal flow voids. No mass or abnormal enhancement.  ORBITS: No acute abnormality.  SINUSES: No acute abnormality.  BONES AND SOFT TISSUES: Normal bone marrow signal and enhancement. No acute soft tissue abnormality.  IMPRESSION: 1. No acute intracranial abnormality. 2. No mass or abnormal enhancement.  Electronically signed by: Evalene Coho MD 04/24/2024 09:00 AM EDT RP Workstation: [id number] CT ABDOMEN PELVIS W CONTRAST EXAM: CT ABDOMEN AND PELVIS WITH CONTRAST 04/24/2024 02:31:11 AM  TECHNIQUE: CT of the abdomen and pelvis was performed with the administration of 100 mL of iohexol  (OMNIPAQUE ) 300 MG/ML  solution. Multiplanar reformatted images are provided for review. Automated exposure control, iterative reconstruction, and/or weight-based adjustment of the  mA/kV was utilized to reduce the radiation dose to as low as reasonably achievable.  COMPARISON: CT abdomen and pelvis 04/17/2024, CT abdomen and pelvis 09/18/2023.  CLINICAL HISTORY: Bowel obstruction suspected.  FINDINGS:  LOWER CHEST: No acute abnormality.  LIVER: Redemonstration of multiple heterogeneous masses of the liver consistent with known multifocal hepatocellular carcinoma with largest lesion measuring up to 16 cm (2: 29). Persistent mass effect on the inferior vena cava from the largest mass.  GALLBLADDER AND BILE DUCTS: Gallbladder is unremarkable. No biliary ductal dilatation.  SPLEEN: Splenorenal shunt again noted.  PANCREAS: No acute abnormality.  ADRENAL GLANDS: No acute abnormality.  KIDNEYS, URETERS AND BLADDER: No stones in the kidneys or ureters. No hydronephrosis. No perinephric or periureteral stranding. Urinary bladder is unremarkable.  GI AND BOWEL: Stomach demonstrates no acute abnormality. No small or large bowel wall thickening. Appendix is unremarkable. There is no bowel obstruction.  PERITONEUM AND RETROPERITONEUM: No ascites. No free air.  VASCULATURE: Aorta is normal in caliber. Portal, splenic, superior mesenteric vein are patent.  LYMPH NODES: No lymphadenopathy.  REPRODUCTIVE ORGANS: Uterus is unremarkable. No adnexal masses.  BONES AND SOFT TISSUES: No acute osseous abnormality. No focal soft tissue abnormality.  IMPRESSION: 1. Redemonstration of multifocal hepatocellular carcinoma with largest lesion measuring up to 16 cm, causing persistent mass effect on the inferior vena cava. 2. No evidence of bowel obstruction.  Electronically signed by: Morgane Naveau MD 04/24/2024 02:58 AM EDT RP Workstation: [id number]    CODE STATUS:  Code Status History      Date Active Date Inactive Code Status Order ID Comments User Context   08/29/2023 0409 08/31/2023 1925 Full Code [id number]  Shona Terry SAILOR, DO ED   08/21/2023 0400 08/23/2023 2127 Full Code [id number]  Lee Kingfisher, MD ED    Questions for Most Recent Historical Code Status (Order [id number])     Question Answer   By: Consent: discussion documented in EHR           Future Appointments  Date Time Provider Department Center  04/29/2024  9:30 AM [facility] INFUSION [facility] None  04/29/2024 10:30 AM Ivonne Harlene RAMAN, RD [facility] None  04/29/2024 12:30 PM [facility] [id number] [facility] None  04/29/2024 12:45 PM LINAC-MOODY [facility] None  05/02/2024 11:00 AM [facility] LINAC 3 [facility] None  05/02/2024 11:30 AM [facility] 5 [facility] None  05/03/2024 12:30 PM [facility] [id number] [facility] None  05/04/2024  1:00 PM [facility] LINAC 4 [facility] None  05/05/2024 11:30 AM [facility] 5 [facility] None  05/05/2024  2:45 PM [facility] LINAC 3 [facility] None  05/05/2024  3:00 PM [facility] PALLIATIVE CARE [facility] None  05/09/2024 11:30 AM [facility] 8 [facility] None  05/12/2024 11:30 AM [facility]CHA[facility] 6 [facility] None  05/16/2024 11:30 AM [facility] 6 [facility] None  05/19/2024 11:30 AM [facility]CHAIR 2 [facility] None  05/23/2024 11:30 AM [facility] 3 [facility] None  05/26/2024 11:30 AM [facility]CHA[facility] 8 [facility] None  05/30/2024 11:30 AM [facility] 6 [facility] None  06/01/2024 11:30 AM [facility] 7 [facility] None  07/05/2024  1:45 PM Fleeta Rothman, Jomarie SAILOR, MD [facility]     This document was completed utilizing speech recognition software. Grammatical errors, random word insertions, pronoun errors, and incomplete sentences are an occasional consequence of this system due to software limitations, ambient noise, and hardware issues. Any formal questions or concerns about the content, text or information contained within the body of this dictation should be directly  addressed to the provider for clarification.

## 2024-04-30 ENCOUNTER — Other Ambulatory Visit: Payer: Self-pay

## 2024-04-30 ENCOUNTER — Emergency Department (HOSPITAL_COMMUNITY): Payer: Self-pay

## 2024-04-30 ENCOUNTER — Emergency Department (HOSPITAL_COMMUNITY)
Admission: EM | Admit: 2024-04-30 | Discharge: 2024-04-30 | Disposition: A | Discharge status: Home / Self Care | Payer: Self-pay | Attending: Emergency Medicine | Admitting: Emergency Medicine

## 2024-04-30 ENCOUNTER — Encounter (HOSPITAL_COMMUNITY): Payer: Self-pay | Admitting: Emergency Medicine

## 2024-04-30 DIAGNOSIS — M25551 Pain in right hip: Secondary | ICD-10-CM | POA: Insufficient documentation

## 2024-04-30 DIAGNOSIS — M25552 Pain in left hip: Secondary | ICD-10-CM | POA: Insufficient documentation

## 2024-04-30 DIAGNOSIS — R1084 Generalized abdominal pain: Secondary | ICD-10-CM | POA: Insufficient documentation

## 2024-04-30 DIAGNOSIS — Z8505 Personal history of malignant neoplasm of liver: Secondary | ICD-10-CM | POA: Insufficient documentation

## 2024-04-30 LAB — COMPREHENSIVE METABOLIC PANEL WITH GFR
ALT: 139 U/L — ABNORMAL HIGH (ref 0–44)
AST: 361 U/L — ABNORMAL HIGH (ref 15–41)
Albumin: 4 g/dL (ref 3.5–5.0)
Alkaline Phosphatase: 130 U/L — ABNORMAL HIGH (ref 38–126)
Anion gap: 11 (ref 5–15)
BUN: 5 mg/dL — ABNORMAL LOW (ref 6–20)
CO2: 25 mmol/L (ref 22–32)
Calcium: 10 mg/dL (ref 8.9–10.3)
Chloride: 98 mmol/L (ref 98–111)
Creatinine, Ser: 0.57 mg/dL (ref 0.44–1.00)
GFR, Estimated: >60 mL/min (ref >60)
Glucose, Bld: 87 mg/dL (ref 70–99)
Potassium: 4.8 mmol/L (ref 3.5–5.1)
Sodium: 134 mmol/L — ABNORMAL LOW (ref 135–145)
Total Bilirubin: 1.5 mg/dL — ABNORMAL HIGH (ref 0.0–1.2)
Total Protein: 9.3 g/dL — ABNORMAL HIGH (ref 6.5–8.1)

## 2024-04-30 LAB — URINALYSIS, ROUTINE W REFLEX MICROSCOPIC
Bilirubin Urine: NEGATIVE
Glucose, UA: NEGATIVE mg/dL
Hgb urine dipstick: NEGATIVE
Ketones, ur: NEGATIVE mg/dL
Leukocytes,Ua: NEGATIVE
Nitrite: NEGATIVE
Protein, ur: NEGATIVE mg/dL
Specific Gravity, Urine: 1.006 (ref 1.005–1.030)
pH: 7 (ref 5.0–8.0)

## 2024-04-30 LAB — CBC WITH DIFFERENTIAL/PLATELET
Abs Immature Granulocytes: 0.05 K/uL (ref 0.00–0.07)
Band Neutrophils: 0 %
Basophils Absolute: 0 K/uL (ref 0.0–0.1)
Basophils Relative: 1 %
Blasts: 0 %
Eosinophils Absolute: 0.4 K/uL (ref 0.0–0.5)
Eosinophils Relative: 8 %
HCT: 26.5 % — ABNORMAL LOW (ref 36.0–46.0)
Hemoglobin: 8.5 g/dL — ABNORMAL LOW (ref 12.0–15.0)
Immature Granulocytes: 0 %
Lymphocytes Relative: 9 %
Lymphs Abs: 0.4 K/uL — ABNORMAL LOW (ref 0.7–4.0)
MCH: 25.9 pg — ABNORMAL LOW (ref 26.0–34.0)
MCHC: 32.1 g/dL (ref 30.0–36.0)
MCV: 80.8 fL (ref 80.0–100.0)
Metamyelocytes Relative: 1 %
Monocytes Absolute: 0.3 K/uL (ref 0.1–1.0)
Monocytes Relative: 6 %
Myelocytes: 0 %
Neutro Abs: 3.3 K/uL (ref 1.7–7.7)
Neutrophils Relative %: 75 %
Other: 0 %
Platelets: 302 K/uL (ref 150–400)
Promyelocytes Relative: 0 %
RBC: 3.28 MIL/uL — ABNORMAL LOW (ref 3.87–5.11)
RDW: 25.2 % — ABNORMAL HIGH (ref 11.5–15.5)
WBC: 4.5 K/uL (ref 4.0–10.5)
nRBC: 0 % (ref 0.0–0.2)
nRBC: 0 /100{WBCs}

## 2024-04-30 LAB — AFP TUMOR MARKER: AFP, Serum, Tumor Marker: 39902 ng/mL — ABNORMAL HIGH (ref 0.0–6.4)

## 2024-04-30 LAB — LIPASE, BLOOD: Lipase: 21 U/L (ref 11–51)

## 2024-04-30 LAB — T4: T4, Total: 13.7 ug/dL — ABNORMAL HIGH (ref 4.5–12.0)

## 2024-04-30 MED ORDER — KETOROLAC TROMETHAMINE 15 MG/ML IJ SOLN
15.0000 mg | Freq: Once | INTRAMUSCULAR | Status: AC
  Administered 2024-04-30: 15 mg via INTRAVENOUS
  Filled 2024-04-30: qty 1

## 2024-04-30 MED ORDER — MORPHINE SULFATE 15 MG PO TABS
7.5000 mg | ORAL_TABLET | Freq: Four times a day (QID) | ORAL | 0 refills | Status: DC | PRN
  Filled 2024-04-30: qty 15, 8d supply, fill #0

## 2024-04-30 MED ORDER — ONDANSETRON HCL 4 MG/2ML IJ SOLN
4.0000 mg | Freq: Once | INTRAMUSCULAR | Status: AC
  Administered 2024-04-30: 4 mg via INTRAVENOUS
  Filled 2024-04-30: qty 2

## 2024-04-30 MED ORDER — PROMETHAZINE HCL 12.5 MG PO TABS
12.5000 mg | ORAL_TABLET | Freq: Four times a day (QID) | ORAL | 0 refills | Status: DC | PRN
  Filled 2024-04-30: qty 20, 5d supply, fill #0

## 2024-04-30 MED ORDER — HYDROMORPHONE HCL 1 MG/ML IJ SOLN
1.0000 mg | Freq: Once | INTRAMUSCULAR | Status: AC
  Administered 2024-04-30: 1 mg via INTRAVENOUS
  Filled 2024-04-30: qty 1

## 2024-04-30 MED ORDER — IOHEXOL 300 MG/ML  SOLN
100.0000 mL | Freq: Once | INTRAMUSCULAR | Status: AC | PRN
Start: 2024-04-30 — End: 2024-04-30
  Administered 2024-04-30: 80 mL via INTRAVENOUS

## 2024-04-30 NOTE — Discharge Instructions (Addendum)
 Your lab work and imaging did not show any new acute abnormality.  You may use your oxycodone  ibuprofen  at home for pain.  Please follow-up with your cancer doctor as scheduled.  Sus anlisis de laboratorio e imgenes no mostraron ninguna anomala aguda nueva. 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 Por favor, acuda a su onclogo segn lo programado.

## 2024-04-30 NOTE — ED Notes (Signed)
PT to restroom via wheelchair.

## 2024-04-30 NOTE — ED Provider Notes (Signed)
 [place] EMERGENCY DEPARTMENT AT [facility] Provider Note   CSN: [id number] Arrival date & time: 04/30/24  1631     Patient presents with: Abdominal Pain   Christina Gutierrez is a 48 y.o. female with history of chronic hep B, hepatocellular carcinoma on radiation presents with complaints of abdominal and bilateral flank and hip pain. Denies any numbness or tingling. Pain started 2 days ago. Pain is constant. No relief with home oxycodone .     Abdominal Pain     Past Medical History:  Diagnosis Date   Cancer [facility])    Hepatocellular carcinoma (HCC) 08/22/2023   Metabolic acidosis 08/21/2023   Past Surgical History:  Procedure Laterality Date   CESAREAN SECTION     IR IMAGING GUIDED PORT INSERTION  09/22/2023     Prior to Admission medications   Medication Sig Start Date End Date Taking? Authorizing Provider  ALPRAZolam  (XANAX ) 0.5 MG tablet Take 1 tablet (0.5 mg total) by mouth at bedtime as needed for anxiety. 04/18/24   Raenelle Coria, MD  lidocaine -prilocaine  (EMLA ) cream Apply topically as needed. 04/29/24   Pasam, Chinita, MD  megestrol (MEGACE ES) 625 MG/5ML suspension Take 5 mLs (625 mg total) by mouth daily. 04/29/24   Pasam, Chinita, MD  ondansetron  (ZOFRAN ) 8 MG tablet Take 1 tablet (8 mg total) by mouth every 8 (eight) hours as needed for nausea or vomiting. 04/27/24   Lue Elsie BROCKS, MD  oxyCODONE  (ROXICODONE ) 5 MG/5ML solution Take 5 mLs (5 mg total) by mouth every 4 (four) hours as needed for up to 7 days for moderate pain (pain score 4-6) or severe pain (pain score 7-10). 04/27/24 05/04/24  Lue Elsie BROCKS, MD  pantoprazole  (PROTONIX ) 40 MG tablet Take 1 tablet (40 mg total) by mouth daily at 6 (six) AM. 04/08/24   Pasam, Avinash, MD  polyethylene glycol powder (GLYCOLAX /MIRALAX ) 17 GM/SCOOP powder Take 17 g by mouth daily. Dissolve 1 capful (17g) in 4-8 ounces of liquid and take by mouth daily. Patient taking differently: Take 17  g by mouth daily as needed for mild constipation. 04/19/24   Ghimire, Kuber, MD  prochlorperazine  (COMPAZINE ) 10 MG tablet Take 1 tablet (10 mg total) by mouth every 6 (six) hours as needed for nausea or vomiting. 04/29/24   Pasam, Chinita, MD  senna-docusate (SENOKOT-S) 8.6-50 MG tablet Take 1 tablet by mouth 2 (two) times daily. Patient taking differently: Take 1 tablet by mouth at bedtime as needed for mild constipation. 04/18/24   Ghimire, Kuber, MD  tenofovir  alafenamide (VEMLIDY ) 25 MG tablet Take 1 tablet (25 mg total) by mouth daily. 09/22/23   Fleeta Kathie Jomarie LOISE, MD    Allergies: Patient has no known allergies.    Review of Systems  Gastrointestinal:  Positive for abdominal pain.    Updated Vital Signs BP (!) 109/57 (BP Location: Left Arm)   Pulse 80   Temp 98.8 F (37.1 C) (Oral)   Resp 12   SpO2 97%   Physical Exam Vitals and nursing note reviewed.  Constitutional:      General: She is not in acute distress.    Appearance: She is well-developed.  HENT:     Head: Normocephalic and atraumatic.  Eyes:     Conjunctiva/sclera: Conjunctivae normal.  Cardiovascular:     Rate and Rhythm: Normal rate and regular rhythm.     Heart sounds: No murmur heard. Pulmonary:     Effort: Pulmonary effort is normal. No respiratory distress.     Breath  sounds: Normal breath sounds.  Abdominal:     Palpations: Abdomen is soft.     Tenderness: There is abdominal tenderness.     Comments: Generalized abdominal tenderness worse in the suprapubic region, has tenderness to bilateral flanks as well as into the lateral hips.    Musculoskeletal:        General: No swelling.     Cervical back: Neck supple.     Comments: Tolerates full hip range of motion.  DP/PT pulses 2+, compartments are   Skin:    General: Skin is warm and dry.     Capillary Refill: Capillary refill takes less than 2 seconds.  Neurological:     Mental Status: She is alert.  Psychiatric:        Mood and Affect: Mood  normal.     (all labs ordered are listed, but only abnormal results are displayed) Labs Reviewed  CBC WITH DIFFERENTIAL/PLATELET - Abnormal; Notable for the following components:      Result Value   RBC 3.28 (*)    Hemoglobin 8.5 (*)    HCT 26.5 (*)    MCH 25.9 (*)    RDW 25.2 (*)    All other components within normal limits  COMPREHENSIVE METABOLIC PANEL WITH GFR - Abnormal; Notable for the following components:   Sodium 134 (*)    BUN 5 (*)    Total Protein 9.3 (*)    AST 361 (*)    ALT 139 (*)    Alkaline Phosphatase 130 (*)    Total Bilirubin 1.5 (*)    All other components within normal limits  LIPASE, BLOOD  URINALYSIS, ROUTINE W REFLEX MICROSCOPIC  POC OCCULT BLOOD, ED    EKG: None  Radiology: CT ABDOMEN PELVIS W CONTRAST Result Date: 04/30/2024 CLINICAL DATA:  Abdominal pain, acute, nonlocalized. Abdominal and hip pain. EXAM: CT ABDOMEN AND PELVIS WITH CONTRAST TECHNIQUE: Multidetector CT imaging of the abdomen and pelvis was performed using the standard protocol following bolus administration of intravenous contrast. RADIATION DOSE REDUCTION: This exam was performed according to the departmental dose-optimization program which includes automated exposure control, adjustment of the mA and/or kV according to patient size and/or use of iterative reconstruction technique. CONTRAST:  80mL OMNIPAQUE  IOHEXOL  300 MG/ML  SOLN COMPARISON:  04/24/2024. FINDINGS: Lower chest: The distal tip of a right central venous catheter terminates in the right atrium. The heart is enlarged. Strandy atelectasis is noted at the lung bases. Hepatobiliary: The liver is enlarged with a lobular contour and multiple heterogeneous masses, similar in appearance to the prior exam. Stones are present within the gallbladder. The common bile duct is normal in caliber. Areas of mild biliary ductal dilatation are noted in the liver. Pancreas: Unremarkable. No pancreatic ductal dilatation or surrounding  inflammatory changes. Spleen: Normal in size without focal abnormality. Adrenals/Urinary Tract: The adrenal glands are not well delineated on exam. The kidneys enhance symmetrically. Cysts are noted in the right kidney. No renal calculus or hydronephrosis bilaterally. The bladder is unremarkable. Stomach/Bowel: The stomach is within normal limits. No bowel obstruction, free air, or pneumatosis is seen. A moderate amount of retained stool is present in the colon. Appendix appears normal and contains air. There is fecalization of the small bowel, possible stasis. 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 The portal vein, IVC, splenic vein, and superior mesenteric veins are patent. The aorta is normal in caliber. A few prominent lymph nodes are noted in the gastrohepatic ligament, which may be reactive. Reproductive:  Uterus and bilateral adnexa are unremarkable. Other: Small amount of free fluid is noted in the pelvis. Musculoskeletal: No acute osseous abnormality. IMPRESSION: 1. No acute intra-abdominal process. 2. Enlarged heterogeneous liver containing multiple masses, compatible with known hepatocellular carcinoma. 3. Cholelithiasis. 4. Small amount of free fluid in the pelvis. Electronically Signed   By: Leita Birmingham M.D.   On: 04/30/2024 18:56     Procedures   Medications Ordered in the ED  HYDROmorphone  (DILAUDID ) injection 1 mg (has no administration in time range)  ketorolac  (TORADOL ) 15 MG/ML injection 15 mg (has no administration in time range)  ondansetron  (ZOFRAN ) injection 4 mg (4 mg Intravenous Given 04/30/24 1728)  HYDROmorphone  (DILAUDID ) injection 1 mg (1 mg Intravenous Given 04/30/24 1729)  iohexol  (OMNIPAQUE ) 300 MG/ML solution 100 mL (80 mLs Intravenous Contrast Given 04/30/24 1839)    Clinical Course as of 04/30/24 1930  Sat Apr 30, 2024  1716 Patient with history of hepatocellular carcinoma and chronic hep B evaluated for complaints of abdominal pain  that radiates to her flanks and hips.  Is not associate with any nausea, vomiting or diarrhea.  She is hemodynamically stable.  Appears uncomfortable, tearful during exam.  She has generalized abdominal tenderness worse to the suprapubic region.  Additionally has tenderness to bilateral flanks and bilateral lateral hips. [JT]  1727 Urinalysis, Routine w reflex microscopic -Urine, Clean Catch Unremarkable [JT]  1830 Lipase, blood Within normal limits [JT]  1831 Comprehensive metabolic panel(!) Transaminitis with AST 361 and ALT of 139, alk phos of 130 total bili 1.5 [JT]  1831 CBC with Differential(!) No leukocytosis, hemoglobin stable 8.5 [JT]  1917 Workup is overall without any acute abnormality.  Suspect that her symptoms are secondary to known hepatocellular carcinoma.  Patient's pain is controlled and is interested in discharge.  She has a follow-up with oncology on Monday.  She has pain medications at home that she will utilize.  Strict return precautions provided. [JT]    Clinical Course User Index [JT] Donnajean Lynwood DEL, PA-C                                 Medical Decision Making Amount and/or Complexity of Data Reviewed Labs: ordered. Radiology: ordered.  Risk Prescription drug management.   This patient presents to the ED with chief complaint(s) of abd pain .  The complaint involves an extensive differential diagnosis and also carries with it a high risk of complications and morbidity.   Pertinent past medical history as listed in HPI  The differential diagnosis includes  Fracture, dislocation, sprain Additional history obtained: Additional history obtained from family Records reviewed Care Everywhere/External Records  Disposition:   Patient will be discharged home. The patient has been appropriately medically screened and/or stabilized in the ED. I have low suspicion for any other emergent medical condition which would require further screening, evaluation or treatment  in the ED or require inpatient management. At time of discharge the patient is hemodynamically stable and in no acute distress. I have discussed work-up results and diagnosis with patient and answered all questions. Patient is agreeable with discharge plan. We discussed strict return precautions for returning to the emergency department and they verbalized understanding.     Social Determinants of Health:   none  This note was dictated with voice recognition software.  Despite best efforts at proofreading, errors may have occurred which can change the documentation meaning.  Final diagnoses:  Generalized abdominal pain  Bilateral hip pain    ED Discharge Orders     None          Donnajean Lynwood VEAR DEVONNA 04/30/24 1930    Garrick Charleston, MD 04/30/24 1949

## 2024-04-30 NOTE — ED Triage Notes (Signed)
 Pt reports right sided abdominal pain and right leg pain. Pt reports home pain medication not working.

## 2024-05-01 ENCOUNTER — Encounter (HOSPITAL_COMMUNITY): Payer: Self-pay | Admitting: Oncology

## 2024-05-01 ENCOUNTER — Encounter: Payer: Self-pay | Admitting: Oncology

## 2024-05-01 ENCOUNTER — Other Ambulatory Visit (HOSPITAL_COMMUNITY): Payer: Self-pay

## 2024-05-02 ENCOUNTER — Other Ambulatory Visit (HOSPITAL_COMMUNITY): Payer: Self-pay

## 2024-05-02 ENCOUNTER — Ambulatory Visit
Admission: RE | Admit: 2024-05-02 | Discharge: 2024-05-02 | Disposition: A | Discharge status: Home / Self Care | Payer: Self-pay | Source: Ambulatory Visit | Attending: Radiation Oncology | Admitting: Radiation Oncology

## 2024-05-02 ENCOUNTER — Ambulatory Visit (INDEPENDENT_AMBULATORY_CARE_PROVIDER_SITE_OTHER): Payer: Self-pay

## 2024-05-02 ENCOUNTER — Ambulatory Visit: Payer: Self-pay

## 2024-05-02 ENCOUNTER — Other Ambulatory Visit: Payer: Self-pay

## 2024-05-02 VITALS — BP 115/72 | HR 77 | Temp 98.3°F | Resp 18 | Ht 60.0 in | Wt 107.6 lb

## 2024-05-02 DIAGNOSIS — E860 Dehydration: Secondary | ICD-10-CM

## 2024-05-02 DIAGNOSIS — C220 Liver cell carcinoma: Secondary | ICD-10-CM

## 2024-05-02 LAB — RAD ONC ARIA SESSION SUMMARY
Course Elapsed Days: 5
Plan Fractions Treated to Date: 4
Plan Prescribed Dose Per Fraction: 3 Gy
Plan Total Fractions Prescribed: 7
Plan Total Prescribed Dose: 21 Gy
Reference Point Dosage Given to Date: 12 Gy
Reference Point Session Dosage Given: 3 Gy
Session Number: 4

## 2024-05-02 MED ORDER — HEPARIN SOD (PORK) LOCK FLUSH 100 UNIT/ML IV SOLN
500.0000 [IU] | Freq: Once | INTRAVENOUS | Status: AC | PRN
  Administered 2024-05-02: 500 [IU]
  Filled 2024-05-02: qty 5

## 2024-05-02 MED ORDER — SODIUM CHLORIDE 0.9 % IV BOLUS
1000.0000 mL | Freq: Once | INTRAVENOUS | Status: AC
  Administered 2024-05-02: 1000 mL via INTRAVENOUS
  Filled 2024-05-02: qty 1000

## 2024-05-02 MED ORDER — ONDANSETRON HCL 4 MG/2ML IJ SOLN
8.0000 mg | Freq: Once | INTRAMUSCULAR | Status: AC
  Administered 2024-05-02: 8 mg via INTRAVENOUS
  Filled 2024-05-02: qty 4

## 2024-05-02 NOTE — Progress Notes (Signed)
 Diagnosis: Dehydration  Provider:  Praveen Mannam MD  Procedure: IV Infusion  IV Type: Port a Cath, IV Location: R Chest  Normal Saline, Dose: 1000 ml  Infusion Start Time: 1111  Infusion Stop Time: 1330  Post Infusion IV Care: Port a Cath Deaccessed/Flushed and heparin  locked .  Discharge: Condition: Good, Destination: Home . AVS Declined  Performed by:  Liala Codispoti, RN

## 2024-05-03 ENCOUNTER — Other Ambulatory Visit (HOSPITAL_COMMUNITY): Payer: Self-pay

## 2024-05-03 ENCOUNTER — Ambulatory Visit
Admission: RE | Admit: 2024-05-03 | Discharge: 2024-05-03 | Disposition: A | Payer: Self-pay | Source: Ambulatory Visit | Attending: Radiation Oncology

## 2024-05-03 ENCOUNTER — Other Ambulatory Visit: Payer: Self-pay

## 2024-05-03 LAB — RAD ONC ARIA SESSION SUMMARY
Course Elapsed Days: 6
Plan Fractions Treated to Date: 5
Plan Prescribed Dose Per Fraction: 3 Gy
Plan Total Fractions Prescribed: 7
Plan Total Prescribed Dose: 21 Gy
Reference Point Dosage Given to Date: 15 Gy
Reference Point Session Dosage Given: 3 Gy
Session Number: 5

## 2024-05-04 ENCOUNTER — Encounter (HOSPITAL_COMMUNITY): Payer: Self-pay | Admitting: Emergency Medicine

## 2024-05-04 ENCOUNTER — Emergency Department (HOSPITAL_COMMUNITY)
Admission: EM | Admit: 2024-05-04 | Discharge: 2024-05-04 | Disposition: A | Discharge status: Home / Self Care | Payer: Self-pay | Attending: Emergency Medicine | Admitting: Emergency Medicine

## 2024-05-04 ENCOUNTER — Encounter (HOSPITAL_COMMUNITY): Payer: Self-pay | Admitting: Oncology

## 2024-05-04 ENCOUNTER — Other Ambulatory Visit (HOSPITAL_COMMUNITY): Payer: Self-pay

## 2024-05-04 ENCOUNTER — Other Ambulatory Visit: Payer: Self-pay

## 2024-05-04 ENCOUNTER — Emergency Department (HOSPITAL_COMMUNITY): Payer: Self-pay

## 2024-05-04 ENCOUNTER — Encounter: Payer: Self-pay | Admitting: Oncology

## 2024-05-04 ENCOUNTER — Ambulatory Visit: Admission: RE | Admit: 2024-05-04 | Payer: Self-pay | Source: Ambulatory Visit

## 2024-05-04 DIAGNOSIS — M5442 Lumbago with sciatica, left side: Secondary | ICD-10-CM | POA: Insufficient documentation

## 2024-05-04 DIAGNOSIS — M5441 Lumbago with sciatica, right side: Secondary | ICD-10-CM | POA: Insufficient documentation

## 2024-05-04 DIAGNOSIS — Z8505 Personal history of malignant neoplasm of liver: Secondary | ICD-10-CM | POA: Insufficient documentation

## 2024-05-04 LAB — COMPREHENSIVE METABOLIC PANEL WITH GFR
ALT: 122 U/L — ABNORMAL HIGH (ref 0–44)
AST: 304 U/L — ABNORMAL HIGH (ref 15–41)
Albumin: 3.8 g/dL (ref 3.5–5.0)
Alkaline Phosphatase: 113 U/L (ref 38–126)
Anion gap: 14 (ref 5–15)
BUN: 7 mg/dL (ref 6–20)
CO2: 22 mmol/L (ref 22–32)
Calcium: 10 mg/dL (ref 8.9–10.3)
Chloride: 97 mmol/L — ABNORMAL LOW (ref 98–111)
Creatinine, Ser: 0.56 mg/dL (ref 0.44–1.00)
GFR, Estimated: >60 mL/min (ref >60)
Glucose, Bld: 141 mg/dL — ABNORMAL HIGH (ref 70–99)
Potassium: 4.5 mmol/L (ref 3.5–5.1)
Sodium: 133 mmol/L — ABNORMAL LOW (ref 135–145)
Total Bilirubin: 1.5 mg/dL — ABNORMAL HIGH (ref 0.0–1.2)
Total Protein: 8.5 g/dL — ABNORMAL HIGH (ref 6.5–8.1)

## 2024-05-04 LAB — CBC WITH DIFFERENTIAL/PLATELET
Abs Immature Granulocytes: 0.04 K/uL (ref 0.00–0.07)
Basophils Absolute: 0.1 K/uL (ref 0.0–0.1)
Basophils Relative: 2 %
Eosinophils Absolute: 0.2 K/uL (ref 0.0–0.5)
Eosinophils Relative: 6 %
HCT: 24.2 % — ABNORMAL LOW (ref 36.0–46.0)
Hemoglobin: 8 g/dL — ABNORMAL LOW (ref 12.0–15.0)
Immature Granulocytes: 1 %
Lymphocytes Relative: 7 %
Lymphs Abs: 0.2 K/uL — ABNORMAL LOW (ref 0.7–4.0)
MCH: 27.8 pg (ref 26.0–34.0)
MCHC: 33.1 g/dL (ref 30.0–36.0)
MCV: 84 fL (ref 80.0–100.0)
Monocytes Absolute: 0.3 K/uL (ref 0.1–1.0)
Monocytes Relative: 8 %
Neutro Abs: 2.7 K/uL (ref 1.7–7.7)
Neutrophils Relative %: 76 %
Platelets: 302 K/uL (ref 150–400)
RBC: 2.88 MIL/uL — ABNORMAL LOW (ref 3.87–5.11)
RDW: 27.8 % — ABNORMAL HIGH (ref 11.5–15.5)
WBC: 3.5 K/uL — ABNORMAL LOW (ref 4.0–10.5)
nRBC: 0 % (ref 0.0–0.2)

## 2024-05-04 LAB — HCG, SERUM, QUALITATIVE: Preg, Serum: NEGATIVE

## 2024-05-04 MED ORDER — LIDOCAINE 5 % EX PTCH
1.0000 | MEDICATED_PATCH | CUTANEOUS | 0 refills | Status: DC
  Filled 2024-05-04: qty 30, 30d supply, fill #0

## 2024-05-04 MED ORDER — HYDROCODONE-ACETAMINOPHEN 5-325 MG PO TABS
2.0000 | ORAL_TABLET | ORAL | 0 refills | Status: DC | PRN
  Filled 2024-05-04: qty 10, 1d supply, fill #0

## 2024-05-04 MED ORDER — MORPHINE SULFATE (PF) 4 MG/ML IV SOLN
4.0000 mg | Freq: Once | INTRAVENOUS | Status: AC
Start: 2024-05-04 — End: 2024-05-04
  Administered 2024-05-04: 4 mg via INTRAVENOUS
  Filled 2024-05-04: qty 1

## 2024-05-04 MED ORDER — MORPHINE SULFATE (PF) 4 MG/ML IV SOLN
4.0000 mg | Freq: Once | INTRAVENOUS | Status: AC
  Administered 2024-05-04: 4 mg via INTRAVENOUS
  Filled 2024-05-04: qty 1

## 2024-05-04 NOTE — ED Provider Notes (Signed)
 [place] EMERGENCY DEPARTMENT AT [facility] Provider Note   CSN: [id number] Arrival date & time: 05/04/24  1214     Patient presents with: Back Pain   Christina Gutierrez is a 48 y.o. female.  Patient with past history significant for hepatocellular carcinoma, hepatic failure presents to the emergency department today with concerns of back pain.  She endorses feelings of back pain ongoing for the last 5 days from the mid to low back with radiation towards bilateral lower legs.  Denies any bowel or bladder incontinence.  No reported saddle paresthesia.  She states he feels pain radiating in bilateral lower legs.    Back Pain      Prior to Admission medications   Medication Sig Start Date End Date Taking? Authorizing Provider  HYDROcodone -acetaminophen  (NORCO/VICODIN) 5-325 MG tablet Take 2 tablets by mouth every 4 (four) hours as needed. 05/04/24  Yes Karrissa Parchment A, PA-C  lidocaine  (LIDODERM ) 5 % Place 1 patch onto the skin daily. Remove & Discard patch within 12 hours or as directed by MD 05/04/24  Yes Tessy Pawelski A, PA-C  ALPRAZolam  (XANAX ) 0.5 MG tablet Take 1 tablet (0.5 mg total) by mouth at bedtime as needed for anxiety. 04/18/24   Ghimire, Kuber, MD  lidocaine -prilocaine  (EMLA ) cream Apply topically as needed. 04/29/24   Pasam, Chinita, MD  megestrol (MEGACE ES) 625 MG/5ML suspension Take 5 mLs (625 mg total) by mouth daily. 04/29/24   Pasam, Chinita, MD  morphine  (MSIR) 15 MG tablet Take 0.5 tablets (7.5 mg total) by mouth every 6 (six) hours as needed for severe pain (pain score 7-10). 04/30/24   Garrick Charleston, MD  ondansetron  (ZOFRAN ) 8 MG tablet Take 1 tablet (8 mg total) by mouth every 8 (eight) hours as needed for nausea or vomiting. 04/27/24   Lue Elsie BROCKS, MD  oxyCODONE  (ROXICODONE ) 5 MG/5ML solution Take 5 mLs (5 mg total) by mouth every 4 (four) hours as needed for up to 7 days for moderate pain (pain score 4-6) or severe pain (pain  score 7-10). 04/27/24 05/04/24  Lue Elsie BROCKS, MD  pantoprazole  (PROTONIX ) 40 MG tablet Take 1 tablet (40 mg total) by mouth daily at 6 (six) AM. 04/08/24   Pasam, Avinash, MD  polyethylene glycol powder (GLYCOLAX /MIRALAX ) 17 GM/SCOOP powder Take 17 g by mouth daily. Dissolve 1 capful (17g) in 4-8 ounces of liquid and take by mouth daily. Patient taking differently: Take 17 g by mouth daily as needed for mild constipation. 04/19/24   Ghimire, Kuber, MD  prochlorperazine  (COMPAZINE ) 10 MG tablet Take 1 tablet (10 mg total) by mouth every 6 (six) hours as needed for nausea or vomiting. 04/29/24   Pasam, Avinash, MD  promethazine (PHENERGAN) 12.5 MG tablet Take 1 tablet (12.5 mg total) by mouth every 6 (six) hours as needed for nausea or vomiting. 04/30/24   Garrick Charleston, MD  senna-docusate (SENOKOT-S) 8.6-50 MG tablet Take 1 tablet by mouth 2 (two) times daily. Patient taking differently: Take 1 tablet by mouth at bedtime as needed for mild constipation. 04/18/24   Ghimire, Kuber, MD  tenofovir  alafenamide (VEMLIDY ) 25 MG tablet Take 1 tablet (25 mg total) by mouth daily. 09/22/23   Fleeta Kathie Jomarie LOISE, MD    Allergies: Patient has no known allergies.    Review of Systems  Musculoskeletal:  Positive for back pain.  All other systems reviewed and are negative.   Updated Vital Signs BP 116/71   Pulse 80   Temp 98.3 F (36.8  C) (Oral)   Resp 18   SpO2 97%   Physical Exam Vitals and nursing note reviewed.  Constitutional:      General: She is not in acute distress.    Appearance: She is well-developed.  HENT:     Head: Normocephalic and atraumatic.  Eyes:     Conjunctiva/sclera: Conjunctivae normal.  Cardiovascular:     Rate and Rhythm: Normal rate and regular rhythm.     Heart sounds: No murmur heard. Pulmonary:     Effort: Pulmonary effort is normal. No respiratory distress.     Breath sounds: Normal breath sounds.  Abdominal:     Palpations: Abdomen is soft.      Tenderness: There is no abdominal tenderness.  Musculoskeletal:        General: Tenderness present. No swelling, deformity or signs of injury. Normal range of motion.     Cervical back: Neck supple.     Comments: Tenderness palpation along the entirety of the mid to low back.  Radiating symptoms with straight leg elevation.  Skin:    General: Skin is warm and dry.     Capillary Refill: Capillary refill takes less than 2 seconds.  Neurological:     Mental Status: She is alert.     Deep Tendon Reflexes:     Reflex Scores:      Patellar reflexes are 2+ on the right side and 2+ on the left side.      Achilles reflexes are 2+ on the right side and 2+ on the left side. Psychiatric:        Mood and Affect: Mood normal.     (all labs ordered are listed, but only abnormal results are displayed) Labs Reviewed  CBC WITH DIFFERENTIAL/PLATELET - Abnormal; Notable for the following components:      Result Value   WBC 3.5 (*)    RBC 2.88 (*)    Hemoglobin 8.0 (*)    HCT 24.2 (*)    RDW 27.8 (*)    Lymphs Abs 0.2 (*)    All other components within normal limits  COMPREHENSIVE METABOLIC PANEL WITH GFR - Abnormal; Notable for the following components:   Sodium 133 (*)    Chloride 97 (*)    Glucose, Bld 141 (*)    Total Protein 8.5 (*)    AST 304 (*)    ALT 122 (*)    Total Bilirubin 1.5 (*)    All other components within normal limits  HCG, SERUM, QUALITATIVE    EKG: None  Radiology: CT Thoracic Spine Wo Contrast Result Date: 05/04/2024 EXAM: CT THORACIC SPINE WITHOUT CONTRAST 05/04/2024 01:07:19 PM TECHNIQUE: CT of the thoracic spine was performed without the administration of intravenous contrast. Multiplanar reformatted images are provided for review. Automated exposure control, iterative reconstruction, and/or weight based adjustment of the mA/kV was utilized to reduce the radiation dose to as low as reasonably achievable. COMPARISON: CTA chest 01/18/2024. CLINICAL HISTORY: Back  trauma, no prior imaging (Age >= 16y). FINDINGS: BONES AND ALIGNMENT: Normal vertebral body heights. Alignment is maintained. Partial fusion of the T3 and T4 vertebral bodies and posterior elements. No acute fracture or suspicious bone lesion. DEGENERATIVE CHANGES: There is no CT evidence of large disc herniation or high grade osseous spinal canal stenosis. SOFT TISSUES: Partially visualized right IJ approach central venous catheter. The paraspinal soft tissues are unremarkable. LIVER: Partially visualized masses within the right hepatic lobe better evaluated on the prior CT from July 2025. IMPRESSION: 1. No acute abnormality  of the thoracic spine related to trauma. 2. Partial fusion of the T3 and T4 vertebral bodies and posterior elements. Electronically signed by: Donnice Mania MD 05/04/2024 02:23 PM EDT RP Workstation: [id number]   CT Lumbar Spine Wo Contrast Result Date: 05/04/2024 EXAM: CT of the Lumbar Spine Without Contrast 05/04/2024 01:07:19 PM TECHNIQUE: CT of the lumbar spine was performed without the administration of intravenous contrast. Multiplanar reformatted images are provided for review. Automated exposure control, iterative reconstruction, and/or weight based adjustment of the mA/kV was utilized to reduce the radiation dose to as low as reasonably achievable. COMPARISON: None available. CLINICAL HISTORY: Back trauma, no prior imaging (Age >= 16y). FINDINGS: BONES AND ALIGNMENT: Normal vertebral body heights. No acute fracture or suspicious bone lesion. Normal alignment. DEGENERATIVE CHANGES: No significant degenerative changes. SOFT TISSUES: No acute abnormality. IMPRESSION: 1. Unremarkable CT of the lumbar spine Electronically signed by: Norman Gatlin MD 05/04/2024 02:22 PM EDT RP Workstation: [id number]     Procedures   Medications Ordered in the ED  morphine  (PF) 4 MG/ML injection 4 mg (4 mg Intravenous Given 05/04/24 1336)  morphine  (PF) 4 MG/ML injection 4 mg (4 mg Intravenous  Given 05/04/24 1526)                                    Medical Decision Making Amount and/or Complexity of Data Reviewed Labs: ordered. Radiology: ordered.  Risk Prescription drug management.   This patient presents to the ED for concern of back pain, this involves an extensive number of treatment options, and is a complaint that carries with it a high risk of complications and morbidity.  The differential diagnosis includes radiculopathy, metastatic spread of primary hepatic cancer to the back   Co morbidities that complicate the patient evaluation  Hepatocellular carcinoma   Additional history obtained:  Additional history obtained from epic chart review   Lab Tests:  I Ordered, and personally interpreted labs.  The pertinent results include: CBC shows baseline hemoglobin 8.0, CMP with transaminitis consistent with hepatocellular carcinoma otherwise unremarkable   Imaging Studies ordered:  I ordered imaging studies including CT lumbar, CT thoracic spine I independently visualized and interpreted imaging which showed negative for any acute findings to explain symptoms I agree with the radiologist interpretation   Cardiac Monitoring: / EKG:  The patient was maintained on a cardiac monitor.  I personally viewed and interpreted the cardiac monitored which showed an underlying rhythm of: None   Consultations Obtained:  I requested consultation with none,  and discussed lab and imaging findings as well as pertinent plan - they recommend: N/A   Problem List / ED Course / Critical interventions / Medication management  Patient presented to the emergency department today with concerns of back pain.  She has a history of hepatocellular carcinoma, hepatic failure and reports that she began to experience worsening back pain about 5 days ago.  She has been seen in the emergency department somewhat recently on 04/30/2024 without any specific findings seen for her pain in her  abdomen or hips at that time.  Has been trying to manage at pain at home with minimal improvement.  Denies any saddle paresthesia, bowel or bladder incontinence. Physical exam reveals primarily midline tenderness in the lumbar and mid back.  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  CT abdomen pelvis from 10/25 did not show any obvious findings at that time.  Will obtain dedicated  thoracic and lumbar views for further assessment.  Morphine  given for pain control. Patient's labs are at baseline.  CT thoracic and lumbar spines negative for any acute findings. On reevaluation, patient reports improvement in pain.  Will discharge home with Norco for pain control.  Advise close follow-up with oncologist and PCP for further assessment.  She is otherwise stable for outpatient follow-up and discharged home. I ordered medication including morphine  for pain Reevaluation of the patient after these medicines showed that the patient improved I have reviewed the patients home medicines and have made adjustments as needed   Social Determinants of Health:  Currently being treated for hepatocellular carcinoma   Test / Admission - Considered:  Admission considered but stable for outpatient follow-up.  Final diagnoses:  Acute midline low back pain with bilateral sciatica    ED Discharge Orders          Ordered    HYDROcodone -acetaminophen  (NORCO/VICODIN) 5-325 MG tablet  Every 4 hours PRN        05/04/24 1502    lidocaine  (LIDODERM ) 5 %  Every 24 hours        05/04/24 1502               Geran Haithcock A, PA-C 05/04/24 2239    Laurice Maude BROCKS, MD 05/06/24 [phone]

## 2024-05-04 NOTE — ED Triage Notes (Addendum)
 Patient presents due to back pain for five days. Pain is located in the mid back and low back that radiates into bilateral legs. Pain keeps her from walking. Denies changes in bowel and bladder. Patient's last chemo was 2 months ago. She had an appointment for another round of chemo today.

## 2024-05-04 NOTE — Discharge Instructions (Signed)
 Hoy acudi al servicio de urgencias por dolor de espalda. Afortunadamente, los anlisis y las pruebas de imagen fueron tranquilizadores y estoy seguro de por qu su dolor se ha intensificado. Le recomiendo que mantenga una comunicacin fluida con su onclogo. Vuelva al servicio de urgencias si tiene alguna duda o si sus sntomas empeoran. Siga tomando su medicacin habitual segn lo prescrito.  You were seen in the emergency department today for concerns of back pain.  Your labs, imaging were thankfully reassuring and I am sure exactly why your back pain has become so severe.  I would recommend following up closely with your oncologist.  Return to the emergency department for any concerns or worsening symptoms.  Continue to take your home medications as prescribed.

## 2024-05-05 ENCOUNTER — Other Ambulatory Visit (HOSPITAL_COMMUNITY): Payer: Self-pay

## 2024-05-05 ENCOUNTER — Other Ambulatory Visit: Payer: Self-pay

## 2024-05-05 ENCOUNTER — Encounter (HOSPITAL_COMMUNITY): Payer: Self-pay | Admitting: Oncology

## 2024-05-05 ENCOUNTER — Encounter: Payer: Self-pay | Admitting: Oncology

## 2024-05-05 ENCOUNTER — Encounter: Payer: Self-pay | Admitting: Nurse Practitioner

## 2024-05-05 ENCOUNTER — Ambulatory Visit (INDEPENDENT_AMBULATORY_CARE_PROVIDER_SITE_OTHER): Payer: Self-pay

## 2024-05-05 ENCOUNTER — Ambulatory Visit: Admission: RE | Admit: 2024-05-05 | Discharge: 2024-05-05 | Payer: Self-pay | Attending: Radiation Oncology

## 2024-05-05 ENCOUNTER — Inpatient Hospital Stay (HOSPITAL_BASED_OUTPATIENT_CLINIC_OR_DEPARTMENT_OTHER): Payer: Self-pay | Admitting: Nurse Practitioner

## 2024-05-05 VITALS — BP 98/63 | HR 75 | Temp 97.6°F | Resp 18 | Ht 60.0 in | Wt 105.0 lb

## 2024-05-05 DIAGNOSIS — G893 Neoplasm related pain (acute) (chronic): Secondary | ICD-10-CM

## 2024-05-05 DIAGNOSIS — R634 Abnormal weight loss: Secondary | ICD-10-CM

## 2024-05-05 DIAGNOSIS — R530 Neoplastic (malignant) related fatigue: Secondary | ICD-10-CM

## 2024-05-05 DIAGNOSIS — Z515 Encounter for palliative care: Secondary | ICD-10-CM

## 2024-05-05 DIAGNOSIS — Z7189 Other specified counseling: Secondary | ICD-10-CM

## 2024-05-05 DIAGNOSIS — F5109 Other insomnia not due to a substance or known physiological condition: Secondary | ICD-10-CM

## 2024-05-05 DIAGNOSIS — C220 Liver cell carcinoma: Secondary | ICD-10-CM

## 2024-05-05 DIAGNOSIS — E860 Dehydration: Secondary | ICD-10-CM

## 2024-05-05 DIAGNOSIS — R630 Anorexia: Secondary | ICD-10-CM

## 2024-05-05 LAB — RAD ONC ARIA SESSION SUMMARY
Course Elapsed Days: 8
Plan Fractions Treated to Date: 6
Plan Prescribed Dose Per Fraction: 3 Gy
Plan Total Fractions Prescribed: 7
Plan Total Prescribed Dose: 21 Gy
Reference Point Dosage Given to Date: 18 Gy
Reference Point Session Dosage Given: 3 Gy
Session Number: 6

## 2024-05-05 MED ORDER — HEPARIN SOD (PORK) LOCK FLUSH 100 UNIT/ML IV SOLN
500.0000 [IU] | Freq: Once | INTRAVENOUS | Status: AC | PRN
  Administered 2024-05-05: 500 [IU]
  Filled 2024-05-05: qty 5

## 2024-05-05 MED ORDER — MORPHINE SULFATE 15 MG PO TABS
15.0000 mg | ORAL_TABLET | Freq: Four times a day (QID) | ORAL | 0 refills | Status: DC | PRN
  Filled 2024-05-07: qty 45, 12d supply, fill #0

## 2024-05-05 MED ORDER — ONDANSETRON HCL 4 MG/2ML IJ SOLN
8.0000 mg | Freq: Once | INTRAMUSCULAR | Status: AC
  Administered 2024-05-05: 8 mg via INTRAVENOUS
  Filled 2024-05-05: qty 4

## 2024-05-05 MED ORDER — SODIUM CHLORIDE 0.9 % IV BOLUS
1000.0000 mL | Freq: Once | INTRAVENOUS | Status: AC
  Administered 2024-05-05: 1000 mL via INTRAVENOUS
  Filled 2024-05-05: qty 1000

## 2024-05-05 MED ORDER — DEXAMETHASONE 4 MG PO TABS
ORAL_TABLET | ORAL | 0 refills | Status: DC
  Filled 2024-05-05: qty 20, 15d supply, fill #0

## 2024-05-05 NOTE — Progress Notes (Signed)
 Palliative Medicine [facility]  [phone] [phone]   Name: Christina Gutierrez Date: 05/05/2024 MRN: [id number]  DOB: 10/25/1975  Patient Care Team: [name], MD as PCP - General (Oncology)    REASON FOR CONSULTATION: Christina Gutierrez is a 48 y.o. female with oncologic medical history including Hepatocellular carcinoma.  Palliative is seeing patient for symptom management and goals of care.    SOCIAL HISTORY:     reports that she has never smoked. She has never used smokeless tobacco. She reports that she does not drink alcohol and does not use drugs.  ADVANCE DIRECTIVES:  None on file   CODE STATUS: Full code  PAST MEDICAL HISTORY: Past Medical History:  Diagnosis Date   Cancer (HCC)    Hepatocellular carcinoma (HCC) 08/22/2023   Metabolic acidosis 08/21/2023    PAST SURGICAL HISTORY:  Past Surgical History:  Procedure Laterality Date   CESAREAN SECTION     IR IMAGING GUIDED PORT INSERTION  09/22/2023    HEMATOLOGY/ONCOLOGY HISTORY:  Oncology History  Hepatocellular carcinoma (HCC)  08/22/2023 Initial Diagnosis   Hepatocellular carcinoma (HCC)   08/27/2023 Cancer Staging   Staging form: Liver, AJCC 8th Edition - Clinical: Stage IIIA (cT3, cN0, cM0) - Signed by [name], MD on 08/27/2023   09/11/2023 -  Chemotherapy   Patient is on Treatment Plan : HEPATOCELLULAR Atezolizumab  + Bevacizumab  q21d       ALLERGIES:  has no known allergies.  MEDICATIONS:  Current Outpatient Medications  Medication Sig Dispense Refill   dexamethasone (DECADRON) 4 MG tablet 1 tablet (4mg ) twice daily x 5 days, then one tablet (4 mg) x 5 days 20 tablet 0   ALPRAZolam  (XANAX ) 0.5 MG tablet Take 1 tablet (0.5 mg total) by mouth at bedtime as needed for anxiety. 30 tablet 0   lidocaine  (LIDODERM ) 5 % Place 1 patch onto the skin daily. Remove & Discard patch within 12 hours or as directed by MD 30 patch 0    lidocaine -prilocaine  (EMLA ) cream Apply topically as needed. 30 g 3   megestrol (MEGACE ES) 625 MG/5ML suspension Take 5 mLs (625 mg total) by mouth daily. 150 mL 2   [START ON 05/07/2024] morphine  (MSIR) 15 MG tablet Take 1 tablet (15 mg total) by mouth every 6 (six) hours as needed for severe pain (pain score 7-10). 45 tablet 0   ondansetron  (ZOFRAN ) 8 MG tablet Take 1 tablet (8 mg total) by mouth every 8 (eight) hours as needed for nausea or vomiting. 60 tablet 0   pantoprazole  (PROTONIX ) 40 MG tablet Take 1 tablet (40 mg total) by mouth daily at 6 (six) AM. 30 tablet 2   polyethylene glycol powder (GLYCOLAX /MIRALAX ) 17 GM/SCOOP powder Take 17 g by mouth daily. Dissolve 1 capful (17g) in 4-8 ounces of liquid and take by mouth daily. (Patient taking differently: Take 17 g by mouth daily as needed for mild constipation.) 238 g 0   prochlorperazine  (COMPAZINE ) 10 MG tablet Take 1 tablet (10 mg total) by mouth every 6 (six) hours as needed for nausea or vomiting. 30 tablet 1   promethazine (PHENERGAN) 12.5 MG tablet Take 1 tablet (12.5 mg total) by mouth every 6 (six) hours as needed for nausea or vomiting. 20 tablet 0   senna-docusate (SENOKOT-S) 8.6-50 MG tablet Take 1 tablet by mouth 2 (two) times daily. (Patient taking differently: Take 1 tablet by mouth at bedtime as needed for mild constipation.) 60 tablet 0   tenofovir  alafenamide (  VEMLIDY ) 25 MG tablet Take 1 tablet (25 mg total) by mouth daily. 30 tablet 11   No current facility-administered medications for this visit.    VITAL SIGNS: There were no vitals taken for this visit. There were no vitals filed for this visit.  Estimated body mass index is 20.51 kg/m as calculated from the following:   Height as of an earlier encounter on 05/05/24: 5' (1.524 m).   Weight as of an earlier encounter on 05/05/24: 105 lb (47.6 kg).  LABS: CBC:    Component Value Date/Time   WBC 3.5 (L) 05/04/2024 1250   HGB 8.0 (L) 05/04/2024 1250   HGB 8.2  (L) 04/29/2024 0827   HCT 24.2 (L) 05/04/2024 1250   PLT 302 05/04/2024 1250   PLT 314 04/29/2024 0827   MCV 84.0 05/04/2024 1250   NEUTROABS 2.7 05/04/2024 1250   LYMPHSABS 0.2 (L) 05/04/2024 1250   MONOABS 0.3 05/04/2024 1250   EOSABS 0.2 05/04/2024 1250   BASOSABS 0.1 05/04/2024 1250   Comprehensive Metabolic Panel:    Component Value Date/Time   NA 133 (L) 05/04/2024 1250   K 4.5 05/04/2024 1250   CL 97 (L) 05/04/2024 1250   CO2 22 05/04/2024 1250   BUN 7 05/04/2024 1250   CREATININE 0.56 05/04/2024 1250   CREATININE 0.54 04/29/2024 0827   GLUCOSE 141 (H) 05/04/2024 1250   CALCIUM 10.0 05/04/2024 1250   AST 304 (H) 05/04/2024 1250   AST 273 (HH) 04/29/2024 0827   ALT 122 (H) 05/04/2024 1250   ALT 117 (H) 04/29/2024 0827   ALKPHOS 113 05/04/2024 1250   BILITOT 1.5 (H) 05/04/2024 1250   BILITOT 1.5 (H) 04/29/2024 0827   PROT 8.5 (H) 05/04/2024 1250   ALBUMIN 3.8 05/04/2024 1250    RADIOGRAPHIC STUDIES: CT Thoracic Spine Wo Contrast Result Date: 05/04/2024 EXAM: CT THORACIC SPINE WITHOUT CONTRAST 05/04/2024 01:07:19 PM TECHNIQUE: CT of the thoracic spine was performed without the administration of intravenous contrast. Multiplanar reformatted images are provided for review. Automated exposure control, iterative reconstruction, and/or weight based adjustment of the mA/kV was utilized to reduce the radiation dose to as low as reasonably achievable. COMPARISON: CTA chest 01/18/2024. CLINICAL HISTORY: Back trauma, no prior imaging (Age >= 16y). FINDINGS: BONES AND ALIGNMENT: Normal vertebral body heights. Alignment is maintained. Partial fusion of the T3 and T4 vertebral bodies and posterior elements. No acute fracture or suspicious bone lesion. DEGENERATIVE CHANGES: There is no CT evidence of large disc herniation or high grade osseous spinal canal stenosis. SOFT TISSUES: Partially visualized right IJ approach central venous catheter. The paraspinal soft tissues are unremarkable.  LIVER: Partially visualized masses within the right hepatic lobe better evaluated on the prior CT from July 2025. IMPRESSION: 1. No acute abnormality of the thoracic spine related to trauma. 2. Partial fusion of the T3 and T4 vertebral bodies and posterior elements. Electronically signed by: Donnice Mania MD 05/04/2024 02:23 PM EDT RP Workstation: [id number]   CT Lumbar Spine Wo Contrast Result Date: 05/04/2024 EXAM: CT of the Lumbar Spine Without Contrast 05/04/2024 01:07:19 PM TECHNIQUE: CT of the lumbar spine was performed without the administration of intravenous contrast. Multiplanar reformatted images are provided for review. Automated exposure control, iterative reconstruction, and/or weight based adjustment of the mA/kV was utilized to reduce the radiation dose to as low as reasonably achievable. COMPARISON: None available. CLINICAL HISTORY: Back trauma, no prior imaging (Age >= 16y). FINDINGS: BONES AND ALIGNMENT: Normal vertebral body heights. No acute fracture or suspicious bone lesion.  Normal alignment. DEGENERATIVE CHANGES: No significant degenerative changes. SOFT TISSUES: No acute abnormality. IMPRESSION: 1. Unremarkable CT of the lumbar spine Electronically signed by: Norman Gatlin MD 05/04/2024 02:22 PM EDT RP Workstation: [id number]   CT ABDOMEN PELVIS W CONTRAST Result Date: 04/30/2024 CLINICAL DATA:  Abdominal pain, acute, nonlocalized. Abdominal and hip pain. EXAM: CT ABDOMEN AND PELVIS WITH CONTRAST TECHNIQUE: Multidetector CT imaging of the abdomen and pelvis was performed using the standard protocol following bolus administration of intravenous contrast. RADIATION DOSE REDUCTION: This exam was performed according to the departmental dose-optimization program which includes automated exposure control, adjustment of the mA and/or kV according to patient size and/or use of iterative reconstruction technique. CONTRAST:  80mL OMNIPAQUE  IOHEXOL  300 MG/ML  SOLN COMPARISON:  04/24/2024.  FINDINGS: Lower chest: The distal tip of a right central venous catheter terminates in the right atrium. The heart is enlarged. Strandy atelectasis is noted at the lung bases. Hepatobiliary: The liver is enlarged with a lobular contour and multiple heterogeneous masses, similar in appearance to the prior exam. Stones are present within the gallbladder. The common bile duct is normal in caliber. Areas of mild biliary ductal dilatation are noted in the liver. Pancreas: Unremarkable. No pancreatic ductal dilatation or surrounding inflammatory changes. Spleen: Normal in size without focal abnormality. Adrenals/Urinary Tract: The adrenal glands are not well delineated on exam. The kidneys enhance symmetrically. Cysts are noted in the right kidney. No renal calculus or hydronephrosis bilaterally. The bladder is unremarkable. Stomach/Bowel: The stomach is within normal limits. No bowel obstruction, free air, or pneumatosis is seen. A moderate amount of retained stool is present in the colon. Appendix appears normal and contains air. There is fecalization of the small bowel, possible stasis. 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 The portal vein, IVC, splenic vein, and superior mesenteric veins are patent. The aorta is normal in caliber. A few prominent lymph nodes are noted in the gastrohepatic ligament, which may be reactive. Reproductive: Uterus and bilateral adnexa are unremarkable. Other: Small amount of free fluid is noted in the pelvis. Musculoskeletal: No acute osseous abnormality. IMPRESSION: 1. No acute intra-abdominal process. 2. Enlarged heterogeneous liver containing multiple masses, compatible with known hepatocellular carcinoma. 3. Cholelithiasis. 4. Small amount of free fluid in the pelvis. Electronically Signed   By: Leita Birmingham M.D.   On: 04/30/2024 18:56   MR Brain W and Wo Contrast Result Date: 04/24/2024 EXAM: MRI BRAIN WITH AND WITHOUT CONTRAST 04/24/2024 08:49:11 AM  TECHNIQUE: Multiplanar multisequence MRI of the head/brain was performed with and without the administration of intravenous contrast. COMPARISON: MRI of the head dated 02/12/2024. CLINICAL HISTORY: Metastatic disease evaluation. Prior 02/12/2024. 5mL Gadavist  given. FINDINGS: BRAIN AND VENTRICLES: No acute infarct. No acute intracranial hemorrhage. No mass effect or midline shift. No hydrocephalus. The sella is unremarkable. Normal flow voids. No mass or abnormal enhancement. ORBITS: No acute abnormality. SINUSES: No acute abnormality. BONES AND SOFT TISSUES: Normal bone marrow signal and enhancement. No acute soft tissue abnormality. IMPRESSION: 1. No acute intracranial abnormality. 2. No mass or abnormal enhancement. Electronically signed by: Evalene Coho MD 04/24/2024 09:00 AM EDT RP Workstation: [id number]   CT ABDOMEN PELVIS W CONTRAST Result Date: 04/24/2024 EXAM: CT ABDOMEN AND PELVIS WITH CONTRAST 04/24/2024 02:31:11 AM TECHNIQUE: CT of the abdomen and pelvis was performed with the administration of 100 mL of iohexol  (OMNIPAQUE ) 300 MG/ML solution. Multiplanar reformatted images are provided for review. Automated exposure control, iterative reconstruction, and/or weight-based adjustment of the mA/kV was utilized to  reduce the radiation dose to as low as reasonably achievable. COMPARISON: CT abdomen and pelvis 04/17/2024, CT abdomen and pelvis 09/18/2023. CLINICAL HISTORY: Bowel obstruction suspected. FINDINGS: LOWER CHEST: No acute abnormality. LIVER: Redemonstration of multiple heterogeneous masses of the liver consistent with known multifocal hepatocellular carcinoma with largest lesion measuring up to 16 cm (2: 29). Persistent mass effect on the inferior vena cava from the largest mass. GALLBLADDER AND BILE DUCTS: Gallbladder is unremarkable. No biliary ductal dilatation. SPLEEN: Splenorenal shunt again noted. PANCREAS: No acute abnormality. ADRENAL GLANDS: No acute abnormality. KIDNEYS, URETERS  AND BLADDER: No stones in the kidneys or ureters. No hydronephrosis. No perinephric or periureteral stranding. Urinary bladder is unremarkable. GI AND BOWEL: Stomach demonstrates no acute abnormality. No small or large bowel wall thickening. Appendix is unremarkable. There is no bowel obstruction. PERITONEUM AND RETROPERITONEUM: No ascites. No free air. VASCULATURE: Aorta is normal in caliber. Portal, splenic, superior mesenteric vein are patent. LYMPH NODES: No lymphadenopathy. REPRODUCTIVE ORGANS: Uterus is unremarkable. No adnexal masses. BONES AND SOFT TISSUES: No acute osseous abnormality. No focal soft tissue abnormality. IMPRESSION: 1. Redemonstration of multifocal hepatocellular carcinoma with largest lesion measuring up to 16 cm, causing persistent mass effect on the inferior vena cava. 2. No evidence of bowel obstruction. Electronically signed by: Morgane Naveau MD 04/24/2024 02:58 AM EDT RP Workstation: [id number]   CT ABDOMEN PELVIS W CONTRAST Result Date: 04/17/2024 CLINICAL DATA:  Epigastric pain EXAM: CT ABDOMEN AND PELVIS WITH CONTRAST TECHNIQUE: Multidetector CT imaging of the abdomen and pelvis was performed using the standard protocol following bolus administration of intravenous contrast. RADIATION DOSE REDUCTION: This exam was performed according to the departmental dose-optimization program which includes automated exposure control, adjustment of the mA and/or kV according to patient size and/or use of iterative reconstruction technique. CONTRAST:  OMNIPAQUE  IOHEXOL  300 MG/ML  SOLN COMPARISON:  03/06/2024 FINDINGS: Lower chest: No acute abnormality. Hepatobiliary: Liver again demonstrates multiple heterogeneous mass lesions similar to that seen on the most recent CT examination consistent with the given clinical history of multifocal hepatocellular carcinoma. The largest of these measures 16.4 cm in greatest dimension. This causes significant mass effect upon the hepatic hilar  vasculature as well as the IVC. These changes are stable in appearance. Gallbladder is partially distended with evidence of cholelithiasis. No complicating factors are seen. Pancreas: Unremarkable. No pancreatic ductal dilatation or surrounding inflammatory changes. Spleen: Normal in size without focal abnormality. Adrenals/Urinary Tract: Adrenal glands are within normal limits. Kidneys demonstrate a normal enhancement pattern. No renal calculi or obstructive changes are seen. Stable right renal cyst is noted. No follow-up is recommended. Bladder is partially distended. Stomach/Bowel: No obstructive or inflammatory changes of the colon are seen. The appendix is not well visualized. Small bowel and stomach are within normal limits. No inflammatory changes to suggest appendicitis are noted. Vascular/Lymphatic: Aortic atherosclerosis. No enlarged abdominal or pelvic lymph nodes. 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 Reproductive: Uterus and bilateral adnexa are unremarkable. Other: No free fluid is noted within the pelvis. Musculoskeletal: No acute or significant osseous findings. IMPRESSION: Changes consistent with the known history of multifocal hepatocellular carcinoma. Previously seen small bowel obstruction has resolved. Splenorenal shunt stable from the prior study. Cholelithiasis without complicating factors. Electronically Signed   By: Oneil Devonshire M.D.   On: 04/17/2024 00:29   DG Chest 2 View Result Date: 04/16/2024 CLINICAL DATA:  Shortness of breath. Receiving chemotherapy for liver cancer. EXAM: CHEST - 2 VIEW COMPARISON:  January 15, 2013 FINDINGS: A right-sided venous Port-A-Cath is seen with its distal tip noted at the junction of the superior vena cava and right atrium. The heart size and mediastinal contours are within normal limits. Low lung volumes are noted. Mild atelectasis is suspected within the bilateral lung bases. No acute infiltrate, pleural  effusion or pneumothorax is identified. The visualized skeletal structures are unremarkable. IMPRESSION: Low lung volumes with mild bibasilar atelectasis. Electronically Signed   By: Suzen Dials M.D.   On: 04/16/2024 21:05   CT Head Wo Contrast Result Date: 04/11/2024 CLINICAL DATA:  Brain metastases suspected Patient said she has a headache for 3 days. Unable to sleep. Has been vomiting and feels nauseous. Has liver and stomach cancer. EXAM: CT HEAD WITHOUT CONTRAST TECHNIQUE: Contiguous axial images were obtained from the base of the skull through the vertex without intravenous contrast. RADIATION DOSE REDUCTION: This exam was performed according to the departmental dose-optimization program which includes automated exposure control, adjustment of the mA and/or kV according to patient size and/or use of iterative reconstruction technique. COMPARISON:  MRI head 02/12/2024 FINDINGS: Brain: No evidence of large-territorial acute infarction. No parenchymal hemorrhage. No mass lesion. No extra-axial collection. No mass effect or midline shift. No hydrocephalus. Basilar cisterns are patent. Vascular: No hyperdense vessel. Atherosclerotic calcifications are present within the cavernous internal carotid arteries. Skull: No acute fracture or focal lesion. Sinuses/Orbits: Paranasal sinuses and mastoid air cells are clear. The orbits are unremarkable. Other: None. IMPRESSION: No acute intracranial abnormality. Please note MRI with and without contrast is much more sensitive for evaluation of brain metastases. Electronically Signed   By: Morgane  Naveau M.D.   On: 04/11/2024 19:28    PERFORMANCE STATUS (ECOG) : 2 - Symptomatic, <50% confined to bed  Review of Systems  Constitutional:  Positive for activity change, appetite change and fatigue.  Gastrointestinal:  Positive for abdominal pain.  Musculoskeletal:  Positive for back pain.  Unless otherwise noted, a complete review of systems is  negative.  Physical Exam General: NAD, thin  Cardiovascular: regular rate and rhythm Pulmonary: clear ant fields Abdomen: soft, nontender, + bowel sounds Extremities: no edema, no joint deformities Skin: no rashes Neurological: Alert and oriented x3  Discussed the use of AI scribe software for clinical note transcription with the patient, who gave verbal consent to proceed.  History of Present Illness Christina Gutierrez is a 48 year old female with hepatocellular cancer presents to clinic for initial outpatient palliative visit. Patient was recently seen by our team during recent hospitalization. Interpretor is present. She is accompanied by her husband.  I introduced myself, Maygan RN, and Palliative's role in collaboration with the oncology team. Concept of Palliative Care was introduced as specialized medical care for people and their families living with serious illness.  It focuses on providing relief from the symptoms and stress of a serious illness.  The goal is to improve quality of life for both the patient and the family. Values and goals of care important to patient and family were attempted to be elicited.  Patient lives in the home with her husband of more than 16 years. They have one child in the home. She is originally from [place] and have been in the [place]  for 20 years.   She has a decreased appetite, eating only one meal a day, typically lunch. She was prescribed Megace to help with her appetite but cannot afford it due to its high cost. No nausea or vomiting. She inquires about a more affordable  medication to stimulate her appetite. We will consider a short course of steroids while we obtain authorization for megace use. Patient is uninsured and due to financial constraints limitations of cost of medication. Maygan, RN contacted pharmacy and current megace prescription is over $500. We will adjust prescription allowing for use of Megace at a lower cost (40mg /ml).   We  have also confirmed that patient was approved and has Licensed Conveyancer assist with medications however funds are limited and one original prescription of megace would deplete funding more than 50%. Patient appreciative to know this support has been made available to her.   She mentions a past issue with constipation, which has since improved. No current problems with constipation or diabetes. Taking Miralax  1-2 times daily as needed.   We discussed her pain at length. Christina Gutierrez states she experiences significant sharp and stabbing back pain in the upper, middle, and lower back, severe enough to cause blurry vision and led her to miss a radiation appointment. She was recently hospitalized for this pain and received morphine , which provided relief. At home, hydrocodone  was ineffective, so she switched to morphine   by provider, which allowed her to sleep through the night and decrease her pain. Education provided on discontinuing use of hydrocodone  as it should not be use simultaneously with morphine  given efficacy. She verbalized understanding confirming morphine  provides better relief.    We will continue with MSIR 15mg  every 6 hours as needed for her uncontrolled cancer pain and continue close monitoring to support.   I discussed the importance of continued conversation with family and their medical providers regarding overall plan of care and treatment options, ensuring decisions are within the context of the patients values and GOCs.  Assessment & Plan  Established therapeutic relationship. Education provided on palliative's role in collaboration with their Oncology/Radiation team.  Liver cell carcinoma with neoplasm-related pain and anorexia Liver cell carcinoma with associated neoplasm-related pain and anorexia. Reports significant sharp, stabbing back pain in the middle and lower back, severe enough to cause blurred vision and missed appointments. Morphine  is effective for pain management,  while hydrocodone  was insufficient. Anorexia persists, with difficulty affording prescribed Megace for appetite stimulation. - Prescribe morphine  IR 15mg  every 6 hours as needed for pain management and ensure availability at the pharmacy. - Prescribe dexamethasone 4mg  titration for appetite stimulation and fatigue, to be taken as one tablet twice daily for five days, then one tablet daily. - Coordinate with the pharmacy to ensure prescriptions are filled. - Discuss with social worker about potential financial assistance for medication costs.  Palliative care management Emotional support is crucial, with reliance on faith for strength. Financial constraints are impacting access to medications and overall care. - Discuss potential financial assistance options with child psychotherapist. - Schedule follow-up appointment in two weeks to reassess pain management and overall condition.  Goals of Care (04/26/24)     -Patient with understanding that goal of treatment is to improve symptoms and quality of life.  She is invested in plan to continue with radiation therapy with palliative intent as previously planned. - She has stated that she would like for her husband Christina Gutierrez) to serve as her runner, broadcasting/film/video.  Will consider completion of HCPOA documentation.       -  Code Status: Full Code- did not further discuss today -Prognosis: Unable to determine   Patient expressed understanding and was in agreement with this plan. She also understands that She can call the clinic at any  time with any questions, concerns, or complaints.   Thank you for your referral and allowing Palliative to assist in Christina Gutierrez's care.   Number and complexity of problems addressed: HIGH - 1 or more chronic illnesses with SEVERE exacerbation, progression, or side effects of treatment - advanced cancer, pain. Any controlled substances utilized were prescribed in the context of palliative care.  Visit  consisted of counseling and education dealing with the complex and emotionally intense issues of symptom management and palliative care in the setting of serious and potentially life-threatening illness.  Signed by: Levon Borer, AGPCNP-BC Palliative Medicine Team/[facility]

## 2024-05-05 NOTE — Progress Notes (Signed)
 Diagnosis: Dehydration  Provider:  Praveen Mannam MD  Procedure: IV Infusion  IV Type: Port a Cath, IV Location: R Chest  Normal Saline, Dose: 1000 ml  Infusion Start Time: 1152  Infusion Stop Time: 1408  Post Infusion IV Care: Port a Cath Deaccessed/Flushed  Discharge: Condition: Good, Destination: Home . AVS Declined  Performed by:  Kimimila Tauzin, RN

## 2024-05-05 NOTE — Patient Instructions (Signed)
 VISIT SUMMARY:  Today, we discussed your back pain and decreased appetite. You mentioned that the pain is severe and affects your daily activities, including causing blurry vision and missed appointments. We also talked about your difficulty affording the prescribed medication for appetite stimulation.  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 We will continue with morphine  for pain management, as it has been effective for you. For your appetite, we will prescribe an alternative steroid medication that you will take as one tablet twice daily for five days, then one tablet daily. We will also coordinate with the pharmacy to ensure your prescriptions are filled and discuss potential financial assistance with a child psychotherapist.  -PALLIATIVE CARE MANAGEMENT: Palliative care focuses on providing relief from the symptoms and stress of a serious illness. Emotional support is important, and we will discuss potential financial assistance options with a social worker to help with medication costs. We will schedule a follow-up appointment in two weeks to reassess your pain management and overall condition.  INSTRUCTIONS:  Please take the prescribed medications as directed. We will coordinate with the pharmacy to ensure your prescriptions are filled. Additionally, we will discuss potential financial assistance options with a child psychotherapist. Please schedule a follow-up appointment in two weeks to reassess your pain management and overall condition.   Today you were prescribed 2 medications.  -Dexamethasone 4mg  twice a day for 5 days then decrease to 4mg  once a day.  -Continue Morphine  IR 15mg  every 6 hours as needed for severe pain -Stool Softener as needed for constipation -DO NOT TAKE HYDROCODONE . PLEASE DISCONTINUE USE WITH USE OF MORPHINE .

## 2024-05-06 ENCOUNTER — Other Ambulatory Visit: Payer: Self-pay

## 2024-05-06 ENCOUNTER — Ambulatory Visit: Payer: Self-pay

## 2024-05-06 ENCOUNTER — Inpatient Hospital Stay: Payer: Self-pay

## 2024-05-06 ENCOUNTER — Other Ambulatory Visit (HOSPITAL_COMMUNITY): Payer: Self-pay

## 2024-05-06 ENCOUNTER — Ambulatory Visit
Admission: RE | Admit: 2024-05-06 | Discharge: 2024-05-06 | Disposition: A | Discharge status: Home / Self Care | Payer: Self-pay | Source: Ambulatory Visit | Attending: Radiation Oncology | Admitting: Radiation Oncology

## 2024-05-06 LAB — RAD ONC ARIA SESSION SUMMARY
Course Elapsed Days: 9
Plan Fractions Treated to Date: 7
Plan Prescribed Dose Per Fraction: 3 Gy
Plan Total Fractions Prescribed: 7
Plan Total Prescribed Dose: 21 Gy
Reference Point Dosage Given to Date: 21 Gy
Reference Point Session Dosage Given: 3 Gy
Session Number: 7

## 2024-05-06 NOTE — Progress Notes (Signed)
 [facility] Clinical Social Work  Clinical Social Work was referred by medical provider for financial concerns.  Clinical Social Worker contacted patient by phone w/ interpreter to offer support and assess for needs.  Patient expressed need for resources for upcoming water bill. Patient still has remaining [employer] and uncertain of how to use it. Patient Services Navigator will assist. No Other needs.   Follow Up Plan:  Patient will contact CSW with any support or resource needs    Lizbeth Sprague, LCSW  Clinical Social Worker [facility]

## 2024-05-07 ENCOUNTER — Other Ambulatory Visit (HOSPITAL_COMMUNITY): Payer: Self-pay

## 2024-05-08 ENCOUNTER — Encounter (HOSPITAL_COMMUNITY): Payer: Self-pay | Admitting: Oncology

## 2024-05-08 ENCOUNTER — Other Ambulatory Visit (HOSPITAL_COMMUNITY): Payer: Self-pay

## 2024-05-08 ENCOUNTER — Encounter: Payer: Self-pay | Admitting: Oncology

## 2024-05-08 MED ORDER — MEGESTROL ACETATE 40 MG/ML PO SUSP
400.0000 mg | Freq: Two times a day (BID) | ORAL | 0 refills | Status: DC
  Filled 2024-05-08: qty 80, 4d supply, fill #0

## 2024-05-09 ENCOUNTER — Ambulatory Visit: Payer: Self-pay

## 2024-05-09 VITALS — BP 103/64 | HR 70 | Temp 97.6°F | Resp 18 | Ht 60.0 in | Wt 102.6 lb

## 2024-05-09 DIAGNOSIS — C220 Liver cell carcinoma: Secondary | ICD-10-CM

## 2024-05-09 DIAGNOSIS — E860 Dehydration: Secondary | ICD-10-CM

## 2024-05-09 MED ORDER — SODIUM CHLORIDE 0.9 % IV BOLUS
1000.0000 mL | Freq: Once | INTRAVENOUS | Status: AC
  Administered 2024-05-09: 1000 mL via INTRAVENOUS
  Filled 2024-05-09: qty 1000

## 2024-05-09 MED ORDER — ONDANSETRON HCL 4 MG/2ML IJ SOLN
8.0000 mg | Freq: Once | INTRAMUSCULAR | Status: AC
  Administered 2024-05-09: 8 mg via INTRAVENOUS
  Filled 2024-05-09: qty 4

## 2024-05-09 MED ORDER — HEPARIN SOD (PORK) LOCK FLUSH 100 UNIT/ML IV SOLN
500.0000 [IU] | Freq: Once | INTRAVENOUS | Status: AC | PRN
  Administered 2024-05-09: 500 [IU]
  Filled 2024-05-09: qty 5

## 2024-05-09 NOTE — Progress Notes (Signed)
 Diagnosis: Dehydration  Provider:  Praveen Mannam MD  Procedure: IV Infusion  IV Type: Port a Cath, IV Location: R Chest  Normal Saline, Dose: 1000 ml  Infusion Start Time: 1157  Infusion Stop Time: 1402  Post Infusion IV Care: Port a Cath Deaccessed/Flushed  Discharge: Condition: Good, Destination: Home . AVS Declined  Performed by:  Trequan Marsolek, RN

## 2024-05-10 ENCOUNTER — Encounter: Payer: Self-pay | Admitting: Oncology

## 2024-05-10 ENCOUNTER — Encounter (HOSPITAL_COMMUNITY): Payer: Self-pay | Admitting: Oncology

## 2024-05-11 ENCOUNTER — Other Ambulatory Visit (HOSPITAL_COMMUNITY): Payer: Self-pay

## 2024-05-11 ENCOUNTER — Other Ambulatory Visit: Payer: Self-pay

## 2024-05-11 ENCOUNTER — Inpatient Hospital Stay: Payer: Self-pay | Attending: Oncology | Admitting: Physician Assistant

## 2024-05-11 ENCOUNTER — Telehealth: Payer: Self-pay

## 2024-05-11 ENCOUNTER — Other Ambulatory Visit: Payer: Self-pay | Admitting: Nurse Practitioner

## 2024-05-11 VITALS — BP 124/74 | HR 67 | Temp 97.5°F | Resp 16 | Wt 107.5 lb

## 2024-05-11 DIAGNOSIS — Z79899 Other long term (current) drug therapy: Secondary | ICD-10-CM | POA: Insufficient documentation

## 2024-05-11 DIAGNOSIS — C220 Liver cell carcinoma: Secondary | ICD-10-CM

## 2024-05-11 DIAGNOSIS — Z7952 Long term (current) use of systemic steroids: Secondary | ICD-10-CM | POA: Insufficient documentation

## 2024-05-11 DIAGNOSIS — D649 Anemia, unspecified: Secondary | ICD-10-CM | POA: Insufficient documentation

## 2024-05-11 DIAGNOSIS — Z95828 Presence of other vascular implants and grafts: Secondary | ICD-10-CM

## 2024-05-11 DIAGNOSIS — Z79624 Long term (current) use of inhibitors of nucleotide synthesis: Secondary | ICD-10-CM | POA: Insufficient documentation

## 2024-05-11 LAB — CBC WITH DIFFERENTIAL (CANCER CENTER ONLY)
Abs Immature Granulocytes: 0.04 K/uL (ref 0.00–0.07)
Basophils Absolute: 0 K/uL (ref 0.0–0.1)
Basophils Relative: 0 %
Eosinophils Absolute: 0.1 K/uL (ref 0.0–0.5)
Eosinophils Relative: 2 %
HCT: 23.8 % — ABNORMAL LOW (ref 36.0–46.0)
Hemoglobin: 8.3 g/dL — ABNORMAL LOW (ref 12.0–15.0)
Immature Granulocytes: 1 %
Lymphocytes Relative: 3 %
Lymphs Abs: 0.2 K/uL — ABNORMAL LOW (ref 0.7–4.0)
MCH: 28.2 pg (ref 26.0–34.0)
MCHC: 34.9 g/dL (ref 30.0–36.0)
MCV: 81 fL (ref 80.0–100.0)
Monocytes Absolute: 0.6 K/uL (ref 0.1–1.0)
Monocytes Relative: 10 %
Neutro Abs: 4.9 K/uL (ref 1.7–7.7)
Neutrophils Relative %: 84 %
Platelet Count: 249 K/uL (ref 150–400)
RBC: 2.94 MIL/uL — ABNORMAL LOW (ref 3.87–5.11)
RDW: 32 % — ABNORMAL HIGH (ref 11.5–15.5)
WBC Count: 5.8 K/uL (ref 4.0–10.5)
nRBC: 0 % (ref 0.0–0.2)

## 2024-05-11 LAB — SAMPLE TO BLOOD BANK

## 2024-05-11 NOTE — Progress Notes (Signed)
 Symptom Management Consult Note [facility]    Patient Care Team: [name], MD as PCP - General (Oncology) Pickenpack-Cousar, Fannie SAILOR, NP as Nurse Practitioner (Hospice and Palliative Medicine) Silvano Valrie SQUIBB, RN as Registered Nurse    Name / MRN / DOB: Christina Gutierrez  [id number]  September 29, 1975   Date of visit: 05/11/2024   Chief Complaint/Reason for visit: port site infection    ASSESSMENT AND PLAN Patient is a 48 y.o. female with oncologic history of hepatocellular carcinoma followed by Dr. Autumn.  I have viewed most recent oncology note and lab work.  #Hepatocellular carcinoma  - Per oncology note Dr. Autumn on 04/08/24: Patient does not want to continue with atezolizumab  as she is afraid of worsening fatigue, failure to thrive. - Final radiation treatment was 05/06/24 for pain control with Dr. Dewey.   #Port-a-cath - Chart review shows port was placed 09/22/23 by IR. - PE today reveals Localized bruising around the port site for one week without pain, drainage, or infection. Bruising may be related to anemia. CBC showing hemoglobin >8 and platelets WNL. No indication for transfusion at this time. - Discussed with Dr. Autumn who agrees with keeping port for the time being incase patient decides to restart treatment. He will follow up with patient next week to reassess and for GOC.   Strict ED precautions discussed should symptoms worsen.   HEME/ONC HISTORY Oncology History  Hepatocellular carcinoma (HCC)  08/22/2023 Initial Diagnosis   Hepatocellular carcinoma (HCC)   08/27/2023 Cancer Staging   Staging form: Liver, AJCC 8th Edition - Clinical: Stage IIIA (cT3, cN0, cM0) - Signed by [name], MD on 08/27/2023   09/11/2023 -  Chemotherapy   Patient is on Treatment Plan : HEPATOCELLULAR Atezolizumab  + Bevacizumab  q21d         INTERVAL HISTORY  Discussed the use of AI scribe software for clinical note transcription with the  patient, who gave verbal consent to proceed.    Christina Gutierrez is a 48 y.o. female with oncologic history as above presenting to [facility] today with chief complaint of port site infection. Accompanied to clinic today by spouse who provides additional history. Mliss the translator present for entirety of visit.   Patient is concerned her port could be infected. Her port site began turning purple approximately one week ago, prior to it being accessed for fluids. There is no pain at the site. She avoids touching the area and reports no pain during showers or when drying off. She positions her seatbelt around her waist to avoid pressure on the port site. There has been no drainage from the incision, and no falls or injuries.  No fever, chills, or additional bruising elsewhere on her body. Her last blood work showed stable platelets but low hemoglobin. She had a blood transfusion approximately one month ago while hospitalized. She denies fatigue, shortness of breath or chest pain.    ROS  All other systems are reviewed and are negative for acute change except as noted in the HPI.    No Known Allergies   Past Medical History:  Diagnosis Date   Cancer (HCC)    Hepatocellular carcinoma (HCC) 08/22/2023   Metabolic acidosis 08/21/2023     Past Surgical History:  Procedure Laterality Date   CESAREAN SECTION     IR IMAGING GUIDED PORT INSERTION  09/22/2023    Social History   Socioeconomic History   Marital status: Married    Spouse name: Not on  file   Number of children: Not on file   Years of education: Not on file   Highest education level: Not on file  Occupational History   Not on file  Tobacco Use   Smoking status: Never   Smokeless tobacco: Never  Vaping Use   Vaping status: Never Used  Substance and Sexual Activity   Alcohol use: No   Drug use: No   Sexual activity: Not Currently  Other Topics Concern   Not on file  Social History Narrative   Not on file    Social Drivers of Health   Financial Resource Strain: Not on file  Food Insecurity: No Food Insecurity (04/24/2024)   Hunger Vital Sign    Worried About Running Out of Food in the Last Year: Never true    Ran Out of Food in the Last Year: Never true  Transportation Needs: No Transportation Needs (04/24/2024)   PRAPARE - Administrator, Civil Service (Medical): No    Lack of Transportation (Non-Medical): No  Physical Activity: Not on file  Stress: Not on file  Social Connections: Patient Declined (08/29/2023)   Social Connection and Isolation Panel    Frequency of Communication with Friends and Family: Patient declined    Frequency of Social Gatherings with Friends and Family: Patient declined    Attends Religious Services: Patient declined    Database Administrator or Organizations: Patient declined    Attends Banker Meetings: Patient declined    Marital Status: Patient declined  Intimate Partner Violence: Not At Risk (04/24/2024)   Humiliation, Afraid, Rape, and Kick questionnaire    Fear of Current or Ex-Partner: No    Emotionally Abused: No    Physically Abused: No    Sexually Abused: No    No family history on file.   Current Outpatient Medications:    ALPRAZolam  (XANAX ) 0.5 MG tablet, Take 1 tablet (0.5 mg total) by mouth at bedtime as needed for anxiety., Disp: 30 tablet, Rfl: 0   dexamethasone (DECADRON) 4 MG tablet, Take 1 tablet (4mg ) by mouth twice daily x 5 days, then take 1 tablet (4 mg) by mouth once a day x 5 days, Disp: 20 tablet, Rfl: 0   lidocaine  (LIDODERM ) 5 %, Place 1 patch onto the skin daily. Remove & Discard patch within 12 hours or as directed by MD, Disp: 30 patch, Rfl: 0   lidocaine -prilocaine  (EMLA ) cream, Apply topically as needed., Disp: 30 g, Rfl: 3   megestrol (MEGACE) 40 MG/ML suspension, Take 10 mLs (400 mg total) by mouth 2 (two) times daily., Disp: 480 mL, Rfl: 0   morphine  (MSIR) 15 MG tablet, Take 1 tablet (15 mg  total) by mouth every 6 (six) hours as needed for severe pain (pain score 7-10)., Disp: 45 tablet, Rfl: 0   ondansetron  (ZOFRAN ) 8 MG tablet, Take 1 tablet (8 mg total) by mouth every 8 (eight) hours as needed for nausea or vomiting., Disp: 60 tablet, Rfl: 0   pantoprazole  (PROTONIX ) 40 MG tablet, Take 1 tablet (40 mg total) by mouth daily at 6 (six) AM., Disp: 30 tablet, Rfl: 2   polyethylene glycol powder (GLYCOLAX /MIRALAX ) 17 GM/SCOOP powder, Take 17 g by mouth daily. Dissolve 1 capful (17g) in 4-8 ounces of liquid and take by mouth daily. (Patient taking differently: Take 17 g by mouth daily as needed for mild constipation.), Disp: 238 g, Rfl: 0   prochlorperazine  (COMPAZINE ) 10 MG tablet, Take 1 tablet (10 mg total) by  mouth every 6 (six) hours as needed for nausea or vomiting., Disp: 30 tablet, Rfl: 1   promethazine (PHENERGAN) 12.5 MG tablet, Take 1 tablet (12.5 mg total) by mouth every 6 (six) hours as needed for nausea or vomiting., Disp: 20 tablet, Rfl: 0   senna-docusate (SENOKOT-S) 8.6-50 MG tablet, Take 1 tablet by mouth 2 (two) times daily. (Patient taking differently: Take 1 tablet by mouth at bedtime as needed for mild constipation.), Disp: 60 tablet, Rfl: 0   tenofovir  alafenamide (VEMLIDY ) 25 MG tablet, Take 1 tablet (25 mg total) by mouth daily., Disp: 30 tablet, Rfl: 11  PHYSICAL EXAM ECOG FS:1 - Symptomatic but completely ambulatory    Vitals:   05/11/24 1138  BP: 124/74  Pulse: 67  Resp: 16  Temp: (!) 97.5 F (36.4 C)  TempSrc: Oral  SpO2: 100%  Weight: 107 lb 8 oz (48.8 kg)   Physical Exam Vitals and nursing note reviewed.  Constitutional:      Appearance: She is not ill-appearing or toxic-appearing.  HENT:     Head: Normocephalic.  Eyes:     Conjunctiva/sclera: Conjunctivae normal.  Cardiovascular:     Rate and Rhythm: Normal rate and regular rhythm.     Pulses: Normal pulses.  Pulmonary:     Effort: Pulmonary effort is normal.  Chest:     Comments: PAC  in right upper chest. Incision is well healed. Ecchymosis overlying port site. No tenderness to palpation. Please see media below. Abdominal:     General: There is no distension.  Musculoskeletal:     Cervical back: Normal range of motion.  Skin:    General: Skin is warm and dry.  Neurological:     Mental Status: She is alert.        LABORATORY DATA I have reviewed the data as listed    Latest Ref Rng & Units 05/11/2024   12:14 PM 05/04/2024   12:50 PM 04/30/2024    5:19 PM  CBC  WBC 4.0 - 10.5 K/uL 5.8  3.5  4.5   Hemoglobin 12.0 - 15.0 g/dL 8.3  8.0  8.5   Hematocrit 36.0 - 46.0 % 23.8  24.2  26.5   Platelets 150 - 400 K/uL 249  302  302         Latest Ref Rng & Units 05/04/2024   12:50 PM 04/30/2024    5:19 PM 04/29/2024    8:27 AM  CMP  Glucose 70 - 99 mg/dL 858  87  88   BUN 6 - 20 mg/dL 7  5  8    Creatinine 0.44 - 1.00 mg/dL 9.43  9.42  9.45   Sodium 135 - 145 mmol/L 133  134  134   Potassium 3.5 - 5.1 mmol/L 4.5  4.8  4.1   Chloride 98 - 111 mmol/L 97  98  102   CO2 22 - 32 mmol/L 22  25  24    Calcium 8.9 - 10.3 mg/dL 89.9  89.9  9.7   Total Protein 6.5 - 8.1 g/dL 8.5  9.3  8.5   Total Bilirubin 0.0 - 1.2 mg/dL 1.5  1.5  1.5   Alkaline Phos 38 - 126 U/L 113  130  112   AST 15 - 41 U/L 304  361  273   ALT 0 - 44 U/L 122  139  117        RADIOGRAPHIC STUDIES (from last 24 hours if applicable) I have personally reviewed the radiological images as listed  and agreed with the findings in the report. No results found.      Visit Diagnosis: 1. Port-A-Cath in place   2. Hepatocellular carcinoma (HCC)   3. Anemia, unspecified type      No orders of the defined types were placed in this encounter.   All questions were answered. The patient knows to call the clinic with any problems, questions or concerns. No barriers to learning was detected.  A total of more than 30 minutes were spent on this encounter with face-to-face time and non-face-to-face  time, including preparing to see the patient, ordering tests and/or medications, counseling the patient and coordination of care as outlined above.    Thank you for allowing me to participate in the care of this patient.    Jennae Hakeem E  Walisiewicz, PA-C Department of Hematology/Oncology [facility] at [facility] Phone: [phone]  [phone]    05/11/2024 3:22 PM

## 2024-05-11 NOTE — Radiation Completion Notes (Addendum)
  Radiation Oncology         [phone]) [phone] ________________________________  Name: Kelie Gainey MRN: [id number]  Date of Service: 05/06/2024  DOB: 27-Sep-1975  End of Treatment Note    Diagnosis: Bulky, multifocal hepatocellular carcinoma.   Intent: Palliative     ==========DELIVERED PLANS==========  First Treatment Date: 2024-04-27 Last Treatment Date: 2024-05-06   Plan Name: Abd_R Site: Abdomen Technique: 3D Mode: Photon Dose Per Fraction: 3 Gy Prescribed Dose (Delivered / Prescribed): 21 Gy / 21 Gy Prescribed Fxs (Delivered / Prescribed): 7 / 7     ==========ON TREATMENT VISIT DATES========== 2024-04-29    See weekly On Treatment Notes in Epic for details in the Media tab (listed as Progress notes on the On Treatment Visit Dates listed above). The patient tolerated radiation. She complained of low back pain without complaints of fatigue, nausea, or skin changes.  The patient will receive a call in about one month from the radiation oncology department. She will continue follow up with Dr. Autumn as well.      Donald KYM Husband, PAC

## 2024-05-11 NOTE — Telephone Encounter (Signed)
 Late Entry: On 05/10/2024 RN spoke with Mliss, spanish medical translator, concerning this pt. Pt had sent julie a picture of her port, it was bruised and red, swollen and appeared to have a small amount of white drainage in the picture. Pt reports it is painful, and swollen and has been for 3 days. When she went to another site for IV fluids the staff there recommended her to reach out to [facility] for evaluation. Pt reports no fevers or other symptoms concerning the port aside from pain and swelling. Dr.Pasam notified and recommended a [facility] appointment, appointment scheduled for today 05/11/2024 with Mallie Combes, PA.

## 2024-05-11 NOTE — Telephone Encounter (Signed)
 [employer] used.

## 2024-05-12 ENCOUNTER — Observation Stay (HOSPITAL_COMMUNITY)
Admission: EM | Admit: 2024-05-12 | Discharge: 2024-05-13 | Disposition: A | Discharge status: Home / Self Care | Payer: Self-pay | Attending: Internal Medicine | Admitting: Internal Medicine

## 2024-05-12 ENCOUNTER — Emergency Department (HOSPITAL_COMMUNITY): Payer: Self-pay

## 2024-05-12 ENCOUNTER — Encounter: Payer: Self-pay | Admitting: Oncology

## 2024-05-12 ENCOUNTER — Encounter (HOSPITAL_COMMUNITY): Payer: Self-pay | Admitting: Oncology

## 2024-05-12 ENCOUNTER — Other Ambulatory Visit: Payer: Self-pay

## 2024-05-12 ENCOUNTER — Encounter (HOSPITAL_COMMUNITY): Payer: Self-pay | Admitting: Internal Medicine

## 2024-05-12 ENCOUNTER — Ambulatory Visit: Payer: Self-pay

## 2024-05-12 DIAGNOSIS — G893 Neoplasm related pain (acute) (chronic): Principal | ICD-10-CM | POA: Insufficient documentation

## 2024-05-12 DIAGNOSIS — R110 Nausea: Secondary | ICD-10-CM

## 2024-05-12 DIAGNOSIS — D630 Anemia in neoplastic disease: Secondary | ICD-10-CM | POA: Insufficient documentation

## 2024-05-12 DIAGNOSIS — C220 Liver cell carcinoma: Principal | ICD-10-CM | POA: Insufficient documentation

## 2024-05-12 DIAGNOSIS — R52 Pain, unspecified: Secondary | ICD-10-CM | POA: Diagnosis present

## 2024-05-12 DIAGNOSIS — F5109 Other insomnia not due to a substance or known physiological condition: Secondary | ICD-10-CM

## 2024-05-12 LAB — CBC WITH DIFFERENTIAL/PLATELET
Abs Immature Granulocytes: 0.04 K/uL (ref 0.00–0.07)
Basophils Absolute: 0.1 K/uL (ref 0.0–0.1)
Basophils Relative: 1 %
Eosinophils Absolute: 0.5 K/uL (ref 0.0–0.5)
Eosinophils Relative: 9 %
HCT: 24.1 % — ABNORMAL LOW (ref 36.0–46.0)
Hemoglobin: 8.1 g/dL — ABNORMAL LOW (ref 12.0–15.0)
Immature Granulocytes: 1 %
Lymphocytes Relative: 4 %
Lymphs Abs: 0.2 K/uL — ABNORMAL LOW (ref 0.7–4.0)
MCH: 28.4 pg (ref 26.0–34.0)
MCHC: 33.6 g/dL (ref 30.0–36.0)
MCV: 84.6 fL (ref 80.0–100.0)
Monocytes Absolute: 0.6 K/uL (ref 0.1–1.0)
Monocytes Relative: 10 %
Neutro Abs: 4.4 K/uL (ref 1.7–7.7)
Neutrophils Relative %: 75 %
Platelets: 200 K/uL (ref 150–400)
RBC: 2.85 MIL/uL — ABNORMAL LOW (ref 3.87–5.11)
RDW: 31.8 % — ABNORMAL HIGH (ref 11.5–15.5)
Smear Review: NORMAL
WBC: 5.9 K/uL (ref 4.0–10.5)
nRBC: 0.3 % — ABNORMAL HIGH (ref 0.0–0.2)

## 2024-05-12 LAB — I-STAT CHEM 8, ED
BUN: 8 mg/dL (ref 6–20)
Calcium, Ion: 1.14 mmol/L — ABNORMAL LOW (ref 1.15–1.40)
Chloride: 106 mmol/L (ref 98–111)
Creatinine, Ser: 0.5 mg/dL (ref 0.44–1.00)
Glucose, Bld: 120 mg/dL — ABNORMAL HIGH (ref 70–99)
HCT: 26 % — ABNORMAL LOW (ref 36.0–46.0)
Hemoglobin: 8.8 g/dL — ABNORMAL LOW (ref 12.0–15.0)
Potassium: 3.8 mmol/L (ref 3.5–5.1)
Sodium: 138 mmol/L (ref 135–145)
TCO2: 21 mmol/L — ABNORMAL LOW (ref 22–32)

## 2024-05-12 LAB — COMPREHENSIVE METABOLIC PANEL WITH GFR
ALT: 194 U/L — ABNORMAL HIGH (ref 0–44)
AST: 411 U/L — ABNORMAL HIGH (ref 15–41)
Albumin: 3.7 g/dL (ref 3.5–5.0)
Alkaline Phosphatase: 116 U/L (ref 38–126)
Anion gap: 13 (ref 5–15)
BUN: 9 mg/dL (ref 6–20)
CO2: 20 mmol/L — ABNORMAL LOW (ref 22–32)
Calcium: 9 mg/dL (ref 8.9–10.3)
Chloride: 102 mmol/L (ref 98–111)
Creatinine, Ser: 0.52 mg/dL (ref 0.44–1.00)
GFR, Estimated: >60 mL/min (ref >60)
Glucose, Bld: 118 mg/dL — ABNORMAL HIGH (ref 70–99)
Potassium: 3.9 mmol/L (ref 3.5–5.1)
Sodium: 135 mmol/L (ref 135–145)
Total Bilirubin: 1 mg/dL (ref 0.0–1.2)
Total Protein: 7.7 g/dL (ref 6.5–8.1)

## 2024-05-12 LAB — URINALYSIS, ROUTINE W REFLEX MICROSCOPIC
Bilirubin Urine: NEGATIVE
Glucose, UA: NEGATIVE mg/dL
Hgb urine dipstick: NEGATIVE
Ketones, ur: NEGATIVE mg/dL
Leukocytes,Ua: NEGATIVE
Nitrite: NEGATIVE
Protein, ur: NEGATIVE mg/dL
Specific Gravity, Urine: 1.041 — ABNORMAL HIGH (ref 1.005–1.030)
pH: 7 (ref 5.0–8.0)

## 2024-05-12 LAB — LIPASE, BLOOD: Lipase: 28 U/L (ref 11–51)

## 2024-05-12 LAB — HCG, SERUM, QUALITATIVE: Preg, Serum: NEGATIVE

## 2024-05-12 MED ORDER — TRAZODONE HCL 50 MG PO TABS: 25.0000 mg | ORAL_TABLET | Freq: Every evening | ORAL | Status: DC | PRN

## 2024-05-12 MED ORDER — MEGESTROL ACETATE 400 MG/10ML PO SUSP
400.0000 mg | Freq: Two times a day (BID) | ORAL | Status: DC
  Administered 2024-05-12 – 2024-05-13 (×2): 400 mg via ORAL
  Filled 2024-05-12 (×2): qty 10

## 2024-05-12 MED ORDER — SENNOSIDES-DOCUSATE SODIUM 8.6-50 MG PO TABS
1.0000 | ORAL_TABLET | Freq: Two times a day (BID) | ORAL | Status: DC
  Administered 2024-05-12 – 2024-05-13 (×2): 1 via ORAL
  Filled 2024-05-12 (×2): qty 1

## 2024-05-12 MED ORDER — SODIUM CHLORIDE 0.9 % IV BOLUS
1000.0000 mL | Freq: Once | INTRAVENOUS | Status: DC
  Filled 2024-05-12: qty 1000

## 2024-05-12 MED ORDER — IOHEXOL 300 MG/ML  SOLN
80.0000 mL | Freq: Once | INTRAMUSCULAR | Status: AC | PRN
  Administered 2024-05-12: 80 mL via INTRAVENOUS

## 2024-05-12 MED ORDER — HYDROMORPHONE HCL 1 MG/ML IJ SOLN
0.5000 mg | Freq: Once | INTRAMUSCULAR | Status: AC
  Administered 2024-05-12: 0.5 mg via INTRAVENOUS
  Filled 2024-05-12: qty 1

## 2024-05-12 MED ORDER — ONDANSETRON HCL 4 MG/2ML IJ SOLN: 8.0000 mg | Freq: Once | INTRAMUSCULAR | Status: DC

## 2024-05-12 MED ORDER — MORPHINE SULFATE ER 30 MG PO TBCR: 30.0000 mg | EXTENDED_RELEASE_TABLET | Freq: Two times a day (BID) | ORAL | Status: DC

## 2024-05-12 MED ORDER — POLYETHYLENE GLYCOL 3350 17 G PO PACK: 17.0000 g | PACK | Freq: Every day | ORAL | Status: DC | PRN

## 2024-05-12 MED ORDER — ALPRAZOLAM 0.5 MG PO TABS: 0.5000 mg | ORAL_TABLET | Freq: Every evening | ORAL | Status: DC | PRN

## 2024-05-12 MED ORDER — ONDANSETRON HCL 4 MG PO TABS
4.0000 mg | ORAL_TABLET | Freq: Four times a day (QID) | ORAL | Status: DC | PRN
Start: 2024-05-12 — End: 2024-05-13
  Administered 2024-05-13: 4 mg via ORAL
  Filled 2024-05-12: qty 1

## 2024-05-12 MED ORDER — ENOXAPARIN SODIUM 40 MG/0.4ML IJ SOSY
40.0000 mg | PREFILLED_SYRINGE | INTRAMUSCULAR | Status: DC
  Administered 2024-05-12: 40 mg via SUBCUTANEOUS
  Filled 2024-05-12: qty 0.4

## 2024-05-12 MED ORDER — MORPHINE SULFATE ER 15 MG PO TBCR
15.0000 mg | EXTENDED_RELEASE_TABLET | Freq: Two times a day (BID) | ORAL | Status: DC
  Administered 2024-05-12 – 2024-05-13 (×2): 15 mg via ORAL
  Filled 2024-05-12 (×3): qty 1

## 2024-05-12 MED ORDER — LACTATED RINGERS IV BOLUS
1000.0000 mL | Freq: Once | INTRAVENOUS | Status: AC
  Administered 2024-05-12: 1000 mL via INTRAVENOUS

## 2024-05-12 MED ORDER — HYDROMORPHONE HCL 1 MG/ML IJ SOLN: 1.0000 mg | INTRAMUSCULAR | Status: DC | PRN

## 2024-05-12 MED ORDER — ACETAMINOPHEN 325 MG PO TABS: 650.0000 mg | ORAL_TABLET | Freq: Four times a day (QID) | ORAL | Status: DC | PRN

## 2024-05-12 MED ORDER — MORPHINE SULFATE 15 MG PO TABS
15.0000 mg | ORAL_TABLET | ORAL | Status: DC | PRN
  Administered 2024-05-12 – 2024-05-13 (×2): 15 mg via ORAL
  Filled 2024-05-12 (×2): qty 1

## 2024-05-12 MED ORDER — PANTOPRAZOLE SODIUM 40 MG PO TBEC
40.0000 mg | DELAYED_RELEASE_TABLET | Freq: Every day | ORAL | Status: DC
  Administered 2024-05-13: 40 mg via ORAL
  Filled 2024-05-12: qty 1

## 2024-05-12 MED ORDER — ONDANSETRON HCL 4 MG/2ML IJ SOLN: 4.0000 mg | Freq: Four times a day (QID) | INTRAMUSCULAR | Status: DC | PRN

## 2024-05-12 MED ORDER — ONDANSETRON HCL 4 MG/2ML IJ SOLN
4.0000 mg | Freq: Once | INTRAMUSCULAR | Status: AC
  Administered 2024-05-12: 4 mg via INTRAVENOUS
  Filled 2024-05-12: qty 2

## 2024-05-12 MED ORDER — ALBUTEROL SULFATE (2.5 MG/3ML) 0.083% IN NEBU
2.5000 mg | INHALATION_SOLUTION | RESPIRATORY_TRACT | Status: DC | PRN
Start: 2024-05-12 — End: 2024-05-13

## 2024-05-12 MED ORDER — SENNOSIDES-DOCUSATE SODIUM 8.6-50 MG PO TABS: 1.0000 | ORAL_TABLET | Freq: Every evening | ORAL | Status: DC | PRN

## 2024-05-12 MED ORDER — ACETAMINOPHEN 650 MG RE SUPP: 650.0000 mg | Freq: Four times a day (QID) | RECTAL | Status: DC | PRN

## 2024-05-12 NOTE — ED Triage Notes (Signed)
 C/o not being able to eat due to an abdominal/stomach 'ball'. Has liver and stomach cancer. Radiation completed Friday. Denies, N/V and diarrhea. Pt states when eating it goes down slowly and when it gets to her stomach becomes painful.

## 2024-05-12 NOTE — ED Provider Notes (Signed)
 [place] EMERGENCY DEPARTMENT AT [facility] Provider Note   CSN: [id number] Arrival date & time: 05/12/24  1251     Patient presents with: Abdominal Pain   Paisyn Guercio is a 48 y.o. female.    Abdominal Pain    Patient has a history of hepatocellular carcinoma, dehydration, anemia, hepatitis C who presents to the ED with complaints of abdominal pain.  Patient states since yesterday she has noticed increasing pain in her abdomen.  Whenever she tries to eat or drink something she feels like the pain increases and it does not go down properly.  Patient feels a ball in her upper abdomen.  Patient however is not having any episodes of vomiting.  She is not have any nausea no diarrhea.  Patient was seen at the cancer center yesterday but her symptoms started after that visit.  She has not had any fevers. Per notes patient stopped chemotherapy treatments on October 3.  Her last radiation treatment was on October 31 Prior to Admission medications   Medication Sig Start Date End Date Taking? Authorizing Provider  ALPRAZolam  (XANAX ) 0.5 MG tablet Take 1 tablet (0.5 mg total) by mouth at bedtime as needed for anxiety. 04/18/24   Ghimire, Kuber, MD  dexamethasone (DECADRON) 4 MG tablet Take 1 tablet (4mg ) by mouth twice daily x 5 days, then take 1 tablet (4 mg) by mouth once a day x 5 days 05/05/24   Pickenpack-Cousar, Fannie SAILOR, NP  lidocaine  (LIDODERM ) 5 % Place 1 patch onto the skin daily. Remove & Discard patch within 12 hours or as directed by MD 05/04/24   Zelaya, Oscar A, PA-C  lidocaine -prilocaine  (EMLA ) cream Apply topically as needed. 04/29/24   Pasam, Chinita, MD  megestrol (MEGACE) 40 MG/ML suspension Take 10 mLs (400 mg total) by mouth 2 (two) times daily. 05/08/24   Pickenpack-Cousar, Fannie SAILOR, NP  morphine  (MSIR) 15 MG tablet Take 1 tablet (15 mg total) by mouth every 6 (six) hours as needed for severe pain (pain score 7-10). 05/07/24   Pickenpack-Cousar, Athena  N, NP  ondansetron  (ZOFRAN ) 8 MG tablet Take 1 tablet (8 mg total) by mouth every 8 (eight) hours as needed for nausea or vomiting. 04/27/24   Lue Elsie BROCKS, MD  pantoprazole  (PROTONIX ) 40 MG tablet Take 1 tablet (40 mg total) by mouth daily at 6 (six) AM. 04/08/24   Pasam, Chinita, MD  polyethylene glycol powder (GLYCOLAX /MIRALAX ) 17 GM/SCOOP powder Take 17 g by mouth daily. Dissolve 1 capful (17g) in 4-8 ounces of liquid and take by mouth daily. Patient taking differently: Take 17 g by mouth daily as needed for mild constipation. 04/19/24   Raenelle Coria, MD  prochlorperazine  (COMPAZINE ) 10 MG tablet Take 1 tablet (10 mg total) by mouth every 6 (six) hours as needed for nausea or vomiting. 04/29/24   Pasam, Avinash, MD  promethazine (PHENERGAN) 12.5 MG tablet Take 1 tablet (12.5 mg total) by mouth every 6 (six) hours as needed for nausea or vomiting. 04/30/24   Garrick Charleston, MD  senna-docusate (SENOKOT-S) 8.6-50 MG tablet Take 1 tablet by mouth 2 (two) times daily. Patient taking differently: Take 1 tablet by mouth at bedtime as needed for mild constipation. 04/18/24   Ghimire, Kuber, MD  tenofovir  alafenamide (VEMLIDY ) 25 MG tablet Take 1 tablet (25 mg total) by mouth daily. 09/22/23   Fleeta Kathie Jomarie SAILOR, MD    Allergies: Patient has no known allergies.    Review of Systems  Gastrointestinal:  Positive for  abdominal pain.    Updated Vital Signs BP 117/66   Pulse 86   Temp 98.2 F (36.8 C)   Resp 17   SpO2 96%   Physical Exam Vitals and nursing note reviewed.  Constitutional:      Appearance: She is well-developed. She is ill-appearing.  HENT:     Head: Normocephalic and atraumatic.     Right Ear: External ear normal.     Left Ear: External ear normal.  Eyes:     General: No scleral icterus.       Right eye: No discharge.        Left eye: No discharge.     Conjunctiva/sclera: Conjunctivae normal.  Neck:     Trachea: No tracheal deviation.  Cardiovascular:      Rate and Rhythm: Normal rate and regular rhythm.  Pulmonary:     Effort: Pulmonary effort is normal. No respiratory distress.     Breath sounds: Normal breath sounds. No stridor. No wheezing or rales.  Abdominal:     General: Bowel sounds are normal. There is no distension.     Palpations: Abdomen is soft. There is hepatomegaly and mass.     Tenderness: There is abdominal tenderness in the right upper quadrant and epigastric area. There is no guarding or rebound.  Musculoskeletal:        General: No tenderness or deformity.     Cervical back: Neck supple.  Skin:    General: Skin is warm and dry.     Findings: No rash.  Neurological:     General: No focal deficit present.     Mental Status: She is alert.     Cranial Nerves: No cranial nerve deficit, dysarthria or facial asymmetry.     Sensory: No sensory deficit.     Motor: No abnormal muscle tone or seizure activity.     Coordination: Coordination normal.  Psychiatric:        Mood and Affect: Mood normal.     (all labs ordered are listed, but only abnormal results are displayed) Labs Reviewed  COMPREHENSIVE METABOLIC PANEL WITH GFR - Abnormal; Notable for the following components:      Result Value   CO2 20 (*)    Glucose, Bld 118 (*)    AST 411 (*)    ALT 194 (*)    All other components within normal limits  CBC WITH DIFFERENTIAL/PLATELET - Abnormal; Notable for the following components:   RBC 2.85 (*)    Hemoglobin 8.1 (*)    HCT 24.1 (*)    RDW 31.8 (*)    nRBC 0.3 (*)    Lymphs Abs 0.2 (*)    All other components within normal limits  URINALYSIS, ROUTINE W REFLEX MICROSCOPIC - Abnormal; Notable for the following components:   Specific Gravity, Urine 1.041 (*)    All other components within normal limits  I-STAT CHEM 8, ED - Abnormal; Notable for the following components:   Glucose, Bld 120 (*)    Calcium, Ion 1.14 (*)    TCO2 21 (*)    Hemoglobin 8.8 (*)    HCT 26.0 (*)    All other components within normal  limits  LIPASE, BLOOD  HCG, SERUM, QUALITATIVE    EKG: None  Radiology: CT ABDOMEN PELVIS W CONTRAST Result Date: 05/12/2024 CLINICAL DATA:  Unable to eat abdominal pain EXAM: CT ABDOMEN AND PELVIS WITH CONTRAST TECHNIQUE: Multidetector CT imaging of the abdomen and pelvis was performed using the standard protocol following bolus  administration of intravenous contrast. RADIATION DOSE REDUCTION: This exam was performed according to the departmental dose-optimization program which includes automated exposure control, adjustment of the mA and/or kV according to patient size and/or use of iterative reconstruction technique. CONTRAST:  80mL OMNIPAQUE  IOHEXOL  300 MG/ML  SOLN COMPARISON:  CT 04/30/2024, and multiple prior exams dating back to 08/21/2023 FINDINGS: Lower chest: Lung bases demonstrate no acute airspace disease. Central venous catheter tip visualized in the right atrium. Hepatobiliary: Liver enlargement with multiple hepatic masses including dominant right hepatic mass measuring about 15.5 x 18.4 x 15.7 cm, grossly similar in the short interval. Gallstones. Pancreas: Displaced to the left hemiabdomen. No inflammation or ductal dilatation. Spleen: Normal in size without focal abnormality. Adrenals/Urinary Tract: Right adrenal gland poorly visible. Left adrenal gland is normal. Cyst in the right kidney for which no imaging follow-up is recommended. No hydronephrosis. The bladder is unremarkable Stomach/Bowel: Stomach and duodenum displaced to the left with mass effect from large hepatic masses. No dilated small bowel. No acute bowel wall thickening. Vascular/Lymphatic: Nonaneurysmal aorta. Mass effect on the IVC, hepatic veins and portal vessels secondary to large liver masses but no direct evidence for a thrombus. Splenic vein is also patent. Numerous varices and collateral vessels are again noted. Reproductive: Uterus and bilateral adnexa are unremarkable. Other: No free air.  Small volume free fluid  in the pelvis Musculoskeletal: No acute osseous abnormality IMPRESSION: 1. No CT evidence for acute interval change compared to prior exam. 2. Hepatomegaly with multiple hepatic masses, grossly similar in the short interval corresponding to history of known malignancy. Mass effect on the IVC, hepatic veins and portal vessels but no visualized thrombus or occlusion. Mass effect on the stomach, duodenum, and pancreas which are displaced to the left hemiabdomen as before but no obstructive features. 3. Small volume free fluid in the pelvis. 4. Gallstones. Electronically Signed   By: Luke Bun M.D.   On: 05/12/2024 15:30     Procedures   Medications Ordered in the ED  HYDROmorphone  (DILAUDID ) injection 0.5 mg (has no administration in time range)  lactated ringers  bolus 1,000 mL (0 mLs Intravenous Stopped 05/12/24 1452)  HYDROmorphone  (DILAUDID ) injection 0.5 mg (0.5 mg Intravenous Given 05/12/24 1348)  ondansetron  (ZOFRAN ) injection 4 mg (4 mg Intravenous Given 05/12/24 1349)  iohexol  (OMNIPAQUE ) 300 MG/ML solution 80 mL (80 mLs Intravenous Contrast Given 05/12/24 1514)    Clinical Course as of 05/12/24 1657  Thu May 12, 2024  1417 CBC with Diff(!) CBC shows stable hemoglobin.  Metabolic panel shows increased liver enzymes [JK]  1604 CT scan does not show any acute abnormality.  No significant change compared to her recent scans.  Patient does have multiple hepatic masses that are causing some mass effect on the stomach duodenum and pancreas.  No signs of obstruction [JK]  1624 Reviewed findings with the patient.  Patient states she is unable to manage her symptoms at home.  I will consult the medical service for admission. [JK]    Clinical Course User Index [JK] Randol Simmonds, MD                                 Medical Decision Making Problems Addressed: Hepatocellular carcinoma [facility]): chronic illness or injury with exacerbation, progression, or side effects of treatment  Amount and/or  Complexity of Data Reviewed Labs: ordered. Decision-making details documented in ED Course. Radiology: ordered and independent interpretation performed.  Risk Prescription drug  management.   Presented to ED with complaints of increasing abdominal pain in the setting of her known malignancy.  Symptoms increasing with trying to eat and drink.  Patient without signs of acute blood loss.  No signs of severe dehydration.  I was concerned about the possibility of obstruction.   No acute findings noted on her CT.  Unfortunately I suspect her symptoms are related to the large size of her tumors causing mass effect.  Patient was given IV pain medications IV fluids.  Patient does not feel like she can manage her symptoms at home.  Have ordered additional pain medications.  Will consult with the medical service for admission.  Patient would benefit from further discussion with oncology and possibly palliative medicine     Final diagnoses:  Hepatocellular carcinoma [facility])    ED Discharge Orders     None          Randol Simmonds, MD 05/12/24 1659

## 2024-05-12 NOTE — H&P (Signed)
 History and Physical  Tenea Sens FMW:[id number] DOB: [birth year] DOA: 05/12/2024  PCP: [name], MD   Chief Complaint: Uncontrolled abdominal pain  HPI: Christina Gutierrez is a 48 y.o. female with medical history significant for hepatitis C, hepatocellular carcinoma who recently paused chemotherapy due to side effects and is being admitted to the hospital with intractable abdominal pain.  Patient is primarily Spanish-speaking, Spanish interpreter tablet was malfunctioning.  Her husband who is at the bedside speaks quite good English and was able to translate and assist in the history taking.  They tell me that she has essentially been pain-free until a couple of days ago, she has been followed in the palliative care clinic and was prescribed MS IR, Megace and Decadron recently.  However just in the last couple days she started having diffuse abdominal pain, feeling of early satiety and feeling like her food is not progressing down when she eats or drinks.  However, she has had no nausea or vomiting, and it has been a few days since she has had a bowel movement.  She denies fevers or chills, chest pain, or any other concerns.  Workup in the emergency department as detailed below was relatively benign, CT scan without any acute findings.  She has continued discomfort despite IV Dilaudid  and so hospitalist admission was requested.  Review of Systems: Please see HPI for pertinent positives and negatives. A complete 10 system review of systems are otherwise negative.  Past Medical History:  Diagnosis Date   Cancer [facility])    Hepatocellular carcinoma (HCC) 08/22/2023   Metabolic acidosis 08/21/2023   Past Surgical History:  Procedure Laterality Date   CESAREAN SECTION     IR IMAGING GUIDED PORT INSERTION  09/22/2023   Social History:  reports that she has never smoked. She has never used smokeless tobacco. She reports that she does not drink alcohol and does not use  drugs.  No Known Allergies  No family history on file.   Prior to Admission medications   Medication Sig Start Date End Date Taking? Authorizing Provider  ALPRAZolam  (XANAX ) 0.5 MG tablet Take 1 tablet (0.5 mg total) by mouth at bedtime as needed for anxiety. 04/18/24   Ghimire, Kuber, MD  dexamethasone (DECADRON) 4 MG tablet Take 1 tablet (4mg ) by mouth twice daily x 5 days, then take 1 tablet (4 mg) by mouth once a day x 5 days 05/05/24   Pickenpack-Cousar, Fannie SAILOR, NP  lidocaine  (LIDODERM ) 5 % Place 1 patch onto the skin daily. Remove & Discard patch within 12 hours or as directed by MD 05/04/24   Zelaya, Oscar A, PA-C  lidocaine -prilocaine  (EMLA ) cream Apply topically as needed. 04/29/24   Pasam, Millman, MD  megestrol (MEGACE) 40 MG/ML suspension Take 10 mLs (400 mg total) by mouth 2 (two) times daily. 05/08/24   Pickenpack-Cousar, Fannie SAILOR, NP  morphine  (MSIR) 15 MG tablet Take 1 tablet (15 mg total) by mouth every 6 (six) hours as needed for severe pain (pain score 7-10). 05/07/24   Pickenpack-Cousar, Athena N, NP  ondansetron  (ZOFRAN ) 8 MG tablet Take 1 tablet (8 mg total) by mouth every 8 (eight) hours as needed for nausea or vomiting. 04/27/24   Lue Elsie BROCKS, MD  pantoprazole  (PROTONIX ) 40 MG tablet Take 1 tablet (40 mg total) by mouth daily at 6 (six) AM. 04/08/24   Pasam, Avinash, MD  polyethylene glycol powder (GLYCOLAX /MIRALAX ) 17 GM/SCOOP powder Take 17 g by mouth daily. Dissolve 1 capful (17g) in 4-8 ounces of  liquid and take by mouth daily. Patient taking differently: Take 17 g by mouth daily as needed for mild constipation. 04/19/24   Raenelle Coria, MD  prochlorperazine  (COMPAZINE ) 10 MG tablet Take 1 tablet (10 mg total) by mouth every 6 (six) hours as needed for nausea or vomiting. 04/29/24   Pasam, Avinash, MD  promethazine (PHENERGAN) 12.5 MG tablet Take 1 tablet (12.5 mg total) by mouth every 6 (six) hours as needed for nausea or vomiting. 04/30/24   Garrick Charleston,  MD  senna-docusate (SENOKOT-S) 8.6-50 MG tablet Take 1 tablet by mouth 2 (two) times daily. Patient taking differently: Take 1 tablet by mouth at bedtime as needed for mild constipation. 04/18/24   Ghimire, Kuber, MD  tenofovir  alafenamide (VEMLIDY ) 25 MG tablet Take 1 tablet (25 mg total) by mouth daily. 09/22/23   Fleeta Kathie Jomarie LOISE, MD    Physical Exam: BP 117/66   Pulse 86   Temp 98.2 F (36.8 C)   Resp 17   SpO2 96%  General:  Alert, oriented, calm, in no acute distress, looks older than her stated age and is emaciated Cardiovascular: RRR, no murmurs or rubs, no peripheral edema  Respiratory: clear to auscultation bilaterally, no wheezes, no crackles  Abdomen: soft, nontender (just received IV Dilaudid ), distended with large palpable liver, normal bowel tones heard  Skin: dry, no rashes  Musculoskeletal: no joint effusions, normal range of motion  Psychiatric: appropriate affect, normal speech  Neurologic: extraocular muscles intact, clear speech, moving all extremities with intact sensorium         Labs on Admission:  Basic Metabolic Panel: Recent Labs  Lab 05/12/24 1342 05/12/24 1353  NA 135 138  K 3.9 3.8  CL 102 106  CO2 20*  --   GLUCOSE 118* 120*  BUN 9 8  CREATININE 0.52 0.50  CALCIUM 9.0  --    Liver Function Tests: Recent Labs  Lab 05/12/24 1342  AST 411*  ALT 194*  ALKPHOS 116  BILITOT 1.0  PROT 7.7  ALBUMIN 3.7   Recent Labs  Lab 05/12/24 1342  LIPASE 28   No results for input(s): AMMONIA in the last 168 hours. CBC: Recent Labs  Lab 05/11/24 1214 05/12/24 1342 05/12/24 1353  WBC 5.8 5.9  --   NEUTROABS 4.9 4.4  --   HGB 8.3* 8.1* 8.8*  HCT 23.8* 24.1* 26.0*  MCV 81.0 84.6  --   PLT 249 200  --    Cardiac Enzymes: No results for input(s): CKTOTAL, CKMB, CKMBINDEX, TROPONINI in the last 168 hours. BNP (last 3 results) No results for input(s): BNP in the last 8760 hours.  ProBNP (last 3 results) No results for  input(s): PROBNP in the last 8760 hours.  CBG: No results for input(s): GLUCAP in the last 168 hours.  Radiological Exams on Admission: CT ABDOMEN PELVIS W CONTRAST Result Date: 05/12/2024 CLINICAL DATA:  Unable to eat abdominal pain EXAM: CT ABDOMEN AND PELVIS WITH CONTRAST TECHNIQUE: Multidetector CT imaging of the abdomen and pelvis was performed using the standard protocol following bolus administration of intravenous contrast. RADIATION DOSE REDUCTION: This exam was performed according to the departmental dose-optimization program which includes automated exposure control, adjustment of the mA and/or kV according to patient size and/or use of iterative reconstruction technique. CONTRAST:  80mL OMNIPAQUE  IOHEXOL  300 MG/ML  SOLN COMPARISON:  CT 04/30/2024, and multiple prior exams dating back to 08/21/2023 FINDINGS: Lower chest: Lung bases demonstrate no acute airspace disease. Central venous catheter tip visualized in  the right atrium. Hepatobiliary: Liver enlargement with multiple hepatic masses including dominant right hepatic mass measuring about 15.5 x 18.4 x 15.7 cm, grossly similar in the short interval. Gallstones. Pancreas: Displaced to the left hemiabdomen. No inflammation or ductal dilatation. Spleen: Normal in size without focal abnormality. Adrenals/Urinary Tract: Right adrenal gland poorly visible. Left adrenal gland is normal. Cyst in the right kidney for which no imaging follow-up is recommended. No hydronephrosis. The bladder is unremarkable Stomach/Bowel: Stomach and duodenum displaced to the left with mass effect from large hepatic masses. No dilated small bowel. No acute bowel wall thickening. Vascular/Lymphatic: Nonaneurysmal aorta. Mass effect on the IVC, hepatic veins and portal vessels secondary to large liver masses but no direct evidence for a thrombus. Splenic vein is also patent. Numerous varices and collateral vessels are again noted. Reproductive: Uterus and bilateral  adnexa are unremarkable. Other: No free air.  Small volume free fluid in the pelvis Musculoskeletal: No acute osseous abnormality IMPRESSION: 1. No CT evidence for acute interval change compared to prior exam. 2. Hepatomegaly with multiple hepatic masses, grossly similar in the short interval corresponding to history of known malignancy. Mass effect on the IVC, hepatic veins and portal vessels but no visualized thrombus or occlusion. Mass effect on the stomach, duodenum, and pancreas which are displaced to the left hemiabdomen as before but no obstructive features. 3. Small volume free fluid in the pelvis. 4. Gallstones. Electronically Signed   By: Luke Bun M.D.   On: 05/12/2024 15:30   Assessment/Plan Christina Gutierrez is a 48 y.o. female with medical history significant for hepatitis C, hepatocellular carcinoma who recently paused chemotherapy due to side effects and is being admitted to the hospital with intractable abdominal pain.  Intractable abdominal pain-in the setting of large hepatocellular carcinoma, without acute findings seen on CT scan today.  Has not had a bowel movement in a few days, so constipation could be playing a role as well.  Mass effect from her hepatomegaly is also affecting her internal organs and undoubtedly causing some discomfort, though no obvious obstruction or other acute complication is evident. -Observation admission -Start bowel regimen -Start MS Contin , with continuation of MS IR as needed -Palliative care consult  Hepatocellular carcinoma-undergoing radiation therapy, recently discontinued chemotherapy due to side effects and is planning to follow-up with Dr. Autumn next week regarding other treatment options  Chronic anemia-anemia of chronic disease related to her malignancy, stable  DVT prophylaxis: Lovenox     Code Status: Full Code  Consults called: Her oncologist Dr. Pasam added to inpatient treatment team  Admission status:  Observation  Time spent: 49 minutes  Kerri Kovacik CHRISTELLA Gail MD [facility] Pager [phone]  If 7PM-7AM, please contact night-coverage [email or web address] Password [facility]  05/12/2024, 5:19 PM

## 2024-05-13 ENCOUNTER — Encounter (HOSPITAL_COMMUNITY): Payer: Self-pay | Admitting: Oncology

## 2024-05-13 ENCOUNTER — Encounter: Payer: Self-pay | Admitting: Oncology

## 2024-05-13 ENCOUNTER — Other Ambulatory Visit (HOSPITAL_COMMUNITY): Payer: Self-pay

## 2024-05-13 LAB — BASIC METABOLIC PANEL WITH GFR
Anion gap: 10 (ref 5–15)
BUN: 10 mg/dL (ref 6–20)
CO2: 22 mmol/L (ref 22–32)
Calcium: 9.3 mg/dL (ref 8.9–10.3)
Chloride: 102 mmol/L (ref 98–111)
Creatinine, Ser: 0.49 mg/dL (ref 0.44–1.00)
GFR, Estimated: >60 mL/min (ref >60)
Glucose, Bld: 79 mg/dL (ref 70–99)
Potassium: 4.4 mmol/L (ref 3.5–5.1)
Sodium: 134 mmol/L — ABNORMAL LOW (ref 135–145)

## 2024-05-13 LAB — CBC
HCT: 24.1 % — ABNORMAL LOW (ref 36.0–46.0)
Hemoglobin: 7.9 g/dL — ABNORMAL LOW (ref 12.0–15.0)
MCH: 28.1 pg (ref 26.0–34.0)
MCHC: 32.8 g/dL (ref 30.0–36.0)
MCV: 85.8 fL (ref 80.0–100.0)
Platelets: 193 K/uL (ref 150–400)
RBC: 2.81 MIL/uL — ABNORMAL LOW (ref 3.87–5.11)
RDW: 32.6 % — ABNORMAL HIGH (ref 11.5–15.5)
WBC: 5.3 K/uL (ref 4.0–10.5)
nRBC: 0 % (ref 0.0–0.2)

## 2024-05-13 MED ORDER — ALPRAZOLAM 0.5 MG PO TABS
0.5000 mg | ORAL_TABLET | Freq: Every day | ORAL | 0 refills | Status: DC | PRN
  Filled 2024-05-13: qty 30, 30d supply, fill #0

## 2024-05-13 MED ORDER — ONDANSETRON HCL 8 MG PO TABS
8.0000 mg | ORAL_TABLET | Freq: Three times a day (TID) | ORAL | 0 refills | Status: DC | PRN
  Filled 2024-05-13: qty 60, 20d supply, fill #0

## 2024-05-13 MED ORDER — MORPHINE SULFATE 15 MG PO TABS
15.0000 mg | ORAL_TABLET | ORAL | 0 refills | Status: DC | PRN
  Filled 2024-05-13: qty 60, 8d supply, fill #0

## 2024-05-13 MED ORDER — MORPHINE SULFATE ER 15 MG PO TBCR
15.0000 mg | EXTENDED_RELEASE_TABLET | Freq: Two times a day (BID) | ORAL | 0 refills | Status: DC
  Filled 2024-05-13: qty 60, 30d supply, fill #0

## 2024-05-13 MED ORDER — SENNOSIDES-DOCUSATE SODIUM 8.6-50 MG PO TABS
1.0000 | ORAL_TABLET | Freq: Two times a day (BID) | ORAL | 0 refills | Status: DC
  Filled 2024-05-13: qty 180, 90d supply, fill #0

## 2024-05-13 MED ORDER — LIDOCAINE-PRILOCAINE 2.5-2.5 % EX CREA
1.0000 | TOPICAL_CREAM | CUTANEOUS | 3 refills | Status: DC | PRN
  Filled 2024-05-13: qty 30, 7d supply, fill #0

## 2024-05-13 NOTE — Plan of Care (Signed)
   Problem: Education: Goal: Knowledge of General Education information will improve Description Including pain rating scale, medication(s)/side effects and non-pharmacologic comfort measures Outcome: Progressing

## 2024-05-13 NOTE — Discharge Summary (Signed)
 Physician Discharge Summary  Christina Gutierrez FMW:[id number] DOB: [birth year] DOA: 05/12/2024  PCP: [name], MD  Admit date: 05/12/2024 Discharge date: 05/13/2024  Admitted From: Home Disposition: Home  Recommendations for Outpatient Follow-up:  Follow up with PCP in 1-2 weeks Follow-up with medical oncology, Dr. Autumn Follow-up with palliative care Started on MS IR between long-acting MS Contin  for flare malignant pain Recommend further goals of care discussion outpatient given patient has declined any further chemotherapy given patient's advanced malignancy; would benefit from transition to hospice level of care  Home Health: No Equipment/Devices: None  Discharge Condition: Stable CODE STATUS: Full code Diet recommendation: Regular diet  History of present illness:  Christina Gutierrez is a 48 y.o. female with medical history significant for hepatitis C, hepatocellular carcinoma who recently paused chemotherapy due to side effects and is being admitted to the hospital with intractable abdominal pain.  Patient is primarily Spanish-speaking, Spanish interpreter tablet was malfunctioning.  Her husband who is at the bedside speaks quite good English and was able to translate and assist in the history taking.  They tell me that she has essentially been pain-free until a couple of days ago, she has been followed in the palliative care clinic and was prescribed MS IR, Megace and Decadron recently.  However just in the last couple days she started having diffuse abdominal pain, feeling of early satiety and feeling like her food is not progressing down when she eats or drinks.  However, she has had no nausea or vomiting, and it has been a few days since she has had a bowel movement.  She denies fevers or chills, chest pain, or any other concerns.  Workup in the emergency department as detailed below was relatively benign, CT scan without any acute findings.  She has continued  discomfort despite IV Dilaudid  and so hospitalist admission was requested.   Hospital course:  Intractable cancer-related pain.   Patient presenting with abdominal discomfort in the setting of large hepatocellular carcinoma.  CT abdomen/pelvis with no acute intra-abdominal/pelvic change since prior imaging but notable for hepatomegaly with multiple hepatic masses with mass effect on IVC with no seen thrombus or occlusion.  Patient has completed radiation therapy on May 06, 2024 and has further declined chemotherapy due to side effects.  Patient continued on her MS Contin  and added in MS IR as needed for further pain control with improvement of her symptoms.  Patient follows with palliative care outpatient.  Recommend close outpatient follow-up with both oncology and palliative care for further goals of care given that she has declined further chemotherapy with advanced disease and would benefit from transitioning to hospice level of care at this time.   Hepatocellular carcinoma Completed radiation therapy.  Recently discontinued chemotherapy due to side effects. Plan follow-up with Dr. Autumn next week regarding other treatment options.  If no other further treatment options recommend transitioning to hospice level of care.   Anemia of chronic disease related to her malignancy Stable  Discharge Diagnoses:  Principal Problem:   Intractable pain    Discharge Instructions  Discharge Instructions     Call MD for:  difficulty breathing, headache or visual disturbances   Complete by: As directed    Call MD for:  extreme fatigue   Complete by: As directed    Call MD for:  persistant dizziness or light-headedness   Complete by: As directed    Call MD for:  persistant nausea and vomiting   Complete by: As directed  Call MD for:  severe uncontrolled pain   Complete by: As directed    Call MD for:  temperature >100.4   Complete by: As directed    Diet - low sodium heart healthy    Complete by: As directed    Increase activity slowly   Complete by: As directed       Allergies as of 05/13/2024   No Known Allergies      Medication List     STOP taking these medications    dexamethasone 4 MG tablet Commonly known as: DECADRON   lidocaine  5 % Commonly known as: Lidoderm    morphine  15 MG tablet Commonly known as: MSIR Replaced by: morphine  15 MG 12 hr tablet       TAKE these medications    ALPRAZolam  0.5 MG tablet Commonly known as: XANAX  Take 1 tablet (0.5 mg total) by mouth daily as needed for anxiety.   lidocaine -prilocaine  cream Commonly known as: EMLA  Aplique segn sea necesario. (Apply topically as needed.) What changed: reasons to take this   megestrol 40 MG/ML suspension Commonly known as: MEGACE Tomar 10 ml (400 mg en total) por va oral 2 (dos) veces al da. (Take 10 mLs (400 mg total) by mouth 2 (two) times daily.)   morphine  15 MG 12 hr tablet Commonly known as: MS CONTIN  Take 1 tablet (15 mg total) by mouth every 12 (twelve) hours. Replaces: morphine  15 MG tablet   morphine  15 MG tablet Commonly known as: MSIR Take 1 tablet (15 mg total) by mouth every 3 (three) hours as needed for severe pain (pain score 7-10).   ondansetron  8 MG tablet Commonly known as: ZOFRAN  Take 1 tablet (8 mg total) by mouth every 8 (eight) hours as needed for nausea or vomiting.   pantoprazole  40 MG tablet Commonly known as: PROTONIX  Take 1 tablet (40 mg total) by mouth daily at 6 (six) AM.   polyethylene glycol powder 17 GM/SCOOP powder Commonly known as: GLYCOLAX /MIRALAX  Take 17 g by mouth daily. Dissolve 1 capful (17g) in 4-8 ounces of liquid and take by mouth daily. What changed:  when to take this reasons to take this additional instructions   prochlorperazine  10 MG tablet Commonly known as: COMPAZINE  Take 1 tablet (10 mg total) by mouth every 6 (six) hours as needed for nausea or vomiting.   promethazine 12.5 MG tablet Commonly  known as: PHENERGAN Take 1 tablet (12.5 mg total) by mouth every 6 (six) hours as needed for nausea or vomiting.   senna-docusate 8.6-50 MG tablet Commonly known as: Senokot-S Take 1 tablet by mouth 2 (two) times daily.   Vemlidy  25 MG tablet Generic drug: tenofovir  alafenamide Tome 1 tableta (25 mg en total) por va oral diariamente. (Take 1 tablet (25 mg total) by mouth daily.)        Follow-up Information     Pasam, Avinash, MD. Schedule an appointment as soon as possible for a visit in 1 week(s).   Specialty: Oncology Contact information: [address] [place] [address] [phone]         [place] Fannie SAILOR, NP. Schedule an appointment as soon as possible for a visit.   Specialty: Hospice and Palliative Medicine Contact information: [address] [place] [address] [phone]         [name], MD. Schedule an appointment as soon as possible for a visit.   Specialty: Oncology Contact information: [address] [place] [address] [phone]  No Known Allergies  Consultations: None   Procedures/Studies: CT ABDOMEN PELVIS W CONTRAST Result Date: 05/12/2024 CLINICAL DATA:  Unable to eat abdominal pain EXAM: CT ABDOMEN AND PELVIS WITH CONTRAST TECHNIQUE: Multidetector CT imaging of the abdomen and pelvis was performed using the standard protocol following bolus administration of intravenous contrast. RADIATION DOSE REDUCTION: This exam was performed according to the departmental dose-optimization program which includes automated exposure control, adjustment of the mA and/or kV according to patient size and/or use of iterative reconstruction technique. CONTRAST:  80mL OMNIPAQUE  IOHEXOL  300 MG/ML  SOLN COMPARISON:  CT 04/30/2024, and multiple prior exams dating back to 08/21/2023 FINDINGS: Lower chest: Lung bases demonstrate no acute airspace disease. Central venous catheter tip visualized in  the right atrium. Hepatobiliary: Liver enlargement with multiple hepatic masses including dominant right hepatic mass measuring about 15.5 x 18.4 x 15.7 cm, grossly similar in the short interval. Gallstones. Pancreas: Displaced to the left hemiabdomen. No inflammation or ductal dilatation. Spleen: Normal in size without focal abnormality. Adrenals/Urinary Tract: Right adrenal gland poorly visible. Left adrenal gland is normal. Cyst in the right kidney for which no imaging follow-up is recommended. No hydronephrosis. The bladder is unremarkable Stomach/Bowel: Stomach and duodenum displaced to the left with mass effect from large hepatic masses. No dilated small bowel. No acute bowel wall thickening. Vascular/Lymphatic: Nonaneurysmal aorta. Mass effect on the IVC, hepatic veins and portal vessels secondary to large liver masses but no direct evidence for a thrombus. Splenic vein is also patent. Numerous varices and collateral vessels are again noted. Reproductive: Uterus and bilateral adnexa are unremarkable. Other: No free air.  Small volume free fluid in the pelvis Musculoskeletal: No acute osseous abnormality IMPRESSION: 1. No CT evidence for acute interval change compared to prior exam. 2. Hepatomegaly with multiple hepatic masses, grossly similar in the short interval corresponding to history of known malignancy. Mass effect on the IVC, hepatic veins and portal vessels but no visualized thrombus or occlusion. Mass effect on the stomach, duodenum, and pancreas which are displaced to the left hemiabdomen as before but no obstructive features. 3. Small volume free fluid in the pelvis. 4. Gallstones. Electronically Signed   By: Luke Bun M.D.   On: 05/12/2024 15:30   CT Thoracic Spine Wo Contrast Result Date: 05/04/2024 EXAM: CT THORACIC SPINE WITHOUT CONTRAST 05/04/2024 01:07:19 PM TECHNIQUE: CT of the thoracic spine was performed without the administration of intravenous contrast. Multiplanar reformatted  images are provided for review. Automated exposure control, iterative reconstruction, and/or weight based adjustment of the mA/kV was utilized to reduce the radiation dose to as low as reasonably achievable. COMPARISON: CTA chest 01/18/2024. CLINICAL HISTORY: Back trauma, no prior imaging (Age >= 16y). FINDINGS: BONES AND ALIGNMENT: Normal vertebral body heights. Alignment is maintained. Partial fusion of the T3 and T4 vertebral bodies and posterior elements. No acute fracture or suspicious bone lesion. DEGENERATIVE CHANGES: There is no CT evidence of large disc herniation or high grade osseous spinal canal stenosis. SOFT TISSUES: Partially visualized right IJ approach central venous catheter. The paraspinal soft tissues are unremarkable. LIVER: Partially visualized masses within the right hepatic lobe better evaluated on the prior CT from July 2025. IMPRESSION: 1. No acute abnormality of the thoracic spine related to trauma. 2. Partial fusion of the T3 and T4 vertebral bodies and posterior elements. Electronically signed by: Donnice Mania MD 05/04/2024 02:23 PM EDT RP Workstation: [id number]   CT Lumbar Spine Wo Contrast Result Date: 05/04/2024 EXAM: CT of the Lumbar Spine Without Contrast 05/04/2024  01:07:19 PM TECHNIQUE: CT of the lumbar spine was performed without the administration of intravenous contrast. Multiplanar reformatted images are provided for review. Automated exposure control, iterative reconstruction, and/or weight based adjustment of the mA/kV was utilized to reduce the radiation dose to as low as reasonably achievable. COMPARISON: None available. CLINICAL HISTORY: Back trauma, no prior imaging (Age >= 16y). FINDINGS: BONES AND ALIGNMENT: Normal vertebral body heights. No acute fracture or suspicious bone lesion. Normal alignment. DEGENERATIVE CHANGES: No significant degenerative changes. SOFT TISSUES: No acute abnormality. IMPRESSION: 1. Unremarkable CT of the lumbar spine Electronically  signed by: Norman Gatlin MD 05/04/2024 02:22 PM EDT RP Workstation: [id number]   CT ABDOMEN PELVIS W CONTRAST Result Date: 04/30/2024 CLINICAL DATA:  Abdominal pain, acute, nonlocalized. Abdominal and hip pain. EXAM: CT ABDOMEN AND PELVIS WITH CONTRAST TECHNIQUE: Multidetector CT imaging of the abdomen and pelvis was performed using the standard protocol following bolus administration of intravenous contrast. RADIATION DOSE REDUCTION: This exam was performed according to the departmental dose-optimization program which includes automated exposure control, adjustment of the mA and/or kV according to patient size and/or use of iterative reconstruction technique. CONTRAST:  80mL OMNIPAQUE  IOHEXOL  300 MG/ML  SOLN COMPARISON:  04/24/2024. FINDINGS: Lower chest: The distal tip of a right central venous catheter terminates in the right atrium. The heart is enlarged. Strandy atelectasis is noted at the lung bases. Hepatobiliary: The liver is enlarged with a lobular contour and multiple heterogeneous masses, similar in appearance to the prior exam. Stones are present within the gallbladder. The common bile duct is normal in caliber. Areas of mild biliary ductal dilatation are noted in the liver. Pancreas: Unremarkable. No pancreatic ductal dilatation or surrounding inflammatory changes. Spleen: Normal in size without focal abnormality. Adrenals/Urinary Tract: The adrenal glands are not well delineated on exam. The kidneys enhance symmetrically. Cysts are noted in the right kidney. No renal calculus or hydronephrosis bilaterally. The bladder is unremarkable. Stomach/Bowel: The stomach is within normal limits. No bowel obstruction, free air, or pneumatosis is seen. A moderate amount of retained stool is present in the colon. Appendix appears normal and contains air. There is fecalization of the small bowel, possible stasis. 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 The portal vein, IVC, splenic  vein, and superior mesenteric veins are patent. The aorta is normal in caliber. A few prominent lymph nodes are noted in the gastrohepatic ligament, which may be reactive. Reproductive: Uterus and bilateral adnexa are unremarkable. Other: Small amount of free fluid is noted in the pelvis. Musculoskeletal: No acute osseous abnormality. IMPRESSION: 1. No acute intra-abdominal process. 2. Enlarged heterogeneous liver containing multiple masses, compatible with known hepatocellular carcinoma. 3. Cholelithiasis. 4. Small amount of free fluid in the pelvis. Electronically Signed   By: Leita Birmingham M.D.   On: 04/30/2024 18:56   MR Brain W and Wo Contrast Result Date: 04/24/2024 EXAM: MRI BRAIN WITH AND WITHOUT CONTRAST 04/24/2024 08:49:11 AM TECHNIQUE: Multiplanar multisequence MRI of the head/brain was performed with and without the administration of intravenous contrast. COMPARISON: MRI of the head dated 02/12/2024. CLINICAL HISTORY: Metastatic disease evaluation. Prior 02/12/2024. 5mL Gadavist  given. FINDINGS: BRAIN AND VENTRICLES: No acute infarct. No acute intracranial hemorrhage. No mass effect or midline shift. No hydrocephalus. The sella is unremarkable. Normal flow voids. No mass or abnormal enhancement. ORBITS: No acute abnormality. SINUSES: No acute abnormality. BONES AND SOFT TISSUES: Normal bone marrow signal and enhancement. No acute soft tissue abnormality. IMPRESSION: 1. No acute intracranial abnormality. 2. No mass or abnormal  enhancement. Electronically signed by: Evalene Coho MD 04/24/2024 09:00 AM EDT RP Workstation: [id number]   CT ABDOMEN PELVIS W CONTRAST Result Date: 04/24/2024 EXAM: CT ABDOMEN AND PELVIS WITH CONTRAST 04/24/2024 02:31:11 AM TECHNIQUE: CT of the abdomen and pelvis was performed with the administration of 100 mL of iohexol  (OMNIPAQUE ) 300 MG/ML solution. Multiplanar reformatted images are provided for review. Automated exposure control, iterative reconstruction, and/or  weight-based adjustment of the mA/kV was utilized to reduce the radiation dose to as low as reasonably achievable. COMPARISON: CT abdomen and pelvis 04/17/2024, CT abdomen and pelvis 09/18/2023. CLINICAL HISTORY: Bowel obstruction suspected. FINDINGS: LOWER CHEST: No acute abnormality. LIVER: Redemonstration of multiple heterogeneous masses of the liver consistent with known multifocal hepatocellular carcinoma with largest lesion measuring up to 16 cm (2: 29). Persistent mass effect on the inferior vena cava from the largest mass. GALLBLADDER AND BILE DUCTS: Gallbladder is unremarkable. No biliary ductal dilatation. SPLEEN: Splenorenal shunt again noted. PANCREAS: No acute abnormality. ADRENAL GLANDS: No acute abnormality. KIDNEYS, URETERS AND BLADDER: No stones in the kidneys or ureters. No hydronephrosis. No perinephric or periureteral stranding. Urinary bladder is unremarkable. GI AND BOWEL: Stomach demonstrates no acute abnormality. No small or large bowel wall thickening. Appendix is unremarkable. There is no bowel obstruction. PERITONEUM AND RETROPERITONEUM: No ascites. No free air. VASCULATURE: Aorta is normal in caliber. Portal, splenic, superior mesenteric vein are patent. LYMPH NODES: No lymphadenopathy. REPRODUCTIVE ORGANS: Uterus is unremarkable. No adnexal masses. BONES AND SOFT TISSUES: No acute osseous abnormality. No focal soft tissue abnormality. IMPRESSION: 1. Redemonstration of multifocal hepatocellular carcinoma with largest lesion measuring up to 16 cm, causing persistent mass effect on the inferior vena cava. 2. No evidence of bowel obstruction. Electronically signed by: Morgane Naveau MD 04/24/2024 02:58 AM EDT RP Workstation: [id number]   CT ABDOMEN PELVIS W CONTRAST Result Date: 04/17/2024 CLINICAL DATA:  Epigastric pain EXAM: CT ABDOMEN AND PELVIS WITH CONTRAST TECHNIQUE: Multidetector CT imaging of the abdomen and pelvis was performed using the standard protocol following bolus  administration of intravenous contrast. RADIATION DOSE REDUCTION: This exam was performed according to the departmental dose-optimization program which includes automated exposure control, adjustment of the mA and/or kV according to patient size and/or use of iterative reconstruction technique. CONTRAST:  OMNIPAQUE  IOHEXOL  300 MG/ML  SOLN COMPARISON:  03/06/2024 FINDINGS: Lower chest: No acute abnormality. Hepatobiliary: Liver again demonstrates multiple heterogeneous mass lesions similar to that seen on the most recent CT examination consistent with the given clinical history of multifocal hepatocellular carcinoma. The largest of these measures 16.4 cm in greatest dimension. This causes significant mass effect upon the hepatic hilar vasculature as well as the IVC. These changes are stable in appearance. Gallbladder is partially distended with evidence of cholelithiasis. No complicating factors are seen. Pancreas: Unremarkable. No pancreatic ductal dilatation or surrounding inflammatory changes. Spleen: Normal in size without focal abnormality. Adrenals/Urinary Tract: Adrenal glands are within normal limits. Kidneys demonstrate a normal enhancement pattern. No renal calculi or obstructive changes are seen. Stable right renal cyst is noted. No follow-up is recommended. Bladder is partially distended. Stomach/Bowel: No obstructive or inflammatory changes of the colon are seen. The appendix is not well visualized. Small bowel and stomach are within normal limits. No inflammatory changes to suggest appendicitis are noted. Vascular/Lymphatic: Aortic atherosclerosis. No enlarged abdominal or pelvic lymph nodes. 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 Reproductive: Uterus and bilateral adnexa are unremarkable. Other: No free fluid is noted within the pelvis. Musculoskeletal: No acute  or significant osseous findings. IMPRESSION: Changes consistent with the known history of  multifocal hepatocellular carcinoma. Previously seen small bowel obstruction has resolved. Splenorenal shunt stable from the prior study. Cholelithiasis without complicating factors. Electronically Signed   By: Oneil Devonshire M.D.   On: 04/17/2024 00:29   DG Chest 2 View Result Date: 04/16/2024 CLINICAL DATA:  Shortness of breath. Receiving chemotherapy for liver cancer. EXAM: CHEST - 2 VIEW COMPARISON:  January 15, 2013 FINDINGS: A right-sided venous Port-A-Cath is seen with its distal tip noted at the junction of the superior vena cava and right atrium. The heart size and mediastinal contours are within normal limits. Low lung volumes are noted. Mild atelectasis is suspected within the bilateral lung bases. No acute infiltrate, pleural effusion or pneumothorax is identified. The visualized skeletal structures are unremarkable. IMPRESSION: Low lung volumes with mild bibasilar atelectasis. Electronically Signed   By: Suzen Dials M.D.   On: 04/16/2024 21:05     Subjective: Patient seen examined bedside, lying in bed.  Family present at bedside.  Pain improved with addition of intermediate acting morphine .  Ready for discharge home.  No other questions or concerns at this time.  Discussed needs close follow-up with palliative care and oncology given recent stop of chemotherapy due to side effects.  If continues to stop and not wanting further treatment recommend transition to hospice level of care.  Patient denies headache, no visual changes, no chest pain, no palpitations, no shortness of breath, no fever/chills/night sweats, no nausea/vomit/diarrhea, no focal weakness, no fatigue, no paresthesias.  No acute events overnight per nursing staff.  Discharge Exam: Vitals:   05/13/24 0253 05/13/24 0615  BP: 103/63 109/73  Pulse: 70 74  Resp: 14 14  Temp: 97.7 F (36.5 C) 98.8 F (37.1 C)  SpO2: 96% 98%   Vitals:   05/12/24 2004 05/12/24 2252 05/13/24 0253 05/13/24 0615  BP:  108/66 103/63 109/73   Pulse:  75 70 74  Resp:  14 14 14   Temp:  98.9 F (37.2 C) 97.7 F (36.5 C) 98.8 F (37.1 C)  TempSrc:  Oral Oral Oral  SpO2:  96% 96% 98%  Weight: 47.7 kg     Height: 5' (1.524 m)       Physical Exam: GEN: NAD, alert and oriented x 3, chronically ill/thin appearance, appears older than stated age HEENT: NCAT, PERRL, EOMI, sclera clear, MMM PULM: CTAB w/o wheezes/crackles, normal respiratory effort on room air CV: RRR w/o M/G/R GI: abd soft, NTND, + BS MSK: no peripheral edema, moves all extremity dependently NEURO: No focal neurological deficit PSYCH: normal mood/affect Integumentary: No concerning rashes/lesions/wounds noted on exposed skin surfaces    The results of significant diagnostics from this hospitalization (including imaging, microbiology, ancillary and laboratory) are listed below for reference.     Microbiology: No results found for this or any previous visit (from the past 240 hours).   Labs: BNP (last 3 results) No results for input(s): BNP in the last 8760 hours. Basic Metabolic Panel: Recent Labs  Lab 05/12/24 1342 05/12/24 1353 05/13/24 0555  NA 135 138 134*  K 3.9 3.8 4.4  CL 102 106 102  CO2 20*  --  22  GLUCOSE 118* 120* 79  BUN 9 8 10   CREATININE 0.52 0.50 0.49  CALCIUM 9.0  --  9.3   Liver Function Tests: Recent Labs  Lab 05/12/24 1342  AST 411*  ALT 194*  ALKPHOS 116  BILITOT 1.0  PROT 7.7  ALBUMIN 3.7  Recent Labs  Lab 05/12/24 1342  LIPASE 28   No results for input(s): AMMONIA in the last 168 hours. CBC: Recent Labs  Lab 05/11/24 1214 05/12/24 1342 05/12/24 1353 05/13/24 0555  WBC 5.8 5.9  --  5.3  NEUTROABS 4.9 4.4  --   --   HGB 8.3* 8.1* 8.8* 7.9*  HCT 23.8* 24.1* 26.0* 24.1*  MCV 81.0 84.6  --  85.8  PLT 249 200  --  193   Cardiac Enzymes: No results for input(s): CKTOTAL, CKMB, CKMBINDEX, TROPONINI in the last 168 hours. BNP: Invalid input(s): POCBNP CBG: No results for input(s):  GLUCAP in the last 168 hours. D-Dimer No results for input(s): DDIMER in the last 72 hours. Hgb A1c No results for input(s): HGBA1C in the last 72 hours. Lipid Profile No results for input(s): CHOL, HDL, LDLCALC, TRIG, CHOLHDL, LDLDIRECT in the last 72 hours. Thyroid  function studies No results for input(s): TSH, T4TOTAL, T3FREE, THYROIDAB in the last 72 hours.  Invalid input(s): FREET3 Anemia work up No results for input(s): VITAMINB12, FOLATE, FERRITIN, TIBC, IRON, RETICCTPCT in the last 72 hours. Urinalysis    Component Value Date/Time   COLORURINE YELLOW 05/12/2024 1611   APPEARANCEUR CLEAR 05/12/2024 1611   LABSPEC 1.041 (H) 05/12/2024 1611   PHURINE 7.0 05/12/2024 1611   GLUCOSEU NEGATIVE 05/12/2024 1611   HGBUR NEGATIVE 05/12/2024 1611   BILIRUBINUR NEGATIVE 05/12/2024 1611   KETONESUR NEGATIVE 05/12/2024 1611   PROTEINUR NEGATIVE 05/12/2024 1611   NITRITE NEGATIVE 05/12/2024 1611   LEUKOCYTESUR NEGATIVE 05/12/2024 1611   Sepsis Labs Recent Labs  Lab 05/11/24 1214 05/12/24 1342 05/13/24 0555  WBC 5.8 5.9 5.3   Microbiology No results found for this or any previous visit (from the past 240 hours).   Time coordinating discharge: Over 30 minutes  SIGNED:   [name] PARAS Lisamarie Coke, DO  [facility] 05/13/2024, 11:48 AM

## 2024-05-13 NOTE — Progress Notes (Signed)
 Discharge medications delivered to patient at he bedside in a secure bag

## 2024-05-13 NOTE — Plan of Care (Signed)
  Daily Progress Note   Patient Name: Christina Gutierrez       Date: 05/13/2024 DOB: [birth year]  Age: 48 y.o. MRN#: [id number] Attending Physician: Austria, Eric J, DO Primary Care Physician: [name], MD Admit Date: 05/12/2024 Length of Stay: 0 days  Discussed care with primary hospitalist today.  Patient was admitted for pain in setting of hepatocellular carcinoma.  Discussed with hospitalist 2 noted pain is much better controlled today and planning on discharging today.  Discontinuing inpatient PMT consult.  Patient is established with outpatient PMT at [facility], please ensure follow-up.  Please reconsult if further needs arise.  Thank you.   Tinnie Radar, DO Palliative Care Provider PMT # [phone]  No Charge Note

## 2024-05-15 ENCOUNTER — Other Ambulatory Visit: Payer: Self-pay

## 2024-05-15 ENCOUNTER — Observation Stay (HOSPITAL_COMMUNITY)
Admission: EM | Admit: 2024-05-15 | Discharge: 2024-05-16 | Disposition: A | Discharge status: Home / Self Care | Payer: Self-pay | Attending: Internal Medicine | Admitting: Internal Medicine

## 2024-05-15 ENCOUNTER — Encounter (HOSPITAL_COMMUNITY): Payer: Self-pay | Admitting: *Deleted

## 2024-05-15 DIAGNOSIS — E43 Unspecified severe protein-calorie malnutrition: Secondary | ICD-10-CM | POA: Diagnosis present

## 2024-05-15 DIAGNOSIS — D638 Anemia in other chronic diseases classified elsewhere: Secondary | ICD-10-CM | POA: Diagnosis present

## 2024-05-15 DIAGNOSIS — R109 Unspecified abdominal pain: Principal | ICD-10-CM | POA: Diagnosis present

## 2024-05-15 DIAGNOSIS — D649 Anemia, unspecified: Secondary | ICD-10-CM | POA: Insufficient documentation

## 2024-05-15 DIAGNOSIS — M898X9 Other specified disorders of bone, unspecified site: Principal | ICD-10-CM | POA: Insufficient documentation

## 2024-05-15 DIAGNOSIS — C220 Liver cell carcinoma: Secondary | ICD-10-CM | POA: Diagnosis present

## 2024-05-15 DIAGNOSIS — G893 Neoplasm related pain (acute) (chronic): Principal | ICD-10-CM | POA: Diagnosis present

## 2024-05-15 DIAGNOSIS — Z8505 Personal history of malignant neoplasm of liver: Secondary | ICD-10-CM | POA: Insufficient documentation

## 2024-05-15 DIAGNOSIS — M549 Dorsalgia, unspecified: Secondary | ICD-10-CM | POA: Insufficient documentation

## 2024-05-15 LAB — CBC WITH DIFFERENTIAL/PLATELET
Abs Immature Granulocytes: 0.04 K/uL (ref 0.00–0.07)
Basophils Absolute: 0.1 K/uL (ref 0.0–0.1)
Basophils Relative: 1 %
Eosinophils Absolute: 0.4 K/uL (ref 0.0–0.5)
Eosinophils Relative: 7 %
HCT: 26 % — ABNORMAL LOW (ref 36.0–46.0)
Hemoglobin: 8.6 g/dL — ABNORMAL LOW (ref 12.0–15.0)
Immature Granulocytes: 1 %
Lymphocytes Relative: 6 %
Lymphs Abs: 0.4 K/uL — ABNORMAL LOW (ref 0.7–4.0)
MCH: 27.9 pg (ref 26.0–34.0)
MCHC: 33.1 g/dL (ref 30.0–36.0)
MCV: 84.4 fL (ref 80.0–100.0)
Monocytes Absolute: 0.5 K/uL (ref 0.1–1.0)
Monocytes Relative: 9 %
Neutro Abs: 4.6 K/uL (ref 1.7–7.7)
Neutrophils Relative %: 76 %
Platelets: 238 K/uL (ref 150–400)
RBC: 3.08 MIL/uL — ABNORMAL LOW (ref 3.87–5.11)
RDW: 32.4 % — ABNORMAL HIGH (ref 11.5–15.5)
WBC: 5.9 K/uL (ref 4.0–10.5)
nRBC: 0 % (ref 0.0–0.2)

## 2024-05-15 LAB — COMPREHENSIVE METABOLIC PANEL WITH GFR
ALT: 177 U/L — ABNORMAL HIGH (ref 0–44)
AST: 313 U/L — ABNORMAL HIGH (ref 15–41)
Albumin: 3.7 g/dL (ref 3.5–5.0)
Alkaline Phosphatase: 123 U/L (ref 38–126)
Anion gap: 16 — ABNORMAL HIGH (ref 5–15)
BUN: 8 mg/dL (ref 6–20)
CO2: 18 mmol/L — ABNORMAL LOW (ref 22–32)
Calcium: 9.7 mg/dL (ref 8.9–10.3)
Chloride: 97 mmol/L — ABNORMAL LOW (ref 98–111)
Creatinine, Ser: 0.66 mg/dL (ref 0.44–1.00)
GFR, Estimated: >60 mL/min (ref >60)
Glucose, Bld: 105 mg/dL — ABNORMAL HIGH (ref 70–99)
Potassium: 4.4 mmol/L (ref 3.5–5.1)
Sodium: 131 mmol/L — ABNORMAL LOW (ref 135–145)
Total Bilirubin: 1.7 mg/dL — ABNORMAL HIGH (ref 0.0–1.2)
Total Protein: 8.9 g/dL — ABNORMAL HIGH (ref 6.5–8.1)

## 2024-05-15 MED ORDER — MORPHINE SULFATE 15 MG PO TABS: 15.0000 mg | ORAL_TABLET | ORAL | Status: DC | PRN

## 2024-05-15 MED ORDER — HYDROMORPHONE HCL 1 MG/ML IJ SOLN
2.0000 mg | Freq: Once | INTRAMUSCULAR | Status: AC
  Administered 2024-05-15: 2 mg via INTRAVENOUS
  Filled 2024-05-15: qty 2

## 2024-05-15 MED ORDER — SENNOSIDES-DOCUSATE SODIUM 8.6-50 MG PO TABS
1.0000 | ORAL_TABLET | Freq: Two times a day (BID) | ORAL | Status: DC
  Administered 2024-05-15 – 2024-05-16 (×3): 1 via ORAL
  Filled 2024-05-15 (×3): qty 1

## 2024-05-15 MED ORDER — HYDROMORPHONE HCL 1 MG/ML IJ SOLN: 1.0000 mg | INTRAMUSCULAR | Status: DC | PRN

## 2024-05-15 MED ORDER — ENOXAPARIN SODIUM 40 MG/0.4ML IJ SOSY
40.0000 mg | PREFILLED_SYRINGE | INTRAMUSCULAR | Status: DC
  Administered 2024-05-15: 40 mg via SUBCUTANEOUS
  Filled 2024-05-15: qty 0.4

## 2024-05-15 MED ORDER — MORPHINE SULFATE ER 15 MG PO TBCR
15.0000 mg | EXTENDED_RELEASE_TABLET | Freq: Two times a day (BID) | ORAL | Status: DC
  Administered 2024-05-15 – 2024-05-16 (×2): 15 mg via ORAL
  Filled 2024-05-15 (×2): qty 1

## 2024-05-15 MED ORDER — POLYETHYLENE GLYCOL 3350 17 G PO PACK: 17.0000 g | PACK | Freq: Every day | ORAL | Status: DC | PRN

## 2024-05-15 MED ORDER — PROCHLORPERAZINE EDISYLATE 10 MG/2ML IJ SOLN
10.0000 mg | Freq: Four times a day (QID) | INTRAMUSCULAR | Status: DC | PRN
  Administered 2024-05-16: 10 mg via INTRAVENOUS
  Filled 2024-05-15: qty 2

## 2024-05-15 MED ORDER — ONDANSETRON HCL 4 MG/2ML IJ SOLN
4.0000 mg | Freq: Once | INTRAMUSCULAR | Status: AC
  Administered 2024-05-15: 4 mg via INTRAVENOUS
  Filled 2024-05-15: qty 2

## 2024-05-15 MED ORDER — HYDROMORPHONE HCL 1 MG/ML IJ SOLN: 2.0000 mg | INTRAMUSCULAR | Status: DC | PRN

## 2024-05-15 MED ORDER — TENOFOVIR ALAFENAMIDE FUMARATE 25 MG PO TABS: 25.0000 mg | ORAL_TABLET | Freq: Every day | ORAL | Status: DC

## 2024-05-15 MED ORDER — ACETAMINOPHEN 650 MG RE SUPP: 650.0000 mg | Freq: Four times a day (QID) | RECTAL | Status: DC | PRN

## 2024-05-15 MED ORDER — ACETAMINOPHEN 325 MG PO TABS: 650.0000 mg | ORAL_TABLET | Freq: Four times a day (QID) | ORAL | Status: DC | PRN

## 2024-05-15 NOTE — ED Triage Notes (Addendum)
 Ca pt: here for low back pain and constipation. Writhing and restless in pain. Speaks no English. Speaks Spanish. Family present. To b/r via w/c during triage, no results. Alert, moaning, interactive.

## 2024-05-15 NOTE — H&P (Signed)
 History and Physical    Christina Gutierrez FMW:[id number] DOB: 24-May-1976 DOA: 05/15/2024  PCP: [name], MD (Confirm with patient/family/NH records and if not entered, this has to be entered at [facility] point of entry) Patient coming from: Home  I have personally briefly reviewed patient's old medical records in [facility] Link  Chief Complaint: Abd pain  HPI: Christina Gutierrez is a 48 y.o. female with medical history significant of hepatitis C, hepatocellular carcinoma with poor tolerance to chemotherapy due to severe side effect presented with worsening of abdominal pain.  Patient speaks only Spanish with husband and son at bedside translating.  Patient was just hospitalized last week for similar symptoms, she was discharged on OxyContin  twice daily plus as needed liquid morphine  for breakthrough pain.  Overall, she felt the pain is poorly managed at home, she could not sleep and wakes up frequently for breakthrough pain despite using morphine  frequently.  She denied any nauseous vomiting diarrhea no fever or chills.   Review of Systems: As per HPI otherwise 14 point review of systems negative.    Past Medical History:  Diagnosis Date   Cancer [facility])    Hepatocellular carcinoma (HCC) 08/22/2023   Metabolic acidosis 08/21/2023    Past Surgical History:  Procedure Laterality Date   CESAREAN SECTION     IR IMAGING GUIDED PORT INSERTION  09/22/2023     reports that she has never smoked. She has never used smokeless tobacco. She reports that she does not drink alcohol and does not use drugs.  No Known Allergies  History reviewed. No pertinent family history.   Prior to Admission medications   Medication Sig Start Date End Date Taking? Authorizing Provider  lidocaine -prilocaine  (EMLA ) cream Apply topically as needed. 05/13/24  Yes Austria, Eric J, DO  morphine  (MS CONTIN ) 15 MG 12 hr tablet Take 1 tablet (15 mg total) by mouth every 12 (twelve) hours. 05/13/24   Yes [place], Eric J, DO  morphine  (MSIR) 15 MG tablet Take 1 tablet (15 mg total) by mouth every 3 (three) hours as needed for severe pain (pain score 7-10). 05/13/24  Yes Austria, Eric J, DO  ondansetron  (ZOFRAN ) 8 MG tablet Take 1 tablet (8 mg total) by mouth every 8 (eight) hours as needed for nausea or vomiting. 05/13/24  Yes [place], Eric J, DO  promethazine (PHENERGAN) 12.5 MG tablet Take 1 tablet (12.5 mg total) by mouth every 6 (six) hours as needed for nausea or vomiting. 04/30/24  Yes Garrick Charleston, MD  tenofovir  alafenamide (VEMLIDY ) 25 MG tablet Take 1 tablet (25 mg total) by mouth daily. 09/22/23  Yes Fleeta Rothman, Jomarie SAILOR, MD  ALPRAZolam  (XANAX ) 0.5 MG tablet Take 1 tablet (0.5 mg total) by mouth daily as needed for anxiety. Patient not taking: Reported on 05/15/2024 05/13/24   [place], [name] PARAS, DO  megestrol (MEGACE) 40 MG/ML suspension Take 10 mLs (400 mg total) by mouth 2 (two) times daily. Patient not taking: Reported on 05/15/2024 05/08/24   Pickenpack-Cousar, Athena N, NP  pantoprazole  (PROTONIX ) 40 MG tablet Take 1 tablet (40 mg total) by mouth daily at 6 (six) AM. Patient not taking: Reported on 05/15/2024 04/08/24   Pasam, Avinash, MD  polyethylene glycol powder (GLYCOLAX /MIRALAX ) 17 GM/SCOOP powder Take 17 g by mouth daily. Dissolve 1 capful (17g) in 4-8 ounces of liquid and take by mouth daily. Patient not taking: Reported on 05/15/2024 04/19/24   Raenelle Coria, MD  prochlorperazine  (COMPAZINE ) 10 MG tablet Take 1 tablet (10 mg total) by  mouth every 6 (six) hours as needed for nausea or vomiting. 04/29/24   Pasam, Chinita, MD  senna-docusate (SENOKOT-S) 8.6-50 MG tablet Take 1 tablet by mouth 2 (two) times daily. Patient not taking: Reported on 05/15/2024 05/13/24 08/11/24  Aust[place], Eric J, DO    Physical Exam: Vitals:   05/15/24 1228 05/15/24 1241  BP: (!) 100/56   Pulse: 88   Resp: 18   Temp: 98.4 F (36.9 C)   TempSrc: Oral   SpO2: 100%   Weight:  47.6 kg     Constitutional: NAD, calm, comfortable Vitals:   05/15/24 1228 05/15/24 1241  BP: (!) 100/56   Pulse: 88   Resp: 18   Temp: 98.4 F (36.9 C)   TempSrc: Oral   SpO2: 100%   Weight:  47.6 kg   Eyes: PERRL, lids and conjunctivae normal ENMT: Mucous membranes are moist. Posterior pharynx clear of any exudate or lesions.Normal dentition.  Neck: normal, supple, no masses, no thyromegaly Respiratory: clear to auscultation bilaterally, no wheezing, no crackles. Normal respiratory effort. No accessory muscle use.  Cardiovascular: Regular rate and rhythm, no murmurs / rubs / gallops. No extremity edema. 2+ pedal pulses. No carotid bruits.  Abdomen: no tenderness, no masses palpated. No hepatosplenomegaly. Bowel sounds positive.  Musculoskeletal: no clubbing / cyanosis. No joint deformity upper and lower extremities. Good ROM, no contractures. Normal muscle tone.  Skin: no rashes, lesions, ulcers. No induration Neurologic: CN 2-12 grossly intact. Sensation intact, DTR normal. Strength 5/5 in all 4.  Psychiatric: Normal judgment and insight. Alert and oriented x 3. Normal mood.     Labs on Admission: I have personally reviewed following labs and imaging studies  CBC: Recent Labs  Lab 05/11/24 1214 05/12/24 1342 05/12/24 1353 05/13/24 0555 05/15/24 1323  WBC 5.8 5.9  --  5.3 5.9  NEUTROABS 4.9 4.4  --   --  4.6  HGB 8.3* 8.1* 8.8* 7.9* 8.6*  HCT 23.8* 24.1* 26.0* 24.1* 26.0*  MCV 81.0 84.6  --  85.8 84.4  PLT 249 200  --  193 238   Basic Metabolic Panel: Recent Labs  Lab 05/12/24 1342 05/12/24 1353 05/13/24 0555 05/15/24 1323  NA 135 138 134* 131*  K 3.9 3.8 4.4 4.4  CL 102 106 102 97*  CO2 20*  --  22 18*  GLUCOSE 118* 120* 79 105*  BUN 9 8 10 8   CREATININE 0.52 0.50 0.49 0.66  CALCIUM 9.0  --  9.3 9.7   GFR: Estimated Creatinine Clearance: 63.1 mL/min (by C-G formula based on SCr of 0.66 mg/dL). Liver Function Tests: Recent Labs  Lab 05/12/24 1342  05/15/24 1323  AST 411* 313*  ALT 194* 177*  ALKPHOS 116 123  BILITOT 1.0 1.7*  PROT 7.7 8.9*  ALBUMIN 3.7 3.7   Recent Labs  Lab 05/12/24 1342  LIPASE 28   No results for input(s): AMMONIA in the last 168 hours. Coagulation Profile: No results for input(s): INR, PROTIME in the last 168 hours. Cardiac Enzymes: No results for input(s): CKTOTAL, CKMB, CKMBINDEX, TROPONINI in the last 168 hours. BNP (last 3 results) No results for input(s): PROBNP in the last 8760 hours. HbA1C: No results for input(s): HGBA1C in the last 72 hours. CBG: No results for input(s): GLUCAP in the last 168 hours. Lipid Profile: No results for input(s): CHOL, HDL, LDLCALC, TRIG, CHOLHDL, LDLDIRECT in the last 72 hours. Thyroid  Function Tests: No results for input(s): TSH, T4TOTAL, FREET4, T3FREE, THYROIDAB in the last 72 hours. Anemia  Panel: No results for input(s): VITAMINB12, FOLATE, FERRITIN, TIBC, IRON, RETICCTPCT in the last 72 hours. Urine analysis:    Component Value Date/Time   COLORURINE YELLOW 05/12/2024 1611   APPEARANCEUR CLEAR 05/12/2024 1611   LABSPEC 1.041 (H) 05/12/2024 1611   PHURINE 7.0 05/12/2024 1611   GLUCOSEU NEGATIVE 05/12/2024 1611   HGBUR NEGATIVE 05/12/2024 1611   BILIRUBINUR NEGATIVE 05/12/2024 1611   KETONESUR NEGATIVE 05/12/2024 1611   PROTEINUR NEGATIVE 05/12/2024 1611   NITRITE NEGATIVE 05/12/2024 1611   LEUKOCYTESUR NEGATIVE 05/12/2024 1611    Radiological Exams on Admission: No results found.  EKG: Independently reviewed.  Sinus rhythm, no acute ST changes.  Assessment/Plan Principal Problem:   Intractable abdominal pain Active Problems:   Hepatocellular carcinoma (HCC)   Abdominal pain  (please populate well all problems here in Problem List. (For example, if patient is on BP meds at home and you resume or decide to hold them, it is a problem that needs to be her. Same for CAD, COPD, HLD and so  on)  Intractable abdominal pain, secondary to hepatocellular carcinoma - Failed outpatient management - Add as needed Dilaudid  for breakthrough pains, continue OxyContin  twice daily plus as needed liquid morphine . - Review of patient discharge record, hospice team was consulted on last admission however patient appears to be somewhat unsure of exactly what to expect from home hospice.  She has not received any phone call from hospice team or either does she have any phone number to call them.  With a history of hepatocellular carcinoma along with a poor record of tolerance to chemo, expect the patient to have less than 6 months to live, will consult Perative care team for home hospice set up. - Communicated with palliative care team, who recommended I consult case management to set up home hospice.  Consult placed.  Severe protein calorie malnutrition Cachexia - Continue protein supplements  Chronic anemia secondary to chronic illness - No bleeding, H&H stable  DVT prophylaxis: Lovenox Code Status: Full code Family Communication: Husband and son at bedside Disposition Plan: Expect less than 2 midnight hospital stay Consults called: Palliative care Admission status: MedSurg observation   Cort ONEIDA Mana MD [facility] Pager [phone]  05/15/2024, 3:28 PM

## 2024-05-15 NOTE — Plan of Care (Signed)

## 2024-05-15 NOTE — ED Notes (Signed)
 Pt said port is bruised and MD said not to access it so got IV access

## 2024-05-15 NOTE — ED Provider Notes (Signed)
 [place] EMERGENCY DEPARTMENT AT [facility] Provider Note   CSN: [id number] Arrival date & time: 05/15/24  1222     Patient presents with: Back Pain   Christina Gutierrez is a 48 y.o. female.   This is a 48 year old female with a history of hepatocellular carcinoma who is here today for pain.  Patient was recently discharged on the seventh for similar symptoms.  She is not interested in pursuing additional treatment.   Back Pain      Prior to Admission medications   Medication Sig Start Date End Date Taking? Authorizing Provider  ALPRAZolam  (XANAX ) 0.5 MG tablet Take 1 tablet (0.5 mg total) by mouth daily as needed for anxiety. 05/13/24   [place], [name] PARAS, DO  lidocaine -prilocaine  (EMLA ) cream Apply topically as needed. 05/13/24   [place], [name] PARAS, DO  megestrol (MEGACE) 40 MG/ML suspension Take 10 mLs (400 mg total) by mouth 2 (two) times daily. 05/08/24   Pickenpack-Cousar, Athena N, NP  morphine  (MS CONTIN ) 15 MG 12 hr tablet Take 1 tablet (15 mg total) by mouth every 12 (twelve) hours. 05/13/24   [place], Eric J, DO  morphine  (MSIR) 15 MG tablet Take 1 tablet (15 mg total) by mouth every 3 (three) hours as needed for severe pain (pain score 7-10). 05/13/24   [place], Eric J, DO  ondansetron  (ZOFRAN ) 8 MG tablet Take 1 tablet (8 mg total) by mouth every 8 (eight) hours as needed for nausea or vomiting. 05/13/24   [place], [name] PARAS, DO  pantoprazole  (PROTONIX ) 40 MG tablet Take 1 tablet (40 mg total) by mouth daily at 6 (six) AM. 04/08/24   Pasam, Avinash, MD  polyethylene glycol powder (GLYCOLAX /MIRALAX ) 17 GM/SCOOP powder Take 17 g by mouth daily. Dissolve 1 capful (17g) in 4-8 ounces of liquid and take by mouth daily. Patient taking differently: Take 17 g by mouth daily as needed for mild constipation. 04/19/24   Raenelle Coria, MD  prochlorperazine  (COMPAZINE ) 10 MG tablet Take 1 tablet (10 mg total) by mouth every 6 (six) hours as needed for nausea or vomiting.  04/29/24   Pasam, Avinash, MD  promethazine (PHENERGAN) 12.5 MG tablet Take 1 tablet (12.5 mg total) by mouth every 6 (six) hours as needed for nausea or vomiting. 04/30/24   Garrick Charleston, MD  senna-docusate (SENOKOT-S) 8.6-50 MG tablet Take 1 tablet by mouth 2 (two) times daily. 05/13/24 08/11/24  [place], Eric J, DO  tenofovir  alafenamide (VEMLIDY ) 25 MG tablet Take 1 tablet (25 mg total) by mouth daily. 09/22/23   Fleeta Kathie Jomarie LOISE, MD    Allergies: Patient has no known allergies.    Review of Systems  Musculoskeletal:  Positive for back pain.    Updated Vital Signs BP (!) 100/56 (BP Location: Left Arm)   Pulse 88   Temp 98.4 F (36.9 C) (Oral)   Resp 18   Wt 47.6 kg   SpO2 100%   BMI 20.51 kg/m   Physical Exam Vitals and nursing note reviewed.  Constitutional:      Appearance: She is not toxic-appearing.     Comments: Cachectic  Eyes:     Pupils: Pupils are equal, round, and reactive to light.  Cardiovascular:     Rate and Rhythm: Normal rate.  Abdominal:     General: Abdomen is flat.     Palpations: Abdomen is soft.  Neurological:     General: No focal deficit present.     Mental Status: She is alert.     (  all labs ordered are listed, but only abnormal results are displayed) Labs Reviewed  CBC WITH DIFFERENTIAL/PLATELET - Abnormal; Notable for the following components:      Result Value   RBC 3.08 (*)    Hemoglobin 8.6 (*)    HCT 26.0 (*)    RDW 32.4 (*)    All other components within normal limits  COMPREHENSIVE METABOLIC PANEL WITH GFR - Abnormal; Notable for the following components:   Sodium 131 (*)    Chloride 97 (*)    CO2 18 (*)    Glucose, Bld 105 (*)    Total Protein 8.9 (*)    AST 313 (*)    ALT 177 (*)    Total Bilirubin 1.7 (*)    Anion gap 16 (*)    All other components within normal limits    EKG: None  Radiology: No results found.   Procedures   Medications Ordered in the ED  HYDROmorphone  (DILAUDID ) injection 2 mg (2 mg  Intravenous Given 05/15/24 1357)  ondansetron  (ZOFRAN ) injection 4 mg (4 mg Intravenous Given 05/15/24 1400)                                    Medical Decision Making 48 year old female here today with malignant pain.  Plan- patient interviewed with Spanish interpreter.  Husband and son at bedside.  I had a lengthy discussion at bedside with the family about her prognosis.  I confirmed that they are not interested in pursuing additional treatments.  Their priority is comfort and symptom management.  They had heard of hospice but had many additional questions.  I explained what hospice was, and what typical services would be able to offer the patient and the family.  They were very interested in this.  With the patient's pain, I provided her with some IV Dilaudid , Zofran .  I have been able to get her pain improved.  Will admit patient for continued symptom management and hospice consultation.  Amount and/or Complexity of Data Reviewed Labs: ordered. ECG/medicine tests: ordered.  Risk Prescription drug management.        Final diagnoses:  Malignant bone pain    ED Discharge Orders     None          Mannie Fairy DASEN, DO 05/15/24 1407

## 2024-05-16 ENCOUNTER — Encounter: Payer: Self-pay | Admitting: Oncology

## 2024-05-16 ENCOUNTER — Encounter (HOSPITAL_COMMUNITY): Payer: Self-pay | Admitting: Oncology

## 2024-05-16 ENCOUNTER — Other Ambulatory Visit (HOSPITAL_COMMUNITY): Payer: Self-pay

## 2024-05-16 ENCOUNTER — Other Ambulatory Visit: Payer: Self-pay

## 2024-05-16 DIAGNOSIS — E43 Unspecified severe protein-calorie malnutrition: Secondary | ICD-10-CM | POA: Diagnosis present

## 2024-05-16 MED ORDER — ACETAMINOPHEN 325 MG PO TABS: 650.0000 mg | ORAL_TABLET | Freq: Four times a day (QID) | ORAL | Status: DC | PRN

## 2024-05-16 MED ORDER — POLYETHYLENE GLYCOL 3350 17 GM/SCOOP PO POWD
17.0000 g | Freq: Every day | ORAL | 0 refills | Status: DC
  Filled 2024-05-16: qty 238, 14d supply, fill #0

## 2024-05-16 MED ORDER — SENNOSIDES-DOCUSATE SODIUM 8.6-50 MG PO TABS
1.0000 | ORAL_TABLET | Freq: Two times a day (BID) | ORAL | 0 refills | Status: DC
  Filled 2024-05-16: qty 100, 50d supply, fill #0

## 2024-05-16 NOTE — Hospital Course (Signed)
 48yo with h/o Hep C and hepatocellular carcinoma with poor tolerance to chemotherapy who presented on 11/9 with uncontrolled cancer-related pain.  She was previously hospitalized from 11/6-7 for this issue and went home with MS Contin , MS IR, and outpatient palliative care.  Given her uncontrolled pain, will request hospice consult.

## 2024-05-16 NOTE — Plan of Care (Signed)
 Discharge instructions given to patient using the IPAD interpreter, she verbalized her understanding of instructions given, PIV intact upon removal, vitals stable, pt going home by private vehicle.   Problem: Education: Goal: Knowledge of General Education information will improve Description: Including pain rating scale, medication(s)/side effects and non-pharmacologic comfort measures Outcome: Adequate for Discharge   Problem: Health Behavior/Discharge Planning: Goal: Ability to manage health-related needs will improve Outcome: Adequate for Discharge   Problem: Clinical Measurements: Goal: Ability to maintain clinical measurements within normal limits will improve Outcome: Adequate for Discharge Goal: Will remain free from infection Outcome: Adequate for Discharge Goal: Diagnostic test results will improve Outcome: Adequate for Discharge Goal: Respiratory complications will improve Outcome: Adequate for Discharge Goal: Cardiovascular complication will be avoided Outcome: Adequate for Discharge   Problem: Activity: Goal: Risk for activity intolerance will decrease Outcome: Adequate for Discharge   Problem: Nutrition: Goal: Adequate nutrition will be maintained Outcome: Adequate for Discharge   Problem: Coping: Goal: Level of anxiety will decrease Outcome: Adequate for Discharge   Problem: Elimination: Goal: Will not experience complications related to bowel motility Outcome: Adequate for Discharge Goal: Will not experience complications related to urinary retention Outcome: Adequate for Discharge   Problem: Pain Managment: Goal: General experience of comfort will improve and/or be controlled Outcome: Adequate for Discharge   Problem: Safety: Goal: Ability to remain free from injury will improve Outcome: Adequate for Discharge   Problem: Skin Integrity: Goal: Risk for impaired skin integrity will decrease Outcome: Adequate for Discharge

## 2024-05-16 NOTE — Assessment & Plan Note (Addendum)
 Failed outpatient management - reports now that this was related to constipation and the pain is now resolved after BM Resume MS Contin  and MS IR Needs a good bowel regimen - will send

## 2024-05-16 NOTE — Assessment & Plan Note (Signed)
 -  Appears to be stable at this time

## 2024-05-16 NOTE — Assessment & Plan Note (Addendum)
 Completed 7 radiation sessions, last on 10/31 with Dr. Dewey Decided to stop atezolizumab  due to concern for worsening fatigue and FTT With history of stage 3a hepatocellular carcinoma along with a poor record of tolerance to chemo, expect the patient to have less than 6 months to live Palliative care consulted Hospice consulted Remains full code, will discuss more with family and hospice as an outpatient

## 2024-05-16 NOTE — Progress Notes (Signed)
   05/16/24 1332  TOC Brief Assessment  Insurance and Status Lapsed  Patient has primary care physician Yes (Pasam, Avinash, MD)  Home environment has been reviewed Home with spouse  Prior level of function: Independent  Prior/Current Home Services No current home services  Social Drivers of Health Review SDOH reviewed no interventions necessary  Readmission risk has been reviewed Yes  Transition of care needs no transition of care needs at this time

## 2024-05-16 NOTE — Progress Notes (Signed)
 Discharge meds in a secure bag delivered to patient in room - meds explained to patient  using [employer] interpretor Shasta Planas #[id number]. Patient dressed, to lobby via w/c- home with daughter

## 2024-05-16 NOTE — Consult Note (Signed)
 Consultation Note Date: 05/16/2024   Patient Name: Christina Gutierrez  DOB: [birth year]  MRN: [id number]  Age / Sex: 48 y.o., female  PCP: [name], MD Referring Physician: Barbarann Nest, MD  Reason for Consultation: Establishing goals of care  HPI/Patient Profile: 48 y.o. female admitted on 05/15/2024    Clinical Assessment and Goals of Care: 48 year old lady, known to palliative service, life-limiting illness of hepatocellular carcinoma poor tolerance to chemotherapy Admitted with worsening abdominal pain Patient is on scheduled MS Contin  15 mg every 12 hours as well as morphine  sulfate immediate release 15 mg to be used every 3 hours on an as needed basis for pain. Of note patient is also connected with palliative care at [facility] Patient has finished radiation treatments Palliative consult for goals of care discussions as well as assessment of hospice eligibility. Chart reviewed Patient seen and examined Discussed with patient, family present at bedside Palliative medicine is specialized medical care for people living with serious illness. It focuses on providing relief from the symptoms and stress of a serious illness. The goal is to improve quality of life for both the patient and the family. Goals of care: Broad aims of medical therapy in relation to the patient's values and preferences. Our aim is to provide medical care aimed at enabling patients to achieve the goals that matter most to them, given the circumstances of their particular medical situation and their constraints.  Goals of care discussions undertaken.  Goals wishes and values attempted to be explored.  Symptom management regimen discussed.  Corresponded with hospice liaison as well as primary attending.  NEXT OF KIN Husband and son  SUMMARY OF RECOMMENDATIONS   Recommend home with hospice Continue  current pain and non-- pain symptom management regimen Continue CODE STATUS discussions.  Patient and family would like to discuss further once she is back in the home environment. Thank you for the consult.  Code Status/Advance Care Planning: Full code   Symptom Management:     Palliative Prophylaxis:  Frequent Pain Assessment  Additional Recommendations (Limitations, Scope, Preferences): Full Scope Treatment  Psycho-social/Spiritual:  Desire for further Chaplaincy support:yes Additional Recommendations: Caregiving  Support/Resources  Prognosis:  < 6 months  Discharge Planning: Home with Hospice      Primary Diagnoses: Present on Admission:  Hepatocellular carcinoma (HCC)  Intractable abdominal pain  Cancer-related pain  Anemia of chronic disease  Protein-calorie malnutrition, severe   I have reviewed the medical record, interviewed the patient and family, and examined the patient. The following aspects are pertinent.  Past Medical History:  Diagnosis Date   Cancer (HCC)    Hepatocellular carcinoma (HCC) 08/22/2023   Metabolic acidosis 08/21/2023   Social History   Socioeconomic History   Marital status: Married    Spouse name: Not on file   Number of children: Not on file   Years of education: Not on file   Highest education level: Not on file  Occupational History   Not on file  Tobacco Use   Smoking  status: Never   Smokeless tobacco: Never  Vaping Use   Vaping status: Never Used  Substance and Sexual Activity   Alcohol use: No   Drug use: No   Sexual activity: Not Currently  Other Topics Concern   Not on file  Social History Narrative   Not on file   Social Drivers of Health   Financial Resource Strain: Not on file  Food Insecurity: No Food Insecurity (05/15/2024)   Hunger Vital Sign    Worried About Running Out of Food in the Last Year: Never true    Ran Out of Food in the Last Year: Never true  Transportation Needs: No Transportation  Needs (05/15/2024)   PRAPARE - Administrator, Civil Service (Medical): No    Lack of Transportation (Non-Medical): No  Physical Activity: Not on file  Stress: Not on file  Social Connections: Patient Declined (08/29/2023)   Social Connection and Isolation Panel    Frequency of Communication with Friends and Family: Patient declined    Frequency of Social Gatherings with Friends and Family: Patient declined    Attends Religious Services: Patient declined    Database Administrator or Organizations: Patient declined    Attends Banker Meetings: Patient declined    Marital Status: Patient declined   History reviewed. No pertinent family history. Scheduled Meds:  enoxaparin (LOVENOX) injection  40 mg Subcutaneous Q24H   morphine   15 mg Oral Q12H   senna-docusate  1 tablet Oral BID   Continuous Infusions: PRN Meds:.acetaminophen  **OR** acetaminophen , HYDROmorphone  (DILAUDID ) injection, morphine , polyethylene glycol, prochlorperazine  Medications Prior to Admission:  Prior to Admission medications   Medication Sig Start Date End Date Taking? Authorizing Provider  lidocaine -prilocaine  (EMLA ) cream Apply topically as needed. 05/13/24  Yes [place], Eric J, DO  morphine  (MS CONTIN ) 15 MG 12 hr tablet Take 1 tablet (15 mg total) by mouth every 12 (twelve) hours. 05/13/24  Yes [place], Eric J, DO  morphine  (MSIR) 15 MG tablet Take 1 tablet (15 mg total) by mouth every 3 (three) hours as needed for severe pain (pain score 7-10). 05/13/24  Yes [place], Eric J, DO  ondansetron  (ZOFRAN ) 8 MG tablet Take 1 tablet (8 mg total) by mouth every 8 (eight) hours as needed for nausea or vomiting. 05/13/24  Yes [place], Eric J, DO  promethazine (PHENERGAN) 12.5 MG tablet Take 1 tablet (12.5 mg total) by mouth every 6 (six) hours as needed for nausea or vomiting. 04/30/24  Yes Garrick Charleston, MD  tenofovir  alafenamide (VEMLIDY ) 25 MG tablet Take 1 tablet (25 mg total) by mouth daily. 09/22/23   Yes Fleeta Rothman, Jomarie SAILOR, MD  ALPRAZolam  (XANAX ) 0.5 MG tablet Take 1 tablet (0.5 mg total) by mouth daily as needed for anxiety. Patient not taking: Reported on 05/15/2024 05/13/24   [place], [name] PARAS, DO  megestrol (MEGACE) 40 MG/ML suspension Take 10 mLs (400 mg total) by mouth 2 (two) times daily. Patient not taking: Reported on 05/15/2024 05/08/24   Pickenpack-Cousar, Athena N, NP  pantoprazole  (PROTONIX ) 40 MG tablet Take 1 tablet (40 mg total) by mouth daily at 6 (six) AM. Patient not taking: Reported on 05/15/2024 04/08/24   Pasam, Avinash, MD  polyethylene glycol powder (GLYCOLAX /MIRALAX ) 17 GM/SCOOP powder Take 17 g by mouth daily. Dissolve 1 capful (17g) in 4-8 ounces of liquid and take by mouth daily. Patient not taking: Reported on 05/15/2024 04/19/24   Raenelle Coria, MD  prochlorperazine  (COMPAZINE ) 10 MG tablet Take 1 tablet (10 mg  total) by mouth every 6 (six) hours as needed for nausea or vomiting. 04/29/24   Pasam, Chinita, MD  senna-docusate (SENOKOT-S) 8.6-50 MG tablet Take 1 tablet by mouth 2 (two) times daily. Patient not taking: Reported on 05/15/2024 05/13/24 08/11/24  [place], Eric J, DO   No Known Allergies Review of Systems +abd pain  Physical Exam Weak appearing lady resting in bed Cancer related cachexia evident Mild generalized abdominal discomfort No peripheral edema Awake alert oriented Mood and affect within normal limits  Vital Signs: BP 106/63 (BP Location: Right Arm)   Pulse 77   Temp 98.4 F (36.9 C) (Oral)   Resp 16   Ht 5' (1.524 m)   Wt 45.7 kg   SpO2 97%   BMI 19.68 kg/m  Pain Scale: 0-10   Pain Score: 0-No pain   SpO2: SpO2: 97 % O2 Device:SpO2: 97 % O2 Flow Rate: .   IO: Intake/output summary:  Intake/Output Summary (Last 24 hours) at 05/16/2024 1256 Last data filed at 05/16/2024 1100 Gross per 24 hour  Intake 240 ml  Output --  Net 240 ml    LBM: Last BM Date : 05/15/24 Baseline Weight: Weight: 47.6 kg Most recent weight: Weight:  45.7 kg     Palliative Assessment/Data:   PPS 50%  Time In:  11 Time Out:  12.15 Time Total:  75 Greater than 50%  of this time was spent counseling and coordinating care related to the above assessment and plan.  Signed by: Lonia Serve, MD   Please contact Palliative Medicine Team phone at [phone] for questions and concerns.  For individual provider: See Tracey

## 2024-05-16 NOTE — TOC Transition Note (Incomplete)
 Transition of Care [facility]) - Discharge Note   Patient Details  Name: Niani Mourer MRN: [id number] Date of Birth: [birth year]  [facility] CM/SW Contact:  Toy LITTIE Agar, RN Phone Number:[phone]  05/16/2024, 2:00 PM   Clinical Narrative:    Patient discharging home with [facility] to follow. Per Hospice there are no DME needs.   Final next level of care: Home w Hospice Care [facility]) Barriers to Discharge: No Barriers Identified   Patient Goals and CMS Choice     Choice offered to / list presented to : Patient (Hospice)  ownership interest in [facility].provided to::  (n/a)    Discharge Placement                       Discharge Plan and Services Additional resources added to the After Visit Summary for                  DME Arranged: N/A         HH Arranged:  (Hospice with [facility]) HH Agency: Other - See comment ([facility]) Date HH Agency Contacted: 05/16/24 Time HH Agency Contacted: 1400 Representative spoke with at [facility] Agency: Eleanor Nail LPN  Social Drivers of Health (SDOH) Interventions SDOH Screenings   Food Insecurity: No Food Insecurity (05/15/2024)  Housing: Low Risk  (05/15/2024)  Transportation Needs: No Transportation Needs (05/15/2024)  Utilities: Not At Risk (05/15/2024)  Depression (PHQ2-9): Low Risk  (04/29/2024)  Social Connections: Patient Declined (08/29/2023)  Tobacco Use: Low Risk  (05/15/2024)     Readmission Risk Interventions     No data to display

## 2024-05-16 NOTE — Progress Notes (Signed)
 [facility] Liaison Note  Received request from IP CM [facility] for hospice services at home after discharge. Spoke with patient via interpreter to initiate education related to hospice philosophy, services and team approach to care. Patient verbalized understanding of information given. Per discussion, the plan is for discharge home today.  DME needs discussed. Patient has the following equipment in the home: none. Family requests the following equipment for delivery: none at this time.   Please send signed and completed DNR home with patient/family. Please provide prescriptions at discharge as needed to ensure ongoing symptom management.  [facility] information and contact numbers given to patient. Please call with any concerns.  Thank you for the opportunity to participate in this patient's care.   Eleanor Nail, LPN [facility] Liaison [phone]

## 2024-05-16 NOTE — TOC Initial Note (Signed)
 Transition of Care [facility]) - Initial/Assessment Note    Patient Details  Name: Christina Gutierrez MRN: [id number] Date of Birth: 06-05-1976  Transition of Care [facility]) CM/SW Contact:    Toy LITTIE Agar, RN Phone [phone]  05/16/2024, 1:33 PM  Clinical Narrative:                 CM has received message that patient is agreeable to home with Hospice with choice being [facility]. [facility] acknowledges consult via secure chat and has followed up with patient at bedside.        Patient Goals and CMS Choice            Expected Discharge Plan and Services         Expected Discharge Date: 05/16/24                                    Prior Living Arrangements/Services                       Activities of Daily Living   ADL Screening (condition at time of admission) Independently performs ADLs?: Yes (appropriate for developmental age) Is the patient deaf or have difficulty hearing?: No Does the patient have difficulty seeing, even when wearing glasses/contacts?: No Does the patient have difficulty concentrating, remembering, or making decisions?: No  Permission Sought/Granted                  Emotional Assessment              Admission diagnosis:  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, 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] Intractable abdominal pain [R10.9] Patient Active Problem List   Diagnosis Date Noted   Protein-calorie malnutrition, severe 05/16/2024   Intractable abdominal pain 05/15/2024   Intractable nausea and vomiting 04/24/2024   Anemia of chronic disease 04/17/2024   Palliative care encounter 04/17/2024   Medication management 04/17/2024   Counseling and coordination of care 04/17/2024   Goals of care, counseling/discussion 04/17/2024   Abdominal pain 03/17/2024   Dehydration 03/14/2024   Gingival bleeding 11/13/2023   Port-A-Cath in place 10/23/2023   Situational insomnia 10/02/2023   Cancer-related pain 08/28/2023   Portal venous hypertension  (HCC) 08/23/2023   Intractable pain 08/23/2023   Hepatocellular carcinoma (HCC) 08/22/2023   Hepatic failure (HCC) 08/21/2023   Normocytic anemia 08/21/2023   Transaminitis 08/21/2023   Disseminated varicella 08/21/2023   Hyperammonemia 08/21/2023   Chronic viral hepatitis B without delta-agent (HCC) 08/21/2023   PCP:  [name], MD Pharmacy:   [facility] [address] [place] [address] Phone: [phone] Fax: [phone]     Social Drivers of Health (SDOH) Social History: SDOH Screenings   Food Insecurity: No Food Insecurity (05/15/2024)  Housing: Low Risk  (05/15/2024)  Transportation Needs: No Transportation Needs (05/15/2024)  Utilities: Not At Risk (05/15/2024)  Depression (PHQ2-9): Low Risk  (04/29/2024)  Social Connections: Patient Declined (08/29/2023)  Tobacco Use: Low Risk  (05/15/2024)   SDOH Interventions:     Readmission Risk Interventions     No data to display

## 2024-05-16 NOTE — Discharge Summary (Signed)
 Physician Discharge Summary   Patient: Christina Gutierrez MRN: [id number] DOB: 12/08/1975  Admit date:     05/15/2024  Discharge date: 05/16/24  Discharge Physician: Delon Herald   PCP: [name], MD   Recommendations at discharge:   You are being discharged home with home hospice Continue MS Contin  and MS IR as needed for pain Resume Miralax  and Senokot-S, as good bowel regimen is essential Follow up with Dr. Autumn as directed or with hospice physician  Discharge Diagnoses: Principal Problem:   Intractable abdominal pain Active Problems:   Cancer-related pain   Hepatocellular carcinoma (HCC)   Anemia of chronic disease   Protein-calorie malnutrition, severe    Hospital Course: 48yo with h/o Hep C and hepatocellular carcinoma with poor tolerance to chemotherapy who presented on 11/9 with uncontrolled cancer-related pain.  She was previously hospitalized from 11/6-7 for this issue and went home with MS Contin , MS IR, and outpatient palliative care.  Given her uncontrolled pain, will request hospice consult.  Assessment and Plan:  Assessment & Plan Intractable abdominal pain Cancer-related pain Failed outpatient management - reports now that this was related to constipation and the pain is now resolved after BM Resume MS Contin  and MS IR Needs a good bowel regimen - will send Hepatocellular carcinoma (HCC) Completed 7 radiation sessions, last on 10/31 with Dr. Dewey Decided to stop atezolizumab  due to concern for worsening fatigue and FTT With history of stage 3a hepatocellular carcinoma along with a poor record of tolerance to chemo, expect the patient to have less than 6 months to live Palliative care consulted Hospice consulted Remains full code, will discuss more with family and hospice as an outpatient Anemia of chronic disease Appears to be stable at this time Protein-calorie malnutrition, severe Noted Patient is going home with hospice       Consultants: Palliative care Hospice [facility] team  Procedures: None  Antibiotics: None   Pain control - [place]  Controlled Substance Reporting System database was reviewed. and patient was instructed, not to drive, operate heavy machinery, perform activities at heights, swimming or participation in water activities or provide baby-sitting services while on Pain, Sleep and Anxiety Medications; until their outpatient Physician has advised to do so again. Also recommended to not to take more than prescribed Pain, Sleep and Anxiety Medications.   Disposition: Home with hospice Diet recommendation:  Regular diet DISCHARGE MEDICATION: Allergies as of 05/16/2024   No Known Allergies      Medication List     STOP taking these medications    ALPRAZolam  0.5 MG tablet Commonly known as: XANAX    megestrol 40 MG/ML suspension Commonly known as: MEGACE   pantoprazole  40 MG tablet Commonly known as: PROTONIX    Vemlidy  25 MG tablet Generic drug: tenofovir  alafenamide       TAKE these medications    acetaminophen  325 MG tablet Commonly known as: TYLENOL  Take 2 tablets (650 mg total) by mouth every 6 (six) hours as needed for mild pain (pain score 1-3) or fever (or Fever >/= 101).   lidocaine -prilocaine  cream Commonly known as: EMLA  Aplique segn sea necesario. (Apply topically as needed.)   morphine  15 MG 12 hr tablet Commonly known as: MS CONTIN  Take 1 tablet (15 mg total) by mouth every 12 (twelve) hours.   morphine  15 MG tablet Commonly known as: MSIR Take 1 tablet (15 mg total) by mouth every 3 (three) hours as needed for severe pain (pain score 7-10).   ondansetron  8 MG tablet Commonly known as:  ZOFRAN  Take 1 tablet (8 mg total) by mouth every 8 (eight) hours as needed for nausea or vomiting.   polyethylene glycol powder 17 GM/SCOOP powder Commonly known as: GLYCOLAX /MIRALAX  Take 17 g by mouth daily. Dissolve 1 capful (17g) in 4-8 ounces of liquid  and take by mouth daily.   prochlorperazine  10 MG tablet Commonly known as: COMPAZINE  Take 1 tablet (10 mg total) by mouth every 6 (six) hours as needed for nausea or vomiting.   promethazine 12.5 MG tablet Commonly known as: PHENERGAN Take 1 tablet (12.5 mg total) by mouth every 6 (six) hours as needed for nausea or vomiting.   senna-docusate 8.6-50 MG tablet Commonly known as: Senokot-S Take 1 tablet by mouth 2 (two) times daily.        Discharge Exam:     Subjective: Feeling much better today after having had a BM.  Wants to go home.   Objective: Vitals:   05/16/24 0019 05/16/24 0515  BP: 102/65 106/63  Pulse: 77 77  Resp: 16 16  Temp: 99 F (37.2 C) 98.4 F (36.9 C)  SpO2: 97% 97%    Intake/Output Summary (Last 24 hours) at 05/16/2024 1302 Last data filed at 05/16/2024 1100 Gross per 24 hour  Intake 240 ml  Output --  Net 240 ml   Filed Weights   05/15/24 1241 05/15/24 1639  Weight: 47.6 kg 45.7 kg    Exam:  General:  Appears calm and comfortable and is in NAD, frail, thin Eyes:  normal lids, iris ENT:  grossly normal hearing, lips & tongue, mmm Cardiovascular:  RRR. No LE edema.  Respiratory:   CTA bilaterally with no wheezes/rales/rhonchi.  Normal respiratory effort. Abdomen:  soft, NT, ND Skin:  no rash or induration seen on limited exam Musculoskeletal:  grossly normal tone BUE/BLE, good ROM, no bony abnormality Psychiatric:  blunted mood and affect, speech fluent and appropriate, AOx3 Neurologic:  CN 2-12 grossly intact, moves all extremities in coordinated fashion  Data Reviewed: I have reviewed the patient's lab results since admission.  Pertinent labs for today include:   Na++ 131 CO2 18 Anion gap 16 AST 313/ALT 177/Bili 1.7 WBC 5.9 Hgb 8.6    Condition at discharge: fair  The results of significant diagnostics from this hospitalization (including imaging, microbiology, ancillary and laboratory) are listed below for reference.    Imaging Studies: CT ABDOMEN PELVIS W CONTRAST Result Date: 05/12/2024 CLINICAL DATA:  Unable to eat abdominal pain EXAM: CT ABDOMEN AND PELVIS WITH CONTRAST TECHNIQUE: Multidetector CT imaging of the abdomen and pelvis was performed using the standard protocol following bolus administration of intravenous contrast. RADIATION DOSE REDUCTION: This exam was performed according to the departmental dose-optimization program which includes automated exposure control, adjustment of the mA and/or kV according to patient size and/or use of iterative reconstruction technique. CONTRAST:  80mL OMNIPAQUE  IOHEXOL  300 MG/ML  SOLN COMPARISON:  CT 04/30/2024, and multiple prior exams dating back to 08/21/2023 FINDINGS: Lower chest: Lung bases demonstrate no acute airspace disease. Central venous catheter tip visualized in the right atrium. Hepatobiliary: Liver enlargement with multiple hepatic masses including dominant right hepatic mass measuring about 15.5 x 18.4 x 15.7 cm, grossly similar in the short interval. Gallstones. Pancreas: Displaced to the left hemiabdomen. No inflammation or ductal dilatation. Spleen: Normal in size without focal abnormality. Adrenals/Urinary Tract: Right adrenal gland poorly visible. Left adrenal gland is normal. Cyst in the right kidney for which no imaging follow-up is recommended. No hydronephrosis. The bladder is unremarkable Stomach/Bowel: Stomach  and duodenum displaced to the left with mass effect from large hepatic masses. No dilated small bowel. No acute bowel wall thickening. Vascular/Lymphatic: Nonaneurysmal aorta. Mass effect on the IVC, hepatic veins and portal vessels secondary to large liver masses but no direct evidence for a thrombus. Splenic vein is also patent. Numerous varices and collateral vessels are again noted. Reproductive: Uterus and bilateral adnexa are unremarkable. Other: No free air.  Small volume free fluid in the pelvis Musculoskeletal: No acute osseous  abnormality IMPRESSION: 1. No CT evidence for acute interval change compared to prior exam. 2. Hepatomegaly with multiple hepatic masses, grossly similar in the short interval corresponding to history of known malignancy. Mass effect on the IVC, hepatic veins and portal vessels but no visualized thrombus or occlusion. Mass effect on the stomach, duodenum, and pancreas which are displaced to the left hemiabdomen as before but no obstructive features. 3. Small volume free fluid in the pelvis. 4. Gallstones. Electronically Signed   By: Luke Bun M.D.   On: 05/12/2024 15:30   CT Thoracic Spine Wo Contrast Result Date: 05/04/2024 EXAM: CT THORACIC SPINE WITHOUT CONTRAST 05/04/2024 01:07:19 PM TECHNIQUE: CT of the thoracic spine was performed without the administration of intravenous contrast. Multiplanar reformatted images are provided for review. Automated exposure control, iterative reconstruction, and/or weight based adjustment of the mA/kV was utilized to reduce the radiation dose to as low as reasonably achievable. COMPARISON: CTA chest 01/18/2024. CLINICAL HISTORY: Back trauma, no prior imaging (Age >= 16y). FINDINGS: BONES AND ALIGNMENT: Normal vertebral body heights. Alignment is maintained. Partial fusion of the T3 and T4 vertebral bodies and posterior elements. No acute fracture or suspicious bone lesion. DEGENERATIVE CHANGES: There is no CT evidence of large disc herniation or high grade osseous spinal canal stenosis. SOFT TISSUES: Partially visualized right IJ approach central venous catheter. The paraspinal soft tissues are unremarkable. LIVER: Partially visualized masses within the right hepatic lobe better evaluated on the prior CT from July 2025. IMPRESSION: 1. No acute abnormality of the thoracic spine related to trauma. 2. Partial fusion of the T3 and T4 vertebral bodies and posterior elements. Electronically signed by: Donnice Mania MD 05/04/2024 02:23 PM EDT RP Workstation: [id number]   CT  Lumbar Spine Wo Contrast Result Date: 05/04/2024 EXAM: CT of the Lumbar Spine Without Contrast 05/04/2024 01:07:19 PM TECHNIQUE: CT of the lumbar spine was performed without the administration of intravenous contrast. Multiplanar reformatted images are provided for review. Automated exposure control, iterative reconstruction, and/or weight based adjustment of the mA/kV was utilized to reduce the radiation dose to as low as reasonably achievable. COMPARISON: None available. CLINICAL HISTORY: Back trauma, no prior imaging (Age >= 16y). FINDINGS: BONES AND ALIGNMENT: Normal vertebral body heights. No acute fracture or suspicious bone lesion. Normal alignment. DEGENERATIVE CHANGES: No significant degenerative changes. SOFT TISSUES: No acute abnormality. IMPRESSION: 1. Unremarkable CT of the lumbar spine Electronically signed by: Norman Gatlin MD 05/04/2024 02:22 PM EDT RP Workstation: [id number]   CT ABDOMEN PELVIS W CONTRAST Result Date: 04/30/2024 CLINICAL DATA:  Abdominal pain, acute, nonlocalized. Abdominal and hip pain. EXAM: CT ABDOMEN AND PELVIS WITH CONTRAST TECHNIQUE: Multidetector CT imaging of the abdomen and pelvis was performed using the standard protocol following bolus administration of intravenous contrast. RADIATION DOSE REDUCTION: This exam was performed according to the departmental dose-optimization program which includes automated exposure control, adjustment of the mA and/or kV according to patient size and/or use of iterative reconstruction technique. CONTRAST:  80mL OMNIPAQUE  IOHEXOL  300 MG/ML  SOLN COMPARISON:  04/24/2024. FINDINGS: Lower chest: The distal tip of a right central venous catheter terminates in the right atrium. The heart is enlarged. Strandy atelectasis is noted at the lung bases. Hepatobiliary: The liver is enlarged with a lobular contour and multiple heterogeneous masses, similar in appearance to the prior exam. Stones are present within the gallbladder. The common bile  duct is normal in caliber. Areas of mild biliary ductal dilatation are noted in the liver. Pancreas: Unremarkable. No pancreatic ductal dilatation or surrounding inflammatory changes. Spleen: Normal in size without focal abnormality. Adrenals/Urinary Tract: The adrenal glands are not well delineated on exam. The kidneys enhance symmetrically. Cysts are noted in the right kidney. No renal calculus or hydronephrosis bilaterally. The bladder is unremarkable. Stomach/Bowel: The stomach is within normal limits. No bowel obstruction, free air, or pneumatosis is seen. A moderate amount of retained stool is present in the colon. Appendix appears normal and contains air. There is fecalization of the small bowel, possible stasis. 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 The portal vein, IVC, splenic vein, and superior mesenteric veins are patent. The aorta is normal in caliber. A few prominent lymph nodes are noted in the gastrohepatic ligament, which may be reactive. Reproductive: Uterus and bilateral adnexa are unremarkable. Other: Small amount of free fluid is noted in the pelvis. Musculoskeletal: No acute osseous abnormality. IMPRESSION: 1. No acute intra-abdominal process. 2. Enlarged heterogeneous liver containing multiple masses, compatible with known hepatocellular carcinoma. 3. Cholelithiasis. 4. Small amount of free fluid in the pelvis. Electronically Signed   By: Leita Birmingham M.D.   On: 04/30/2024 18:56   MR Brain W and Wo Contrast Result Date: 04/24/2024 EXAM: MRI BRAIN WITH AND WITHOUT CONTRAST 04/24/2024 08:49:11 AM TECHNIQUE: Multiplanar multisequence MRI of the head/brain was performed with and without the administration of intravenous contrast. COMPARISON: MRI of the head dated 02/12/2024. CLINICAL HISTORY: Metastatic disease evaluation. Prior 02/12/2024. 5mL Gadavist  given. FINDINGS: BRAIN AND VENTRICLES: No acute infarct. No acute intracranial hemorrhage. No mass effect or  midline shift. No hydrocephalus. The sella is unremarkable. Normal flow voids. No mass or abnormal enhancement. ORBITS: No acute abnormality. SINUSES: No acute abnormality. BONES AND SOFT TISSUES: Normal bone marrow signal and enhancement. No acute soft tissue abnormality. IMPRESSION: 1. No acute intracranial abnormality. 2. No mass or abnormal enhancement. Electronically signed by: Evalene Coho MD 04/24/2024 09:00 AM EDT RP Workstation: [id number]   CT ABDOMEN PELVIS W CONTRAST Result Date: 04/24/2024 EXAM: CT ABDOMEN AND PELVIS WITH CONTRAST 04/24/2024 02:31:11 AM TECHNIQUE: CT of the abdomen and pelvis was performed with the administration of 100 mL of iohexol  (OMNIPAQUE ) 300 MG/ML solution. Multiplanar reformatted images are provided for review. Automated exposure control, iterative reconstruction, and/or weight-based adjustment of the mA/kV was utilized to reduce the radiation dose to as low as reasonably achievable. COMPARISON: CT abdomen and pelvis 04/17/2024, CT abdomen and pelvis 09/18/2023. CLINICAL HISTORY: Bowel obstruction suspected. FINDINGS: LOWER CHEST: No acute abnormality. LIVER: Redemonstration of multiple heterogeneous masses of the liver consistent with known multifocal hepatocellular carcinoma with largest lesion measuring up to 16 cm (2: 29). Persistent mass effect on the inferior vena cava from the largest mass. GALLBLADDER AND BILE DUCTS: Gallbladder is unremarkable. No biliary ductal dilatation. SPLEEN: Splenorenal shunt again noted. PANCREAS: No acute abnormality. ADRENAL GLANDS: No acute abnormality. KIDNEYS, URETERS AND BLADDER: No stones in the kidneys or ureters. No hydronephrosis. No perinephric or periureteral stranding. Urinary bladder is unremarkable. GI AND BOWEL: Stomach demonstrates no acute abnormality. No small or large  bowel wall thickening. Appendix is unremarkable. There is no bowel obstruction. PERITONEUM AND RETROPERITONEUM: No ascites. No free air. VASCULATURE:  Aorta is normal in caliber. Portal, splenic, superior mesenteric vein are patent. LYMPH NODES: No lymphadenopathy. REPRODUCTIVE ORGANS: Uterus is unremarkable. No adnexal masses. BONES AND SOFT TISSUES: No acute osseous abnormality. No focal soft tissue abnormality. IMPRESSION: 1. Redemonstration of multifocal hepatocellular carcinoma with largest lesion measuring up to 16 cm, causing persistent mass effect on the inferior vena cava. 2. No evidence of bowel obstruction. Electronically signed by: Morgane Naveau MD 04/24/2024 02:58 AM EDT RP Workstation: [id number]   CT ABDOMEN PELVIS W CONTRAST Result Date: 04/17/2024 CLINICAL DATA:  Epigastric pain EXAM: CT ABDOMEN AND PELVIS WITH CONTRAST TECHNIQUE: Multidetector CT imaging of the abdomen and pelvis was performed using the standard protocol following bolus administration of intravenous contrast. RADIATION DOSE REDUCTION: This exam was performed according to the departmental dose-optimization program which includes automated exposure control, adjustment of the mA and/or kV according to patient size and/or use of iterative reconstruction technique. CONTRAST:  OMNIPAQUE  IOHEXOL  300 MG/ML  SOLN COMPARISON:  03/06/2024 FINDINGS: Lower chest: No acute abnormality. Hepatobiliary: Liver again demonstrates multiple heterogeneous mass lesions similar to that seen on the most recent CT examination consistent with the given clinical history of multifocal hepatocellular carcinoma. The largest of these measures 16.4 cm in greatest dimension. This causes significant mass effect upon the hepatic hilar vasculature as well as the IVC. These changes are stable in appearance. Gallbladder is partially distended with evidence of cholelithiasis. No complicating factors are seen. Pancreas: Unremarkable. No pancreatic ductal dilatation or surrounding inflammatory changes. Spleen: Normal in size without focal abnormality. Adrenals/Urinary Tract: Adrenal glands are within normal  limits. Kidneys demonstrate a normal enhancement pattern. No renal calculi or obstructive changes are seen. Stable right renal cyst is noted. No follow-up is recommended. Bladder is partially distended. Stomach/Bowel: No obstructive or inflammatory changes of the colon are seen. The appendix is not well visualized. Small bowel and stomach are within normal limits. No inflammatory changes to suggest appendicitis are noted. Vascular/Lymphatic: Aortic atherosclerosis. No enlarged abdominal or pelvic lymph nodes. 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 Reproductive: Uterus and bilateral adnexa are unremarkable. Other: No free fluid is noted within the pelvis. Musculoskeletal: No acute or significant osseous findings. IMPRESSION: Changes consistent with the known history of multifocal hepatocellular carcinoma. Previously seen small bowel obstruction has resolved. Splenorenal shunt stable from the prior study. Cholelithiasis without complicating factors. Electronically Signed   By: Oneil Devonshire M.D.   On: 04/17/2024 00:29   DG Chest 2 View Result Date: 04/16/2024 CLINICAL DATA:  Shortness of breath. Receiving chemotherapy for liver cancer. EXAM: CHEST - 2 VIEW COMPARISON:  January 15, 2013 FINDINGS: A right-sided venous Port-A-Cath is seen with its distal tip noted at the junction of the superior vena cava and right atrium. The heart size and mediastinal contours are within normal limits. Low lung volumes are noted. Mild atelectasis is suspected within the bilateral lung bases. No acute infiltrate, pleural effusion or pneumothorax is identified. The visualized skeletal structures are unremarkable. IMPRESSION: Low lung volumes with mild bibasilar atelectasis. Electronically Signed   By: Suzen Dials M.D.   On: 04/16/2024 21:05    Microbiology: Results for orders placed or performed during the hospital encounter of 09/27/23  Resp panel by RT-PCR (RSV, Flu A&B, Covid)  Anterior Nasal Swab     Status: None   Collection Time: 09/27/23 12:21 AM  Specimen: Anterior Nasal Swab  Result Value Ref Range Status   SARS Coronavirus 2 by RT PCR NEGATIVE NEGATIVE Final    Comment: (NOTE) SARS-CoV-2 target nucleic acids are NOT DETECTED.  The SARS-CoV-2 RNA is generally detectable in upper respiratory specimens during the acute phase of infection. The lowest concentration of SARS-CoV-2 viral copies this assay can detect is 138 copies/mL. A negative result does not preclude SARS-Cov-2 infection and should not be used as the sole basis for treatment or other patient management decisions. A negative result may occur with  improper specimen collection/handling, submission of specimen other than nasopharyngeal swab, presence of viral mutation(s) within the areas targeted by this assay, and inadequate number of viral copies(<138 copies/mL). A negative result must be combined with clinical observations, patient history, and epidemiological information. The expected result is Negative.  Fact Sheet for Patients:  bloggercourse.com  Fact Sheet for Healthcare Providers:  seriousbroker.it  This test is no t yet approved or cleared by the [place]  FDA and  has been authorized for detection and/or diagnosis of SARS-CoV-2 by FDA under an Emergency Use Authorization (EUA). This EUA will remain  in effect (meaning this test can be used) for the duration of the COVID-19 declaration under Section 564(b)(1) of the Act, 21 U.S.C.section 360bbb-3(b)(1), unless the authorization is terminated  or revoked sooner.       Influenza A by PCR NEGATIVE NEGATIVE Final   Influenza B by PCR NEGATIVE NEGATIVE Final    Comment: (NOTE) The Xpert Xpress SARS-CoV-2/FLU/RSV plus assay is intended as an aid in the diagnosis of influenza from Nasopharyngeal swab specimens and should not be used as a sole basis for treatment. Nasal washings  and aspirates are unacceptable for Xpert Xpress SARS-CoV-2/FLU/RSV testing.  Fact Sheet for Patients: bloggercourse.com  Fact Sheet for Healthcare Providers: seriousbroker.it  This test is not yet approved or cleared by the [place]  FDA and has been authorized for detection and/or diagnosis of SARS-CoV-2 by FDA under an Emergency Use Authorization (EUA). This EUA will remain in effect (meaning this test can be used) for the duration of the COVID-19 declaration under Section 564(b)(1) of the Act, 21 U.S.C. section 360bbb-3(b)(1), unless the authorization is terminated or revoked.     Resp Syncytial Virus by PCR NEGATIVE NEGATIVE Final    Comment: (NOTE) Fact Sheet for Patients: bloggercourse.com  Fact Sheet for Healthcare Providers: seriousbroker.it  This test is not yet approved or cleared by the [place]  FDA and has been authorized for detection and/or diagnosis of SARS-CoV-2 by FDA under an Emergency Use Authorization (EUA). This EUA will remain in effect (meaning this test can be used) for the duration of the COVID-19 declaration under Section 564(b)(1) of the Act, 21 U.S.C. section 360bbb-3(b)(1), unless the authorization is terminated or revoked.  Performed at [facility], [address], [place], [place] [address]     Labs: CBC: Recent Labs  Lab 05/11/24 1214 05/12/24 1342 05/12/24 1353 05/13/24 0555 05/15/24 1323  WBC 5.8 5.9  --  5.3 5.9  NEUTROABS 4.9 4.4  --   --  4.6  HGB 8.3* 8.1* 8.8* 7.9* 8.6*  HCT 23.8* 24.1* 26.0* 24.1* 26.0*  MCV 81.0 84.6  --  85.8 84.4  PLT 249 200  --  193 238   Basic Metabolic Panel: Recent Labs  Lab 05/12/24 1342 05/12/24 1353 05/13/24 0555 05/15/24 1323  NA 135 138 134* 131*  K 3.9 3.8 4.4 4.4  CL 102 106 102 97*  CO2  20*  --  22 18*  GLUCOSE 118* 120* 79 105*  BUN 9 8 10 8    CREATININE 0.52 0.50 0.49 0.66  CALCIUM 9.0  --  9.3 9.7   Liver Function Tests: Recent Labs  Lab 05/12/24 1342 05/15/24 1323  AST 411* 313*  ALT 194* 177*  ALKPHOS 116 123  BILITOT 1.0 1.7*  PROT 7.7 8.9*  ALBUMIN 3.7 3.7   CBG: No results for input(s): GLUCAP in the last 168 hours.  Discharge time spent: greater than 30 minutes.  Signed: Delon Herald, MD [facility] 05/16/2024

## 2024-05-16 NOTE — Assessment & Plan Note (Signed)
 Noted Patient is going home with hospice

## 2024-05-16 NOTE — Plan of Care (Signed)
  Problem: Clinical Measurements: Goal: Will remain free from infection Outcome: Progressing   Problem: Activity: Goal: Risk for activity intolerance will decrease Outcome: Progressing   Problem: Elimination: Goal: Will not experience complications related to bowel motility Outcome: Progressing Goal: Will not experience complications related to urinary retention Outcome: Progressing   Problem: Pain Managment: Goal: General experience of comfort will improve and/or be controlled Outcome: Progressing

## 2024-05-17 ENCOUNTER — Other Ambulatory Visit: Payer: Self-pay | Admitting: Infectious Disease

## 2024-05-17 ENCOUNTER — Other Ambulatory Visit: Payer: Self-pay

## 2024-05-17 ENCOUNTER — Telehealth: Payer: Self-pay

## 2024-05-17 MED ORDER — VEMLIDY 25 MG PO TABS
25.0000 mg | ORAL_TABLET | Freq: Every day | ORAL | 5 refills | Status: DC
  Filled 2024-05-17 – 2024-05-25 (×2): qty 30, 30d supply, fill #0
  Filled 2024-06-17 (×2): qty 30, 30d supply, fill #1

## 2024-05-17 NOTE — Telephone Encounter (Signed)
 Followed up with voicemail left from [facility] staff person, Star, who informed me that patient has reconsidered and would like to resume cancer treatment. Dr. Autumn aware. Patient is scheduled to see Dr. Autumn this Friday when this request will be addressed.

## 2024-05-18 ENCOUNTER — Other Ambulatory Visit: Payer: Self-pay | Admitting: Oncology

## 2024-05-18 ENCOUNTER — Other Ambulatory Visit: Payer: Self-pay

## 2024-05-18 DIAGNOSIS — C220 Liver cell carcinoma: Secondary | ICD-10-CM

## 2024-05-19 ENCOUNTER — Ambulatory Visit: Payer: Self-pay

## 2024-05-19 VITALS — BP 102/62 | HR 82 | Temp 98.2°F | Resp 18 | Ht 60.0 in | Wt 102.0 lb

## 2024-05-19 DIAGNOSIS — C220 Liver cell carcinoma: Secondary | ICD-10-CM

## 2024-05-19 DIAGNOSIS — E860 Dehydration: Secondary | ICD-10-CM

## 2024-05-19 MED ORDER — SODIUM CHLORIDE 0.9 % IV BOLUS
1000.0000 mL | Freq: Once | INTRAVENOUS | Status: AC
  Administered 2024-05-19: 1000 mL via INTRAVENOUS
  Filled 2024-05-19: qty 1000

## 2024-05-19 MED ORDER — ONDANSETRON HCL 4 MG/2ML IJ SOLN
8.0000 mg | Freq: Once | INTRAMUSCULAR | Status: AC
  Administered 2024-05-19: 8 mg via INTRAVENOUS
  Filled 2024-05-19: qty 4

## 2024-05-19 NOTE — Progress Notes (Signed)
 Diagnosis: Dehydration  Provider:  Mannam, Praveen MD  Procedure: IV Infusion  IV Type: Peripheral, IV Location: R Forearm  Normal Saline, Dose: 1000 ml  Infusion Start Time: 1205  Infusion Stop Time: 1416  Procedure: IV Push  Zofran  (ondansetron ), Dose: 8 mg   Post Infusion IV Care: Peripheral IV Discontinued  Discharge: Condition: Good, Destination: Home . AVS Declined.  Performed by:  Rocky FORBES Sar, RN

## 2024-05-20 ENCOUNTER — Inpatient Hospital Stay: Payer: Self-pay

## 2024-05-20 ENCOUNTER — Inpatient Hospital Stay: Payer: Self-pay | Admitting: Oncology

## 2024-05-20 ENCOUNTER — Other Ambulatory Visit: Payer: Self-pay

## 2024-05-20 VITALS — BP 90/56 | HR 84 | Temp 100.4°F | Resp 17 | Ht 60.0 in | Wt 104.8 lb

## 2024-05-20 DIAGNOSIS — C220 Liver cell carcinoma: Secondary | ICD-10-CM

## 2024-05-20 LAB — CBC WITH DIFFERENTIAL (CANCER CENTER ONLY)
Abs Immature Granulocytes: 0.02 K/uL (ref 0.00–0.07)
Basophils Absolute: 0.1 K/uL (ref 0.0–0.1)
Basophils Relative: 2 %
Eosinophils Absolute: 0.5 K/uL (ref 0.0–0.5)
Eosinophils Relative: 8 %
HCT: 21.7 % — ABNORMAL LOW (ref 36.0–46.0)
Hemoglobin: 7.5 g/dL — ABNORMAL LOW (ref 12.0–15.0)
Immature Granulocytes: 0 %
Lymphocytes Relative: 3 %
Lymphs Abs: 0.2 K/uL — ABNORMAL LOW (ref 0.7–4.0)
MCH: 29 pg (ref 26.0–34.0)
MCHC: 34.6 g/dL (ref 30.0–36.0)
MCV: 83.8 fL (ref 80.0–100.0)
Monocytes Absolute: 0.5 K/uL (ref 0.1–1.0)
Monocytes Relative: 8 %
Neutro Abs: 4.8 K/uL (ref 1.7–7.7)
Neutrophils Relative %: 79 %
Platelet Count: 203 K/uL (ref 150–400)
RBC: 2.59 MIL/uL — ABNORMAL LOW (ref 3.87–5.11)
RDW: 33.8 % — ABNORMAL HIGH (ref 11.5–15.5)
Smear Review: NORMAL
WBC Count: 6.1 K/uL (ref 4.0–10.5)
nRBC: 0 % (ref 0.0–0.2)

## 2024-05-20 LAB — CMP (CANCER CENTER ONLY)
ALT: 104 U/L — ABNORMAL HIGH (ref 0–44)
AST: 180 U/L (ref 15–41)
Albumin: 3.5 g/dL (ref 3.5–5.0)
Alkaline Phosphatase: 108 U/L (ref 38–126)
Anion gap: 8 (ref 5–15)
BUN: 7 mg/dL (ref 6–20)
CO2: 21 mmol/L — ABNORMAL LOW (ref 22–32)
Calcium: 9 mg/dL (ref 8.9–10.3)
Chloride: 105 mmol/L (ref 98–111)
Creatinine: 0.42 mg/dL — ABNORMAL LOW (ref 0.44–1.00)
GFR, Estimated: >60 mL/min (ref >60)
Glucose, Bld: 101 mg/dL — ABNORMAL HIGH (ref 70–99)
Potassium: 4.2 mmol/L (ref 3.5–5.1)
Sodium: 134 mmol/L — ABNORMAL LOW (ref 135–145)
Total Bilirubin: 1.4 mg/dL — ABNORMAL HIGH (ref 0.0–1.2)
Total Protein: 7.6 g/dL (ref 6.5–8.1)

## 2024-05-20 NOTE — Progress Notes (Signed)
[facility]  ONCOLOGY CLINIC PROGRESS NOTE   Patient Care Team: [name], MD as PCP - General (Oncology) Pickenpack-Cousar, Fannie SAILOR, NP as Nurse Practitioner (Hospice and Palliative Medicine) Silvano Valrie SQUIBB, RN as Registered Nurse  PATIENT NAME: Christina Gutierrez   MR#: [id number] DOB: [birth year]  Date of visit: 05/20/2024   ASSESSMENT & PLAN:   Veronica Fretz is a 48 y.o. Hispanic lady with no significant past medical history was hospitalized on 08/21/2023 after she presented with worsening right-sided abdominal pain, nausea, vomiting, decreased oral intake.  Workup including MRI of the abdomen showed evidence of multifocal hepatocellular carcinoma.  She was diagnosed with hepatitis B infection during this hospital admission.   Hepatocellular carcinoma (HCC) Please review oncology history for additional details and timeline of events.  MRI of the abdomen with liver protocol on 08/21/2023 showed numerous hepatic lesions are noted, with a dominant lesion which replaces much of the right lobe of the liver estimated to measure approximately 16.5 x 12.7 x 17.7 cm. This lesion is heterogeneous in signal intensity on T1 and T2 weighted images, but clearly has internal areas of hypervascular enhancement on early phase post gadolinium imaging, with persistent low-level enhancement on more delayed imaging. The other dominant lesion is centered in the superior aspect of the liver, likely within the superior aspect of segment 4A estimated to measure approximately 8.1 x 6.8 x 7.8 cm, with similar imaging characteristics to the previously described lesion, although with greater degree of internal washout and probable pseudo capsule on delayed imaging. Multiple other smaller hypervascular lesions are apparent on arterial phase imaging throughout all aspects of the liver, some of which demonstrate delayed washout.   MRI picture was indicative of multifocal  hepatocellular carcinoma.  This was confirmed during GI tumor conference on 09/09/2023.  -Previously undiagnosed hepatitis B could have been the contributing factor. -Not a candidate for liver transplant or surgical resection because of the extent of disease. - Previously I discussed diagnosis, prognosis, plan of care, treatment options.  Reviewed NCCN guidelines.   On 08/21/2023, AFP was significantly elevated at 32,160 ng/mL.     CT chest on 08/23/2023 showed no evidence of intrathoracic metastatic disease.  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  Plan made for systemic treatments with atezolizumab  plus bevacizumab .  Started this from 09/11/2023.  She has been tolerating treatments well without any major side effects.  Restaging MRI of the liver on 12/14/2023 showed stable disease without evidence of disease progression or new disease.  She had CT abdomen and pelvis on 02/12/2024 when she was hospitalized and it also showed stable disease.  Plan was to continue current management.  However given progressive gum bleeding, we started holding bevacizumab  to see if that would help improve her symptoms.  Last dose of Avastin  was on 12/25/2023.  She has since established with a dentist and has periodontal disease that is causing gum bleeding.  It is slowly improving.  Last dose of atezolizumab  was on 02/26/2024.  After her last dose, patient developed significant fatigue, decreased appetite and inability to eat.  Also has been having progressive abdominal pain which needed multiple ED visits.  CT abdomen and pelvis was obtained on 03/06/2024 which showed signs of partial small bowel obstruction.  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  Though radiologically her disease is stable, clinically she is progressing.  Patient does not want  to continue with atezolizumab  as she is afraid of worsening  fatigue, failure to thrive.  Her case was discussed in GI tumor conference.  Plan made to proceed with palliative radiation and also IR guided ablation to help with her pain.  Following completion of palliative radiation, she had only minimal response.  Though repeat imaging showed overall stable liver lesion, clinically she continued to progress.  AFP continued to increase and it was more than 70,000 as of 05/20/2024.  Proposed switching treatments to ramucirumab or lenvatinib.  Patient wants to defer treatments at this time and plans to go back to home country of [place].  She will inform us  if she changes her mind.  Will continue supportive care for now.  She is currently receiving IV fluids on Mondays and Thursdays at her infusion center on [address] and this has helped her significantly and has improved quality of life.  - Discussed hospice care as an option for symptom control if chemotherapy is not pursued.  She verbalized understanding.  Anemia in neoplastic disease Anemia with hemoglobin level at 8.2, not requiring blood transfusion.  Abnormal liver function tests Abnormal liver function tests showing improvement compared to three weeks ago, though still elevated.  Gastroesophageal reflux disease GERD managed with pantoprazole  - Send prescription for pantoprazole  to community pharmacy  I reviewed lab results and outside records for this visit and discussed relevant results with the patient. Diagnosis, plan of care and treatment options were also discussed in detail with the patient. Opportunity provided to ask questions and answers provided to her apparent satisfaction. Provided instructions to call our clinic with any problems, questions or concerns prior to return visit. I recommended to continue follow-up with PCP and sub-specialists. She verbalized understanding and agreed with the plan.   NCCN guidelines have been consulted in the planning of this patient's care.  I  spent a total of 45 minutes during this encounter with the patient including review of chart and various tests results, discussions about plan of care and coordination of care plan.  Chinita Patten, MD  05/20/2024 3:50 PM  [facility] - A DEPT OF [facility] [address] [place] [address] Dept: [phone] Dept Fax: [phone]    CHIEF COMPLAINT/ REASON FOR VISIT:   Multifocal hepatocellular carcinoma, diagnosed based on MRI findings.  Current Treatment: Systemic treatment with atezolizumab  plus bevacizumab  starting from 09/11/2023.  INTERVAL HISTORY:    Discussed the use of AI scribe software for clinical note transcription with the patient, who gave verbal consent to proceed.  History of Present Illness Mitchelle Sultan is a 48 year old female with liver cancer who presents with worsening symptoms and pain management issues. She is accompanied by Alejandra.  She has been experiencing significant symptoms related to her liver cancer, including persistent stomach inflammation, vomiting, and pain. Despite undergoing radiation therapy, she has had minimal relief from her pain. Her liver function tests have shown a gradual increase in liver enzymes. Recent scans have shown that the cancer appears stable in size. She has had multiple scans since October, with the most recent one last week, all showing stability in tumor size.  Her alpha-fetoprotein (AFP) levels, a marker for liver cancer, have been fluctuating. Initially recorded at 32,000 in February, they peaked at 47,000 in May, decreased to 33,000, and have recently risen again to 39,000.  She feels very weak, fatigued, and lacking energy, which has significantly impacted  her quality of life. She has not been able to tolerate previous treatments well, experiencing severe side effects that have contributed to her current state of fatigue and weakness.  She  wants to travel to [place], her home country, but feels too weak to do so at this time.    I have reviewed the past medical history, past surgical history, social history and family history with the patient and they are unchanged from previous note.  HISTORY OF PRESENT ILLNESS:   ONCOLOGY HISTORY:   48 y.o. Hispanic lady with no significant past medical history, presented to the emergency department on 08/21/2023 with complaints of right-sided abdominal pain, nausea, vomiting and decreased oral intake for at least 10 days prior to arrival.   On arrival to ED, she was found to have anemia with hemoglobin of 8, white count normal at 6100, platelet count 316,000.  CMP showed elevated AST of 80, ALT increased at 79, bilirubin elevated at 1.8.  Elevated ammonia level of 62.   Right upper quadrant ultrasound showed large heterogenoussolid mass within the liver measuring up to 16.9 cm, suspicious for malignancy. Recommend further evaluation with contrast-enhanced CT or MRI.   CT abdomen pelvis showed: 15.5 x 12.5 cm solid well-circumscribed mass within the right hepatic lobe. Areas of central low-density suggest possibility of central scar. There may be a large feeding vessel. Favor focal nodular hyperplasia although fibrolamellar HCC can have a similar appearance. Recommend further evaluation MRI without and with contrast.   Left spontaneous splenorenal shunt suggest portal venous hypertension. No evidence for cirrhosis. This may be related to mass effect and compression of the portal vein from the large mass.   She was recently diagnosed with chickenpox about 20 days prior to this hospitalization.  Still had some itching from vesicles.   She was admitted for further evaluation and management.   MRI of the abdomen with liver protocol on 08/21/2023 showed numerous hepatic lesions are noted, with a dominant lesion which replaces much of the right lobe of the liver estimated to measure approximately  16.5 x 12.7 x 17.7 cm. This lesion is heterogeneous in signal intensity on T1 and T2 weighted images, but clearly has internal areas of hypervascular enhancement on early phase post gadolinium imaging, with persistent low-level enhancement on more delayed imaging. The other dominant lesion is centered in the superior aspect of the liver, likely within the superior aspect of segment 4A estimated to measure approximately 8.1 x 6.8 x 7.8 cm, with similar imaging characteristics to the previously described lesion, although with greater degree of internal washout and probable pseudo capsule on delayed imaging. Multiple other smaller hypervascular lesions are apparent on arterial phase imaging throughout all aspects of the liver, some of which demonstrate delayed washout.   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   On 08/21/2023, AFP was significantly elevated at 32,160 ng/mL.     She was also diagnosed with hepatitis B infection on additional workup.   CT chest on 08/23/2023 showed no evidence of intrathoracic metastatic disease.   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  Her case was discussed in GI tumor conference on 09/09/2023.  Reviewed images with the team and confirmed multifocal hepatocellular carcinoma.   Plan made for systemic treatments with atezolizumab  plus bevacizumab .  Started from 09/11/2023.  Restaging MRI of the liver on 12/14/2023 showed stable disease without evidence of disease progression or new disease.  Plan to continue current management.  However given progressive gum bleeding, Avastin  was held  from 01/15/2024.  Because of progressive fatigue, decreased appetite and other symptoms, patient decided to take a break from immunotherapy.  Her last treatment was on 02/26/2024.  CT abdomen and pelvis was obtained on 03/06/2024 which showed signs of partial small bowel obstruction.  Multifocal hepatocellular carcinoma was stable in size overall but is now causing pressure  symptoms including marked narrowing of the suprarenal inferior vena cava.  Her case was discussed in GI tumor conference.  Plan made to proceed with palliative radiation and also IR guided ablation to help with her pain.  Following completion of palliative radiation, she had only minimal response.  Though repeat imaging showed overall stable liver lesion, clinically she continued to progress.  AFP continued to increase and it was more than 70,000 as of 05/20/2024.  Proposed switching treatments to ramucirumab or lenvatinib.  Patient wants to defer treatments at this time and plans to go back to home country of [place].  She will inform us  if she changes her mind.   Oncology History  Hepatocellular carcinoma (HCC)  08/22/2023 Initial Diagnosis   Hepatocellular carcinoma (HCC)   08/27/2023 Cancer Staging   Staging form: Liver, AJCC 8th Edition - Clinical: Stage IIIA (cT3, cN0, cM0) - Signed by [name], MD on 08/27/2023   09/11/2023 -  Chemotherapy   Patient is on Treatment Plan : HEPATOCELLULAR Atezolizumab  + Bevacizumab  q21d         REVIEW OF SYSTEMS:   Review of Systems - Oncology  All other pertinent systems were reviewed with the patient and are negative.  ALLERGIES: She has no known allergies.  MEDICATIONS:  Current Outpatient Medications  Medication Sig Dispense Refill   acetaminophen  (TYLENOL ) 325 MG tablet Take 2 tablets (650 mg total) by mouth every 6 (six) hours as needed for mild pain (pain score 1-3) or fever (or Fever >/= 101).     lidocaine -prilocaine  (EMLA ) cream Apply topically as needed. 30 g 3   morphine  (MS CONTIN ) 15 MG 12 hr tablet Take 1 tablet (15 mg total) by mouth every 12 (twelve) hours. 60 tablet 0   morphine  (MSIR) 15 MG tablet Take 1 tablet (15 mg total) by mouth every 3 (three) hours as needed for severe pain (pain score 7-10). 60 tablet 0   ondansetron  (ZOFRAN ) 8 MG tablet Take 1 tablet (8 mg total) by mouth every 8 (eight) hours as needed for  nausea or vomiting. 60 tablet 0   polyethylene glycol powder (GLYCOLAX /MIRALAX ) 17 GM/SCOOP powder Take 17 g by mouth daily. Dissolve 1 capful (17g) in 4-8 ounces of liquid and take by mouth daily. 238 g 0   prochlorperazine  (COMPAZINE ) 10 MG tablet Take 1 tablet (10 mg total) by mouth every 6 (six) hours as needed for nausea or vomiting. 30 tablet 1   promethazine (PHENERGAN) 12.5 MG tablet Take 1 tablet (12.5 mg total) by mouth every 6 (six) hours as needed for nausea or vomiting. 20 tablet 0   senna-docusate (SENOKOT-S) 8.6-50 MG tablet Take 1 tablet by mouth 2 (two) times daily. 180 tablet 0   tenofovir  alafenamide (VEMLIDY ) 25 MG tablet Take 1 tablet (25 mg total) by mouth daily. 30 tablet 5   No current facility-administered medications for this visit.     VITALS:   Blood pressure (!) 90/56, pulse 84, temperature (!) 100.4 F (38 C), temperature source Temporal, resp. rate 17, height 5' (1.524 m), weight 104 lb 12.8 oz (47.5 kg), SpO2 97%.  Wt Readings from Last 3 Encounters:  05/20/24 104  lb 12.8 oz (47.5 kg)  05/19/24 102 lb (46.3 kg)  05/15/24 100 lb 12 oz (45.7 kg)    Body mass index is 20.47 kg/m.    Onc Performance Status - 05/20/24 1500       ECOG Perf Status   ECOG Perf Status Restricted in physically strenuous activity but ambulatory and able to carry out work of a light or sedentary nature, e.g., light house work, office work      KPS SCALE   KPS % SCORE Able to carry on normal activity, minor s/s of disease             PHYSICAL EXAM:   Physical Exam Constitutional:      General: She is not in acute distress.    Comments: Progressively getting thinner, cachectic  HENT:     Head: Normocephalic and atraumatic.  Eyes:     General: No scleral icterus.    Conjunctiva/sclera: Conjunctivae normal.  Cardiovascular:     Rate and Rhythm: Normal rate and regular rhythm.     Heart sounds: Normal heart sounds.  Pulmonary:     Effort: Pulmonary effort is  normal.     Breath sounds: Normal breath sounds.  Abdominal:     Palpations: There is mass (stable hepatomegaly).  Musculoskeletal:     Right lower leg: No edema.     Left lower leg: No edema.  Neurological:     General: No focal deficit present.     Mental Status: She is alert.  Psychiatric:        Mood and Affect: Mood normal.        Behavior: Behavior normal.      LABORATORY DATA:   I have reviewed the data as listed.  Results for orders placed or performed in visit on 05/20/24  CBC with Differential (Cancer Center Only)  Result Value Ref Range   WBC Count 6.1 4.0 - 10.5 K/uL   RBC 2.59 (L) 3.87 - 5.11 MIL/uL   Hemoglobin 7.5 (L) 12.0 - 15.0 g/dL   HCT 78.2 (L) 63.9 - 53.9 %   MCV 83.8 80.0 - 100.0 fL   MCH 29.0 26.0 - 34.0 pg   MCHC 34.6 30.0 - 36.0 g/dL   RDW 66.1 (H) 88.4 - 84.4 %   Platelet Count 203 150 - 400 K/uL   nRBC 0.0 0.0 - 0.2 %   Neutrophils Relative % 79 %   Neutro Abs 4.8 1.7 - 7.7 K/uL   Lymphocytes Relative 3 %   Lymphs Abs 0.2 (L) 0.7 - 4.0 K/uL   Monocytes Relative 8 %   Monocytes Absolute 0.5 0.1 - 1.0 K/uL   Eosinophils Relative 8 %   Eosinophils Absolute 0.5 0.0 - 0.5 K/uL   Basophils Relative 2 %   Basophils Absolute 0.1 0.0 - 0.1 K/uL   WBC Morphology MORPHOLOGY UNREMARKABLE    Smear Review Normal platelet morphology    Immature Granulocytes 0 %   Abs Immature Granulocytes 0.02 0.00 - 0.07 K/uL   Target Cells PRESENT   CMP (Cancer Center only)  Result Value Ref Range   Sodium 134 (L) 135 - 145 mmol/L   Potassium 4.2 3.5 - 5.1 mmol/L   Chloride 105 98 - 111 mmol/L   CO2 21 (L) 22 - 32 mmol/L   Glucose, Bld 101 (H) 70 - 99 mg/dL   BUN 7 6 - 20 mg/dL   Creatinine 9.57 (L) 9.55 - 1.00 mg/dL   Calcium 9.0 8.9 - 10.3  mg/dL   Total Protein 7.6 6.5 - 8.1 g/dL   Albumin 3.5 3.5 - 5.0 g/dL   AST 819 (HH) 15 - 41 U/L   ALT 104 (H) 0 - 44 U/L   Alkaline Phosphatase 108 38 - 126 U/L   Total Bilirubin 1.4 (H) 0.0 - 1.2 mg/dL   GFR,  Estimated >39 >39 mL/min   Anion gap 8 5 - 15  AFP tumor marker  Result Value Ref Range   AFP, Serum, Tumor Marker 72,646.0 (H) 0.0 - 6.4 ng/mL      RADIOGRAPHIC STUDIES:  Reviewed MRI from 12/14/2023 which showed stable disease.  CT ABDOMEN PELVIS W CONTRAST CLINICAL DATA:  Unable to eat abdominal pain  EXAM: CT ABDOMEN AND PELVIS WITH CONTRAST  TECHNIQUE: Multidetector CT imaging of the abdomen and pelvis was performed using the standard protocol following bolus administration of intravenous contrast.  RADIATION DOSE REDUCTION: This exam was performed according to the departmental dose-optimization program which includes automated exposure control, adjustment of the mA and/or kV according to patient size and/or use of iterative reconstruction technique.  CONTRAST:  80mL OMNIPAQUE  IOHEXOL  300 MG/ML  SOLN  COMPARISON:  CT 04/30/2024, and multiple prior exams dating back to 08/21/2023  FINDINGS: Lower chest: Lung bases demonstrate no acute airspace disease. Central venous catheter tip visualized in the right atrium.  Hepatobiliary: Liver enlargement with multiple hepatic masses including dominant right hepatic mass measuring about 15.5 x 18.4 x 15.7 cm, grossly similar in the short interval. Gallstones.  Pancreas: Displaced to the left hemiabdomen. No inflammation or ductal dilatation.  Spleen: Normal in size without focal abnormality.  Adrenals/Urinary Tract: Right adrenal gland poorly visible. Left adrenal gland is normal. Cyst in the right kidney for which no imaging follow-up is recommended. No hydronephrosis. The bladder is unremarkable  Stomach/Bowel: Stomach and duodenum displaced to the left with mass effect from large hepatic masses. No dilated small bowel. No acute bowel wall thickening.  Vascular/Lymphatic: Nonaneurysmal aorta. Mass effect on the IVC, hepatic veins and portal vessels secondary to large liver masses but no direct evidence for a  thrombus. Splenic vein is also patent. Numerous varices and collateral vessels are again noted.  Reproductive: Uterus and bilateral adnexa are unremarkable.  Other: No free air.  Small volume free fluid in the pelvis  Musculoskeletal: No acute osseous abnormality  IMPRESSION: 1. No CT evidence for acute interval change compared to prior exam. 2. Hepatomegaly with multiple hepatic masses, grossly similar in the short interval corresponding to history of known malignancy. Mass effect on the IVC, hepatic veins and portal vessels but no visualized thrombus or occlusion. Mass effect on the stomach, duodenum, and pancreas which are displaced to the left hemiabdomen as before but no obstructive features. 3. Small volume free fluid in the pelvis. 4. Gallstones.  Electronically Signed   By: Luke Bun M.D.   On: 05/12/2024 15:30    CODE STATUS:  Code Status History     Date Active Date Inactive Code Status Order ID Comments User Context   08/29/2023 0409 08/31/2023 1925 Full Code [id number]  Shona Terry SAILOR, DO ED   08/21/2023 0400 08/23/2023 2127 Full Code [id number]  Lee Kingfisher, MD ED    Questions for Most Recent Historical Code Status (Order [id number])     Question Answer   By: Consent: discussion documented in EHR           Future Appointments  Date Time Provider Department Center  05/23/2024 11:30 AM [facility] None  05/26/2024 11:30 AM [facility] 8 [facility] None  05/30/2024 11:30 AM [facility] 6 [facility] None  06/01/2024 11:30 AM [facility] None  06/06/2024  2:45 PM [facility] None  06/07/2024  1:45 PM Fleeta Rothman, Jomarie SAILOR, MD [facility]     This document was completed utilizing speech recognition software. Grammatical errors, random word insertions, pronoun errors, and incomplete sentences are an occasional consequence of this system due to software limitations, ambient noise, and hardware issues. Any formal  questions or concerns about the content, text or information contained within the body of this dictation should be directly addressed to the provider for clarification.

## 2024-05-20 NOTE — Assessment & Plan Note (Addendum)
 Please review oncology history for additional details and timeline of events.  MRI of the abdomen with liver protocol on 08/21/2023 showed numerous hepatic lesions are noted, with a dominant lesion which replaces much of the right lobe of the liver estimated to measure approximately 16.5 x 12.7 x 17.7 cm. This lesion is heterogeneous in signal intensity on T1 and T2 weighted images, but clearly has internal areas of hypervascular enhancement on early phase post gadolinium imaging, with persistent low-level enhancement on more delayed imaging. The other dominant lesion is centered in the superior aspect of the liver, likely within the superior aspect of segment 4A estimated to measure approximately 8.1 x 6.8 x 7.8 cm, with similar imaging characteristics to the previously described lesion, although with greater degree of internal washout and probable pseudo capsule on delayed imaging. Multiple other smaller hypervascular lesions are apparent on arterial phase imaging throughout all aspects of the liver, some of which demonstrate delayed washout.   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  This was confirmed during GI tumor conference on 09/09/2023.  -Previously undiagnosed hepatitis B could have been the contributing factor. -Not a candidate for liver transplant or surgical resection because of the extent of disease. - Previously I discussed diagnosis, prognosis, plan of care, treatment options.  Reviewed NCCN guidelines.   On 08/21/2023, AFP was significantly elevated at 32,160 ng/mL.     CT chest on 08/23/2023 showed no evidence of intrathoracic metastatic disease.  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  Plan made for systemic treatments with atezolizumab  plus bevacizumab .  Started this from 09/11/2023.  She has been tolerating treatments well without any major side effects.  Restaging MRI of the liver on 12/14/2023 showed stable disease without evidence of disease progression or new  disease.  She had CT abdomen and pelvis on 02/12/2024 when she was hospitalized and it also showed stable disease.  Plan was to continue current management.  However given progressive gum bleeding, we started holding bevacizumab  to see if that would help improve her symptoms.  Last dose of Avastin  was on 12/25/2023.  She has since established with a dentist and has periodontal disease that is causing gum bleeding.  It is slowly improving.  Last dose of atezolizumab  was on 02/26/2024.  After her last dose, patient developed significant fatigue, decreased appetite and inability to eat.  Also has been having progressive abdominal pain which needed multiple ED visits.  CT abdomen and pelvis was obtained on 03/06/2024 which showed signs of partial small bowel obstruction.  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  Though radiologically her disease is stable, clinically she is progressing.  Patient does not want to continue with atezolizumab  as she is afraid of worsening fatigue, failure to thrive.  Her case was discussed in GI tumor conference.  Plan made to proceed with palliative radiation and also IR guided ablation to help with her pain.  Following completion of palliative radiation, she had only minimal response.  Though repeat imaging showed overall stable liver lesion, clinically she continued to progress.  AFP continued to increase and it was more than 70,000 as of 05/20/2024.  Proposed switching treatments to ramucirumab or lenvatinib.  Patient wants to defer treatments at this time and plans to go back to home country of [place].  She will inform us  if she changes her mind.  Will continue supportive care for now.  She is  currently receiving IV fluids on Mondays and Thursdays at her infusion center on [address] and this has helped her significantly and has improved  quality of life.  - Discussed hospice care as an option for symptom control if chemotherapy is not pursued.  She verbalized understanding.

## 2024-05-20 NOTE — Progress Notes (Signed)
 CRITICAL VALUE STICKER  CRITICAL VALUE: AST  180 RECEIVER (on-site recipient of call): Keene Crown, RN DATE & TIME NOTIFIED:  05/20/2024, 1559 MESSENGER (representative from lab): Amber MD NOTIFIED:  Dr. Autumn TIME OF NOTIFICATION: 1601 RESPONSE:  Aware

## 2024-05-21 ENCOUNTER — Other Ambulatory Visit: Payer: Self-pay

## 2024-05-21 ENCOUNTER — Emergency Department (HOSPITAL_COMMUNITY)
Admission: EM | Admit: 2024-05-21 | Discharge: 2024-05-21 | Disposition: A | Discharge status: Home / Self Care | Payer: Self-pay | Attending: Emergency Medicine | Admitting: Emergency Medicine

## 2024-05-21 ENCOUNTER — Encounter (HOSPITAL_COMMUNITY): Payer: Self-pay

## 2024-05-21 DIAGNOSIS — Z8505 Personal history of malignant neoplasm of liver: Secondary | ICD-10-CM | POA: Insufficient documentation

## 2024-05-21 DIAGNOSIS — R040 Epistaxis: Secondary | ICD-10-CM | POA: Insufficient documentation

## 2024-05-21 LAB — CBC WITH DIFFERENTIAL/PLATELET
Basophils Absolute: 0.1 K/uL (ref 0.0–0.1)
Basophils Relative: 2 %
Eosinophils Absolute: 0.4 K/uL (ref 0.0–0.5)
Eosinophils Relative: 8 %
HCT: 21.8 % — ABNORMAL LOW (ref 36.0–46.0)
Hemoglobin: 7.5 g/dL — ABNORMAL LOW (ref 12.0–15.0)
Lymphocytes Relative: 4 %
Lymphs Abs: 0.2 K/uL — ABNORMAL LOW (ref 0.7–4.0)
MCH: 29.5 pg (ref 26.0–34.0)
MCHC: 34.4 g/dL (ref 30.0–36.0)
MCV: 85.8 fL (ref 80.0–100.0)
Monocytes Absolute: 0.4 K/uL (ref 0.1–1.0)
Monocytes Relative: 8 %
Neutro Abs: 4.5 K/uL (ref 1.7–7.7)
Neutrophils Relative %: 78 %
Platelets: 205 K/uL (ref 150–400)
RBC: 2.54 MIL/uL — ABNORMAL LOW (ref 3.87–5.11)
RDW: 33.2 % — ABNORMAL HIGH (ref 11.5–15.5)
WBC: 5.6 K/uL (ref 4.0–10.5)
nRBC: 0 % (ref 0.0–0.2)

## 2024-05-21 LAB — COMPREHENSIVE METABOLIC PANEL WITH GFR
ALT: 125 U/L — ABNORMAL HIGH (ref 0–44)
AST: 222 U/L — ABNORMAL HIGH (ref 15–41)
Albumin: 3.5 g/dL (ref 3.5–5.0)
Alkaline Phosphatase: 142 U/L — ABNORMAL HIGH (ref 38–126)
Anion gap: 10 (ref 5–15)
BUN: 7 mg/dL (ref 6–20)
CO2: 21 mmol/L — ABNORMAL LOW (ref 22–32)
Calcium: 9.3 mg/dL (ref 8.9–10.3)
Chloride: 101 mmol/L (ref 98–111)
Creatinine, Ser: 0.45 mg/dL (ref 0.44–1.00)
GFR, Estimated: >60 mL/min (ref >60)
Glucose, Bld: 118 mg/dL — ABNORMAL HIGH (ref 70–99)
Potassium: 3.9 mmol/L (ref 3.5–5.1)
Sodium: 132 mmol/L — ABNORMAL LOW (ref 135–145)
Total Bilirubin: 1 mg/dL (ref 0.0–1.2)
Total Protein: 7.7 g/dL (ref 6.5–8.1)

## 2024-05-21 LAB — AFP TUMOR MARKER: AFP, Serum, Tumor Marker: 72646 ng/mL — ABNORMAL HIGH (ref 0.0–6.4)

## 2024-05-21 MED ORDER — OXYMETAZOLINE HCL 0.05 % NA SOLN
1.0000 | Freq: Once | NASAL | Status: AC
  Administered 2024-05-21: 1 via NASAL
  Filled 2024-05-21: qty 30

## 2024-05-21 MED ORDER — MORPHINE SULFATE ER 15 MG PO TBCR
15.0000 mg | EXTENDED_RELEASE_TABLET | Freq: Two times a day (BID) | ORAL | Status: DC
  Administered 2024-05-21: 15 mg via ORAL
  Filled 2024-05-21: qty 1

## 2024-05-21 NOTE — ED Provider Notes (Signed)
 [facility] EMERGENCY DEPARTMENT AT [facility] Provider Note   CSN: [id number] Arrival date & time: 05/21/24  1856     Patient presents with: Epistaxis   Christina Gutierrez is a 48 y.o. female with a past medical history of hepatocellular carcinoma (not on chemotherapy but obtaining palliative radiation for chronic pain), anemia of chronic disease presents to emergency department for evaluation of right-sided epistaxis that started 3 hours ago when sitting at home.  She reports she used 2 rags and 1 short to clean this up.  Epistaxis lasted for 1 hour long.  Denies dizziness, lightheadedness, traumatic injury, thinner, nor complaints prior to epistaxis    The history is provided by the patient. The history is limited by a language barrier. A language interpreter was used Joye [phone]).  Epistaxis      Prior to Admission medications   Medication Sig Start Date End Date Taking? Authorizing Provider  acetaminophen  (TYLENOL ) 325 MG tablet Take 2 tablets (650 mg total) by mouth every 6 (six) hours as needed for mild pain (pain score 1-3) or fever (or Fever >/= 101). 05/16/24   Barbarann Nest, MD  lidocaine -prilocaine  (EMLA ) cream Apply topically as needed. 05/13/24   [place], [name] PARAS, DO  morphine  (MS CONTIN ) 15 MG 12 hr tablet Take 1 tablet (15 mg total) by mouth every 12 (twelve) hours. 05/13/24   [place], [name] PARAS, DO  morphine  (MSIR) 15 MG tablet Take 1 tablet (15 mg total) by mouth every 3 (three) hours as needed for severe pain (pain score 7-10). 05/13/24   [place], [name] PARAS, DO  ondansetron  (ZOFRAN ) 8 MG tablet Take 1 tablet (8 mg total) by mouth every 8 (eight) hours as needed for nausea or vomiting. 05/13/24   [place], [name] PARAS, DO  polyethylene glycol powder (GLYCOLAX /MIRALAX ) 17 GM/SCOOP powder Take 17 g by mouth daily. Dissolve 1 capful (17g) in 4-8 ounces of liquid and take by mouth daily. 05/16/24   Barbarann Nest, MD  prochlorperazine  (COMPAZINE ) 10 MG tablet Take 1  tablet (10 mg total) by mouth every 6 (six) hours as needed for nausea or vomiting. 04/29/24   Pasam, Avinash, MD  promethazine (PHENERGAN) 12.5 MG tablet Take 1 tablet (12.5 mg total) by mouth every 6 (six) hours as needed for nausea or vomiting. 04/30/24   Garrick Charleston, MD  senna-docusate (SENOKOT-S) 8.6-50 MG tablet Take 1 tablet by mouth 2 (two) times daily. 05/16/24 08/14/24  Barbarann Nest, MD  tenofovir  alafenamide (VEMLIDY ) 25 MG tablet Take 1 tablet (25 mg total) by mouth daily. 05/17/24   Fleeta Kathie Jomarie LOISE, MD    Allergies: Patient has no known allergies.    Review of Systems  HENT:  Positive for nosebleeds.     Updated Vital Signs BP 105/66 (BP Location: Left Arm)   Pulse 88   Temp 98.1 F (36.7 C) (Oral)   Resp 16   Ht 5' (1.524 m)   Wt 47.2 kg   LMP 06/21/2023 (Approximate)   SpO2 98%   BMI 20.31 kg/m   Physical Exam Vitals and nursing note reviewed.  Constitutional:      General: She is not in acute distress.    Appearance: Normal appearance. She is not ill-appearing.  HENT:     Head: Normocephalic and atraumatic.     Comments: Dried blood to right nostril.  No septal hematoma bilaterally.    Mouth/Throat:     Mouth: Mucous membranes are moist.     Pharynx: No oropharyngeal exudate or posterior  oropharyngeal erythema.  Eyes:     General: No scleral icterus.       Right eye: No discharge.        Left eye: No discharge.     Conjunctiva/sclera: Conjunctivae normal.  Cardiovascular:     Rate and Rhythm: Normal rate.     Pulses: Normal pulses.  Pulmonary:     Effort: Pulmonary effort is normal. No respiratory distress.     Breath sounds: Normal breath sounds. No stridor. No wheezing or rhonchi.  Chest:     Chest wall: No tenderness.  Abdominal:     General: There is no distension.     Palpations: Abdomen is soft. There is no mass.     Tenderness: There is no abdominal tenderness. There is no guarding.  Musculoskeletal:     Cervical back: Normal  range of motion and neck supple. No rigidity or tenderness.  Lymphadenopathy:     Cervical: No cervical adenopathy.  Skin:    General: Skin is warm.     Capillary Refill: Capillary refill takes less than 2 seconds.     Coloration: Skin is not jaundiced or pale.  Neurological:     Mental Status: She is alert. Mental status is at baseline.     (all labs ordered are listed, but only abnormal results are displayed) Labs Reviewed  CBC WITH DIFFERENTIAL/PLATELET - Abnormal; Notable for the following components:      Result Value   RBC 2.54 (*)    Hemoglobin 7.5 (*)    HCT 21.8 (*)    RDW 33.2 (*)    All other components within normal limits  COMPREHENSIVE METABOLIC PANEL WITH GFR - Abnormal; Notable for the following components:   Sodium 132 (*)    CO2 21 (*)    Glucose, Bld 118 (*)    AST 222 (*)    ALT 125 (*)    Alkaline Phosphatase 142 (*)    All other components within normal limits    EKG: None  Radiology: No results found.  Medications Ordered in the ED  morphine  (MS CONTIN ) 12 hr tablet 15 mg (15 mg Oral Given 05/21/24 2218)  oxymetazoline (AFRIN) 0.05 % nasal spray 1 spray (1 spray Each Nare Given 05/21/24 2145)                                    Medical Decision Making Amount and/or Complexity of Data Reviewed Labs: ordered.  Risk OTC drugs. Prescription drug management.   Patient presents to the ED for concern of epistaxis, this involves an extensive number of treatment options, and is a complaint that carries with it a high risk of complications and morbidity.  The differential diagnosis includes symptomatic anemia, intractable epistaxis   Co morbidities that complicate the patient evaluation  See HPI   Additional history obtained:  Additional history obtained from Nursing and Outside Medical Records   External records from outside source obtained and reviewed including triage RN note, previous lab work   Lab Tests:  I Ordered, and  personally interpreted labs.  The pertinent results include:   Hgb 7.5 at her baseline (7.5-8.8 over past three weeks) Na 132 CBG 118 AST 222 ALT 125 ALP 142    Medicines ordered and prescription drug management:  I ordered medication including afrin  for epistaxis  Reevaluation of the patient after these medicines showed that the patient resolved I have reviewed the  patients home medicines and have made adjustments as needed     Problem List / ED Course:  Epistaxis On assessment, epistaxis is controlled however patient continues to blow her nose causing it to reoccur. Epistaxis is minimal oozing with a medium sized clot  Hemodynamically stable with no tachycardia nor hypotension. Hgb 7.5 at her baseline (7.5-8.8 over past three weeks) No septal hematoma. Denies traumatic injury Resolution of epistaxis following afrin. Had patient ambulate in room and observed her an hour and a half following afrin administration with no reoccurrence of epistaxis  Hepatocellular cancer RUQ abd pain unchanged from previous cancer related abd pain per patient. No NV currently CMP unchanged from her baseline. CBC per her baseline at 5.6 proceed with palliative radiation and also IR guided ablation to help with her pain per oncology. No chemo currently Did not take her prescribed morphine  tonight. Provided her prescribed dose of morphine  Has f/u with oncology next week   Reevaluation:  After the interventions noted above, I reevaluated the patient and found that they have :improved     Dispostion:  After consideration of the diagnostic results and the patients response to treatment, I feel that the patent would benefit from outpatient management with oncology follow-up.   Discussed ED workup, disposition, return to ED precautions with patient who expresses understanding agrees with plan.  All questions answered to their satisfaction.  They are agreeable to plan.  Discharge instructions  provided on paperwork  Final diagnoses:  Right-sided epistaxis    ED Discharge Orders     None        Minnie Tinnie BRAVO, PA 05/21/24 2322    Freddi Hamilton, MD 05/22/24 2250

## 2024-05-21 NOTE — ED Notes (Signed)
 Pt refused discharge vitals

## 2024-05-21 NOTE — ED Triage Notes (Addendum)
 Triage done using ipad spanish interpreter: pt reports nose bleed that started 30 mins ago (1845). Denies taking blood thinners, denies injury. She has liver cancer but has not underwent any treatment. She reports abd pain where my liver is.  It appears as though the bleeding has stopped at this time.

## 2024-05-21 NOTE — Discharge Instructions (Signed)
 Gracias por permitirnos evaluarle hoy. Hemos detenido su hemorragia nasal con Afrin. Si tiene una hemorragia nasal en casa, asegrese de sonarse la nariz completamente y Investment Banker, Operational en aerosol nasal. Inclnese hacia adelante y presione firmemente durante 25 minutos, sin mirar la clinical cytogeneticist, para asegurar un control adecuado del sangrado. Evite que la sangre baje por la garganta, ya que podra provocarle nuseas o vmitos. Por favor, acuda a su cita de seguimiento con el onclogo la prxima semana, segn lo programado.  Regrese al servicio de urgencias si presenta vmitos incontrolables que le impiden retener lquidos, o si la hemorragia nasal no se detiene en 20 minutos, especialmente si tiene mareos, aturdimiento o empeoramiento de los sntomas

## 2024-05-23 ENCOUNTER — Ambulatory Visit: Payer: Self-pay

## 2024-05-23 VITALS — BP 91/57 | HR 81 | Temp 98.8°F | Resp 18 | Ht 60.0 in | Wt 102.6 lb

## 2024-05-23 DIAGNOSIS — E860 Dehydration: Secondary | ICD-10-CM

## 2024-05-23 DIAGNOSIS — C220 Liver cell carcinoma: Secondary | ICD-10-CM

## 2024-05-23 MED ORDER — ONDANSETRON HCL 4 MG/2ML IJ SOLN
8.0000 mg | Freq: Once | INTRAMUSCULAR | Status: AC
  Administered 2024-05-23: 8 mg via INTRAVENOUS
  Filled 2024-05-23: qty 4

## 2024-05-23 MED ORDER — SODIUM CHLORIDE 0.9 % IV BOLUS
1000.0000 mL | Freq: Once | INTRAVENOUS | Status: AC
  Administered 2024-05-23: 1000 mL via INTRAVENOUS
  Filled 2024-05-23: qty 1000

## 2024-05-23 NOTE — Progress Notes (Signed)
 Diagnosis: Dehydration  Provider:  Praveen Mannam MD  Procedure: IV Infusion  IV Type: Peripheral, IV Location: L Hand  Normal Saline, Dose: 1000 ml  Infusion Start Time: 1154  Infusion Stop Time: 1400  Post Infusion IV Care: Peripheral IV Discontinued  Discharge: Condition: Good, Destination: Home . AVS Declined  Performed by:  Seven Marengo, RN

## 2024-05-24 ENCOUNTER — Encounter: Payer: Self-pay | Admitting: Oncology

## 2024-05-24 ENCOUNTER — Encounter (HOSPITAL_COMMUNITY): Payer: Self-pay | Admitting: Oncology

## 2024-05-25 ENCOUNTER — Other Ambulatory Visit: Payer: Self-pay

## 2024-05-25 ENCOUNTER — Other Ambulatory Visit: Payer: Self-pay | Admitting: Nurse Practitioner

## 2024-05-25 NOTE — Progress Notes (Signed)
 Specialty Pharmacy Ongoing Clinical Assessment Note  Kelee Cunningham is a 48 y.o. female who is being followed by the specialty pharmacy service for RxSp Hepatitis B   Patient's specialty medication(s) reviewed today: Tenofovir  Alafenamide Fumarate (Vemlidy )   Missed doses in the last 4 weeks: 0   Patient/Caregiver did not have any additional questions or concerns.   Therapeutic benefit summary: Unable to assess   Adverse events/side effects summary: No adverse events/side effects   Patient's therapy is appropriate to: Continue    Goals Addressed             This Visit's Progress    Achieve sustained HBV viral load suppression       Patient is unable to be assessed until follow up appt 12/22. Patient will maintain adherence and adhere to provider and/or lab appointments       Comply with lab assessments   On track    Patient is on track. Patient will adhere to provider and/or lab appointments      Maintain optimal adherence to therapy       Patient is on track. Patient will maintain adherence          Follow up: 12 months  Lessa Huge M Dyland Panuco Specialty Pharmacist

## 2024-05-25 NOTE — Progress Notes (Signed)
 Specialty Pharmacy Refill Coordination Note  Christina Gutierrez is a 48 y.o. female contacted today regarding refills of specialty medication(s) Tenofovir  Alafenamide Fumarate (Vemlidy )   Patient requested Pickup at [facility] [place] date: 05/26/24   Medication will be filled on: 05/25/24

## 2024-05-26 ENCOUNTER — Ambulatory Visit: Payer: Self-pay

## 2024-05-26 VITALS — BP 97/57 | HR 82 | Temp 98.9°F | Resp 14 | Ht 60.0 in | Wt 102.6 lb

## 2024-05-26 DIAGNOSIS — E860 Dehydration: Secondary | ICD-10-CM

## 2024-05-26 DIAGNOSIS — C220 Liver cell carcinoma: Secondary | ICD-10-CM

## 2024-05-26 MED ORDER — ONDANSETRON HCL 4 MG/2ML IJ SOLN
8.0000 mg | Freq: Once | INTRAMUSCULAR | Status: AC
  Administered 2024-05-26: 8 mg via INTRAVENOUS
  Filled 2024-05-26: qty 4

## 2024-05-26 MED ORDER — SODIUM CHLORIDE 0.9 % IV BOLUS
1000.0000 mL | Freq: Once | INTRAVENOUS | Status: AC
  Administered 2024-05-26: 1000 mL via INTRAVENOUS
  Filled 2024-05-26: qty 1000

## 2024-05-26 NOTE — Progress Notes (Signed)
 Diagnosis: Dehydration  Provider:  Praveen Mannam MD  Procedure: IV Infusion  IV Type: Peripheral, IV Location: R Forearm  Normal Saline, Dose: 1000 ml  Infusion Start Time: 1158  Infusion Stop Time: 1409  Zofran  8mg  IVP  Post Infusion IV Care: Peripheral IV Discontinued  Discharge: Condition: Good, Destination: Home . AVS Declined  Performed by:  Donny Childes, RN

## 2024-05-27 NOTE — Progress Notes (Signed)
  Radiation Oncology         [phone]) [phone] ________________________________  Name: Christina Gutierrez MRN: [id number]  Date of Service: 06/06/2024  DOB: 07/06/1976  Post Treatment Telephone Note  Diagnosis:   Bulky, multifocal hepatocellular carcinoma    The patient was not available for call today.   Symptoms of fatigue {ACTIONS; HAVE/HAVE NOT:19434} improved since completing therapy.  Symptoms of skin changes {ACTIONS; HAVE/HAVE NOT:19434} improved since completing therapy.  Symptoms of nausea or vomiting {ACTIONS; HAVE/HAVE NOT:19434} improved since completing therapy.   The patient has scheduled follow up with her medical oncologist Dr. Autumn for ongoing surveillance, and was encouraged to call if she develops concerns or questions regarding radiation.

## 2024-05-30 ENCOUNTER — Ambulatory Visit: Payer: Self-pay

## 2024-05-30 VITALS — BP 92/55 | HR 92 | Temp 98.6°F | Resp 18 | Wt 102.6 lb

## 2024-05-30 DIAGNOSIS — E860 Dehydration: Secondary | ICD-10-CM

## 2024-05-30 DIAGNOSIS — C220 Liver cell carcinoma: Secondary | ICD-10-CM

## 2024-05-30 MED ORDER — SODIUM CHLORIDE 0.9 % IV BOLUS
1000.0000 mL | Freq: Once | INTRAVENOUS | Status: AC
  Administered 2024-05-30: 1000 mL via INTRAVENOUS
  Filled 2024-05-30: qty 1000

## 2024-05-30 MED ORDER — ONDANSETRON HCL 4 MG/2ML IJ SOLN
8.0000 mg | Freq: Once | INTRAMUSCULAR | Status: AC
  Administered 2024-05-30: 8 mg via INTRAVENOUS
  Filled 2024-05-30: qty 4

## 2024-05-30 NOTE — Progress Notes (Signed)
 Diagnosis: Dehydration  Provider:  Praveen Mannam MD  Procedure: IV Infusion  IV Type: Peripheral, IV Location: R Antecubital  Normal Saline, Dose: 1000 ml  Infusion Start Time: 1154  Infusion Stop Time: 1409  Post Infusion IV Care: Peripheral IV Discontinued  Discharge: Condition: Good, Destination: Home . AVS Declined  Performed by:  Braniyah Besse, RN

## 2024-06-01 ENCOUNTER — Ambulatory Visit (INDEPENDENT_AMBULATORY_CARE_PROVIDER_SITE_OTHER): Payer: Self-pay

## 2024-06-01 VITALS — BP 90/52 | HR 62 | Temp 98.8°F | Resp 16 | Ht 60.0 in | Wt 103.0 lb

## 2024-06-01 DIAGNOSIS — E860 Dehydration: Secondary | ICD-10-CM

## 2024-06-01 DIAGNOSIS — C220 Liver cell carcinoma: Secondary | ICD-10-CM

## 2024-06-01 MED ORDER — SODIUM CHLORIDE 0.9 % IV BOLUS
1000.0000 mL | Freq: Once | INTRAVENOUS | Status: AC
  Administered 2024-06-01: 1000 mL via INTRAVENOUS
  Filled 2024-06-01: qty 1000

## 2024-06-01 MED ORDER — ONDANSETRON HCL 4 MG/2ML IJ SOLN
8.0000 mg | Freq: Once | INTRAMUSCULAR | Status: AC
  Administered 2024-06-01: 8 mg via INTRAVENOUS
  Filled 2024-06-01: qty 4

## 2024-06-01 NOTE — Progress Notes (Signed)
 Diagnosis:  Dehydration  Provider:  Mannam, Praveen MD  Procedure: IV Infusion  IV Type: Peripheral, IV Location: R Antecubital  Normal Saline, Dose: 1000 ml  Infusion Start Time: 1200  Infusion Stop Time: 1410  Zofran , Dose: 8mg   Post Infusion IV Care: Peripheral IV Discontinued  Discharge: Condition: Good, Destination: Home . AVS Declined  Performed by:  Eleanor DELENA Bloch, RN

## 2024-06-06 ENCOUNTER — Ambulatory Visit
Admission: RE | Admit: 2024-06-06 | Discharge: 2024-06-06 | Disposition: A | Discharge status: Home / Self Care | Payer: Self-pay | Source: Ambulatory Visit | Attending: Radiation Oncology | Admitting: Radiation Oncology

## 2024-06-06 DIAGNOSIS — C220 Liver cell carcinoma: Secondary | ICD-10-CM

## 2024-06-09 ENCOUNTER — Encounter (HOSPITAL_COMMUNITY): Payer: Self-pay | Admitting: Oncology

## 2024-06-09 ENCOUNTER — Other Ambulatory Visit (HOSPITAL_COMMUNITY): Payer: Self-pay

## 2024-06-09 MED ORDER — MORPHINE SULFATE ER 15 MG PO TBCR
15.0000 mg | EXTENDED_RELEASE_TABLET | Freq: Two times a day (BID) | ORAL | 0 refills | Status: DC
  Filled 2024-06-09: qty 30, 15d supply, fill #0

## 2024-06-17 ENCOUNTER — Other Ambulatory Visit (HOSPITAL_COMMUNITY): Payer: Self-pay

## 2024-06-20 ENCOUNTER — Other Ambulatory Visit: Payer: Self-pay

## 2024-06-20 ENCOUNTER — Encounter (HOSPITAL_COMMUNITY): Payer: Self-pay

## 2024-06-20 ENCOUNTER — Inpatient Hospital Stay (HOSPITAL_COMMUNITY)
Admission: EM | Admit: 2024-06-20 | Discharge: 2024-06-23 | DRG: 641 | Disposition: A | Discharge status: Hospice | Payer: Self-pay | Attending: Internal Medicine | Admitting: Internal Medicine

## 2024-06-20 ENCOUNTER — Emergency Department (HOSPITAL_COMMUNITY): Payer: Self-pay

## 2024-06-20 DIAGNOSIS — Z515 Encounter for palliative care: Secondary | ICD-10-CM

## 2024-06-20 DIAGNOSIS — E860 Dehydration: Principal | ICD-10-CM | POA: Diagnosis present

## 2024-06-20 DIAGNOSIS — G893 Neoplasm related pain (acute) (chronic): Secondary | ICD-10-CM | POA: Diagnosis present

## 2024-06-20 DIAGNOSIS — Z79899 Other long term (current) drug therapy: Secondary | ICD-10-CM

## 2024-06-20 DIAGNOSIS — D638 Anemia in other chronic diseases classified elsewhere: Secondary | ICD-10-CM | POA: Diagnosis present

## 2024-06-20 DIAGNOSIS — Z603 Acculturation difficulty: Secondary | ICD-10-CM | POA: Diagnosis present

## 2024-06-20 DIAGNOSIS — R112 Nausea with vomiting, unspecified: Secondary | ICD-10-CM | POA: Diagnosis present

## 2024-06-20 DIAGNOSIS — K529 Noninfective gastroenteritis and colitis, unspecified: Secondary | ICD-10-CM | POA: Diagnosis present

## 2024-06-20 DIAGNOSIS — Z1152 Encounter for screening for COVID-19: Secondary | ICD-10-CM

## 2024-06-20 DIAGNOSIS — J189 Pneumonia, unspecified organism: Principal | ICD-10-CM

## 2024-06-20 DIAGNOSIS — C220 Liver cell carcinoma: Secondary | ICD-10-CM | POA: Diagnosis present

## 2024-06-20 DIAGNOSIS — B1910 Unspecified viral hepatitis B without hepatic coma: Secondary | ICD-10-CM | POA: Diagnosis present

## 2024-06-20 DIAGNOSIS — R791 Abnormal coagulation profile: Secondary | ICD-10-CM | POA: Diagnosis present

## 2024-06-20 LAB — URINALYSIS, ROUTINE W REFLEX MICROSCOPIC
Bilirubin Urine: NEGATIVE
Glucose, UA: NEGATIVE mg/dL
Hgb urine dipstick: NEGATIVE
Ketones, ur: NEGATIVE mg/dL
Leukocytes,Ua: NEGATIVE
Nitrite: NEGATIVE
Protein, ur: NEGATIVE mg/dL
Specific Gravity, Urine: 1.044 — ABNORMAL HIGH (ref 1.005–1.030)
pH: 7 (ref 5.0–8.0)

## 2024-06-20 LAB — COMPREHENSIVE METABOLIC PANEL WITH GFR
ALT: 109 U/L — ABNORMAL HIGH (ref 0–44)
AST: 254 U/L — ABNORMAL HIGH (ref 15–41)
Albumin: 3.2 g/dL — ABNORMAL LOW (ref 3.5–5.0)
Alkaline Phosphatase: 134 U/L — ABNORMAL HIGH (ref 38–126)
Anion gap: 13 (ref 5–15)
BUN: 6 mg/dL (ref 6–20)
CO2: 22 mmol/L (ref 22–32)
Calcium: 9.9 mg/dL (ref 8.9–10.3)
Chloride: 98 mmol/L (ref 98–111)
Creatinine, Ser: 0.52 mg/dL (ref 0.44–1.00)
GFR, Estimated: >60 mL/min (ref >60)
Glucose, Bld: 101 mg/dL — ABNORMAL HIGH (ref 70–99)
Potassium: 4.3 mmol/L (ref 3.5–5.1)
Sodium: 133 mmol/L — ABNORMAL LOW (ref 135–145)
Total Bilirubin: 2.4 mg/dL — ABNORMAL HIGH (ref 0.0–1.2)
Total Protein: 7.5 g/dL (ref 6.5–8.1)

## 2024-06-20 LAB — CBC
HCT: 24.4 % — ABNORMAL LOW (ref 36.0–46.0)
Hemoglobin: 8 g/dL — ABNORMAL LOW (ref 12.0–15.0)
MCH: 31 pg (ref 26.0–34.0)
MCHC: 32.8 g/dL (ref 30.0–36.0)
MCV: 94.6 fL (ref 80.0–100.0)
Platelets: 230 K/uL (ref 150–400)
RBC: 2.58 MIL/uL — ABNORMAL LOW (ref 3.87–5.11)
RDW: 26 % — ABNORMAL HIGH (ref 11.5–15.5)
WBC: 5.7 K/uL (ref 4.0–10.5)
nRBC: 0 % (ref 0.0–0.2)

## 2024-06-20 LAB — LIPASE, BLOOD: Lipase: 11 U/L (ref 11–51)

## 2024-06-20 LAB — RESP PANEL BY RT-PCR (RSV, FLU A&B, COVID)  RVPGX2
Influenza A by PCR: NEGATIVE
Influenza B by PCR: NEGATIVE
Resp Syncytial Virus by PCR: NEGATIVE
SARS Coronavirus 2 by RT PCR: NEGATIVE

## 2024-06-20 LAB — PROTIME-INR
INR: 1.5 — ABNORMAL HIGH (ref 0.8–1.2)
Prothrombin Time: 18.9 s — ABNORMAL HIGH (ref 11.4–15.2)

## 2024-06-20 LAB — AMMONIA: Ammonia: 74 umol/L — ABNORMAL HIGH (ref 9–35)

## 2024-06-20 LAB — HCG, SERUM, QUALITATIVE: Preg, Serum: NEGATIVE

## 2024-06-20 LAB — CK: Total CK: 13 U/L — ABNORMAL LOW (ref 38–234)

## 2024-06-20 MED ORDER — SODIUM CHLORIDE 0.9 % IV SOLN
500.0000 mg | Freq: Once | INTRAVENOUS | Status: AC
  Administered 2024-06-20: 17:00:00 500 mg via INTRAVENOUS
  Filled 2024-06-20: qty 5

## 2024-06-20 MED ORDER — ONDANSETRON HCL 4 MG/2ML IJ SOLN
4.0000 mg | Freq: Once | INTRAMUSCULAR | Status: AC
  Administered 2024-06-20: 13:00:00 4 mg via INTRAVENOUS
  Filled 2024-06-20: qty 2

## 2024-06-20 MED ORDER — SODIUM CHLORIDE 0.9 % IV BOLUS
1000.0000 mL | Freq: Once | INTRAVENOUS | Status: AC
  Administered 2024-06-20: 13:00:00 1000 mL via INTRAVENOUS

## 2024-06-20 MED ORDER — TENOFOVIR ALAFENAMIDE FUMARATE 25 MG PO TABS
25.0000 mg | ORAL_TABLET | Freq: Every day | ORAL | Status: DC
  Administered 2024-06-20 – 2024-06-22 (×2): 25 mg via ORAL
  Filled 2024-06-20 (×4): qty 1

## 2024-06-20 MED ORDER — LACTULOSE 10 GM/15ML PO SOLN
20.0000 g | Freq: Three times a day (TID) | ORAL | Status: DC
  Administered 2024-06-20 – 2024-06-22 (×5): 20 g via ORAL
  Filled 2024-06-20 (×6): qty 30

## 2024-06-20 MED ORDER — SODIUM CHLORIDE 0.9 % IV SOLN
1.0000 g | INTRAVENOUS | Status: DC
  Administered 2024-06-21: 13:00:00 1 g via INTRAVENOUS
  Filled 2024-06-20: qty 10

## 2024-06-20 MED ORDER — MORPHINE SULFATE ER 15 MG PO TBCR
15.0000 mg | EXTENDED_RELEASE_TABLET | ORAL | Status: AC
  Administered 2024-06-20: 15 mg via ORAL
  Filled 2024-06-20: qty 1

## 2024-06-20 MED ORDER — ALPRAZOLAM 0.5 MG PO TABS: 0.5000 mg | ORAL_TABLET | Freq: Two times a day (BID) | ORAL | Status: DC | PRN

## 2024-06-20 MED ORDER — ONDANSETRON HCL 4 MG PO TABS: 4.0000 mg | ORAL_TABLET | Freq: Four times a day (QID) | ORAL | Status: DC | PRN

## 2024-06-20 MED ORDER — MORPHINE SULFATE (PF) 4 MG/ML IV SOLN
4.0000 mg | Freq: Once | INTRAVENOUS | Status: AC
  Administered 2024-06-20: 13:00:00 4 mg via INTRAVENOUS
  Filled 2024-06-20: qty 1

## 2024-06-20 MED ORDER — MORPHINE SULFATE 15 MG PO TABS
15.0000 mg | ORAL_TABLET | ORAL | Status: DC | PRN
  Administered 2024-06-20 – 2024-06-22 (×4): 15 mg via ORAL
  Filled 2024-06-20 (×4): qty 1

## 2024-06-20 MED ORDER — MORPHINE SULFATE ER 15 MG PO TBCR
15.0000 mg | EXTENDED_RELEASE_TABLET | Freq: Two times a day (BID) | ORAL | Status: DC
  Administered 2024-06-21 – 2024-06-22 (×3): 15 mg via ORAL
  Filled 2024-06-20 (×4): qty 1

## 2024-06-20 MED ORDER — ONDANSETRON HCL 4 MG/2ML IJ SOLN: 4.0000 mg | Freq: Four times a day (QID) | INTRAMUSCULAR | Status: DC | PRN

## 2024-06-20 MED ORDER — SODIUM CHLORIDE 0.9 % IV BOLUS
1000.0000 mL | Freq: Once | INTRAVENOUS | Status: AC
  Administered 2024-06-20: 17:00:00 1000 mL via INTRAVENOUS

## 2024-06-20 MED ORDER — SODIUM CHLORIDE 0.9 % IV SOLN
500.0000 mg | INTRAVENOUS | Status: DC
  Filled 2024-06-20: qty 5

## 2024-06-20 MED ORDER — IOHEXOL 300 MG/ML  SOLN
100.0000 mL | Freq: Once | INTRAMUSCULAR | Status: AC | PRN
  Administered 2024-06-20: 13:00:00 100 mL via INTRAVENOUS

## 2024-06-20 MED ORDER — SODIUM CHLORIDE 0.9 % IV SOLN: INTRAVENOUS | Status: DC

## 2024-06-20 MED ORDER — PROCHLORPERAZINE EDISYLATE 10 MG/2ML IJ SOLN
10.0000 mg | Freq: Four times a day (QID) | INTRAMUSCULAR | Status: DC | PRN
  Administered 2024-06-22: 12:00:00 10 mg via INTRAVENOUS
  Filled 2024-06-20: qty 2

## 2024-06-20 MED ORDER — SODIUM CHLORIDE 0.9 % IV SOLN
1.0000 g | Freq: Once | INTRAVENOUS | Status: AC
  Administered 2024-06-20: 17:00:00 1 g via INTRAVENOUS
  Filled 2024-06-20: qty 10

## 2024-06-20 NOTE — H&P (Addendum)
 History and Physical    Christina Gutierrez FMW:[id number] DOB: December 30, 1975 DOA: 06/20/2024  I have briefly reviewed the patient's prior medical records in [facility]  PCP: [name], MD  Patient coming from: home.  iPad interpreter used  Chief Complaint: Nausea, vomiting, generalized bodyaches for the past 2 days.  HPI: Christina Gutierrez is a 48 y.o. female with medical history significant of hepatocellular carcinoma following with oncology as an outpatient, used to be on atezolizumab  but discontinued due to worsening fatigue, failure to thrive, apparently has been offered palliative radiation and IR evaluation, also to switch treatment to ramucirumab or lenvatinib, but apparently she deferred treatments and plans to go back to [place].  She has been having poor p.o. intake and has been dehydrated, frequently having to go to the infusion center for IV fluids.  She reports feeling quite poorly over the last 2 days and feeling quite dehydrated.  She denies any fever or chills.  She complains of a lot of nausea and vomiting.  She denies any diarrhea and the stools are normal and formed.  She denies any chest pain, denies any shortness of breath.  She has not had any cough or chest congestion.  Of note, she was admitted recently with a small bowel obstruction and her symptoms now feel similar to then  ED Course: In the emergency room she is afebrile, normotensive, satting well on room air.  Blood work reveals elevated LFTs, elevated ammonia to 74, hemoglobin of 8.0, INR 1.5.  Urinalysis, respiratory virus panel fairly unremarkable.  Chest x-ray showed bibasilar atelectasis raising concern about potential pneumonia.  CT of the abdomen and pelvis showing some bowel wall thickening in the ascending colon, splenic flexure and segment of small bowel without pneumatosis, potentially reflecting portal hypertensive colopathy or enterocolitis without CT evidence of mechanical  obstruction.  There was also mention of small right hepatic subcapsular fluid, possibly reactive versus evolving subcapsular hematoma given tumor burden and risk of rupture.  Symptoms not well-controlled in the ER and we are asked to admit  Review of Systems: All systems reviewed, and apart from HPI, all negative  Past Medical History:  Diagnosis Date   Cancer (HCC)    Hepatocellular carcinoma (HCC) 08/22/2023   Metabolic acidosis 08/21/2023    Past Surgical History:  Procedure Laterality Date   CESAREAN SECTION     IR IMAGING GUIDED PORT INSERTION  09/22/2023     reports that she has never smoked. She has never used smokeless tobacco. She reports that she does not drink alcohol and does not use drugs.  Allergies[1]  History reviewed. No pertinent family history.  Prior to Admission medications  Medication Sig Start Date End Date Taking? Authorizing Provider  acetaminophen  (TYLENOL ) 325 MG tablet Take 2 tablets (650 mg total) by mouth every 6 (six) hours as needed for mild pain (pain score 1-3) or fever (or Fever >/= 101). 05/16/24   Barbarann Nest, MD  lidocaine -prilocaine  (EMLA ) cream Apply topically as needed. 05/13/24   [place], [name] PARAS, DO  morphine  (MS CONTIN ) 15 MG 12 hr tablet Take 1 tablet (15 mg total) by mouth every 12 (twelve) hours. 06/09/24     morphine  (MSIR) 15 MG tablet Take 1 tablet (15 mg total) by mouth every 3 (three) hours as needed for severe pain (pain score 7-10). 05/13/24   [place], [name] PARAS, DO  ondansetron  (ZOFRAN ) 8 MG tablet Take 1 tablet (8 mg total) by mouth every 8 (eight) hours as needed for nausea  or vomiting. 05/13/24   [place], [name] PARAS, DO  polyethylene glycol powder (GLYCOLAX /MIRALAX ) 17 GM/SCOOP powder Take 17 g by mouth daily. Dissolve 1 capful (17g) in 4-8 ounces of liquid and take by mouth daily. 05/16/24   Barbarann Nest, MD  prochlorperazine  (COMPAZINE ) 10 MG tablet Take 1 tablet (10 mg total) by mouth every 6 (six) hours as needed for nausea or  vomiting. 04/29/24   Pasam, Chinita, MD  promethazine  (PHENERGAN ) 12.5 MG tablet Take 1 tablet (12.5 mg total) by mouth every 6 (six) hours as needed for nausea or vomiting. 04/30/24   Garrick Charleston, MD  senna-docusate (SENOKOT-S) 8.6-50 MG tablet Take 1 tablet by mouth 2 (two) times daily. 05/16/24 08/14/24  Barbarann Nest, MD  tenofovir  alafenamide (VEMLIDY ) 25 MG tablet Take 1 tablet (25 mg total) by mouth daily. 05/17/24   Fleeta Kathie Jomarie LOISE, MD    Physical Exam: Vitals:   06/20/24 1145 06/20/24 1201 06/20/24 1245 06/20/24 1448  BP: (!) 106/54  (!) 105/50 (!) 102/45  Pulse: 81   76  Resp:    20  Temp:    98.3 F (36.8 C)  TempSrc:    Oral  SpO2: 94%   99%  Weight:  46 kg    Height:  5' (1.524 m)      Constitutional: NAD, calm, comfortable Eyes: PERRL, lids and conjunctivae slightly icteric ENMT: Mucous membranes are dry.  Neck: normal, supple Respiratory: Faint bibasilar rhonchi, no wheezing, no crackles.  Normal respiratory effort Cardiovascular: Regular rate and rhythm, no murmurs / rubs / gallops. No extremity edema.   Abdomen: Mild tenderness throughout, no guarding or rebound.  Bowel sounds positive Musculoskeletal: no clubbing / cyanosis. Normal muscle tone.  Skin: no rashes Neurologic: CN 2-12 grossly intact. Strength 5/5 in all 4.  Psychiatric: Normal judgment and insight. Alert and oriented x 3. Normal mood.   Labs on Admission: I have personally reviewed following labs and imaging studies  CBC: Recent Labs  Lab 06/20/24 1227  WBC 5.7  HGB 8.0*  HCT 24.4*  MCV 94.6  PLT 230   Basic Metabolic Panel: Recent Labs  Lab 06/20/24 1227  NA 133*  K 4.3  CL 98  CO2 22  GLUCOSE 101*  BUN 6  CREATININE 0.52  CALCIUM 9.9   Liver Function Tests: Recent Labs  Lab 06/20/24 1227  AST 254*  ALT 109*  ALKPHOS 134*  BILITOT 2.4*  PROT 7.5  ALBUMIN 3.2*   Coagulation Profile: Recent Labs  Lab 06/20/24 1336  INR 1.5*   BNP (last 3 results) No  results for input(s): PROBNP in the last 8760 hours. CBG: No results for input(s): GLUCAP in the last 168 hours. Thyroid  Function Tests: No results for input(s): TSH, T4TOTAL, FREET4, T3FREE, THYROIDAB in the last 72 hours. Urine analysis:    Component Value Date/Time   COLORURINE YELLOW 06/20/2024 1441   APPEARANCEUR CLEAR 06/20/2024 1441   LABSPEC 1.044 (H) 06/20/2024 1441   PHURINE 7.0 06/20/2024 1441   GLUCOSEU NEGATIVE 06/20/2024 1441   HGBUR NEGATIVE 06/20/2024 1441   BILIRUBINUR NEGATIVE 06/20/2024 1441   KETONESUR NEGATIVE 06/20/2024 1441   PROTEINUR NEGATIVE 06/20/2024 1441   NITRITE NEGATIVE 06/20/2024 1441   LEUKOCYTESUR NEGATIVE 06/20/2024 1441     Radiological Exams on Admission: DG Chest Portable 1 View Result Date: 06/20/2024 CLINICAL DATA:  Fatigue. EXAM: PORTABLE CHEST 1 VIEW COMPARISON:  Chest radiograph dated 04/16/2024. FINDINGS: Right-sided Port-A-Cath with tip at the cavoatrial junction. There is shallow inspiration with bibasilar  atelectasis. Pneumonia is not excluded no pleural effusion pneumothorax. Stable cardiac silhouette no acute osseous pathology. IMPRESSION: Shallow inspiration with bibasilar atelectasis. Pneumonia is not excluded. Electronically Signed   By: Vanetta Chou M.D.   On: 06/20/2024 14:04   CT ABDOMEN PELVIS W CONTRAST Result Date: 06/20/2024 EXAM: CT ABDOMEN AND PELVIS WITH CONTRAST 06/20/2024 01:23:05 PM TECHNIQUE: CT of the abdomen and pelvis was performed with the administration of 100 mL of iohexol  (OMNIPAQUE ) 300 MG/ML solution. Multiplanar reformatted images are provided for review. Automated exposure control, iterative reconstruction, and/or weight-based adjustment of the mA/kV was utilized to reduce the radiation dose to as low as reasonably achievable. COMPARISON: Prior study dated 05/12/2024. CLINICAL HISTORY: Emesis with known masses in abdomen pushing on stomach. Rule out obstruction. Body aches, nausea, and poor  appetite for 3 days. History of liver cancer, not currently undergoing treatment. FINDINGS: LOWER CHEST: Coarse and somewhat nodular interstitial changes in the lung bases would be suspicious for lymphangitis tumor spread. Infectious or inflammatory etiologies could also cause this. Calcified granuloma in the left lung base. Small right pleural effusion with basilar atelectasis. Cardiac enlargement. LIVER: There is diffuse heterogeneous enlargement of the liver, which is filled with heterogeneous and poorly defined tumor masses replacing the hepatic parenchyma. The largest mass in the right lobe, it has been measured for correlation and today measures 14.1 cm, compared with 15.4 cm previously. This likely indicates no change in size. Abdominal contents are displaced by the enlarged liver. Small subcapsular fluid along the right lobe of the liver may be mildly increased since prior study. This is probably reactive, but could possibly indicate subcapsular hematoma. Based on the size of the liver, the lesions would be at risk for rupture. GALLBLADDER AND BILE DUCTS: Cholelithiasis with contracted gallbladder. No bile duct dilatation. SPLEEN: The spleen size is normal. There is a subcentimeter hyperattenuating lesion in the spleen probably representing hemangioma. PANCREAS: No acute abnormality. ADRENAL GLANDS: No acute abnormality. KIDNEYS, URETERS AND BLADDER: The bladder and kidneys are unremarkable. No stones in the kidneys or ureters. No hydronephrosis. No perinephric or periureteral stranding. GI AND BOWEL: Stomach demonstrates no acute abnormality. The ascending colon and splenic flexure, as well as some loops of small bowel, demonstrate wall dilatation. This could represent portal hypertensive colopathy or enterocolitis. No pneumatosis. There is no bowel obstruction. PERITONEUM AND RETROPERITONEUM: Small amount of free fluid in the abdomen and pelvis likely represents ascites. No increased density to suggest  free air hemorrhage. VASCULATURE: Multiple upper abdominal varices are present. Mesenteric and left lower quadrant varices. Umbilical vein varices. The aorta is normal in caliber. LYMPH NODES: No significant retroperitoneal lymphadenopathy. REPRODUCTIVE ORGANS: The uterus is somewhat enlarged, possibly indicating uterine fibroids. No abnormal adnexal masses. BONES AND SOFT TISSUES: No acute osseous abnormality. No focal soft tissue abnormality. IMPRESSION: 1. Diffuse heterogeneous hepatic involvement by multifocal tumor with marked hepatomegaly; dominant right lobe lesion 14.1 cm, not significantly changed from 15.4 cm previously. No evidence of new metastatic disease within the abdomen or pelvis. 2. Suspected portal hypertension with multiple varices and small-volume ascites. 3. Small right hepatic subcapsular fluid, favored reactive; evolving subcapsular hematoma is less likely but not excluded given tumor burden and risk of rupture. 4. Bowel wall thickening involving the ascending colon, splenic flexure, and segments of small bowel without pneumatosis, which may reflect portal hypertensive colopathy or enterocolitis; no CT evidence of mechanical obstruction. 5. Cholelithiasis with contracted gallbladder. No biliary dilatation. Electronically signed by: Elsie Gravely MD 06/20/2024 01:47 PM EST RP  Workstation: [id number]    EKG: Independently reviewed.  Sinus rhythm  Assessment/Plan Principal problem Intractable nausea, vomiting, possible enteritis -CT scan, as above raise concern about a degree of enterocolitis.  I discussed the case with Dr. Conchetta, her oncologist, as given the fact that she was on atezolizumab  wether her colitis could be a side effect from this medication.  While possible, it does not really fit the timeline as she last got this medication in August and it would be somewhat unusual to appear this late. - Continue supportive care for now with IV fluids, clear liquid, advance diet as  tolerated, antiemetics and pain control - Could potentially think about using steroids if it is medication induced colitis, but will try conservative management first  Active problems Possible pneumonia -clinically unlikely, however she was started on antibiotics and I would favor to continue, it would cover her potential enteritis as well  Hepatocellular carcinoma -CT scan reiterated diffuse heterogeneous hepatic involvement with marked hepatomegaly, not significantly changed.  Per oncology, disease appears to be stable based on imaging however clinically she has lost weight and has not been doing this good.  Per outpatient notes, has been offered additional treatment however she declined.  She tells me a slightly different story in the sense that she was told she was going to die from this and was not given any more treatment.  Continue outpatient follow-up if she remains here versus local follow-up in [place] if she decides to leave the area  Elevated LFTs -somewhat similar to her prior values.  Anemia-of liver disease  Elevated ammonia-likely in the setting of poor ammonia clearance due to advanced liver disease.  She does not appear confused.  Placed on lactulose   Elevated INR -in the setting of underlying liver disease.  Hold Lovenox /heparin  for DVT prophylaxis given concern for evolving subcapsular hematoma on the CT scan  Chronic pain-continue home regimen  History of hep B-continue home medication  DVT prophylaxis: SCDs Code Status: Full code per patient Family Communication: Family present at bedside Bed Type: MedSurg Consults called: Brief phone discussion with oncology, not formal consult Obs/Inp: Observation   Nilda Fendt, MD, PhD [facility]  Contact via [email or web address]  06/20/2024, 4:17 PM         [1] No Known Allergies

## 2024-06-20 NOTE — ED Provider Notes (Signed)
 [place] [facility] DEPARTMENT AT [facility] Provider Note   CSN: [id number] Arrival date & time: 06/20/24  1127     Patient presents with: Abdominal Pain   Christina Gutierrez is a 48 y.o. female.   The history is provided by the patient, medical records and a relative. The history is limited by a language barrier. A language interpreter was used.  Abdominal Pain Pain location:  Generalized Pain quality: aching and cramping   Pain radiates to:  Does not radiate Pain severity:  Severe Onset quality:  Gradual Duration:  4 days Timing:  Constant Progression:  Worsening Chronicity:  New Context: not trauma   Relieved by:  Nothing Ineffective treatments:  None tried Associated symptoms: constipation (resolved now per pt), fatigue, nausea and vomiting   Associated symptoms: no chest pain, no chills, no cough, no diarrhea, no dysuria, no fever, no hematemesis and no shortness of breath        Prior to Admission medications  Medication Sig Start Date End Date Taking? Authorizing Provider  acetaminophen  (TYLENOL ) 325 MG tablet Take 2 tablets (650 mg total) by mouth every 6 (six) hours as needed for mild pain (pain score 1-3) or fever (or Fever >/= 101). 05/16/24   Barbarann Nest, MD  lidocaine -prilocaine  (EMLA ) cream Apply topically as needed. 05/13/24   [place], [name] PARAS, DO  morphine  (MS CONTIN ) 15 MG 12 hr tablet Take 1 tablet (15 mg total) by mouth every 12 (twelve) hours. 06/09/24     morphine  (MSIR) 15 MG tablet Take 1 tablet (15 mg total) by mouth every 3 (three) hours as needed for severe pain (pain score 7-10). 05/13/24   [place], [name] PARAS, DO  ondansetron  (ZOFRAN ) 8 MG tablet Take 1 tablet (8 mg total) by mouth every 8 (eight) hours as needed for nausea or vomiting. 05/13/24   [place], [name] PARAS, DO  polyethylene glycol powder (GLYCOLAX /MIRALAX ) 17 GM/SCOOP powder Take 17 g by mouth daily. Dissolve 1 capful (17g) in 4-8 ounces of liquid and take by mouth daily.  05/16/24   Barbarann Nest, MD  prochlorperazine  (COMPAZINE ) 10 MG tablet Take 1 tablet (10 mg total) by mouth every 6 (six) hours as needed for nausea or vomiting. 04/29/24   Pasam, Chinita, MD  promethazine  (PHENERGAN ) 12.5 MG tablet Take 1 tablet (12.5 mg total) by mouth every 6 (six) hours as needed for nausea or vomiting. 04/30/24   Garrick Charleston, MD  senna-docusate (SENOKOT-S) 8.6-50 MG tablet Take 1 tablet by mouth 2 (two) times daily. 05/16/24 08/14/24  Barbarann Nest, MD  tenofovir  alafenamide (VEMLIDY ) 25 MG tablet Take 1 tablet (25 mg total) by mouth daily. 05/17/24   Fleeta Kathie Jomarie LOISE, MD    Allergies: Patient has no known allergies.    Review of Systems  Constitutional:  Positive for fatigue. Negative for chills, diaphoresis and fever.  HENT:  Negative for congestion.   Respiratory:  Negative for cough, chest tightness, shortness of breath and wheezing.   Cardiovascular:  Negative for chest pain, palpitations and leg swelling.  Gastrointestinal:  Positive for abdominal pain, constipation (resolved now per pt), nausea and vomiting. Negative for diarrhea and hematemesis.  Genitourinary:  Negative for dysuria, flank pain and frequency.  Musculoskeletal:  Negative for back pain, neck pain and neck stiffness.  Skin:  Negative for rash and wound.  Neurological:  Negative for dizziness, weakness, light-headedness and headaches.  Psychiatric/Behavioral:  Negative for agitation and confusion.   All other systems reviewed and are negative.   Updated  Vital Signs BP (!) 114/48 (BP Location: Left Arm)   Pulse 82   Temp 98.8 F (37.1 C) (Oral)   Resp 16   Ht 5' (1.524 m)   Wt 46 kg   LMP 06/21/2023 (Approximate)   SpO2 96%   BMI 19.81 kg/m   Physical Exam Vitals and nursing note reviewed.  Constitutional:      General: She is not in acute distress.    Appearance: She is well-developed. She is not ill-appearing, toxic-appearing or diaphoretic.  HENT:     Head:  Normocephalic and atraumatic.  Eyes:     General: Scleral icterus present.     Extraocular Movements: Extraocular movements intact.     Conjunctiva/sclera: Conjunctivae normal.  Cardiovascular:     Rate and Rhythm: Regular rhythm. Tachycardia present.     Heart sounds: No murmur heard. Pulmonary:     Effort: Pulmonary effort is normal. No respiratory distress.     Breath sounds: Normal breath sounds. No wheezing, rhonchi or rales.  Chest:     Chest wall: No tenderness.  Abdominal:     General: Bowel sounds are normal. There is no distension.     Palpations: Abdomen is soft.     Tenderness: There is no abdominal tenderness. There is no right CVA tenderness, left CVA tenderness, guarding or rebound.  Musculoskeletal:        General: No swelling.     Cervical back: Neck supple.  Skin:    General: Skin is warm and dry.     Capillary Refill: Capillary refill takes less than 2 seconds.     Coloration: Skin is not pale.     Findings: No rash.  Neurological:     General: No focal deficit present.     Mental Status: She is alert.     (all labs ordered are listed, but only abnormal results are displayed) Labs Reviewed  COMPREHENSIVE METABOLIC PANEL WITH GFR - Abnormal; Notable for the following components:      Result Value   Sodium 133 (*)    Glucose, Bld 101 (*)    Albumin 3.2 (*)    AST 254 (*)    ALT 109 (*)    Alkaline Phosphatase 134 (*)    Total Bilirubin 2.4 (*)    All other components within normal limits  CBC - Abnormal; Notable for the following components:   RBC 2.58 (*)    Hemoglobin 8.0 (*)    HCT 24.4 (*)    RDW 26.0 (*)    All other components within normal limits  URINALYSIS, ROUTINE W REFLEX MICROSCOPIC - Abnormal; Notable for the following components:   Specific Gravity, Urine 1.044 (*)    All other components within normal limits  AMMONIA - Abnormal; Notable for the following components:   Ammonia 74 (*)    All other components within normal limits  CK  - Abnormal; Notable for the following components:   Total CK 13 (*)    All other components within normal limits  PROTIME-INR - Abnormal; Notable for the following components:   Prothrombin Time 18.9 (*)    INR 1.5 (*)    All other components within normal limits  RESP PANEL BY RT-PCR (RSV, FLU A&B, COVID)  RVPGX2  LIPASE, BLOOD  HCG, SERUM, QUALITATIVE    EKG: EKG Interpretation Date/Time:  Monday June 20 2024 13:38:16 EST Ventricular Rate:  83 PR Interval:  373 QRS Duration:  92 QT Interval:  374 QTC Calculation: 440 R Axis:  140  Text Interpretation: wandering baseline, will repeat. Confirmed by Ginger Barefoot ([id number]) on 06/20/2024 1:41:31 PM  ED ECG REPORT   Date: 06/20/2024  Rate: 80  Rhythm: normal sinus rhythm  QRS Axis: right  Intervals: PR shortened  ST/T Wave abnormalities: borederline repolarization abnormality  Conduction Disutrbances:none  Narrative Interpretation:   Old EKG Reviewed: less artifact and wandering baseline  I have personally reviewed the EKG tracing and agree with the computerized printout as noted.   Radiology: DG Chest Portable 1 View Result Date: 06/20/2024 CLINICAL DATA:  Fatigue. EXAM: PORTABLE CHEST 1 VIEW COMPARISON:  Chest radiograph dated 04/16/2024. FINDINGS: Right-sided Port-A-Cath with tip at the cavoatrial junction. There is shallow inspiration with bibasilar atelectasis. Pneumonia is not excluded no pleural effusion pneumothorax. Stable cardiac silhouette no acute osseous pathology. IMPRESSION: Shallow inspiration with bibasilar atelectasis. Pneumonia is not excluded. Electronically Signed   By: Vanetta Chou M.D.   On: 06/20/2024 14:04   CT ABDOMEN PELVIS W CONTRAST Result Date: 06/20/2024 EXAM: CT ABDOMEN AND PELVIS WITH CONTRAST 06/20/2024 01:23:05 PM TECHNIQUE: CT of the abdomen and pelvis was performed with the administration of 100 mL of iohexol  (OMNIPAQUE ) 300 MG/ML solution. Multiplanar reformatted images are  provided for review. Automated exposure control, iterative reconstruction, and/or weight-based adjustment of the mA/kV was utilized to reduce the radiation dose to as low as reasonably achievable. COMPARISON: Prior study dated 05/12/2024. CLINICAL HISTORY: Emesis with known masses in abdomen pushing on stomach. Rule out obstruction. Body aches, nausea, and poor appetite for 3 days. History of liver cancer, not currently undergoing treatment. FINDINGS: LOWER CHEST: Coarse and somewhat nodular interstitial changes in the lung bases would be suspicious for lymphangitis tumor spread. Infectious or inflammatory etiologies could also cause this. Calcified granuloma in the left lung base. Small right pleural effusion with basilar atelectasis. Cardiac enlargement. LIVER: There is diffuse heterogeneous enlargement of the liver, which is filled with heterogeneous and poorly defined tumor masses replacing the hepatic parenchyma. The largest mass in the right lobe, it has been measured for correlation and today measures 14.1 cm, compared with 15.4 cm previously. This likely indicates no change in size. Abdominal contents are displaced by the enlarged liver. Small subcapsular fluid along the right lobe of the liver may be mildly increased since prior study. This is probably reactive, but could possibly indicate subcapsular hematoma. Based on the size of the liver, the lesions would be at risk for rupture. GALLBLADDER AND BILE DUCTS: Cholelithiasis with contracted gallbladder. No bile duct dilatation. SPLEEN: The spleen size is normal. There is a subcentimeter hyperattenuating lesion in the spleen probably representing hemangioma. PANCREAS: No acute abnormality. ADRENAL GLANDS: No acute abnormality. KIDNEYS, URETERS AND BLADDER: The bladder and kidneys are unremarkable. No stones in the kidneys or ureters. No hydronephrosis. No perinephric or periureteral stranding. GI AND BOWEL: Stomach demonstrates no acute abnormality. The  ascending colon and splenic flexure, as well as some loops of small bowel, demonstrate wall dilatation. This could represent portal hypertensive colopathy or enterocolitis. No pneumatosis. There is no bowel obstruction. PERITONEUM AND RETROPERITONEUM: Small amount of free fluid in the abdomen and pelvis likely represents ascites. No increased density to suggest free air hemorrhage. VASCULATURE: Multiple upper abdominal varices are present. Mesenteric and left lower quadrant varices. Umbilical vein varices. The aorta is normal in caliber. LYMPH NODES: No significant retroperitoneal lymphadenopathy. REPRODUCTIVE ORGANS: The uterus is somewhat enlarged, possibly indicating uterine fibroids. No abnormal adnexal masses. BONES AND SOFT TISSUES: No acute osseous abnormality. No focal soft tissue  abnormality. IMPRESSION: 1. Diffuse heterogeneous hepatic involvement by multifocal tumor with marked hepatomegaly; dominant right lobe lesion 14.1 cm, not significantly changed from 15.4 cm previously. No evidence of new metastatic disease within the abdomen or pelvis. 2. Suspected portal hypertension with multiple varices and small-volume ascites. 3. Small right hepatic subcapsular fluid, favored reactive; evolving subcapsular hematoma is less likely but not excluded given tumor burden and risk of rupture. 4. Bowel wall thickening involving the ascending colon, splenic flexure, and segments of small bowel without pneumatosis, which may reflect portal hypertensive colopathy or enterocolitis; no CT evidence of mechanical obstruction. 5. Cholelithiasis with contracted gallbladder. No biliary dilatation. Electronically signed by: Elsie Gravely MD 06/20/2024 01:47 PM EST RP Workstation: [id number]     Procedures   Medications Ordered in the ED  cefTRIAXone  (ROCEPHIN ) 1 g in sodium chloride  0.9 % 100 mL IVPB (has no administration in time range)  azithromycin  (ZITHROMAX ) 500 mg in sodium chloride  0.9 % 250 mL IVPB (has no  administration in time range)  sodium chloride  0.9 % bolus 1,000 mL (has no administration in time range)  prochlorperazine  (COMPAZINE ) injection 10 mg (has no administration in time range)  azithromycin  (ZITHROMAX ) 500 mg in sodium chloride  0.9 % 250 mL IVPB (has no administration in time range)  cefTRIAXone  (ROCEPHIN ) 1 g in sodium chloride  0.9 % 100 mL IVPB (has no administration in time range)  sodium chloride  0.9 % bolus 1,000 mL (0 mLs Intravenous Stopped 06/20/24 1411)  ondansetron  (ZOFRAN ) injection 4 mg (4 mg Intravenous Given 06/20/24 1310)  morphine  (PF) 4 MG/ML injection 4 mg (4 mg Intravenous Given 06/20/24 1310)  iohexol  (OMNIPAQUE ) 300 MG/ML solution 100 mL (100 mLs Intravenous Contrast Given 06/20/24 1313)                                    Medical Decision Making Amount and/or Complexity of Data Reviewed Labs: ordered. Radiology: ordered.  Risk Prescription drug management. Decision regarding hospitalization.    Christina Gutierrez is a 48 y.o. female with a past medical history significant for hepatocellular carcinoma, hepatic failure, previous hyperammonemia, portal venous hypertension, and situational insomnia who presents with malaise, fatigue, nausea, vomiting, and diffuse body aches.  According to patient, she is hurting all over and is feeling tired.  She is concerned she is dehydrated as she has not been able to eat or drink hardly anything for the last 3 days.  Anything she eats or drinks comes back up.  She reports pain all over and does not report focal abdominal pain however she does have cancer with abdominal masses that were pushing on her stomach during the last imaging she had.  She reports no trauma.  She denies any dysuria but does report decreased urine.  She reports body aches in all extremities.  She is denying any significant headache or neck stiffness or neck pain at this time.  She is denying any fevers, chills, congestion, or cough.  She  reports no blood in her emesis or stool.  She reports she had constipation earlier but that has improved.  On exam, lungs were clear.  Patient is somewhat jaundiced.  Chest was not focally tender.  Abdomen was not focally tender and I did hear some bowel sounds.  Flanks nontender.  Patient has dry mucous membranes.  Moving all extremities.  She has no focal asterixis on my exam.  Intact sensation and strength in extremities.  Patient is very thin and blood pressure was around 100 systolic on my first evaluation.  Clinically I am concerned that patient is getting dehydrated with all the nausea and vomiting.  With the ongoing pandemic and amount of fluid in community, with all this nausea and vomiting and dehydration we will make sure she does not have the flu.  Will get a chest x-ray.  With her previous imaging showing mass effect onto her stomach, we will get a CT of the pelvis to make sure there is no new bowel obstruction.  Will give her some fluids, pain medicine, and nausea medicine.  She was requesting morphine  as that is helped her in the past and what she takes normally.  Will check urinalysis given the change in urine amount and will check ammonia with her fatigue.  Will check a CK given the diffuse muscle aches.  Anticipate [facility] admission given how ill she appears with all this nausea and vomiting and dehydration.  Anticipate admission after workup.   3:43 PM Workup began to return.  Patient does not have a large bowel obstruction on ET scan but did show some bowel thickening and possible enteritis.  Patient continued to have nausea and vomiting and she was given more fluids.  Blood pressures right around 100 systolic.  She is still not tolerating p.o. well.  Her imaging also showed possible pneumonia on chest x-ray and CT abdomen pelvis and I am concerned about early pneumonia contributing to symptoms as well.  As she is not tolerating much p.o., still is having soft blood pressures, and is  so ill-appearing, do not feel she is safe for discharge home.  Will order IV antibiotics and some more fluids and will admit for further treatment of nausea and vomiting malaise and fatigue in the setting of suspected early pneumonia/gastroenteritis.  She was negative for COVID/flu/RSV.  I had a long discussion with patient and family and they would prefer IV treatment versus going home and not be able to oral antibiotics.  Will call for admission.       Final diagnoses:  Dehydration  Nausea and vomiting, unspecified vomiting type  Pneumonia due to infectious organism, unspecified laterality, unspecified part of lung  Enteritis    Clinical Impression: 1. Pneumonia due to infectious organism, unspecified laterality, unspecified part of lung   2. Dehydration   3. Nausea and vomiting, unspecified vomiting type   4. Enteritis     Disposition: Admit  This note was prepared with assistance of Dragon voice recognition software. Occasional wrong-word or sound-a-like substitutions may have occurred due to the inherent limitations of voice recognition software.     Keyron Pokorski, Lonni PARAS, MD 06/20/24 [phone]

## 2024-06-20 NOTE — ED Triage Notes (Signed)
 Body aches, nausea, poor appetite for 3 days. Hx of liver cancer and is not undergoing treatment.  Pt reports bleeding gums for a year.  Interpreter Randall [phone]

## 2024-06-20 NOTE — ED Notes (Signed)
 Patient transported to CT

## 2024-06-21 ENCOUNTER — Other Ambulatory Visit: Payer: Self-pay

## 2024-06-21 DIAGNOSIS — Z515 Encounter for palliative care: Secondary | ICD-10-CM

## 2024-06-21 LAB — COMPREHENSIVE METABOLIC PANEL WITH GFR
ALT: 89 U/L — ABNORMAL HIGH (ref 0–44)
AST: 212 U/L — ABNORMAL HIGH (ref 15–41)
Albumin: 2.7 g/dL — ABNORMAL LOW (ref 3.5–5.0)
Alkaline Phosphatase: 100 U/L (ref 38–126)
Anion gap: 9 (ref 5–15)
BUN: 6 mg/dL (ref 6–20)
CO2: 20 mmol/L — ABNORMAL LOW (ref 22–32)
Calcium: 8.1 mg/dL — ABNORMAL LOW (ref 8.9–10.3)
Chloride: 108 mmol/L (ref 98–111)
Creatinine, Ser: 0.45 mg/dL (ref 0.44–1.00)
GFR, Estimated: >60 mL/min (ref >60)
Glucose, Bld: 81 mg/dL (ref 70–99)
Potassium: 3.9 mmol/L (ref 3.5–5.1)
Sodium: 137 mmol/L (ref 135–145)
Total Bilirubin: 1.8 mg/dL — ABNORMAL HIGH (ref 0.0–1.2)
Total Protein: 5.9 g/dL — ABNORMAL LOW (ref 6.5–8.1)

## 2024-06-21 LAB — HEMOGLOBIN AND HEMATOCRIT, BLOOD
HCT: 24.5 % — ABNORMAL LOW (ref 36.0–46.0)
Hemoglobin: 8 g/dL — ABNORMAL LOW (ref 12.0–15.0)

## 2024-06-21 LAB — CBC
HCT: 20.6 % — ABNORMAL LOW (ref 36.0–46.0)
Hemoglobin: 6.7 g/dL — CL (ref 12.0–15.0)
MCH: 31.2 pg (ref 26.0–34.0)
MCHC: 32.5 g/dL (ref 30.0–36.0)
MCV: 95.8 fL (ref 80.0–100.0)
Platelets: 193 K/uL (ref 150–400)
RBC: 2.15 MIL/uL — ABNORMAL LOW (ref 3.87–5.11)
RDW: 25.6 % — ABNORMAL HIGH (ref 11.5–15.5)
WBC: 4.7 K/uL (ref 4.0–10.5)
nRBC: 0 % (ref 0.0–0.2)

## 2024-06-21 LAB — PREPARE RBC (CROSSMATCH)

## 2024-06-21 MED ORDER — SODIUM CHLORIDE 0.9 % IV SOLN: INTRAVENOUS | Status: AC

## 2024-06-21 MED ORDER — SODIUM CHLORIDE 0.9% IV SOLUTION: Freq: Once | INTRAVENOUS | Status: AC

## 2024-06-21 MED ORDER — ONDANSETRON HCL 4 MG/2ML IJ SOLN
4.0000 mg | Freq: Four times a day (QID) | INTRAMUSCULAR | Status: DC
  Administered 2024-06-21 – 2024-06-22 (×5): 4 mg via INTRAVENOUS
  Filled 2024-06-21 (×5): qty 2

## 2024-06-21 NOTE — ED Notes (Signed)
 Turned off pts pump as IVF were complete. She denied any complaints and no needs were identified. Son at bedside. Pt resting comfortably and in no apparent distress. I informed pt of the delay in waiting for a bed.

## 2024-06-21 NOTE — Plan of Care (Signed)
°  Daily Progress Note   Patient Name: Christina Gutierrez       Date: 06/21/2024 DOB: 07-17-1975  Age: 48 y.o. MRN#: [id number] Attending Physician: Raenelle Coria, MD Primary Care Physician: [name], MD Admit Date: 06/20/2024 Length of Stay: 0 days  Discussed care with primary hospitalist and [facility] liaison today. Patient was receiving hospice support at home through [facility]. [facility] liaison spoke with patient regarding care planning. Patient to be admitted overnight for appropriate medical workup and then will be discharged home tomorrow with [facility] planning to follow up with patient at home to continue education about hospice philosophy. Discussed with hospitalist and [facility] liaison and determined no acute PMT needs at this time so will cancel consult. Please reconsult if acute need a rise. Thank you.   Tinnie Radar, DO Palliative Care Provider PMT # [phone]  No Charge Note

## 2024-06-21 NOTE — Plan of Care (Signed)

## 2024-06-21 NOTE — Progress Notes (Signed)
 PROGRESS NOTE    Christina Gutierrez  FMW:[id number] DOB: [birth year] DOA: 06/20/2024 PCP: [name], MD    Brief Narrative:  48 year old with metastatic hepatocellular carcinoma, exhausted treatment and currently at home with hospice support came to the emergency room about 2 days of not eating, feeling dehydrated and vomiting everything.  In the emergency room normotensive.  On room air.  Elevated LFTs consistent with parenchymal involvement of the tumor.  Chest x-ray with bibasilar atelectasis.  CT scan abdomen pelvis with known liver tumor, basilar atelectasis versus lymphangitic spread of the  tumor.  Admitted due to significant symptoms.  Subjective: Patient seen and examined multiple times today.  Most of the time her husband at the bedside.  Later on her son and daughter-in-law also arrived at the bedside. Case discussed with palliative care team, hospice team.  Used video interpreter and discussed in detail with patient and family. Patient complains of having nausea, what ever she ate in the morning Jell-O and few bites of food came out while transferring from ER to the floor.  Currently denies any pain.  Patient tells me that morphine  is helping control her pain under acceptable levels.  Patient tells me that she gets dehydrated as why she comes to the hospital. Patient and family has understanding that she has incurable disease, does not have understanding of end stage disease. Hemoglobin 6.7, they consented for transfusion.   Assessment & Plan:   Intractable nausea, incurable hepatocellular carcinoma exhausted treatment, cancer related pain: Still symptomatic. Zofran  4 mg every 6 hours, scheduled Compazine  for breakthrough nausea Pain medication, MS Contin  long-acting 15 mg twice daily, short acting morphine  for breakthrough pain. Continue lactulose .  Continue maintenance fluid today. Palliative care team as well as hospice team to follow-up. Patient will need more  education, information regarding end-of-life care and follow-up at hospice.  Possible pneumonia: Patient does not have any evidence of bacterial pneumonia.  Will discontinue antibiotics.  Anemia of chronic disease: Hemoglobin 6.7.  Agreeable to transfusion today.  1 unit PRBC transfusion.   Goal of care: Uncurable liver cancer, persistently symptomatic.  Followed by hospice.  Symptomatic management, nausea management and hydration.  Further goal of care as per hospice.      DVT prophylaxis: Place and maintain sequential compression device Start: 06/21/24 1245 Place and maintain sequential compression device Start: 06/20/24 1625   Code Status: Full code Family Communication: Husband at the bedside Disposition Plan: Status is: Observation The patient will require care spanning > 2 midnights and should be moved to inpatient because: Blood transfusions, IV fluids     Consultants:  Palliative care Hospice  Procedures:  None  Antimicrobials:  Rocephin  azithromycin  12/15-12/16     Objective: Vitals:   06/21/24 0926 06/21/24 1000 06/21/24 1100 06/21/24 1132  BP:  97/66 (!) 100/58 101/61  Pulse:  67 72 77  Resp:  11 13 18   Temp: 98.2 F (36.8 C)   97.8 F (36.6 C)  TempSrc: Oral   Oral  SpO2:  94% 92% 94%  Weight:      Height:        Intake/Output Summary (Last 24 hours) at 06/21/2024 1312 Last data filed at 06/20/2024 1835 Gross per 24 hour  Intake 2350 ml  Output --  Net 2350 ml   Filed Weights   06/20/24 1201  Weight: 46 kg    Examination:  General exam: Appears calm and comfortable.  Sick looking.  Lethargic. Respiratory system: Clear to auscultation. Respiratory effort normal.  No  added sounds. Cardiovascular system: S1 & S2 heard, RRR.  No pedal edema. Gastrointestinal system: Abdomen is nondistended, soft and nontender. No organomegaly or masses felt. Normal bowel sounds heard. Central nervous system: Alert and oriented. No focal neurological  deficits.  Grossly weak.   Data Reviewed: I have personally reviewed following labs and imaging studies  CBC: Recent Labs  Lab 06/20/24 1227 06/21/24 0539  WBC 5.7 4.7  HGB 8.0* 6.7*  HCT 24.4* 20.6*  MCV 94.6 95.8  PLT 230 193   Basic Metabolic Panel: Recent Labs  Lab 06/20/24 1227 06/21/24 0539  NA 133* 137  K 4.3 3.9  CL 98 108  CO2 22 20*  GLUCOSE 101* 81  BUN 6 6  CREATININE 0.52 0.45  CALCIUM 9.9 8.1*   GFR: Estimated Creatinine Clearance: 63.1 mL/min (by C-G formula based on SCr of 0.45 mg/dL). Liver Function Tests: Recent Labs  Lab 06/20/24 1227 06/21/24 0539  AST 254* 212*  ALT 109* 89*  ALKPHOS 134* 100  BILITOT 2.4* 1.8*  PROT 7.5 5.9*  ALBUMIN 3.2* 2.7*   Recent Labs  Lab 06/20/24 1227  LIPASE 11   Recent Labs  Lab 06/20/24 1335  AMMONIA 74*   Coagulation Profile: Recent Labs  Lab 06/20/24 1336  INR 1.5*   Cardiac Enzymes: Recent Labs  Lab 06/20/24 1326  CKTOTAL 13*   BNP (last 3 results) No results for input(s): PROBNP in the last 8760 hours. HbA1C: No results for input(s): HGBA1C in the last 72 hours. CBG: No results for input(s): GLUCAP in the last 168 hours. Lipid Profile: No results for input(s): CHOL, HDL, LDLCALC, TRIG, CHOLHDL, LDLDIRECT in the last 72 hours. Thyroid  Function Tests: No results for input(s): TSH, T4TOTAL, FREET4, T3FREE, THYROIDAB in the last 72 hours. Anemia Panel: No results for input(s): VITAMINB12, FOLATE, FERRITIN, TIBC, IRON, RETICCTPCT in the last 72 hours. Sepsis Labs: No results for input(s): PROCALCITON, LATICACIDVEN in the last 168 hours.  Recent Results (from the past 240 hours)  Resp panel by RT-PCR (RSV, Flu A&B, Covid) Anterior Nasal Swab     Status: None   Collection Time: 06/20/24  1:10 PM   Specimen: Anterior Nasal Swab  Result Value Ref Range Status   SARS Coronavirus 2 by RT PCR NEGATIVE NEGATIVE Final    Comment: (NOTE) SARS-CoV-2  target nucleic acids are NOT DETECTED.  The SARS-CoV-2 RNA is generally detectable in upper respiratory specimens during the acute phase of infection. The lowest concentration of SARS-CoV-2 viral copies this assay can detect is 138 copies/mL. A negative result does not preclude SARS-Cov-2 infection and should not be used as the sole basis for treatment or other patient management decisions. A negative result may occur with  improper specimen collection/handling, submission of specimen other than nasopharyngeal swab, presence of viral mutation(s) within the areas targeted by this assay, and inadequate number of viral copies(<138 copies/mL). A negative result must be combined with clinical observations, patient history, and epidemiological information. The expected result is Negative.  Fact Sheet for Patients:  bloggercourse.com  Fact Sheet for Healthcare Providers:  seriousbroker.it  This test is no t yet approved or cleared by the [place]  FDA and  has been authorized for detection and/or diagnosis of SARS-CoV-2 by FDA under an Emergency Use Authorization (EUA). This EUA will remain  in effect (meaning this test can be used) for the duration of the COVID-19 declaration under Section 564(b)(1) of the Act, 21 U.S.C.section 360bbb-3(b)(1), unless the authorization is terminated  or revoked sooner.  Influenza A by PCR NEGATIVE NEGATIVE Final   Influenza B by PCR NEGATIVE NEGATIVE Final    Comment: (NOTE) The Xpert Xpress SARS-CoV-2/FLU/RSV plus assay is intended as an aid in the diagnosis of influenza from Nasopharyngeal swab specimens and should not be used as a sole basis for treatment. Nasal washings and aspirates are unacceptable for Xpert Xpress SARS-CoV-2/FLU/RSV testing.  Fact Sheet for Patients: bloggercourse.com  Fact Sheet for Healthcare  Providers: seriousbroker.it  This test is not yet approved or cleared by the [place]  FDA and has been authorized for detection and/or diagnosis of SARS-CoV-2 by FDA under an Emergency Use Authorization (EUA). This EUA will remain in effect (meaning this test can be used) for the duration of the COVID-19 declaration under Section 564(b)(1) of the Act, 21 U.S.C. section 360bbb-3(b)(1), unless the authorization is terminated or revoked.     Resp Syncytial Virus by PCR NEGATIVE NEGATIVE Final    Comment: (NOTE) Fact Sheet for Patients: bloggercourse.com  Fact Sheet for Healthcare Providers: seriousbroker.it  This test is not yet approved or cleared by the [place]  FDA and has been authorized for detection and/or diagnosis of SARS-CoV-2 by FDA under an Emergency Use Authorization (EUA). This EUA will remain in effect (meaning this test can be used) for the duration of the COVID-19 declaration under Section 564(b)(1) of the Act, 21 U.S.C. section 360bbb-3(b)(1), unless the authorization is terminated or revoked.  Performed at [facility], [address], [place], [place] [address]          Radiology Studies: DG Chest Portable 1 View Result Date: 06/20/2024 CLINICAL DATA:  Fatigue. EXAM: PORTABLE CHEST 1 VIEW COMPARISON:  Chest radiograph dated 04/16/2024. FINDINGS: Right-sided Port-A-Cath with tip at the cavoatrial junction. There is shallow inspiration with bibasilar atelectasis. Pneumonia is not excluded no pleural effusion pneumothorax. Stable cardiac silhouette no acute osseous pathology. IMPRESSION: Shallow inspiration with bibasilar atelectasis. Pneumonia is not excluded. Electronically Signed   By: Vanetta Chou M.D.   On: 06/20/2024 14:04   CT ABDOMEN PELVIS W CONTRAST Result Date: 06/20/2024 EXAM: CT ABDOMEN AND PELVIS WITH CONTRAST 06/20/2024 01:23:05 PM  TECHNIQUE: CT of the abdomen and pelvis was performed with the administration of 100 mL of iohexol  (OMNIPAQUE ) 300 MG/ML solution. Multiplanar reformatted images are provided for review. Automated exposure control, iterative reconstruction, and/or weight-based adjustment of the mA/kV was utilized to reduce the radiation dose to as low as reasonably achievable. COMPARISON: Prior study dated 05/12/2024. CLINICAL HISTORY: Emesis with known masses in abdomen pushing on stomach. Rule out obstruction. Body aches, nausea, and poor appetite for 3 days. History of liver cancer, not currently undergoing treatment. FINDINGS: LOWER CHEST: Coarse and somewhat nodular interstitial changes in the lung bases would be suspicious for lymphangitis tumor spread. Infectious or inflammatory etiologies could also cause this. Calcified granuloma in the left lung base. Small right pleural effusion with basilar atelectasis. Cardiac enlargement. LIVER: There is diffuse heterogeneous enlargement of the liver, which is filled with heterogeneous and poorly defined tumor masses replacing the hepatic parenchyma. The largest mass in the right lobe, it has been measured for correlation and today measures 14.1 cm, compared with 15.4 cm previously. This likely indicates no change in size. Abdominal contents are displaced by the enlarged liver. Small subcapsular fluid along the right lobe of the liver may be mildly increased since prior study. This is probably reactive, but could possibly indicate subcapsular hematoma. Based on the size of the liver, the lesions would be at risk for  rupture. GALLBLADDER AND BILE DUCTS: Cholelithiasis with contracted gallbladder. No bile duct dilatation. SPLEEN: The spleen size is normal. There is a subcentimeter hyperattenuating lesion in the spleen probably representing hemangioma. PANCREAS: No acute abnormality. ADRENAL GLANDS: No acute abnormality. KIDNEYS, URETERS AND BLADDER: The bladder and kidneys are  unremarkable. No stones in the kidneys or ureters. No hydronephrosis. No perinephric or periureteral stranding. GI AND BOWEL: Stomach demonstrates no acute abnormality. The ascending colon and splenic flexure, as well as some loops of small bowel, demonstrate wall dilatation. This could represent portal hypertensive colopathy or enterocolitis. No pneumatosis. There is no bowel obstruction. PERITONEUM AND RETROPERITONEUM: Small amount of free fluid in the abdomen and pelvis likely represents ascites. No increased density to suggest free air hemorrhage. VASCULATURE: Multiple upper abdominal varices are present. Mesenteric and left lower quadrant varices. Umbilical vein varices. The aorta is normal in caliber. LYMPH NODES: No significant retroperitoneal lymphadenopathy. REPRODUCTIVE ORGANS: The uterus is somewhat enlarged, possibly indicating uterine fibroids. No abnormal adnexal masses. BONES AND SOFT TISSUES: No acute osseous abnormality. No focal soft tissue abnormality. IMPRESSION: 1. Diffuse heterogeneous hepatic involvement by multifocal tumor with marked hepatomegaly; dominant right lobe lesion 14.1 cm, not significantly changed from 15.4 cm previously. No evidence of new metastatic disease within the abdomen or pelvis. 2. Suspected portal hypertension with multiple varices and small-volume ascites. 3. Small right hepatic subcapsular fluid, favored reactive; evolving subcapsular hematoma is less likely but not excluded given tumor burden and risk of rupture. 4. Bowel wall thickening involving the ascending colon, splenic flexure, and segments of small bowel without pneumatosis, which may reflect portal hypertensive colopathy or enterocolitis; no CT evidence of mechanical obstruction. 5. Cholelithiasis with contracted gallbladder. No biliary dilatation. Electronically signed by: Elsie Gravely MD 06/20/2024 01:47 PM EST RP Workstation: [id number]        Scheduled Meds:  sodium chloride    Intravenous Once    lactulose   20 g Oral TID   morphine   15 mg Oral Q12H   ondansetron  (ZOFRAN ) IV  4 mg Intravenous Q6H   tenofovir  alafenamide  25 mg Oral Daily   Continuous Infusions:  sodium chloride  100 mL/hr at 06/21/24 1231     LOS: 0 days    Time spent: 55 minutes    Renato Applebaum, MD [facility]

## 2024-06-21 NOTE — ED Notes (Signed)
 Patients O2 was reading 88, placed on 2L [place] and it is maintaining around 94.  Patient has no complaints of pain at this time. Family is still at bedside. Has no needs expressed at this time.

## 2024-06-21 NOTE — Progress Notes (Signed)
 Christina Gutierrez [address] [facility] Liaison note   Christina Gutierrez is a current hospice patient with a terminal diagnosis of liver cell carcinoma. Patient presented to the ED yesterday afternoon with reported symptoms of abdominal pain, nausea, vomiting and weakness for the last several days at home. [facility] was not made aware prior to transfer. Patient was admitted to [facility] on 12.15.25 for intractable nausea and vomiting. Per Dr. Ephriam Monguilod with [facility], this is a related hospital admission. Patient is a full code.    Met with patient and family at bedside in the ED and again in hospital room. Patient still having some symptoms of nausea but was initially feeling somewhat better after receiving IV fluids this morning. Patient's current Hb level is 6.8 and she has elected to receive a blood transfusion. Discussion was had and questions answered regarding home hospice care and our IPU. Current plan is for home tomorrow morning after blood transfusion.   Pt is inpatient appropriate due to need for IV antibiotics, IV fluids and blood transfusion.    VS: 97.8/77/18   101/61    94% on 2 lpm [place]   I/O: 2.350/ none recorded   Abnormal Labs:  06/20/24  Sodium: 133 (L) Glucose: 101 (H) Alkaline Phosphatase: 134 (H) Albumin: 3.2 (L) AST: 254 (H) ALT: 109 (H) Total Bilirubin: 2.4 (H) RBC: 2.58 (L) Hemoglobin: 8.0 (L) HCT: 24.4 (L) RDW: 26.0 (H) 06/20/24  CK Total: 13 (L) Ammonia: 74 (H) 06/21/24  CO2: 20 (L) Calcium: 8.1 (L) Albumin: 2.7 (L) AST: 212 (H) ALT: 89 (H) Total Protein: 5.9 (L) Total Bilirubin: 1.8 (H) RBC: 2.15 (L) Hemoglobin: 6.7 (LL) (C) HCT: 20.6 (L) RDW: 25.6 (H)  Diagnostics:   CT ABDOMEN AND PELVIS WITH CONTRAST 06/20/2024 01:23:05 PM  IMPRESSION: 1. Diffuse heterogeneous hepatic involvement by multifocal tumor with marked hepatomegaly; dominant right lobe lesion 14.1 cm, not significantly changed from 15.4 cm  previously. No evidence of new metastatic disease within the abdomen or pelvis. 2. Suspected portal hypertension with multiple varices and small-volume ascites. 3. Small right hepatic subcapsular fluid, favored reactive; evolving subcapsular hematoma is less likely but not excluded given tumor burden and risk of rupture. 4. Bowel wall thickening involving the ascending colon, splenic flexure, and segments of small bowel without pneumatosis, which may reflect portal hypertensive colopathy or enterocolitis; no CT evidence of mechanical obstruction. 5. Cholelithiasis with contracted gallbladder. No biliary dilatation.  PORTABLE CHEST 1 VIEW COMPARISON:  Chest radiograph dated 04/16/2024.  IMPRESSION: Shallow inspiration with bibasilar atelectasis. Pneumonia is not excluded.  IV/PRN meds: morphine  15 mg tablet PO x2, NaCl 0.9% bolus 1,000 mL IV x2, Rocephin  1g IV x1, Zithromax  500mg  IV x1, NaCl 0.9% 100 mL/hr IV cont., Zofran  4mg  IV x1, morphine  4mg  IV x1   Assessment and Plan (per Raenelle Coria, MD 12.16.25): Assessment & Plan:   Intractable nausea, incurable hepatocellular carcinoma exhausted treatment, cancer related pain: Still symptomatic. Zofran  4 mg every 6 hours, scheduled Compazine  for breakthrough nausea Pain medication, MS Contin  Gutierrez-acting 15 mg twice daily, short acting morphine  for breakthrough pain. Continue lactulose .  Continue maintenance fluid today. Palliative care team as well as hospice team to follow-up. Patient will need more education, information regarding end-of-life care and follow-up at hospice.   Possible pneumonia: Patient does not have any evidence of bacterial pneumonia.  Will discontinue antibiotics.   Anemia of chronic disease: Hemoglobin 6.7.  Agreeable to transfusion today.  1 unit PRBC transfusion.   Goals of care: Full code,  continued discussions about hospice    Discharge Planning: Home with hospice when medically stable, most likely  tomorrow   Family Contact: patient speaks for herself but husband at bedside also  IDT: updated   Medication List and Transfer Summary uploaded to patient chart.   Should patient need ambulance transport at discharge, please call [facility] as we contract with them for our active hospice patients.   Please call with any hospice questions or concerns.   Thank you, Eleanor Nail, [facility] Liaison [phone]

## 2024-06-22 ENCOUNTER — Other Ambulatory Visit (HOSPITAL_COMMUNITY): Payer: Self-pay

## 2024-06-22 LAB — BPAM RBC
Blood Product Expiration Date: 202601132359
ISSUE DATE / TIME: 202512161437
Unit Type and Rh: 5100

## 2024-06-22 LAB — TYPE AND SCREEN
ABO/RH(D): O POS
Antibody Screen: NEGATIVE
Unit division: 0

## 2024-06-22 MED ORDER — BOOST / RESOURCE BREEZE PO LIQD CUSTOM
1.0000 | Freq: Three times a day (TID) | ORAL | Status: DC
  Administered 2024-06-22: 20:00:00 1 via ORAL

## 2024-06-22 MED ORDER — SENNOSIDES-DOCUSATE SODIUM 8.6-50 MG PO TABS
1.0000 | ORAL_TABLET | Freq: Two times a day (BID) | ORAL | 2 refills | Status: DC
  Filled 2024-06-22: qty 60, 30d supply, fill #0

## 2024-06-22 MED ORDER — ONDANSETRON HCL 8 MG PO TABS
8.0000 mg | ORAL_TABLET | Freq: Three times a day (TID) | ORAL | 0 refills | Status: DC | PRN
  Filled 2024-06-22: qty 60, 20d supply, fill #0

## 2024-06-22 MED ORDER — PROCHLORPERAZINE MALEATE 10 MG PO TABS
10.0000 mg | ORAL_TABLET | Freq: Four times a day (QID) | ORAL | 1 refills | Status: DC | PRN
  Filled 2024-06-22: qty 30, 8d supply, fill #0

## 2024-06-22 MED ORDER — OMEPRAZOLE 40 MG PO CPDR
40.0000 mg | DELAYED_RELEASE_CAPSULE | Freq: Every day | ORAL | 2 refills | Status: DC
  Filled 2024-06-22: qty 30, 30d supply, fill #0

## 2024-06-22 MED ORDER — ENSURE PLUS HIGH PROTEIN PO LIQD: 237.0000 mL | Freq: Two times a day (BID) | ORAL | Status: DC

## 2024-06-22 MED ORDER — POLYETHYLENE GLYCOL 3350 17 GM/SCOOP PO POWD
17.0000 g | Freq: Every day | ORAL | 0 refills | Status: DC
  Filled 2024-06-22: qty 238, 14d supply, fill #0

## 2024-06-22 NOTE — Progress Notes (Signed)
 Discharge medication delivered to patient at the bedside DOB verified with armband

## 2024-06-22 NOTE — Discharge Summary (Signed)
 Physician Discharge Summary  Christina Gutierrez FMW:[id number] DOB: 15-Feb-1976 DOA: 06/20/2024  PCP: [name], MD  Admit date: 06/20/2024 Discharge date: 06/22/2024  Admitted From: Home Disposition: Home with hospice  Recommendations for Outpatient Follow-up:  As per hospice provider  Discharge Condition: Fair CODE STATUS: Full code Diet recommendation: Regular diet as tolerated, nutritional supplements  Discharge summary: 48 year old with hepatitis B, hepatocellular carcinoma, exhausted treatment and currently at home with hospice support came to the emergency room about 2 days of not eating, feeling dehydrated and vomiting everything.  In the emergency room normotensive.  On room air.  Elevated LFTs consistent with parenchymal involvement of the tumor.  Chest x-ray with bibasilar atelectasis.  CT scan abdomen pelvis with known liver tumor, basilar atelectasis versus lymphangitic spread of the  tumor.  Admitted due to significant symptoms. Patient was treated symptomatically.  Also given 1 unit of PRBC.  Symptom control achieved.  Going home with hospice.    Assessment & Plan:   Intractable nausea, incurable hepatocellular carcinoma exhausted treatment, cancer related pain: Treated with IV fluids and nausea medications.  She is able to achieve symptom control today. Patient will be using Zofran  and Phenergan  alternatively to control her nausea at home. Pain medication, MS Contin  long-acting 15 mg twice daily, short acting morphine  for breakthrough pain. Continue Colace and MiraLAX .   Patient will need more education, information regarding end-of-life care and follow-up at hospice. Patient has adequate support system at home.  She is able to go home today. CODE STATUS, hospitalization and symptom management to be discussed by hospice team at home.   Possible pneumonia: Patient does not have any evidence of bacterial pneumonia.  Will discontinue antibiotics.   Anemia of  chronic disease: Hemoglobin 6.7-given 1 unit of transfusion.  Hemoglobin 8.  No evidence of active bleeding.     Goal of care: Uncurable liver cancer, persistently symptomatic.  Followed by hospice.  Symptomatic management, nausea management and hydration.  Further goal of care as per hospice. Patient is still seeking to come to emergency room for symptom control. Currently stable to transition home.  Oxygen, nebulizer and pain medication to be prescribed by hospice team.  Discharge Diagnoses:  Principal Problem:   Intractable nausea and vomiting    Discharge Instructions  Discharge Instructions     Diet general   Complete by: As directed    As tolerated   Increase activity slowly   Complete by: As directed       Allergies as of 06/22/2024   No Known Allergies      Medication List     STOP taking these medications    promethazine  12.5 MG tablet Commonly known as: PHENERGAN        TAKE these medications    acetaminophen  325 MG tablet Commonly known as: TYLENOL  Take 2 tablets (650 mg total) by mouth every 6 (six) hours as needed for mild pain (pain score 1-3) or fever (or Fever >/= 101).   ALPRAZolam  0.5 MG tablet Commonly known as: XANAX  Take 0.5 mg by mouth daily.   lidocaine -prilocaine  cream Commonly known as: EMLA  Aplique segn sea necesario. (Apply topically as needed.)   morphine  15 MG tablet Commonly known as: MSIR Take 1 tablet (15 mg total) by mouth every 3 (three) hours as needed for severe pain (pain score 7-10).   morphine  15 MG 12 hr tablet Commonly known as: MS Contin  Take 1 tablet (15 mg total) by mouth every 12 (twelve) hours.   omeprazole  40 MG capsule  Commonly known as: PRILOSEC Tome 1 cpsula (40 mg en total) por va oral diariamente. (Take 1 capsule (40 mg total) by mouth daily.)   ondansetron  8 MG tablet Commonly known as: ZOFRAN  Take 1 tablet (8 mg total) by mouth every 8 (eight) hours as needed for nausea or vomiting.    polyethylene glycol powder 17 GM/SCOOP powder Commonly known as: GLYCOLAX /MIRALAX  Take 17 g by mouth daily. Dissolve 1 capful (17g) in 4-8 ounces of liquid and take by mouth daily.   prochlorperazine  10 MG tablet Commonly known as: COMPAZINE  Take 1 tablet (10 mg total) by mouth every 6 (six) hours as needed for refractory nausea / vomiting. What changed: reasons to take this   Stool Softener/Laxative 50-8.6 MG tablet Generic drug: senna-docusate Take 1 tablet by mouth 2 (two) times daily.   Vemlidy  25 MG tablet Generic drug: tenofovir  alafenamide Tome 1 tableta (25 mg en total) por va oral diariamente. (Take 1 tablet (25 mg total) by mouth daily.)        Allergies[1]  Consultations: [facility]   Procedures/Studies: DG Chest Portable 1 View Result Date: 06/20/2024 CLINICAL DATA:  Fatigue. EXAM: PORTABLE CHEST 1 VIEW COMPARISON:  Chest radiograph dated 04/16/2024. FINDINGS: Right-sided Port-A-Cath with tip at the cavoatrial junction. There is shallow inspiration with bibasilar atelectasis. Pneumonia is not excluded no pleural effusion pneumothorax. Stable cardiac silhouette no acute osseous pathology. IMPRESSION: Shallow inspiration with bibasilar atelectasis. Pneumonia is not excluded. Electronically Signed   By: Vanetta Chou M.D.   On: 06/20/2024 14:04   CT ABDOMEN PELVIS W CONTRAST Result Date: 06/20/2024 EXAM: CT ABDOMEN AND PELVIS WITH CONTRAST 06/20/2024 01:23:05 PM TECHNIQUE: CT of the abdomen and pelvis was performed with the administration of 100 mL of iohexol  (OMNIPAQUE ) 300 MG/ML solution. Multiplanar reformatted images are provided for review. Automated exposure control, iterative reconstruction, and/or weight-based adjustment of the mA/kV was utilized to reduce the radiation dose to as low as reasonably achievable. COMPARISON: Prior study dated 05/12/2024. CLINICAL HISTORY: Emesis with known masses in abdomen pushing on stomach. Rule out obstruction.  Body aches, nausea, and poor appetite for 3 days. History of liver cancer, not currently undergoing treatment. FINDINGS: LOWER CHEST: Coarse and somewhat nodular interstitial changes in the lung bases would be suspicious for lymphangitis tumor spread. Infectious or inflammatory etiologies could also cause this. Calcified granuloma in the left lung base. Small right pleural effusion with basilar atelectasis. Cardiac enlargement. LIVER: There is diffuse heterogeneous enlargement of the liver, which is filled with heterogeneous and poorly defined tumor masses replacing the hepatic parenchyma. The largest mass in the right lobe, it has been measured for correlation and today measures 14.1 cm, compared with 15.4 cm previously. This likely indicates no change in size. Abdominal contents are displaced by the enlarged liver. Small subcapsular fluid along the right lobe of the liver may be mildly increased since prior study. This is probably reactive, but could possibly indicate subcapsular hematoma. Based on the size of the liver, the lesions would be at risk for rupture. GALLBLADDER AND BILE DUCTS: Cholelithiasis with contracted gallbladder. No bile duct dilatation. SPLEEN: The spleen size is normal. There is a subcentimeter hyperattenuating lesion in the spleen probably representing hemangioma. PANCREAS: No acute abnormality. ADRENAL GLANDS: No acute abnormality. KIDNEYS, URETERS AND BLADDER: The bladder and kidneys are unremarkable. No stones in the kidneys or ureters. No hydronephrosis. No perinephric or periureteral stranding. GI AND BOWEL: Stomach demonstrates no acute abnormality. The ascending colon and splenic flexure, as well as some  loops of small bowel, demonstrate wall dilatation. This could represent portal hypertensive colopathy or enterocolitis. No pneumatosis. There is no bowel obstruction. PERITONEUM AND RETROPERITONEUM: Small amount of free fluid in the abdomen and pelvis likely represents ascites. No  increased density to suggest free air hemorrhage. VASCULATURE: Multiple upper abdominal varices are present. Mesenteric and left lower quadrant varices. Umbilical vein varices. The aorta is normal in caliber. LYMPH NODES: No significant retroperitoneal lymphadenopathy. REPRODUCTIVE ORGANS: The uterus is somewhat enlarged, possibly indicating uterine fibroids. No abnormal adnexal masses. BONES AND SOFT TISSUES: No acute osseous abnormality. No focal soft tissue abnormality. IMPRESSION: 1. Diffuse heterogeneous hepatic involvement by multifocal tumor with marked hepatomegaly; dominant right lobe lesion 14.1 cm, not significantly changed from 15.4 cm previously. No evidence of new metastatic disease within the abdomen or pelvis. 2. Suspected portal hypertension with multiple varices and small-volume ascites. 3. Small right hepatic subcapsular fluid, favored reactive; evolving subcapsular hematoma is less likely but not excluded given tumor burden and risk of rupture. 4. Bowel wall thickening involving the ascending colon, splenic flexure, and segments of small bowel without pneumatosis, which may reflect portal hypertensive colopathy or enterocolitis; no CT evidence of mechanical obstruction. 5. Cholelithiasis with contracted gallbladder. No biliary dilatation. Electronically signed by: Elsie Gravely MD 06/20/2024 01:47 PM EST RP Workstation: [id number]   (Echo, Carotid, EGD, Colonoscopy, ERCP)    Subjective: Patient seen and examined.  Her daughter-in-law at the bedside.  Spanish video interpreter was used. Patient was very grateful to care provided, fluid and blood provided overnight.  Patient tells me that she can reasonably eat today given her liver issues.  Pain is controlled.  She thinks nebulizer will help and also oxygen may help with congestion.  This was communicated with hospice team.  Patient and family feels comfortable going home. Patient tells me that she would like to come back to hospital if  she gets into problems.  Advised patient to seek help if uncomfortable and also call hospice when she has any trouble at home.   Discharge Exam: Vitals:   06/21/24 1942 06/22/24 0400  BP: 105/63 109/68  Pulse: 77 80  Resp: 18 18  Temp: 99.2 F (37.3 C) 98.4 F (36.9 C)  SpO2: 96% 96%   Vitals:   06/21/24 1459 06/21/24 1725 06/21/24 1942 06/22/24 0400  BP: (!) 87/51 101/60 105/63 109/68  Pulse: 78 77 77 80  Resp: 16 16 18 18   Temp: 98 F (36.7 C) 97.7 F (36.5 C) 99.2 F (37.3 C) 98.4 F (36.9 C)  TempSrc: Oral Oral Oral Oral  SpO2: 96% 97% 96% 96%  Weight:      Height:        General: Pt is alert, awake, not in acute distress Chronically sick looking.  Frail and debilitated.  Pleasant and interactive. Cardiovascular: RRR, S1/S2 +, no rubs, no gallops Respiratory: CTA bilaterally, no wheezing, no rhonchi, some conducted upper airway sounds. Abdominal: Soft, NT, ND, bowel sounds + Extremities: no edema, no cyanosis    The results of significant diagnostics from this hospitalization (including imaging, microbiology, ancillary and laboratory) are listed below for reference.     Microbiology: Recent Results (from the past 240 hours)  Resp panel by RT-PCR (RSV, Flu A&B, Covid) Anterior Nasal Swab     Status: None   Collection Time: 06/20/24  1:10 PM   Specimen: Anterior Nasal Swab  Result Value Ref Range Status   SARS Coronavirus 2 by RT PCR NEGATIVE NEGATIVE Final    Comment: (  NOTE) SARS-CoV-2 target nucleic acids are NOT DETECTED.  The SARS-CoV-2 RNA is generally detectable in upper respiratory specimens during the acute phase of infection. The lowest concentration of SARS-CoV-2 viral copies this assay can detect is 138 copies/mL. A negative result does not preclude SARS-Cov-2 infection and should not be used as the sole basis for treatment or other patient management decisions. A negative result may occur with  improper specimen collection/handling, submission  of specimen other than nasopharyngeal swab, presence of viral mutation(s) within the areas targeted by this assay, and inadequate number of viral copies(<138 copies/mL). A negative result must be combined with clinical observations, patient history, and epidemiological information. The expected result is Negative.  Fact Sheet for Patients:  bloggercourse.com  Fact Sheet for Healthcare Providers:  seriousbroker.it  This test is no t yet approved or cleared by the [place]  FDA and  has been authorized for detection and/or diagnosis of SARS-CoV-2 by FDA under an Emergency Use Authorization (EUA). This EUA will remain  in effect (meaning this test can be used) for the duration of the COVID-19 declaration under Section 564(b)(1) of the Act, 21 U.S.C.section 360bbb-3(b)(1), unless the authorization is terminated  or revoked sooner.       Influenza A by PCR NEGATIVE NEGATIVE Final   Influenza B by PCR NEGATIVE NEGATIVE Final    Comment: (NOTE) The Xpert Xpress SARS-CoV-2/FLU/RSV plus assay is intended as an aid in the diagnosis of influenza from Nasopharyngeal swab specimens and should not be used as a sole basis for treatment. Nasal washings and aspirates are unacceptable for Xpert Xpress SARS-CoV-2/FLU/RSV testing.  Fact Sheet for Patients: bloggercourse.com  Fact Sheet for Healthcare Providers: seriousbroker.it  This test is not yet approved or cleared by the [place]  FDA and has been authorized for detection and/or diagnosis of SARS-CoV-2 by FDA under an Emergency Use Authorization (EUA). This EUA will remain in effect (meaning this test can be used) for the duration of the COVID-19 declaration under Section 564(b)(1) of the Act, 21 U.S.C. section 360bbb-3(b)(1), unless the authorization is terminated or revoked.     Resp Syncytial Virus by PCR NEGATIVE NEGATIVE  Final    Comment: (NOTE) Fact Sheet for Patients: bloggercourse.com  Fact Sheet for Healthcare Providers: seriousbroker.it  This test is not yet approved or cleared by the [place]  FDA and has been authorized for detection and/or diagnosis of SARS-CoV-2 by FDA under an Emergency Use Authorization (EUA). This EUA will remain in effect (meaning this test can be used) for the duration of the COVID-19 declaration under Section 564(b)(1) of the Act, 21 U.S.C. section 360bbb-3(b)(1), unless the authorization is terminated or revoked.  Performed at [facility], [address] [place], [place] [address]      Labs: BNP (last 3 results) No results for input(s): BNP in the last 8760 hours. Basic Metabolic Panel: Recent Labs  Lab 06/20/24 1227 06/21/24 0539  NA 133* 137  K 4.3 3.9  CL 98 108  CO2 22 20*  GLUCOSE 101* 81  BUN 6 6  CREATININE 0.52 0.45  CALCIUM 9.9 8.1*   Liver Function Tests: Recent Labs  Lab 06/20/24 1227 06/21/24 0539  AST 254* 212*  ALT 109* 89*  ALKPHOS 134* 100  BILITOT 2.4* 1.8*  PROT 7.5 5.9*  ALBUMIN 3.2* 2.7*   Recent Labs  Lab 06/20/24 1227  LIPASE 11   Recent Labs  Lab 06/20/24 1335  AMMONIA 74*   CBC: Recent Labs  Lab 06/20/24 1227 06/21/24 0539  06/21/24 1838  WBC 5.7 4.7  --   HGB 8.0* 6.7* 8.0*  HCT 24.4* 20.6* 24.5*  MCV 94.6 95.8  --   PLT 230 193  --    Cardiac Enzymes: Recent Labs  Lab 06/20/24 1326  CKTOTAL 13*   BNP: Invalid input(s): POCBNP CBG: No results for input(s): GLUCAP in the last 168 hours. D-Dimer No results for input(s): DDIMER in the last 72 hours. Hgb A1c No results for input(s): HGBA1C in the last 72 hours. Lipid Profile No results for input(s): CHOL, HDL, LDLCALC, TRIG, CHOLHDL, LDLDIRECT in the last 72 hours. Thyroid  function studies No results for input(s): TSH, T4TOTAL, T3FREE,  THYROIDAB in the last 72 hours.  Invalid input(s): FREET3 Anemia work up No results for input(s): VITAMINB12, FOLATE, FERRITIN, TIBC, IRON, RETICCTPCT in the last 72 hours. Urinalysis    Component Value Date/Time   COLORURINE YELLOW 06/20/2024 1441   APPEARANCEUR CLEAR 06/20/2024 1441   LABSPEC 1.044 (H) 06/20/2024 1441   PHURINE 7.0 06/20/2024 1441   GLUCOSEU NEGATIVE 06/20/2024 1441   HGBUR NEGATIVE 06/20/2024 1441   BILIRUBINUR NEGATIVE 06/20/2024 1441   KETONESUR NEGATIVE 06/20/2024 1441   PROTEINUR NEGATIVE 06/20/2024 1441   NITRITE NEGATIVE 06/20/2024 1441   LEUKOCYTESUR NEGATIVE 06/20/2024 1441   Sepsis Labs Recent Labs  Lab 06/20/24 1227 06/21/24 0539  WBC 5.7 4.7   Microbiology Recent Results (from the past 240 hours)  Resp panel by RT-PCR (RSV, Flu A&B, Covid) Anterior Nasal Swab     Status: None   Collection Time: 06/20/24  1:10 PM   Specimen: Anterior Nasal Swab  Result Value Ref Range Status   SARS Coronavirus 2 by RT PCR NEGATIVE NEGATIVE Final    Comment: (NOTE) SARS-CoV-2 target nucleic acids are NOT DETECTED.  The SARS-CoV-2 RNA is generally detectable in upper respiratory specimens during the acute phase of infection. The lowest concentration of SARS-CoV-2 viral copies this assay can detect is 138 copies/mL. A negative result does not preclude SARS-Cov-2 infection and should not be used as the sole basis for treatment or other patient management decisions. A negative result may occur with  improper specimen collection/handling, submission of specimen other than nasopharyngeal swab, presence of viral mutation(s) within the areas targeted by this assay, and inadequate number of viral copies(<138 copies/mL). A negative result must be combined with clinical observations, patient history, and epidemiological information. The expected result is Negative.  Fact Sheet for Patients:  bloggercourse.com  Fact Sheet  for Healthcare Providers:  seriousbroker.it  This test is no t yet approved or cleared by the [place]  FDA and  has been authorized for detection and/or diagnosis of SARS-CoV-2 by FDA under an Emergency Use Authorization (EUA). This EUA will remain  in effect (meaning this test can be used) for the duration of the COVID-19 declaration under Section 564(b)(1) of the Act, 21 U.S.C.section 360bbb-3(b)(1), unless the authorization is terminated  or revoked sooner.       Influenza A by PCR NEGATIVE NEGATIVE Final   Influenza B by PCR NEGATIVE NEGATIVE Final    Comment: (NOTE) The Xpert Xpress SARS-CoV-2/FLU/RSV plus assay is intended as an aid in the diagnosis of influenza from Nasopharyngeal swab specimens and should not be used as a sole basis for treatment. Nasal washings and aspirates are unacceptable for Xpert Xpress SARS-CoV-2/FLU/RSV testing.  Fact Sheet for Patients: bloggercourse.com  Fact Sheet for Healthcare Providers: seriousbroker.it  This test is not yet approved or cleared by the [place]  FDA and has been authorized  for detection and/or diagnosis of SARS-CoV-2 by FDA under an Emergency Use Authorization (EUA). This EUA will remain in effect (meaning this test can be used) for the duration of the COVID-19 declaration under Section 564(b)(1) of the Act, 21 U.S.C. section 360bbb-3(b)(1), unless the authorization is terminated or revoked.     Resp Syncytial Virus by PCR NEGATIVE NEGATIVE Final    Comment: (NOTE) Fact Sheet for Patients: bloggercourse.com  Fact Sheet for Healthcare Providers: seriousbroker.it  This test is not yet approved or cleared by the [place]  FDA and has been authorized for detection and/or diagnosis of SARS-CoV-2 by FDA under an Emergency Use Authorization (EUA). This EUA will remain in effect (meaning  this test can be used) for the duration of the COVID-19 declaration under Section 564(b)(1) of the Act, 21 U.S.C. section 360bbb-3(b)(1), unless the authorization is terminated or revoked.  Performed at [facility], [address] [place], [place] [address]      Time coordinating discharge: 40 minutes  SIGNED:   Renato Applebaum, MD  [facility] 06/22/2024, 11:39 AM     [1] No Known Allergies

## 2024-06-22 NOTE — Plan of Care (Addendum)
 Awaiting for confirmation of oxygen delivery before pt dc's.  Will need [facility] for home once delivered ---  Oxygen delivery confirmed.  Transportation called.  Awaiting transport for pt.  All IV's Dc'd, pt received, educated on, and understands AVS.

## 2024-06-22 NOTE — TOC Transition Note (Addendum)
 Transition of Care [facility]) - Discharge Note   Patient Details  Name: Christina Gutierrez MRN: [id number] Date of Birth: Nov 25, 1975  [facility] CM/SW Contact:  Doneta Glenys DASEN, RN Phone Number: 06/22/2024, 2:12 PM   Clinical Narrative:    Per MD patient is ready for discharge home with [facility]. Waiting for [employer] to deliver nebulizer and oxygen. Then will set up transportation with [facility] for home. CM used Julie(interpretor) on speaker phone to verify patient address for transport. CM signing off    Final next level of care: Home w Hospice Care Barriers to Discharge: Barriers Resolved   Patient Goals and CMS Choice Patient states their goals for this hospitalization and ongoing recovery are:: Home with [facility] CMS Medicare.gov Compare Post Acute Care list provided to:: Patient Represenative (must comment) Choice offered to / list presented to : Patient, Adult Children, Spouse Home ownership interest in [facility].provided to:: Adult Children    Discharge Placement                       Discharge Plan and Services Additional resources added to the After Visit Summary for   In-house Referral: Hospice / Palliative Care Discharge Planning Services: CM Consult            DME Arranged: Nebulizer machine, Oxygen DME Agency: [employer] Date DME Agency Contacted: 06/22/24     HH Arranged: NA HH Agency: NA        Social Drivers of Health (SDOH) Interventions SDOH Screenings   Food Insecurity: No Food Insecurity (06/21/2024)  Housing: Low Risk (06/21/2024)  Transportation Needs: No Transportation Needs (06/21/2024)  Utilities: Not At Risk (06/21/2024)  Depression (PHQ2-9): Low Risk (05/20/2024)  Social Connections: Patient Declined (08/29/2023)  Tobacco Use: Low Risk (06/20/2024)     Readmission Risk Interventions    06/22/2024    2:06 PM  Readmission Risk Prevention Plan  Transportation Screening Complete   Medication Review (RN Care Manager) Complete  PCP or Specialist appointment within 3-5 days of discharge Complete  HRI or Home Care Consult Complete  SW Recovery Care/Counseling Consult Complete  Palliative Care Screening Complete  Skilled Nursing Facility Not Applicable

## 2024-06-22 NOTE — TOC Initial Note (Addendum)
 Transition of Care [facility]) - Initial/Assessment Note    Patient Details  Name: Christina Gutierrez MRN: [id number] Date of Birth: 09-Oct-1975  [facility] CM/SW Contact:    Doneta Glenys DASEN, RN  Clinical Narrative:                 Patient is active with [facility].  Expected Discharge Plan: Home w Hospice Care Barriers to Discharge: Continued Medical Work up, Transportation   Patient Goals and CMS Choice Patient states their goals for this hospitalization and ongoing recovery are:: Home with [facility] CMS Medicare.gov Compare Post Acute Care list provided to:: Patient Represenative (must comment) Choice offered to / list presented to : Patient, Adult Children, Spouse [place] ownership interest in [facility].provided to:: Adult Children    Expected Discharge Plan and Services In-house Referral: Hospice / Palliative Care Discharge Planning Services: CM Consult   Living arrangements for the past 2 months: Single Family Home Expected Discharge Date: 06/22/24               DME Arranged: N/A DME Agency: NA       HH Arranged: NA HH Agency: NA        Prior Living Arrangements/Services Living arrangements for the past 2 months: Single Family Home Lives with:: Spouse, Adult Children Patient language and need for interpreter reviewed:: Yes Do you feel safe going back to the place where you live?: Yes      Need for Family Participation in Patient Care: Yes (Comment) Care giver support system in place?: Yes (comment)   Criminal Activity/Legal Involvement Pertinent to Current Situation/Hospitalization: No - Comment as needed  Activities of Daily Living   ADL Screening (condition at time of admission) Independently performs ADLs?: Yes (appropriate for developmental age) Is the patient deaf or have difficulty hearing?: No Does the patient have difficulty seeing, even when wearing glasses/contacts?: No Does the patient have difficulty concentrating,  remembering, or making decisions?: No  Permission Sought/Granted Permission sought to share information with : Case Manager, Family Supports Permission granted to share information with : Yes, Verbal Permission Granted  Share Information with NAME: Teresia Jeral Johann, Emergency Contact  [phone] (Mobile)  View contact details   Patient location Unit: [facility]5 [facility] GEN SURG  Bed: 1530 / 1530-01  Permission granted to share info w AGENCY: [facility]        Emotional Assessment Appearance:: Appears stated age Attitude/Demeanor/Rapport: Gracious Affect (typically observed): Appropriate Orientation: : Oriented to Self, Oriented to Place, Oriented to  Time, Oriented to Situation Alcohol / Substance Use: Not Applicable Psych Involvement: No (comment)  Admission diagnosis:  Dehydration [E86.0] Enteritis [K52.9] Intractable nausea and vomiting [R11.2] Pneumonia due to infectious organism, unspecified laterality, unspecified part of lung [J18.9] Nausea and vomiting, unspecified vomiting type [R11.2] Patient Active Problem List   Diagnosis Date Noted   Protein-calorie malnutrition, severe 05/16/2024   Intractable abdominal pain 05/15/2024   Intractable nausea and vomiting 04/24/2024   Anemia of chronic disease 04/17/2024   Palliative care encounter 04/17/2024   Medication management 04/17/2024   Counseling and coordination of care 04/17/2024   Goals of care, counseling/discussion 04/17/2024   Abdominal pain 03/17/2024   Dehydration 03/14/2024   Gingival bleeding 11/13/2023   Port-A-Cath in place 10/23/2023   Situational insomnia 10/02/2023   Cancer-related pain 08/28/2023   Portal venous hypertension (HCC) 08/23/2023   Intractable pain 08/23/2023   Hepatocellular carcinoma (HCC) 08/22/2023   Hepatic failure (HCC) 08/21/2023   Normocytic anemia 08/21/2023  Transaminitis 08/21/2023   Disseminated varicella 08/21/2023   Hyperammonemia 08/21/2023   Chronic viral hepatitis B without  delta-agent (HCC) 08/21/2023   PCP:  [name], MD Pharmacy:   [facility] - [facility] [address] [place] [address] Phone: [phone] Fax: [phone]     Social Drivers of Health (SDOH) Social History: SDOH Screenings   Food Insecurity: No Food Insecurity (06/21/2024)  Housing: Low Risk (06/21/2024)  Transportation Needs: No Transportation Needs (06/21/2024)  Utilities: Not At Risk (06/21/2024)  Depression (PHQ2-9): Low Risk (05/20/2024)  Social Connections: Patient Declined (08/29/2023)  Tobacco Use: Low Risk (06/20/2024)   SDOH Interventions: Food Insecurity Interventions: Intervention Not Indicated Housing Interventions: Intervention Not Indicated Transportation Interventions: Intervention Not Indicated Utilities Interventions: Intervention Not Indicated   Readmission Risk Interventions    06/22/2024    2:06 PM  Readmission Risk Prevention Plan  Transportation Screening Complete  Medication Review (RN Care Manager) Complete  PCP or Specialist appointment within 3-5 days of discharge Complete  HRI or Home Care Consult Complete  SW Recovery Care/Counseling Consult Complete  Palliative Care Screening Complete  Skilled Nursing Facility Not Applicable

## 2024-06-23 ENCOUNTER — Other Ambulatory Visit: Payer: Self-pay

## 2024-06-23 NOTE — Plan of Care (Signed)
  Problem: Clinical Measurements: Goal: Will remain free from infection Outcome: Progressing Goal: Cardiovascular complication will be avoided Outcome: Progressing   Problem: Nutrition: Goal: Adequate nutrition will be maintained Outcome: Progressing   Problem: Coping: Goal: Level of anxiety will decrease Outcome: Progressing   Problem: Safety: Goal: Ability to remain free from injury will improve Outcome: Progressing

## 2024-06-23 NOTE — TOC Transition Note (Signed)
 Transition of Care [facility]) - Discharge Note   Patient Details  Name: Christina Gutierrez MRN: [id number] Date of Birth: Sep 16, 1975  Transition [facility] Care [facility]) CM/SW Contact:  Doneta Glenys DASEN, RN Phone Number: 06/23/2024, 9:10 AM   Clinical Narrative:    CM called [facility] called for transport home. Face sheet and Medical necessity placed in packet for transport. IP CM signing off.   Final next level of care: Home w Hospice Care Barriers to Discharge: Barriers Resolved   Patient Goals and CMS Choice Patient states their goals for this hospitalization and ongoing recovery are:: Home with [facility] CMS Medicare.gov Compare Post Acute Care list provided to:: Patient Represenative (must comment) Choice offered to / list presented to : Patient, Adult Children, Spouse [place] ownership interest in [facility].provided to:: Adult Children    Discharge Placement                       Discharge Plan and Services Additional resources added to the After Visit Summary for   In-house Referral: Hospice / Palliative Care Discharge Planning Services: CM Consult            DME Arranged: Nebulizer machine, Oxygen DME Agency: [employer] Date DME Agency Contacted: 06/22/24     HH Arranged: NA HH Agency: NA        Social Drivers of Health (SDOH) Interventions SDOH Screenings   Food Insecurity: No Food Insecurity (06/21/2024)  Housing: Low Risk (06/21/2024)  Transportation Needs: No Transportation Needs (06/21/2024)  Utilities: Not At Risk (06/21/2024)  Depression (PHQ2-9): Low Risk (05/20/2024)  Social Connections: Patient Declined (08/29/2023)  Tobacco Use: Low Risk (06/20/2024)     Readmission Risk Interventions    06/22/2024    2:06 PM  Readmission Risk Prevention Plan  Transportation Screening Complete  Medication Review (RN Care Manager) Complete  PCP or Specialist appointment within 3-5 days of discharge Complete  HRI or Home Care Consult Complete  SW  Recovery Care/Counseling Consult Complete  Palliative Care Screening Complete  Skilled Nursing Facility Not Applicable

## 2024-06-23 NOTE — Progress Notes (Signed)
 Christina Gutierrez 15[address] [facility] Liaison note   Christina Gutierrez is a current hospice patient with a terminal diagnosis of liver cell carcinoma. Patient presented to the ED with reported symptoms of abdominal pain, nausea, vomiting and weakness for the last several days at home. [facility] was not made aware prior to transfer. Patient was admitted to [facility] on 12.15.25 for intractable nausea and vomiting. Per Dr. Ephriam Monguilod with [facility], this is a related hospital admission. Patient is a full code.    Met with patient and one family member at bedside. Using the mobile interpreter, liaison discussed with patient the plan to go home today after DME delivery. Patient agreeable and stated that she was feeling better. Advised patient that hospice nurse would visit her at home and requested that she call [facility] in the future if she began having unmanaged symptoms. Patient stated understanding and agreement.    Pt is inpatient appropriate due to need for IV antibiotics, IV fluids and blood transfusion.    VS: 98.3/81/18  105/65   92% on RA   I/O: none recorded   Abnormal Labs: none new   Diagnostics: none new  IV/PRN meds: morphine  15 mg tablet PO x2, Rocephin  1g IV x2, NaCl 0.9% 100 mL/hr IV cont., Zofran  4mg  IV x4, Compazine  10mg  IV x1   Assessment and Plan (per Raenelle Coria, MD 12.17.25): Assessment & Plan:   Intractable nausea, incurable hepatocellular carcinoma exhausted treatment, cancer related pain: Treated with IV fluids and nausea medications.  She is able to achieve symptom control today. Patient will be using Zofran  and Phenergan  alternatively to control her nausea at home. Pain medication, MS Contin  Gutierrez-acting 15 mg twice daily, short acting morphine  for breakthrough pain. Continue Colace and MiraLAX .   Patient will need more education, information regarding end-of-life care and follow-up at hospice. Patient has adequate support system  at home.  She is able to go home today. CODE STATUS, hospitalization and symptom management to be discussed by hospice team at home.   Possible pneumonia: Patient does not have any evidence of bacterial pneumonia.  Will discontinue antibiotics.   Anemia of chronic disease: Hemoglobin 6.7-given 1 unit of transfusion.  Hemoglobin 8.  No evidence of active bleeding.   Goals of care: Full code, continued discussions about hospice philosophy   Discharge Planning: Home today when DME is delivered   Family Contact: patient speaks for herself    IDT: updated   Medication List and Transfer Summary uploaded to patient chart.   Should patient need ambulance transport at discharge, please call [facility] as we contract with them for our active hospice patients.   Please call with any hospice questions or concerns.   Thank you, Eleanor Nail, [facility] Liaison [phone]

## 2024-06-24 ENCOUNTER — Other Ambulatory Visit (HOSPITAL_COMMUNITY): Payer: Self-pay

## 2024-06-24 MED ORDER — IPRATROPIUM-ALBUTEROL 0.5-2.5 (3) MG/3ML IN SOLN
RESPIRATORY_TRACT | 3 refills | Status: DC
  Filled 2024-06-24: qty 90, 7d supply, fill #0

## 2024-06-25 ENCOUNTER — Inpatient Hospital Stay (HOSPITAL_COMMUNITY)
Admission: EM | Admit: 2024-06-25 | Discharge: 2024-07-07 | DRG: 189 | Disposition: E | Payer: Self-pay | Attending: Internal Medicine | Admitting: Internal Medicine

## 2024-06-25 ENCOUNTER — Inpatient Hospital Stay (HOSPITAL_COMMUNITY): Payer: Self-pay

## 2024-06-25 ENCOUNTER — Emergency Department (HOSPITAL_COMMUNITY): Payer: Self-pay

## 2024-06-25 DIAGNOSIS — J189 Pneumonia, unspecified organism: Secondary | ICD-10-CM | POA: Diagnosis present

## 2024-06-25 DIAGNOSIS — C799 Secondary malignant neoplasm of unspecified site: Secondary | ICD-10-CM | POA: Diagnosis present

## 2024-06-25 DIAGNOSIS — C220 Liver cell carcinoma: Secondary | ICD-10-CM | POA: Diagnosis present

## 2024-06-25 DIAGNOSIS — G893 Neoplasm related pain (acute) (chronic): Secondary | ICD-10-CM | POA: Diagnosis present

## 2024-06-25 DIAGNOSIS — R042 Hemoptysis: Secondary | ICD-10-CM | POA: Diagnosis present

## 2024-06-25 DIAGNOSIS — Z1152 Encounter for screening for COVID-19: Secondary | ICD-10-CM

## 2024-06-25 DIAGNOSIS — Z66 Do not resuscitate: Secondary | ICD-10-CM | POA: Diagnosis present

## 2024-06-25 DIAGNOSIS — J80 Acute respiratory distress syndrome: Principal | ICD-10-CM | POA: Diagnosis present

## 2024-06-25 DIAGNOSIS — R0602 Shortness of breath: Secondary | ICD-10-CM

## 2024-06-25 DIAGNOSIS — C229 Malignant neoplasm of liver, not specified as primary or secondary: Secondary | ICD-10-CM

## 2024-06-25 DIAGNOSIS — R54 Age-related physical debility: Secondary | ICD-10-CM | POA: Diagnosis present

## 2024-06-25 DIAGNOSIS — Z7189 Other specified counseling: Secondary | ICD-10-CM

## 2024-06-25 DIAGNOSIS — Z515 Encounter for palliative care: Secondary | ICD-10-CM

## 2024-06-25 DIAGNOSIS — R630 Anorexia: Secondary | ICD-10-CM | POA: Diagnosis present

## 2024-06-25 DIAGNOSIS — J9601 Acute respiratory failure with hypoxia: Secondary | ICD-10-CM | POA: Diagnosis present

## 2024-06-25 DIAGNOSIS — R64 Cachexia: Secondary | ICD-10-CM | POA: Diagnosis present

## 2024-06-25 DIAGNOSIS — Z603 Acculturation difficulty: Secondary | ICD-10-CM | POA: Diagnosis present

## 2024-06-25 DIAGNOSIS — K219 Gastro-esophageal reflux disease without esophagitis: Secondary | ICD-10-CM | POA: Diagnosis present

## 2024-06-25 DIAGNOSIS — Z9221 Personal history of antineoplastic chemotherapy: Secondary | ICD-10-CM

## 2024-06-25 DIAGNOSIS — R627 Adult failure to thrive: Secondary | ICD-10-CM | POA: Diagnosis present

## 2024-06-25 DIAGNOSIS — Z79899 Other long term (current) drug therapy: Secondary | ICD-10-CM

## 2024-06-25 DIAGNOSIS — Z681 Body mass index (BMI) 19 or less, adult: Secondary | ICD-10-CM

## 2024-06-25 DIAGNOSIS — J9621 Acute and chronic respiratory failure with hypoxia: Principal | ICD-10-CM

## 2024-06-25 DIAGNOSIS — Z79891 Long term (current) use of opiate analgesic: Secondary | ICD-10-CM

## 2024-06-25 LAB — COMPREHENSIVE METABOLIC PANEL WITH GFR
ALT: 109 U/L — ABNORMAL HIGH (ref 0–44)
AST: 261 U/L — ABNORMAL HIGH (ref 15–41)
Albumin: 3 g/dL — ABNORMAL LOW (ref 3.5–5.0)
Alkaline Phosphatase: 120 U/L (ref 38–126)
Anion gap: 9 (ref 5–15)
BUN: 6 mg/dL (ref 6–20)
CO2: 21 mmol/L — ABNORMAL LOW (ref 22–32)
Calcium: 9 mg/dL (ref 8.9–10.3)
Chloride: 99 mmol/L (ref 98–111)
Creatinine, Ser: 0.41 mg/dL — ABNORMAL LOW (ref 0.44–1.00)
GFR, Estimated: >60 mL/min
Glucose, Bld: 105 mg/dL — ABNORMAL HIGH (ref 70–99)
Potassium: 4.7 mmol/L (ref 3.5–5.1)
Sodium: 129 mmol/L — ABNORMAL LOW (ref 135–145)
Total Bilirubin: 3.1 mg/dL — ABNORMAL HIGH (ref 0.0–1.2)
Total Protein: 6.7 g/dL (ref 6.5–8.1)

## 2024-06-25 LAB — I-STAT CG4 LACTIC ACID, ED
Lactic Acid, Venous: 2.2 mmol/L (ref 0.5–1.9)
Lactic Acid, Venous: 1.5 mmol/L (ref 0.5–1.9)

## 2024-06-25 LAB — CBC WITH DIFFERENTIAL/PLATELET
Abs Immature Granulocytes: 0.09 K/uL — ABNORMAL HIGH (ref 0.00–0.07)
Basophils Absolute: 0.1 K/uL (ref 0.0–0.1)
Basophils Relative: 1 %
Eosinophils Absolute: 0.3 K/uL (ref 0.0–0.5)
Eosinophils Relative: 3 %
HCT: 26.3 % — ABNORMAL LOW (ref 36.0–46.0)
Hemoglobin: 8.9 g/dL — ABNORMAL LOW (ref 12.0–15.0)
Immature Granulocytes: 1 %
Lymphocytes Relative: 4 %
Lymphs Abs: 0.4 K/uL — ABNORMAL LOW (ref 0.7–4.0)
MCH: 31.9 pg (ref 26.0–34.0)
MCHC: 33.8 g/dL (ref 30.0–36.0)
MCV: 94.3 fL (ref 80.0–100.0)
Monocytes Absolute: 0.9 K/uL (ref 0.1–1.0)
Monocytes Relative: 9 %
Neutro Abs: 7.8 K/uL — ABNORMAL HIGH (ref 1.7–7.7)
Neutrophils Relative %: 82 %
Platelets: 225 K/uL (ref 150–400)
RBC: 2.79 MIL/uL — ABNORMAL LOW (ref 3.87–5.11)
RDW: 23.9 % — ABNORMAL HIGH (ref 11.5–15.5)
WBC: 9.6 K/uL (ref 4.0–10.5)
nRBC: 0 % (ref 0.0–0.2)

## 2024-06-25 LAB — PROCALCITONIN: Procalcitonin: 0.15 ng/mL

## 2024-06-25 LAB — PRO BRAIN NATRIURETIC PEPTIDE: Pro Brain Natriuretic Peptide: 358 pg/mL — ABNORMAL HIGH

## 2024-06-25 LAB — PROTIME-INR
INR: 1.8 — ABNORMAL HIGH (ref 0.8–1.2)
Prothrombin Time: 21.4 s — ABNORMAL HIGH (ref 11.4–15.2)

## 2024-06-25 MED ORDER — ENOXAPARIN SODIUM 40 MG/0.4ML IJ SOSY: 40.0000 mg | PREFILLED_SYRINGE | INTRAMUSCULAR | Status: DC

## 2024-06-25 MED ORDER — VANCOMYCIN HCL IN DEXTROSE 1-5 GM/200ML-% IV SOLN: 1000.0000 mg | INTRAVENOUS | Status: DC

## 2024-06-25 MED ORDER — SODIUM CHLORIDE 0.9 % IV SOLN
2.0000 g | Freq: Three times a day (TID) | INTRAVENOUS | Status: DC
  Administered 2024-06-25 – 2024-06-26 (×2): 2 g via INTRAVENOUS
  Filled 2024-06-25 (×2): qty 12.5

## 2024-06-25 MED ORDER — IPRATROPIUM-ALBUTEROL 0.5-2.5 (3) MG/3ML IN SOLN
3.0000 mL | Freq: Four times a day (QID) | RESPIRATORY_TRACT | Status: DC
  Administered 2024-06-25 (×2): 3 mL via RESPIRATORY_TRACT
  Filled 2024-06-25 (×2): qty 3

## 2024-06-25 MED ORDER — ALBUTEROL SULFATE (2.5 MG/3ML) 0.083% IN NEBU: 2.5000 mg | INHALATION_SOLUTION | RESPIRATORY_TRACT | Status: DC | PRN

## 2024-06-25 MED ORDER — IOHEXOL 300 MG/ML  SOLN
75.0000 mL | Freq: Once | INTRAMUSCULAR | Status: AC | PRN
  Administered 2024-06-25: 75 mL via INTRAVENOUS

## 2024-06-25 MED ORDER — TRAZODONE HCL 50 MG PO TABS: 25.0000 mg | ORAL_TABLET | Freq: Every evening | ORAL | Status: DC | PRN

## 2024-06-25 MED ORDER — METHYLPREDNISOLONE SODIUM SUCC 40 MG IJ SOLR
40.0000 mg | Freq: Two times a day (BID) | INTRAMUSCULAR | Status: DC
  Administered 2024-06-25 – 2024-06-26 (×2): 40 mg via INTRAVENOUS
  Filled 2024-06-25 (×2): qty 1

## 2024-06-25 MED ORDER — HYDROMORPHONE HCL 1 MG/ML IJ SOLN
0.5000 mg | Freq: Once | INTRAMUSCULAR | Status: AC
  Administered 2024-06-25: 0.5 mg via INTRAVENOUS
  Filled 2024-06-25: qty 1

## 2024-06-25 MED ORDER — MORPHINE SULFATE ER 15 MG PO TBCR: 15.0000 mg | EXTENDED_RELEASE_TABLET | Freq: Two times a day (BID) | ORAL | Status: DC

## 2024-06-25 MED ORDER — ONDANSETRON HCL 4 MG PO TABS: 4.0000 mg | ORAL_TABLET | Freq: Four times a day (QID) | ORAL | Status: DC | PRN

## 2024-06-25 MED ORDER — VANCOMYCIN HCL IN DEXTROSE 1-5 GM/200ML-% IV SOLN
1000.0000 mg | Freq: Once | INTRAVENOUS | Status: AC
  Administered 2024-06-25: 1000 mg via INTRAVENOUS
  Filled 2024-06-25: qty 200

## 2024-06-25 MED ORDER — LORAZEPAM 2 MG/ML IJ SOLN
0.5000 mg | Freq: Four times a day (QID) | INTRAMUSCULAR | Status: DC | PRN
  Administered 2024-06-26: 0.5 mg via INTRAVENOUS
  Filled 2024-06-25 (×2): qty 1

## 2024-06-25 MED ORDER — HYDROMORPHONE HCL 1 MG/ML IJ SOLN
0.5000 mg | INTRAMUSCULAR | Status: DC | PRN
  Administered 2024-06-25 – 2024-06-26 (×4): 1 mg via INTRAVENOUS
  Filled 2024-06-25 (×4): qty 1

## 2024-06-25 MED ORDER — HYDROCOD POLI-CHLORPHE POLI ER 10-8 MG/5ML PO SUER
5.0000 mL | Freq: Two times a day (BID) | ORAL | Status: DC | PRN
  Administered 2024-06-25: 5 mL via ORAL
  Filled 2024-06-25: qty 5

## 2024-06-25 MED ORDER — ONDANSETRON HCL 4 MG/2ML IJ SOLN
4.0000 mg | Freq: Four times a day (QID) | INTRAMUSCULAR | Status: DC | PRN
  Administered 2024-06-25 – 2024-06-26 (×2): 4 mg via INTRAVENOUS
  Filled 2024-06-25 (×2): qty 2

## 2024-06-25 MED ORDER — MORPHINE SULFATE 15 MG PO TABS: 15.0000 mg | ORAL_TABLET | ORAL | Status: DC | PRN

## 2024-06-25 MED ORDER — PANTOPRAZOLE SODIUM 40 MG IV SOLR: 40.0000 mg | INTRAVENOUS | Status: DC

## 2024-06-25 MED ORDER — PANTOPRAZOLE SODIUM 40 MG PO TBEC: 40.0000 mg | DELAYED_RELEASE_TABLET | Freq: Every day | ORAL | Status: DC

## 2024-06-25 NOTE — Progress Notes (Signed)
" °   06/25/24 1510  BiPAP/CPAP/SIPAP  FiO2 (%) (S)  80 % (100% Sp02 on 90% FI02, weaned to 80%.)    "

## 2024-06-25 NOTE — H&P (Addendum)
 "  NAME:  Christina Gutierrez, MRN:  [id number], DOB:  12/22/1975, LOS: 0 ADMISSION DATE:  06/25/2024, CONSULTATION DATE:  06/25/24   History of Present Illness:   HPI: Christina Gutierrez is a 48 y.o. female with medical history significant for hepatitis B, hepatocellular carcinoma no longer on treatment with hospice at home who came with anorexia, coughing up blood, and hypoxic respiratory failure.  She was just discharged from the hospital on 12/17 home with hospice after stay for intractable nausea, feeling dehydrated and not eating. She had multifocal pneumonia at the time, but there was no signs or symptoms of acute infection and antibiotics were discontinued during that hospitalization.  Today patient arrived via ambulance with complaints of cough and shortness of breath, she was discharged on 5 L home oxygen, placed on nonrebreather by EMS.  Patient told EMS that she is coughing up some blood-tinged sputum.  On arrival, she was saturating 80% on nonrebreather mask, she was placed on BiPAP.  She is hemodynamically stable.  Per ER provider, she is breathing much more comfortably now on the BiPAP.  Patient and family deny any fevers or chills, any change in her significant pain.    Pertinent  Medical History   Past Medical History:  Diagnosis Date   Cancer (HCC)    Hepatocellular carcinoma (HCC) 08/22/2023   Metabolic acidosis 08/21/2023     Significant Hospital Events: Including procedures, antibiotic start and stop dates in addition to other pertinent events   Patient on bipap, CT scan with ARDS picture. Code status change to DNR/DNI  Interim History / Subjective:  Patient on bipap, tachypneic,  Husband at bedside  Objective    Blood pressure (!) 107/54, pulse 85, temperature 98.6 F (37 C), temperature source Axillary, resp. rate (!) 24, height 5' (1.524 m), weight 46 kg, SpO2 95%.    FiO2 (%):  [60 %-100 %] 60 % PEEP:  [5 cmH20] 5 cmH20 Pressure Support:  [5  cmH20] 5 cmH20  No intake or output data in the 24 hours ending 06/25/24 1829 Filed Weights   06/25/24 1450  Weight: 46 kg    Examination: Physical Exam Constitutional:      Appearance: She is ill-appearing and diaphoretic.  HENT:     Head: Normocephalic.  Eyes:     Pupils: Pupils are equal, round, and reactive to light.  Cardiovascular:     Rate and Rhythm: Normal rate and regular rhythm.  Pulmonary:     Effort: Tachypnea and respiratory distress present.     Comments: Decreased breath sounds, on bipap Abdominal:     Palpations: There is hepatomegaly and mass.  Musculoskeletal:        General: Normal range of motion.  Skin:    General: Skin is warm.  Neurological:     General: No focal deficit present.     Mental Status: She is oriented to person, place, and time.      Resolved problem list   Assessment and Plan  Christina Gutierrez is a 48 y.o. female with medical history significant for hepatitis B, hepatocellular carcinoma no longer on treatment on home hospice, who came for AHRF 2/2 to ARDS picture.  Acute hypoxic respiratory failure ARDS with multifocal opacities bilaterally  End stage Hepatocellular carcinoma  Patient was admitted for ARDS picture with multifocal pneumonia and aucte hypoxic resp failure. Her procal WBC is normal. Last admission she was treated with antibiotics but discontinued. CT scan showed extensive GGO and consolidation in both lungs and  cardiomegaly.   Her ARDS picture has been present since 5 days ago, she was discharge with home hospice. Based on the extensive GGOs, it is likely to be ARDS picture vs metastatic disease. She does not seem to be septic, however I will recommend coverage with antibiotics and adding solumedrol. She has not received recent chemotherapy. Last dose of atezolizumab  on 02/26/2024.   After extensive conversation with patient and family, she know she has end stage liver cancer, and poor prognosis. I expressed  that even if we do intubation, chest compressions would not change the outcome and she will suffer through all the process. She agreed to be transition to DNR/DNI, she expressed she does not want to be intubated or receive chest compressions. She wants to be comfortable and receive all the medications possible without extreme measures.   -fungitell, aspergillus, Sputum cx -vanc+cefepime  -solumedrol 40 mg IV BID -ProBNP 358 -Change in code status DNR/DNI -Dilaudid  prn for pain -Avoid aggressive fluid resuscitation. -Palliative care in the morning  Best Practice (right click and Reselect all SmartList Selections daily)   Diet/type: NPO DVT prophylaxis prophylactic heparin   Pressure ulcer(s): N/A GI prophylaxis: PPI Lines: N/A Foley:  N/A Code Status:  DNR Last date of multidisciplinary goals of care discussion: today  Labs   CBC: Recent Labs  Lab 06/20/24 1227 06/21/24 0539 06/21/24 1838 06/25/24 1456  WBC 5.7 4.7  --  9.6  NEUTROABS  --   --   --  7.8*  HGB 8.0* 6.7* 8.0* 8.9*  HCT 24.4* 20.6* 24.5* 26.3*  MCV 94.6 95.8  --  94.3  PLT 230 193  --  225    Basic Metabolic Panel: Recent Labs  Lab 06/20/24 1227 06/21/24 0539 06/25/24 1456  NA 133* 137 129*  K 4.3 3.9 4.7  CL 98 108 99  CO2 22 20* 21*  GLUCOSE 101* 81 105*  BUN 6 6 6   CREATININE 0.52 0.45 0.41*  CALCIUM 9.9 8.1* 9.0   GFR: Estimated Creatinine Clearance: 63.1 mL/min (A) (by C-G formula based on SCr of 0.41 mg/dL (L)). Recent Labs  Lab 06/20/24 1227 06/21/24 0539 06/25/24 1451 06/25/24 1456 06/25/24 1624 06/25/24 1720  PROCALCITON  --   --   --   --   --  0.15  WBC 5.7 4.7  --  9.6  --   --   LATICACIDVEN  --   --  2.2*  --  1.5  --     Liver Function Tests: Recent Labs  Lab 06/20/24 1227 06/21/24 0539 06/25/24 1456  AST 254* 212* 261*  ALT 109* 89* 109*  ALKPHOS 134* 100 120  BILITOT 2.4* 1.8* 3.1*  PROT 7.5 5.9* 6.7  ALBUMIN 3.2* 2.7* 3.0*   Recent Labs  Lab  06/20/24 1227  LIPASE 11   Recent Labs  Lab 06/20/24 1335  AMMONIA 74*    ABG    Component Value Date/Time   TCO2 21 (L) 05/12/2024 1353     Coagulation Profile: Recent Labs  Lab 06/20/24 1336 06/25/24 1456  INR 1.5* 1.8*    Cardiac Enzymes: Recent Labs  Lab 06/20/24 1326  CKTOTAL 13*    HbA1C: No results found for: HGBA1C  CBG: No results for input(s): GLUCAP in the last 168 hours.  Review of Systems:   As above  Past Medical History:  She,  has a past medical history of Cancer (HCC), Hepatocellular carcinoma (HCC) (08/22/2023), and Metabolic acidosis (08/21/2023).   Surgical History:   Past Surgical History:  Procedure Laterality Date   CESAREAN SECTION     IR IMAGING GUIDED PORT INSERTION  09/22/2023     Social History:   reports that she has never smoked. She has never used smokeless tobacco. She reports that she does not drink alcohol  and does not use drugs.   Family History:  Her family history is not on file.   Allergies Allergies[1]   Home Medications  Prior to Admission medications  Medication Sig Start Date End Date Taking? Authorizing Provider  acetaminophen  (TYLENOL ) 325 MG tablet Take 2 tablets (650 mg total) by mouth every 6 (six) hours as needed for mild pain (pain score 1-3) or fever (or Fever >/= 101). Patient not taking: Reported on 06/20/2024 05/16/24   Barbarann Nest, MD  ALPRAZolam  (XANAX ) 0.5 MG tablet Take 0.5 mg by mouth daily.    [provider]  ipratropium-albuterol  (DUONEB) 0.5-2.5 (3) MG/3ML SOLN Inhale 1 vial using nebulizer every six hours as needed 06/24/24     lidocaine -prilocaine  (EMLA ) cream Apply topically as needed. Patient not taking: Reported on 06/20/2024 05/13/24   [place], [name] PARAS, DO  morphine  (MS CONTIN ) 15 MG 12 hr tablet Take 1 tablet (15 mg total) by mouth every 12 (twelve) hours. 06/09/24     morphine  (MSIR) 15 MG tablet Take 1 tablet (15 mg total) by mouth every 3 (three) hours as needed  for severe pain (pain score 7-10). 05/13/24   [place], [name] PARAS, DO  omeprazole  (PRILOSEC) 40 MG capsule Take 1 capsule (40 mg total) by mouth daily. 06/22/24   Raenelle Coria, MD  ondansetron  (ZOFRAN ) 8 MG tablet Take 1 tablet (8 mg total) by mouth every 8 (eight) hours as needed for nausea or vomiting. 06/22/24   Raenelle Coria, MD  polyethylene glycol powder (GLYCOLAX /MIRALAX ) 17 GM/SCOOP powder Take 17 g by mouth daily. Dissolve 1 capful (17g) in 4-8 ounces of liquid and take by mouth daily. 06/22/24 07/22/24  Raenelle Coria, MD  prochlorperazine  (COMPAZINE ) 10 MG tablet Take 1 tablet (10 mg total) by mouth every 6 (six) hours as needed for refractory nausea / vomiting. 06/22/24   Raenelle Coria, MD  senna-docusate (SENOKOT-S) 8.6-50 MG tablet Take 1 tablet by mouth 2 (two) times daily. 06/22/24 09/20/24  Raenelle Coria, MD  tenofovir  alafenamide (VEMLIDY ) 25 MG tablet Take 1 tablet (25 mg total) by mouth daily. 05/17/24   Fleeta Kathie Jomarie LOISE, MD       The patient is critically ill due to ARDS.  Critical care was necessary to treat or prevent imminent or life-threatening deterioration. Critical care time was spent by me on the following activities: development of a treatment plan with the patient and/or surrogate as well as nursing, discussions with consultants, evaluation of the patient's response to treatment, examination of the patient, obtaining a history from the patient or surrogate, ordering and performing treatments and interventions, ordering and review of laboratory studies, ordering and review of radiographic studies, review of telemetry data including pulse oximetry, re-evaluation of patient's condition and participation in multidisciplinary rounds.   I personally spent 40 minutes providing critical care not including any separately billable procedures.  Marny Patch, MD [facility] 06/25/2024 6:29 PM                [1] No Known Allergies  "

## 2024-06-25 NOTE — Progress Notes (Signed)
" °   06/25/24 1758  BiPAP/CPAP/SIPAP  BiPAP/CPAP/SIPAP (S)   (Pt transported from CT scan to ICU # 1222, on bipap, 10/5, RR 15, 60%, full E cylinder, Sp02 96%.)    "

## 2024-06-25 NOTE — Progress Notes (Signed)
 Pharmacy Antibiotic Note  Christina Gutierrez is a 48 y.o. female admitted on 06/25/2024 with pneumonia and sepsis.  Pharmacy has been consulted for Vancomycin , and Cefepime  dosing.  Plan: Cefepime  2g IV q8h Vancomycin  1000 mg IV q24h  (SCr rounded 0.8, est AUC 532) Measure Vanc levels as needed.  Goal AUC = 400 - 550 Follow up renal function, culture results, and clinical course.   Height: 5' (152.4 cm) Weight: 46 kg (101 lb 6.6 oz) IBW/kg (Calculated) : 45.5  Temp (24hrs), Avg:98.6 F (37 C), Min:98.6 F (37 C), Max:98.6 F (37 C)  Recent Labs  Lab 06/20/24 1227 06/21/24 0539 06/25/24 1451 06/25/24 1456 06/25/24 1624  WBC 5.7 4.7  --  9.6  --   CREATININE 0.52 0.45  --  0.41*  --   LATICACIDVEN  --   --  2.2*  --  1.5    Estimated Creatinine Clearance: 63.1 mL/min (A) (by C-G formula based on SCr of 0.41 mg/dL (L)).    Allergies[1]  Antimicrobials this admission: 12/20 Cefepime  >> 12/20 Vancomycin  >>   Dose adjustments this admission:   Microbiology results: 12/20 Resp panel: 12/20 MRSA PCR:   Thank you for allowing pharmacy to be a part of this patients care.  Wanda Hasting PharmD, BCPS [facility] main pharmacy [phone] 06/25/2024 5:29 PM     [1] No Known Allergies

## 2024-06-25 NOTE — ED Provider Notes (Signed)
 1616 she is TTS [place] EMERGENCY DEPARTMENT AT [facility] Provider Note   CSN: [id number] Arrival date & time: 06/25/24  1429     Patient presents with: Shortness of Breath   Christina Gutierrez is a 48 y.o. female.    Shortness of Breath Patient presented severe shortness of breath and failure to thrive.  Has a history of end-stage liver cancer.  Is on hospice but it appears a goals of care have not necessarily been delineated yet.  Has had more shortness of breath.  On chronic 5 L oxygen.  Has been coughing or vomiting mucus that was light red.  Decreased appetite.  Patient speaks Spanish.  Education administrator used.  Discussed with patient and her husband and it sounds for now as if they wish to be full code including intubation.     Prior to Admission medications  Medication Sig Start Date End Date Taking? Authorizing Provider  acetaminophen  (TYLENOL ) 325 MG tablet Take 2 tablets (650 mg total) by mouth every 6 (six) hours as needed for mild pain (pain score 1-3) or fever (or Fever >/= 101). Patient not taking: Reported on 06/20/2024 05/16/24   Barbarann Nest, MD  ALPRAZolam  (XANAX ) 0.5 MG tablet Take 0.5 mg by mouth daily.    [provider]  ipratropium-albuterol  (DUONEB) 0.5-2.5 (3) MG/3ML SOLN Inhale 1 vial using nebulizer every six hours as needed 06/24/24     lidocaine -prilocaine  (EMLA ) cream Apply topically as needed. Patient not taking: Reported on 06/20/2024 05/13/24   [place], [name] PARAS, DO  morphine  (MS CONTIN ) 15 MG 12 hr tablet Take 1 tablet (15 mg total) by mouth every 12 (twelve) hours. 06/09/24     morphine  (MSIR) 15 MG tablet Take 1 tablet (15 mg total) by mouth every 3 (three) hours as needed for severe pain (pain score 7-10). 05/13/24   [place], [name] PARAS, DO  omeprazole  (PRILOSEC) 40 MG capsule Take 1 capsule (40 mg total) by mouth daily. 06/22/24   Raenelle Coria, MD  ondansetron  (ZOFRAN ) 8 MG tablet Take 1 tablet (8 mg total) by mouth  every 8 (eight) hours as needed for nausea or vomiting. 06/22/24   Raenelle Coria, MD  polyethylene glycol powder (GLYCOLAX /MIRALAX ) 17 GM/SCOOP powder Take 17 g by mouth daily. Dissolve 1 capful (17g) in 4-8 ounces of liquid and take by mouth daily. 06/22/24 07/22/24  Raenelle Coria, MD  prochlorperazine  (COMPAZINE ) 10 MG tablet Take 1 tablet (10 mg total) by mouth every 6 (six) hours as needed for refractory nausea / vomiting. 06/22/24   Raenelle Coria, MD  senna-docusate (SENOKOT-S) 8.6-50 MG tablet Take 1 tablet by mouth 2 (two) times daily. 06/22/24 09/20/24  Raenelle Coria, MD  tenofovir  alafenamide (VEMLIDY ) 25 MG tablet Take 1 tablet (25 mg total) by mouth daily. 05/17/24   Fleeta Kathie Jomarie LOISE, MD    Allergies: Patient has no known allergies.    Review of Systems  Respiratory:  Positive for shortness of breath.     Updated Vital Signs BP 109/61   Pulse 80   Temp 98.6 F (37 C) (Axillary)   Resp 20   Ht 5' (1.524 m)   Wt 46 kg   SpO2 100%   BMI 19.81 kg/m   Physical Exam Vitals reviewed.  Cardiovascular:     Rate and Rhythm: Regular rhythm.  Pulmonary:     Comments: Diffuse harsh breath sounds with tachypnea. Musculoskeletal:     Right lower leg: No edema.     Left lower leg:  No edema.  Neurological:     Comments: Speaks quietly.SABRA  Appears appropriate however.     (all labs ordered are listed, but only abnormal results are displayed) Labs Reviewed  COMPREHENSIVE METABOLIC PANEL WITH GFR - Abnormal; Notable for the following components:      Result Value   Sodium 129 (*)    CO2 21 (*)    Glucose, Bld 105 (*)    Creatinine, Ser 0.41 (*)    Albumin 3.0 (*)    AST 261 (*)    ALT 109 (*)    Total Bilirubin 3.1 (*)    All other components within normal limits  PROTIME-INR - Abnormal; Notable for the following components:   Prothrombin Time 21.4 (*)    INR 1.8 (*)    All other components within normal limits  CBC WITH DIFFERENTIAL/PLATELET - Abnormal; Notable  for the following components:   RBC 2.79 (*)    Hemoglobin 8.9 (*)    HCT 26.3 (*)    RDW 23.9 (*)    Neutro Abs 7.8 (*)    Lymphs Abs 0.4 (*)    Abs Immature Granulocytes 0.09 (*)    All other components within normal limits  I-STAT CG4 LACTIC ACID, ED - Abnormal; Notable for the following components:   Lactic Acid, Venous 2.2 (*)    All other components within normal limits    EKG: EKG Interpretation Date/Time:  Saturday June 25 2024 15:23:14 EST Ventricular Rate:  84 PR Interval:  169 QRS Duration:  91 QT Interval:  319 QTC Calculation: 377 R Axis:   133  Text Interpretation: Sinus rhythm Right axis deviation Low voltage, precordial leads Confirmed by Patsey Lot [phone]) on 06/25/2024 3:25:07 PM  Radiology: DG Chest Portable 1 View Result Date: 06/25/2024 CLINICAL DATA:  Shortness of breath and cough. EXAM: PORTABLE CHEST 1 VIEW COMPARISON:  06/20/2024 FINDINGS: Right chest port in place. Development of diffuse heterogeneous bilateral lung opacities. Small bilateral pleural effusions. Stable heart size and mediastinal contours. No pneumothorax. On limited assessment, no acute osseous findings. IMPRESSION: Development of diffuse heterogeneous bilateral lung opacities over the last 5 days. Differential considerations include multifocal pneumonia, pulmonary edema, or ARDS. Small bilateral pleural effusions. Electronically Signed   By: Andrea Gasman M.D.   On: 06/25/2024 15:25     Procedures   Medications Ordered in the ED - No data to display                                  Medical Decision Making Amount and/or Complexity of Data Reviewed Labs: ordered. Radiology: ordered.  Risk Decision regarding hospitalization.   Patient with worsening respiratory status.  Has had cough.  Increasing oxygen requirement.  Already has end-stage cancer.  Appears to be still full code.  X-ray is somewhat horrible.  Worsening.  Pneumonia versus edema versus  ARDS  Patient's white count still stable.  White count of 9.6.  INR elevated 1.8.  Appears higher than baseline.  CMP shows a sodium of 129.  AST and ALT are similar to previous elevations.  Bilirubin is 3.1 which is elevated from 4 days ago.  Initial lactic acid 2.2.  Patient's mental status improved somewhat on BiPAP.  Now up and looking around.  I have discussed with Nat Babe from  [facility] hospice.  Will attempt to potentially see patient today.   Chest x-ray does show infiltrate.  Will discuss with hospitalist for  admission.  CRITICAL CARE Performed by: [place] Total critical care time: 30 minutes Critical care time was exclusive of separately billable procedures and treating other patients. Critical care was necessary to treat or prevent imminent or life-threatening deterioration. Critical care was time spent personally by me on the following activities: development of treatment plan with patient and/or surrogate as well as nursing, discussions with consultants, evaluation of patient's response to treatment, examination of patient, obtaining history from patient or surrogate, ordering and performing treatments and interventions, ordering and review of laboratory studies, ordering and review of radiographic studies, pulse oximetry and re-evaluation of patient's condition.        Final diagnoses:  Acute on chronic respiratory failure with hypoxia [facility])  Metastatic malignant neoplasm, unspecified site [facility])    ED Discharge Orders     None          River Rankin, MD 06/25/24 2308

## 2024-06-25 NOTE — ED Triage Notes (Signed)
 Pt is spoanish speaking only. Pt BIB EMS c/o [facility] and cough for past week. Seen here recently and chest X ray was negative. 5L is home O2 liters but has to use NRB by EMS. Currently jaundiced (liver cancer hx) cites NV w no appetitte. Threw up light red mucous. Also producing phlegm   EMS vitals  BP 114/60 HR 90 SPO2 currentyl 80 % on NRB  CBG

## 2024-06-25 NOTE — Progress Notes (Signed)
 [facility] ED- [facility] Liaison Note     This patient is a current hospice patient with [facility]. Please see media tab for detailed hospice report.    Liaison will continue to follow for any discharge planning needs and to coordinate continuation of hospice care.    Please don't hesitate to call with any Hospice related questions or concerns.    Thank you for the opportunity to participate in this patient's care.   Nat Babe, BSN, RN [employer] [phone]

## 2024-06-25 NOTE — Progress Notes (Signed)
" °   06/25/24 2304  BiPAP/CPAP/SIPAP  BiPAP/CPAP/SIPAP Pt Type Adult  BiPAP/CPAP/SIPAP SERVO  Mask Type Full face mask  Dentures removed? Not applicable  Mask Size Medium  Set Rate 15 breaths/min  Respiratory Rate 30 breaths/min  IPAP 10 cmH20  EPAP 5 cmH2O  FiO2 (%) 100 %  Minute Ventilation 13.1  Leak 64  Peak Inspiratory Pressure (PIP) 10  Tidal Volume (Vt) 497  Patient Home Machine No  Patient Home Mask No  Patient Home Tubing No  Auto Titrate No  Press High Alarm 25 cmH2O  Press Low Alarm 2 cmH2O  Device Plugged into RED Power Outlet Yes  Oxygen Percent 100 %  BiPAP/CPAP /SiPAP Vitals  Resp (!) 30  Bilateral Breath Sounds Coarse crackles    "

## 2024-06-25 NOTE — Progress Notes (Signed)
" °   06/25/24 1454  BiPAP/CPAP/SIPAP  $ Non-Invasive Ventilator  Non-Invasive Vent Initial  $ Face Mask Medium Yes  BiPAP/CPAP/SIPAP Pt Type Adult  BiPAP/CPAP/SIPAP (S)  SERVO (PCV)  Mask Type Full face mask  Dentures removed? Not applicable  Mask Size Medium  Set Rate (S)  15 breaths/min  Respiratory Rate 32 breaths/min  IPAP (S)  10 cmH20 (5 above 5 peep=10)  EPAP (S)  5 cmH2O  Pressure Support 5 cmH20  PEEP 5 cmH20  FiO2 (%) (S)  90 %  Flow Rate 0.9 lpm  Minute Ventilation 15.6  Leak 22  Peak Inspiratory Pressure (PIP) 14  Tidal Volume (Vt) 514  Patient Home Machine No  Patient Home Mask No  Patient Home Tubing No  Auto Titrate No  Press High Alarm 25 cmH2O  Nasal massage performed No (comment)  CPAP/SIPAP surface wiped down Yes  Device Plugged into RED Power Outlet Yes  Oxygen Percent 90 %  BiPAP/CPAP /SiPAP Vitals  Pulse Rate 87  Resp (!) 32  SpO2 (!) 89 %  Bilateral Breath Sounds Coarse crackles;Diminished  MEWS Score/Color  MEWS Score 2  MEWS Score Color Yellow    "

## 2024-06-25 NOTE — H&P (Addendum)
 " History and Physical  Christina Gutierrez FMW:[id number] DOB: Sep 12, 1975 DOA: 06/25/2024  PCP: [name], MD   Chief Complaint: Hypoxic respiratory failure  HPI: Christina Gutierrez is a 48 y.o. female with medical history significant for hepatitis B, hepatocellular carcinoma no longer on treatment being admitted to the hospital with anorexia, coughing up blood, and hypoxic respiratory failure.  She was just discharged from the hospital on 12/17 home with hospice after stay for intractable nausea, feeling dehydrated and not eating.  There was initially some question of possible pneumonia, however there was no signs or symptoms of acute infection and antibiotics were discontinued during that hospitalization.  Today patient arrived via ambulance with complaints of cough and shortness of breath, she was discharged on 5 L home oxygen, placed on nonrebreather by EMS.  Patient told EMS that she is coughing up some blood-tinged sputum.  On arrival, she was saturating 80% on nonrebreather mask, she was placed on BiPAP.  She is hemodynamically stable.  Per ER provider, she is breathing much more comfortably now on the BiPAP.  Patient and family deny any fevers or chills, any change in her significant pain.  Review of Systems: Please see HPI for pertinent positives and negatives. A complete 10 system review of systems are otherwise negative.  Past Medical History:  Diagnosis Date   Cancer [facility])    Hepatocellular carcinoma (HCC) 08/22/2023   Metabolic acidosis 08/21/2023   Past Surgical History:  Procedure Laterality Date   CESAREAN SECTION     IR IMAGING GUIDED PORT INSERTION  09/22/2023   Social History:  reports that she has never smoked. She has never used smokeless tobacco. She reports that she does not drink alcohol  and does not use drugs.  Allergies[1]  No family history on file.   Prior to Admission medications  Medication Sig Start Date End Date Taking? Authorizing  Provider  acetaminophen  (TYLENOL ) 325 MG tablet Take 2 tablets (650 mg total) by mouth every 6 (six) hours as needed for mild pain (pain score 1-3) or fever (or Fever >/= 101). Patient not taking: Reported on 06/20/2024 05/16/24   Barbarann Nest, MD  ALPRAZolam  (XANAX ) 0.5 MG tablet Take 0.5 mg by mouth daily.    [provider]  ipratropium-albuterol  (DUONEB) 0.5-2.5 (3) MG/3ML SOLN Inhale 1 vial using nebulizer every six hours as needed 06/24/24     lidocaine -prilocaine  (EMLA ) cream Apply topically as needed. Patient not taking: Reported on 06/20/2024 05/13/24   [place], [name] PARAS, DO  morphine  (MS CONTIN ) 15 MG 12 hr tablet Take 1 tablet (15 mg total) by mouth every 12 (twelve) hours. 06/09/24     morphine  (MSIR) 15 MG tablet Take 1 tablet (15 mg total) by mouth every 3 (three) hours as needed for severe pain (pain score 7-10). 05/13/24   [place], Eric J, DO  omeprazole  (PRILOSEC) 40 MG capsule Take 1 capsule (40 mg total) by mouth daily. 06/22/24   Raenelle Coria, MD  ondansetron  (ZOFRAN ) 8 MG tablet Take 1 tablet (8 mg total) by mouth every 8 (eight) hours as needed for nausea or vomiting. 06/22/24   Raenelle Coria, MD  polyethylene glycol powder (GLYCOLAX /MIRALAX ) 17 GM/SCOOP powder Take 17 g by mouth daily. Dissolve 1 capful (17g) in 4-8 ounces of liquid and take by mouth daily. 06/22/24 07/22/24  Raenelle Coria, MD  prochlorperazine  (COMPAZINE ) 10 MG tablet Take 1 tablet (10 mg total) by mouth every 6 (six) hours as needed for refractory nausea / vomiting. 06/22/24   Raenelle Coria,  MD  senna-docusate (SENOKOT-S) 8.6-50 MG tablet Take 1 tablet by mouth 2 (two) times daily. 06/22/24 09/20/24  Ghimire, Kuber, MD  tenofovir  alafenamide (VEMLIDY ) 25 MG tablet Take 1 tablet (25 mg total) by mouth daily. 05/17/24   Fleeta Kathie Jomarie LOISE, MD    Physical Exam: BP 107/62   Pulse 80   Temp 98.6 F (37 C) (Axillary)   Resp (!) 26   Ht 5' (1.524 m)   Wt 46 kg   SpO2 96%   BMI 19.81 kg/m   General:  Alert, oriented, anxious appearing, in no acute distress.  Patient is on BiPAP, her husband is at the bedside.  Seen with assistance of electronic Spanish interpreter Christina Gutierrez [phone]. Eyes: EOMI, clear conjuctivae, icteric sclerea Cardiovascular: RRR, no murmurs or rubs, no peripheral edema  Respiratory: Breath sounds are distant, patient is tachypneic, no wheezing, some diffuse crackles Abdomen: soft, tender, nondistended, normal bowel tones heard  Skin: dry, no rashes  Musculoskeletal: no joint effusions, normal range of motion           Labs on Admission:  Basic Metabolic Panel: Recent Labs  Lab 06/20/24 1227 06/21/24 0539 06/25/24 1456  NA 133* 137 129*  K 4.3 3.9 4.7  CL 98 108 99  CO2 22 20* 21*  GLUCOSE 101* 81 105*  BUN 6 6 6   CREATININE 0.52 0.45 0.41*  CALCIUM 9.9 8.1* 9.0   Liver Function Tests: Recent Labs  Lab 06/20/24 1227 06/21/24 0539 06/25/24 1456  AST 254* 212* 261*  ALT 109* 89* 109*  ALKPHOS 134* 100 120  BILITOT 2.4* 1.8* 3.1*  PROT 7.5 5.9* 6.7  ALBUMIN 3.2* 2.7* 3.0*   Recent Labs  Lab 06/20/24 1227  LIPASE 11   Recent Labs  Lab 06/20/24 1335  AMMONIA 74*   CBC: Recent Labs  Lab 06/20/24 1227 06/21/24 0539 06/21/24 1838 06/25/24 1456  WBC 5.7 4.7  --  9.6  NEUTROABS  --   --   --  7.8*  HGB 8.0* 6.7* 8.0* 8.9*  HCT 24.4* 20.6* 24.5* 26.3*  MCV 94.6 95.8  --  94.3  PLT 230 193  --  225   Cardiac Enzymes: Recent Labs  Lab 06/20/24 1326  CKTOTAL 13*   BNP (last 3 results) No results for input(s): BNP in the last 8760 hours.  ProBNP (last 3 results) No results for input(s): PROBNP in the last 8760 hours.  CBG: No results for input(s): GLUCAP in the last 168 hours.  Radiological Exams on Admission: DG Chest Portable 1 View Result Date: 06/25/2024 CLINICAL DATA:  Shortness of breath and cough. EXAM: PORTABLE CHEST 1 VIEW COMPARISON:  06/20/2024 FINDINGS: Right chest port in place. Development of diffuse  heterogeneous bilateral lung opacities. Small bilateral pleural effusions. Stable heart size and mediastinal contours. No pneumothorax. On limited assessment, no acute osseous findings. IMPRESSION: Development of diffuse heterogeneous bilateral lung opacities over the last 5 days. Differential considerations include multifocal pneumonia, pulmonary edema, or ARDS. Small bilateral pleural effusions. Electronically Signed   By: Andrea Gasman M.D.   On: 06/25/2024 15:25   Assessment/Plan Christina Gutierrez is a 48 y.o. female with medical history significant for hepatitis B, hepatocellular carcinoma no longer on treatment being admitted to the hospital with anorexia, coughing up blood, and hypoxic respiratory failure.   Acute on chronic hypoxic respiratory failure-unclear etiology, as she seems to have developed heterogenous bilateral lung opacities since her last chest x-ray on 12/15.  She is coughing up some blood-tinged  sputum, but otherwise has no leukocytosis, fevers or other infectious symptoms.  She had previously been receiving outpatient IV fluids at the infusion center, but none since hospital discharge per the family. -Inpatient admission to stepdown -Continue BiPAP support, wean as tolerated  -Keep n.p.o. for now until respiratory status improves -Scheduled DuoNebs, as needed albuterol  nebulizer -Empiric IV cefepime  and IV vancomycin  -Check stat CT of the chest, add on BNP and procalcitonin as well as respiratory panel  Goals of care-lengthy discussion via assistance with Spanish interpreter with the patient and her family.  Patient was also able to participate.  Discussed extent of her disease, and her extremely guarded prognosis.  Explained that she will unfortunately dying of her cancer despite any interventions, and that CPR or intubation are likely to cause more harm than good.  Patient and her husband would like us  to try everything at least initially. -Patient will remain  full code for the time being -Discussed with Dr. Clayton, palliative care consult requested  GERD-oral PPI  Advanced stage hepatocellular carcinoma-patient was previously treated by Dr. Conchetta, she has declined further chemotherapy and is on home hospice -Continue home MS Contin  and morphine  as well as IV Dilaudid  for severe breakthrough pain  Abnormal LFTs-she has continued elevated LFTs, and worsening bilirubin consistent with her known multiple hepatic masses  DVT prophylaxis: SCDs only due to hemoptysis.    Code Status: Full Code  Consults called: Palliative care, PCCM  Admission status: The appropriate patient status for this patient is INPATIENT. Inpatient status is judged to be reasonable and necessary in order to provide the required intensity of service to ensure the patient's safety. The patient's presenting symptoms, physical exam findings, and initial radiographic and laboratory data in the context of their chronic comorbidities is felt to place them at high risk for further clinical deterioration. Furthermore, it is not anticipated that the patient will be medically stable for discharge from the hospital within 2 midnights of admission.    I certify that at the point of admission it is my clinical judgment that the patient will require inpatient hospital care spanning beyond 2 midnights from the point of admission due to high intensity of service, high risk for further deterioration and high frequency of surveillance required  Due to a high probability of clinically significant, life threatening deterioration, the patient required my highest level of preparedness to intervene emergently and I personally spent this critical care time directly and personally managing the patient. This critical care time included obtaining a history; examining the patient; reviewing vitals; ordering and review of studies; arranging urgent treatment with development of a management plan; evaluation of  patient's response to treatment; frequent reassessment; and, discussions with other providers as well as available family.  Total critical care time: Approximately 70 minutes  Forney Kleinpeter CHRISTELLA Gail MD [facility] Pager [phone]  If 7PM-7AM, please contact night-coverage [email or web address] Password [facility]  06/25/2024, 5:33 PM      [1] No Known Allergies  "

## 2024-06-25 NOTE — Progress Notes (Signed)
" °   06/25/24 1750  BiPAP/CPAP/SIPAP  BiPAP/CPAP/SIPAP (S)   (Pt transported from [facility] to CT scan, full E cylinder, 60% FI02. Sp02 96%.)    "

## 2024-06-25 NOTE — Progress Notes (Signed)
" °   06/25/24 1615  Oxygen Therapy/Pulse Ox  FiO2 (%) (S)  60 % (Decreased to 60% FI02.)    "

## 2024-06-26 DIAGNOSIS — J9621 Acute and chronic respiratory failure with hypoxia: Principal | ICD-10-CM

## 2024-06-26 DIAGNOSIS — J80 Acute respiratory distress syndrome: Secondary | ICD-10-CM

## 2024-06-26 DIAGNOSIS — G893 Neoplasm related pain (acute) (chronic): Secondary | ICD-10-CM

## 2024-06-26 DIAGNOSIS — C220 Liver cell carcinoma: Secondary | ICD-10-CM

## 2024-06-26 DIAGNOSIS — Z515 Encounter for palliative care: Secondary | ICD-10-CM

## 2024-06-26 DIAGNOSIS — Z66 Do not resuscitate: Secondary | ICD-10-CM

## 2024-06-26 DIAGNOSIS — R0602 Shortness of breath: Secondary | ICD-10-CM

## 2024-06-26 DIAGNOSIS — Z7189 Other specified counseling: Secondary | ICD-10-CM

## 2024-06-26 DIAGNOSIS — Z79899 Other long term (current) drug therapy: Secondary | ICD-10-CM

## 2024-06-26 LAB — CBC
HCT: 26.5 % — ABNORMAL LOW (ref 36.0–46.0)
Hemoglobin: 8.7 g/dL — ABNORMAL LOW (ref 12.0–15.0)
MCH: 31.3 pg (ref 26.0–34.0)
MCHC: 32.8 g/dL (ref 30.0–36.0)
MCV: 95.3 fL (ref 80.0–100.0)
Platelets: 214 K/uL (ref 150–400)
RBC: 2.78 MIL/uL — ABNORMAL LOW (ref 3.87–5.11)
RDW: 23.3 % — ABNORMAL HIGH (ref 11.5–15.5)
WBC: 11.9 K/uL — ABNORMAL HIGH (ref 4.0–10.5)
nRBC: 0 % (ref 0.0–0.2)

## 2024-06-26 LAB — BASIC METABOLIC PANEL WITH GFR
Anion gap: 9 (ref 5–15)
BUN: 8 mg/dL (ref 6–20)
CO2: 21 mmol/L — ABNORMAL LOW (ref 22–32)
Calcium: 9.4 mg/dL (ref 8.9–10.3)
Chloride: 101 mmol/L (ref 98–111)
Creatinine, Ser: 0.38 mg/dL — ABNORMAL LOW (ref 0.44–1.00)
GFR, Estimated: >60 mL/min
Glucose, Bld: 116 mg/dL — ABNORMAL HIGH (ref 70–99)
Potassium: 5.2 mmol/L — ABNORMAL HIGH (ref 3.5–5.1)
Sodium: 131 mmol/L — ABNORMAL LOW (ref 135–145)

## 2024-06-26 MED ORDER — HYDROMORPHONE HCL 1 MG/ML IJ SOLN
INTRAMUSCULAR | Status: AC
  Filled 2024-06-26: qty 2

## 2024-06-26 MED ORDER — HALOPERIDOL LACTATE 5 MG/ML IJ SOLN: 1.0000 mg | INTRAMUSCULAR | Status: DC | PRN

## 2024-06-26 MED ORDER — GLYCOPYRROLATE 0.2 MG/ML IJ SOLN
0.2000 mg | INTRAMUSCULAR | Status: DC | PRN
  Administered 2024-06-26: 0.2 mg via INTRAVENOUS
  Filled 2024-06-26: qty 1

## 2024-06-26 MED ORDER — PROCHLORPERAZINE EDISYLATE 10 MG/2ML IJ SOLN
10.0000 mg | Freq: Four times a day (QID) | INTRAMUSCULAR | Status: DC | PRN
  Administered 2024-06-26: 10 mg via INTRAVENOUS
  Filled 2024-06-26: qty 2

## 2024-06-26 MED ORDER — LORAZEPAM 2 MG/ML IJ SOLN: 0.5000 mg | INTRAMUSCULAR | Status: DC | PRN

## 2024-06-26 MED ORDER — ORAL CARE MOUTH RINSE
15.0000 mL | OROMUCOSAL | Status: DC | PRN
  Administered 2024-06-26: 15 mL via OROMUCOSAL

## 2024-06-26 MED ORDER — POLYVINYL ALCOHOL 1.4 % OP SOLN: 1.0000 [drp] | Freq: Four times a day (QID) | OPHTHALMIC | Status: DC | PRN

## 2024-06-26 MED ORDER — CARMEX CLASSIC LIP BALM EX OINT
TOPICAL_OINTMENT | CUTANEOUS | Status: DC | PRN
  Administered 2024-06-26: 1 via TOPICAL
  Filled 2024-06-26: qty 10

## 2024-06-26 MED ORDER — HYDROMORPHONE HCL-NACL 50-0.9 MG/50ML-% IV SOLN
1.0000 mg/h | INTRAVENOUS | Status: DC
  Administered 2024-06-26: 1 mg/h via INTRAVENOUS
  Administered 2024-06-26: 4 mg/h via INTRAVENOUS
  Administered 2024-06-26: 7 mg/h via INTRAVENOUS
  Filled 2024-06-26 (×3): qty 50

## 2024-06-26 MED ORDER — LORAZEPAM 2 MG/ML IJ SOLN
1.0000 mg | INTRAMUSCULAR | Status: DC | PRN
  Administered 2024-06-26: 1 mg via INTRAVENOUS
  Filled 2024-06-26: qty 1

## 2024-06-26 MED ORDER — HYDROMORPHONE BOLUS VIA INFUSION
1.0000 mg | INTRAVENOUS | Status: DC | PRN
  Administered 2024-06-26: 5 mg via INTRAVENOUS
  Administered 2024-06-26: 2 mg via INTRAVENOUS
  Administered 2024-06-26 (×2): 3 mg via INTRAVENOUS
  Administered 2024-06-26 (×2): 2 mg via INTRAVENOUS
  Administered 2024-06-26 (×2): 5 mg via INTRAVENOUS
  Administered 2024-06-26 (×2): 2 mg via INTRAVENOUS
  Administered 2024-06-26: 5 mg via INTRAVENOUS
  Administered 2024-06-26 (×2): 3 mg via INTRAVENOUS

## 2024-06-26 MED ORDER — BIOTENE DRY MOUTH MT LIQD: 15.0000 mL | OROMUCOSAL | Status: DC | PRN

## 2024-06-26 MED ORDER — LORAZEPAM 2 MG/ML IJ SOLN
1.0000 mg | INTRAMUSCULAR | Status: AC | PRN
  Administered 2024-06-26: 2 mg via INTRAVENOUS
  Filled 2024-06-26: qty 1

## 2024-06-26 MED ORDER — LORAZEPAM 2 MG/ML IJ SOLN: 1.0000 mg | INTRAMUSCULAR | Status: DC | PRN

## 2024-06-27 ENCOUNTER — Other Ambulatory Visit: Payer: Self-pay

## 2024-06-27 ENCOUNTER — Ambulatory Visit: Payer: Self-pay | Admitting: Infectious Disease

## 2024-06-27 DIAGNOSIS — C220 Liver cell carcinoma: Secondary | ICD-10-CM

## 2024-06-27 DIAGNOSIS — B181 Chronic viral hepatitis B without delta-agent: Secondary | ICD-10-CM

## 2024-06-27 NOTE — Progress Notes (Signed)
 Patient deceased. Disenrolling.

## 2024-06-28 LAB — FUNGITELL BETA-D-GLUCAN: Fungitell Value:: 44.567 pg/mL

## 2024-06-29 LAB — ASPERGILLUS ANTIBODY BY IMMUNODIFF
Aspergillus flavus: NEGATIVE
Aspergillus fumigatus, IgG: NEGATIVE
Aspergillus niger: NEGATIVE

## 2024-07-07 NOTE — Progress Notes (Signed)
"  NAME:  Christina Gutierrez, MRN:  [id number], DOB:  March 02, 1976, LOS: 1 ADMISSION DATE:  06/25/2024  History of Present Illness:  Christina Gutierrez is a 49 y.o. female with medical history significant for hepatitis B, hepatocellular carcinoma no longer on treatment with hospice at home who came with anorexia, coughing up blood, and hypoxic respiratory failure.  Christina Gutierrez was just discharged from the hospital on 12/17 home with hospice after stay for intractable nausea, feeling dehydrated and not eating. Christina Gutierrez had multifocal pneumonia at the time, but there was no signs or symptoms of acute infection and antibiotics were discontinued during that hospitalization.  Today patient arrived via ambulance with complaints of cough and shortness of breath, Christina Gutierrez was discharged on 5 L home oxygen, placed on nonrebreather by EMS.  Patient told EMS that Christina Gutierrez is coughing up some blood-tinged sputum.  On arrival, Christina Gutierrez was saturating 80% on nonrebreather mask, Christina Gutierrez was placed on BiPAP.  Christina Gutierrez is hemodynamically stable.  Per ER provider, Christina Gutierrez is breathing much more comfortably now on the BiPAP.  Patient and family deny any fevers or chills, any change in her significant pain.   Pertinent  Medical History  Patient on bipap, CT scan with ARDS picture. Code status change to DNR/DNI   Significant Hospital Events: Including procedures, antibiotic start and stop dates in addition to other pertinent events   Patient very tachypneic. On NRB and bipap. Palliative care transition to comfort.   Interim History / Subjective:  Feels uncomfortable and tachypneic. No tolerating bipap.  Objective    Blood pressure (!) 108/55, pulse 73, temperature 97.6 F (36.4 C), temperature source Axillary, resp. rate 11, height 5' (1.524 m), weight 46 kg, SpO2 94%.    FiO2 (%):  [60 %-100 %] 100 % PEEP:  [5 cmH20] 5 cmH20 Pressure Support:  [5 cmH20] 5 cmH20   Intake/Output Summary (Last 24 hours) at 06/19/2024 0744 Last data filed at  06/22/2024 0400 Gross per 24 hour  Intake 400 ml  Output --  Net 400 ml   Filed Weights   06/25/24 1450  Weight: 46 kg    Examination: Physical Exam Constitutional:      General: Christina Gutierrez is in acute distress.     Appearance: Christina Gutierrez is toxic-appearing.  HENT:     Head: Normocephalic.  Eyes:     Pupils: Pupils are equal, round, and reactive to light.  Cardiovascular:     Rate and Rhythm: Tachycardia present.  Pulmonary:     Effort: Tachypnea, accessory muscle usage and respiratory distress present.  Neurological:     Comments: Able to answer questions in the morning.       Resolved problem list   Assessment and Plan   Christina Gutierrez is a 49 y.o. female with medical history significant for hepatitis B, hepatocellular carcinoma no longer on treatment on home hospice, who came for AHRF 2/2 to ARDS picture.   Acute hypoxic respiratory failure ARDS with multifocal opacities bilaterally  End stage Hepatocellular carcinoma   Patient was admitted for ARDS picture with multifocal pneumonia and aucte hypoxic resp failure. CT scan showed extensive GGO and consolidation in both lungs and cardiomegaly.    Her ARDS picture has been present since 5 days ago, Christina Gutierrez was discharge with home hospice. Based on the extensive GGOs, it is likely to be ARDS picture vs metastatic disease.   Patient is DNR/DNI and comfort care. Multiple family members at bedside.  On dilaudid  drip.     Labs  CBC: Recent Labs  Lab 06/20/24 1227 06/21/24 0539 06/21/24 1838 06/25/24 1456 06/20/2024 0250  WBC 5.7 4.7  --  9.6 11.9*  NEUTROABS  --   --   --  7.8*  --   HGB 8.0* 6.7* 8.0* 8.9* 8.7*  HCT 24.4* 20.6* 24.5* 26.3* 26.5*  MCV 94.6 95.8  --  94.3 95.3  PLT 230 193  --  225 214    Basic Metabolic Panel: Recent Labs  Lab 06/20/24 1227 06/21/24 0539 06/25/24 1456 07/04/2024 0250  NA 133* 137 129* 131*  K 4.3 3.9 4.7 5.2*  CL 98 108 99 101  CO2 22 20* 21* 21*  GLUCOSE 101* 81 105* 116*   BUN 6 6 6 8   CREATININE 0.52 0.45 0.41* 0.38*  CALCIUM 9.9 8.1* 9.0 9.4   GFR: Estimated Creatinine Clearance: 63.1 mL/min (A) (by C-G formula based on SCr of 0.38 mg/dL (L)). Recent Labs  Lab 06/20/24 1227 06/21/24 0539 06/25/24 1451 06/25/24 1456 06/25/24 1624 06/25/24 1720 06/24/2024 0250  PROCALCITON  --   --   --   --   --  0.15  --   WBC 5.7 4.7  --  9.6  --   --  11.9*  LATICACIDVEN  --   --  2.2*  --  1.5  --   --     Liver Function Tests: Recent Labs  Lab 06/20/24 1227 06/21/24 0539 06/25/24 1456  AST 254* 212* 261*  ALT 109* 89* 109*  ALKPHOS 134* 100 120  BILITOT 2.4* 1.8* 3.1*  PROT 7.5 5.9* 6.7  ALBUMIN 3.2* 2.7* 3.0*   Recent Labs  Lab 06/20/24 1227  LIPASE 11   Recent Labs  Lab 06/20/24 1335  AMMONIA 74*    ABG    Component Value Date/Time   TCO2 21 (L) 05/12/2024 1353     Coagulation Profile: Recent Labs  Lab 06/20/24 1336 06/25/24 1456  INR 1.5* 1.8*    Cardiac Enzymes: Recent Labs  Lab 06/20/24 1326  CKTOTAL 13*    HbA1C: No results found for: HGBA1C  CBG: No results for input(s): GLUCAP in the last 168 hours.   Past Medical History:  Christina Gutierrez,  has a past medical history of Cancer (HCC), Hepatocellular carcinoma (HCC) (08/22/2023), and Metabolic acidosis (08/21/2023).   Surgical History:   Past Surgical History:  Procedure Laterality Date   CESAREAN SECTION     IR IMAGING GUIDED PORT INSERTION  09/22/2023     Social History:   reports that Christina Gutierrez has never smoked. Christina Gutierrez has never used smokeless tobacco. Christina Gutierrez reports that Christina Gutierrez does not drink alcohol  and does not use drugs.   Family History:  Her family history is not on file.   Allergies Allergies[1]   Home Medications  Prior to Admission medications  Medication Sig Start Date End Date Taking? Authorizing Provider  OXYGEN Inhale 5 L/min into the lungs.   Yes [provider]  acetaminophen  (TYLENOL ) 325 MG tablet Take 2 tablets (650 mg total) by mouth  every 6 (six) hours as needed for mild pain (pain score 1-3) or fever (or Fever >/= 101). Patient not taking: Reported on 06/20/2024 05/16/24   Barbarann Nest, MD  ALPRAZolam  (XANAX ) 0.5 MG tablet Take 0.5 mg by mouth daily.    [provider]  ipratropium-albuterol  (DUONEB) 0.5-2.5 (3) MG/3ML SOLN Inhale 1 vial using nebulizer every six hours as needed 06/24/24     lidocaine -prilocaine  (EMLA ) cream Apply topically as needed. Patient not taking: Reported on 06/20/2024  05/13/24   [place], [name] PARAS, DO  morphine  (MS CONTIN ) 15 MG 12 hr tablet Take 1 tablet (15 mg total) by mouth every 12 (twelve) hours. 06/09/24     morphine  (MSIR) 15 MG tablet Take 1 tablet (15 mg total) by mouth every 3 (three) hours as needed for severe pain (pain score 7-10). 05/13/24   [place], [name] PARAS, DO  omeprazole  (PRILOSEC) 40 MG capsule Take 1 capsule (40 mg total) by mouth daily. 06/22/24   Raenelle Coria, MD  ondansetron  (ZOFRAN ) 8 MG tablet Take 1 tablet (8 mg total) by mouth every 8 (eight) hours as needed for nausea or vomiting. 06/22/24   Raenelle Coria, MD  polyethylene glycol powder (GLYCOLAX /MIRALAX ) 17 GM/SCOOP powder Take 17 g by mouth daily. Dissolve 1 capful (17g) in 4-8 ounces of liquid and take by mouth daily. 06/22/24 07/22/24  Raenelle Coria, MD  prochlorperazine  (COMPAZINE ) 10 MG tablet Take 1 tablet (10 mg total) by mouth every 6 (six) hours as needed for refractory nausea / vomiting. 06/22/24   Raenelle Coria, MD  senna-docusate (SENOKOT-S) 8.6-50 MG tablet Take 1 tablet by mouth 2 (two) times daily. 06/22/24 09/20/24  Raenelle Coria, MD  tenofovir  alafenamide (VEMLIDY ) 25 MG tablet Take 1 tablet (25 mg total) by mouth daily. 05/17/24   Fleeta Kathie Jomarie LOISE, MD       The patient is critically ill due to ARDS.  Critical care was necessary to treat or prevent imminent or life-threatening deterioration. Critical care time was spent by me on the following activities: development of a treatment plan with  the patient and/or surrogate as well as nursing, discussions with consultants, evaluation of the patient's response to treatment, examination of the patient, obtaining a history from the patient or surrogate, ordering and performing treatments and interventions, ordering and review of laboratory studies, ordering and review of radiographic studies, review of telemetry data including pulse oximetry, re-evaluation of patient's condition and participation in multidisciplinary rounds.   I personally spent 40 minutes providing critical care not including any separately billable procedures.  Marny Patch, MD [facility] 06/07/2024 7:44 AM                [1] No Known Allergies  "

## 2024-07-07 NOTE — Progress Notes (Deleted)
"  Subjective:  Chief Complaint: follow-up for chronic hepatitis b without hepatic coma in patient with [facility]   Patient ID: Christina Gutierrez, female    DOB: [birth year], 49 y.o.   MRN: [id number]  HPI  Past Medical History:  Diagnosis Date   Cancer [facility])    Hepatocellular carcinoma (HCC) 08/22/2023   Metabolic acidosis 08/21/2023    Past Surgical History:  Procedure Laterality Date   CESAREAN SECTION     IR IMAGING GUIDED PORT INSERTION  09/22/2023    No family history on file.    Social History   Socioeconomic History   Marital status: Married    Spouse name: Not on file   Number of children: Not on file   Years of education: Not on file   Highest education level: Not on file  Occupational History   Not on file  Tobacco Use   Smoking status: Never   Smokeless tobacco: Never  Vaping Use   Vaping status: Never Used  Substance and Sexual Activity   Alcohol  use: No   Drug use: No   Sexual activity: Not Currently  Other Topics Concern   Not on file  Social History Narrative   Not on file   Social Drivers of Health   Tobacco Use: Low Risk (06/20/2024)   Patient History    Smoking Tobacco Use: Never    Smokeless Tobacco Use: Never    Passive Exposure: Not on file  Financial Resource Strain: Not on file  Food Insecurity: No Food Insecurity (06/25/2024)   Epic    Worried About Programme Researcher, Broadcasting/film/video in the Last Year: Never true    Ran Out of Food in the Last Year: Never true  Transportation Needs: No Transportation Needs (06/25/2024)   Epic    Lack of Transportation (Medical): No    Lack of Transportation (Non-Medical): No  Physical Activity: Not on file  Stress: Not on file  Social Connections: Patient Declined (08/29/2023)   Social Connection and Isolation Panel    Frequency of Communication with Friends and Family: Patient declined    Frequency of Social Gatherings with Friends and Family: Patient declined    Attends Religious Services: Patient  declined    Database Administrator or Organizations: Patient declined    Attends Banker Meetings: Patient declined    Marital Status: Patient declined  Depression (PHQ2-9): Low Risk (05/20/2024)   Depression (PHQ2-9)    PHQ-2 Score: 0  Alcohol  Screen: Not on file  Housing: Low Risk (06/25/2024)   Epic    Unable to Pay for Housing in the Last Year: No    Number of Times Moved in the Last Year: 0    Homeless in the Last Year: No  Utilities: Not At Risk (06/25/2024)   Epic    Threatened with loss of utilities: No  Health Literacy: Not on file    Allergies[1]  Current Medications[2]   Review of Systems     Objective:   Physical Exam        Assessment & Plan:       [1] No Known Allergies [2] No current facility-administered medications for this visit. No current outpatient medications on file.  Facility-Administered Medications Ordered in Other Visits:    antiseptic oral rinse (BIOTENE) solution 15 mL, 15 mL, Topical, PRN, Mims, Lauren W, DO   artificial tears ophthalmic solution 1 drop, 1 drop, Both Eyes, QID PRN, Mims, Lauren W, DO   glycopyrrolate  (ROBINUL ) injection 0.2 mg, 0.2  mg, Intravenous, Q4H PRN, Mims, Lauren W, DO, 0.2 mg at 06/10/2024 9068   haloperidol  lactate (HALDOL ) injection 1 mg, 1 mg, Intravenous, Q4H PRN, Mims, Lauren W, DO   HYDROmorphone  (DILAUDID ) 50 mg in 50 mL NS (1mg /mL) premix infusion, 1-10 mg/hr, Intravenous, Continuous, Mims, Lauren W, DO, Last Rate: 2 mL/hr at 06/07/2024 1108, 2 mg/hr at 06/28/2024 1108   HYDROmorphone  (DILAUDID ) bolus via infusion 1-5 mg, 1-5 mg, Intravenous, Q15 min PRN, Mims, Lauren W, DO, 2 mg at 07/04/2024 1506   LORazepam  (ATIVAN ) injection 1-2 mg, 1-2 mg, Intravenous, Q4H PRN, Mims, Lauren W, DO   [DISCONTINUED] ondansetron  (ZOFRAN ) tablet 4 mg, 4 mg, Oral, Q6H PRN **OR** ondansetron  (ZOFRAN ) injection 4 mg, 4 mg, Intravenous, Q6H PRN, Zella, Mir M, MD, 4 mg at 06/30/2024 0248   pantoprazole  (PROTONIX )  injection 40 mg, 40 mg, Intravenous, Q24H, Ikramullah, Mir M, MD   prochlorperazine  (COMPAZINE ) injection 10 mg, 10 mg, Intravenous, Q6H PRN, Chavez, Abigail, NP, 10 mg at 06/21/2024 0444  "

## 2024-07-07 NOTE — Plan of Care (Signed)
?  Problem: Clinical Measurements: ?Goal: Respiratory complications will improve ?Outcome: Progressing ?Goal: Cardiovascular complication will be avoided ?Outcome: Progressing ?  ?Problem: Education: ?Goal: Knowledge of General Education information will improve ?Description: Including pain rating scale, medication(s)/side effects and non-pharmacologic comfort measures ?Outcome: Not Progressing ?  ?Problem: Nutrition: ?Goal: Adequate nutrition will be maintained ?Outcome: Not Progressing ?  ?

## 2024-07-07 NOTE — Progress Notes (Shared)
" °   06/17/2024 1000  Spiritual Encounters  Type of Visit Initial  Care provided to: Pt and family  Conversation partners present during encounter Nurse  Referral source Family  Reason for visit End-of-life  OnCall Visit Yes  Spiritual Framework  Presenting Themes Meaning/purpose/sources of inspiration;Impactful experiences and emotions  Community/Connection Family;Spiritual leader;Faith community;Friend(s)  Patient Stress Factors None identified  Family Stress Factors Loss;Major life changes;Loss of control;Other (Comment) (Anticipatiory grief)   Chaplain met with [id number] family at bedside (11) two family members collasped in hall and were attended to by staff.  Local faith leader pastor arrived and Chaplain and pasotror anointed patient with oil and prayed.   "

## 2024-07-07 NOTE — Progress Notes (Signed)
" ° ° °  Patient Name: Christina Gutierrez           DOB: 1975/09/16  MRN: [id number]       OVERNIGHT EVENT    Notified by RN that patient has expired at 2305.  2 RN verified. Patient was comfort care.   Family present at bedside during event.    Albany Winslow, DNP, ACNPC- AG [facility] [place]  "

## 2024-07-07 NOTE — Hospital Course (Addendum)
Brief Narrative:   49 year old with history of hepatitis B, HCC no longer being treated admitted for anorexia, hemoptysis and hypoxic respiratory failure.  She was discharged home with plans on 12/17 with plans for home with hospice after being treated for intractable nausea, dehydrated and poor oral intake.  Now admitted back again.  Initially on nonrebreather and BiPAP in the hospital. Has now transition to comfort care on Dilaudid  drip.  Anticipating in-hospital death Notified by RN that patient has expired at 2305  on 06/24/2024  Assessment & Plan:   Acute on chronic hypoxic respiratory failure ARDS/multifocal pneumonia Goals of care  GERD Advanced stage hepatocellular carcinoma Transaminitis  -Patient is currently comfort care on Dilaudid  drip.  It appears that she is going through the dying process at this time.  Family would like to wait a little bit longer to take off her high flow.  She remains DNR/DNI comfort care.  Seen by palliative care  DVT prophylaxis: CDs    Code Status: Limited: Do not attempt resuscitation (DNR) -DNR-LIMITED -Do Not Intubate/DNI  Family Communication:   Status is: Inpatient Remains inpatient appropriate because: Anticipate in-hospital death   PT Follow up Recs:   Subjective: Interpreter ID [id number]  Husband and multiple family members including children present at bedside.  Family does not have any further questions.  I did explain them that we should start taking heated high flow off as patient appears to be slowly actively dying.  They would like to wait a little bit longer   Examination:  General exam: Chronically and critically ill-appearing Respiratory system: Some bilateral rhonchi Cardiovascular system: S1 & S2 heard, RRR. No JVD, murmurs, rubs, gallops or clicks. No pedal edema. Gastrointestinal system: Abdomen is nondistended, soft and nontender. No organomegaly or masses felt. Normal bowel sounds heard. 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

## 2024-07-07 NOTE — Death Summary Note (Signed)
 "  DEATH SUMMARY   Patient Details  Name: Christina Gutierrez MRN: [id number] DOB: [id number] ERE:[facility], No Pcp Per Admission/Discharge Information   Admit Date:  July 10, 2024  Date of Death: Date of Death: 07/11/2024  Time of Death: Time of Death: Jul 18, 2304  Length of Stay: 2   Principle Cause of death: ARDS/multifocal pneumonia   Hospital Diagnoses: Principal Problem:   Acute hypoxic respiratory failure (HCC) Active Problems:   Shortness of breath   DNR (do not resuscitate)   ARDS (adult respiratory distress syndrome) (HCC)   ACP (advance care planning)   Acute on chronic respiratory failure with hypoxia (HCC)   End of life care   High risk medication use   Cancer associated pain   Hospital Course: Brief Narrative:   49 year old with history of hepatitis B, HCC no longer being treated admitted for anorexia, hemoptysis and hypoxic respiratory failure.  She was discharged home with plans on 12/17 with plans for home with hospice after being treated for intractable nausea, dehydrated and poor oral intake.  Now admitted back again.  Initially on nonrebreather and BiPAP in the hospital. Has now transition to comfort care on Dilaudid  drip.  Anticipating in-hospital death Notified by RN that patient has expired at 2305  on 2024/07/11  Assessment & Plan:   Acute on chronic hypoxic respiratory failure ARDS/multifocal pneumonia Goals of care  GERD Advanced stage hepatocellular carcinoma Transaminitis  -Patient is currently comfort care on Dilaudid  drip.  It appears that she is going through the dying process at this time.  Family would like to wait a little bit longer to take off her high flow.  She remains DNR/DNI comfort care.  Seen by palliative care  DVT prophylaxis: CDs    Code Status: Limited: Do not attempt resuscitation (DNR) -DNR-LIMITED -Do Not Intubate/DNI  Family Communication:   Status is: Inpatient Remains inpatient appropriate because: Anticipate in-hospital  death   PT Follow up Recs:   Subjective: Interpreter ID [phone]  Husband and multiple family members including children present at bedside.  Family does not have any further questions.  I did explain them that we should start taking heated high flow off as patient appears to be slowly actively dying.  They would like to wait a little bit longer   Examination:  General exam: Chronically and critically ill-appearing Respiratory system: Some bilateral rhonchi Cardiovascular system: S1 & S2 heard, RRR. No JVD, murmurs, rubs, gallops or clicks. No pedal edema. Gastrointestinal system: Abdomen is nondistended, soft and nontender. No organomegaly or masses felt. Normal bowel sounds heard. 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   Significant Diagnostic Studies: CT CHEST W CONTRAST Result Date: 07/10/2024 CLINICAL DATA:  Provided history: Hemoptysis Cough and shortness of breath. EXAM: CT CHEST WITH CONTRAST TECHNIQUE: Multidetector CT imaging of the chest was performed during intravenous contrast administration. RADIATION DOSE REDUCTION: This exam was performed according to the departmental dose-optimization program which includes automated exposure control, adjustment of the mA and/or kV according to patient size and/or use of iterative reconstruction technique. CONTRAST:  75mL OMNIPAQUE  IOHEXOL  300 MG/ML  SOLN COMPARISON:  Radiograph earlier today. Radiograph 06/20/2024 CT 01/18/2024 FINDINGS: Cardiovascular: The heart is enlarged. Trace atherosclerosis of the thoracic aorta. No aortic aneurysm. No pericardial effusion. No central pulmonary embolus, exam not tailored  to pulmonary artery assessment. Right chest port with tip in the  lower SVC. Mediastinum/Nodes: Motion artifact limitations. No enlarged mediastinal or hilar lymph nodes. Unremarkable appearance of the esophagus. No visible thyroid  nodule. Lungs/Pleura: Extensive bilateral consolidative and ground-glass opacities throughout both lungs, greatest involvement in the right upper and middle lobes. Multiple air bronchograms. Small bilateral pleural effusions. Calcified granuloma in the left lower lobe. Motion limits more detailed parenchymal assessment. Upper Abdomen: Heterogeneous enlarged liver, incompletely included in the field of view. Hepatic masses better appreciated on prior abdominopelvic CT. Musculoskeletal: Motion artifact limitations. Allowing for motion, no acute osseous findings. Congenital fusion of T3-T4. mild body wall edema. IMPRESSION: 1. Extensive bilateral consolidative and ground-glass opacities throughout both lungs, greatest involvement in the right upper and middle lobes. Findings may be related to multifocal pneumonia, pulmonary hemorrhage or ARDS. Pulmonary edema is felt less likely although also a consideration. 2. Small bilateral pleural effusions. 3. Cardiomegaly. 4. Heterogeneous enlarged liver, incompletely included in the field of view. Hepatic masses better appreciated on prior abdominopelvic CT. Aortic Atherosclerosis (ICD10-I70.0). Electronically Signed   By: Andrea Gasman M.D.   On: 06/25/2024 18:21   DG Chest Portable 1 View Result Date: 06/25/2024 CLINICAL DATA:  Shortness of breath and cough. EXAM: PORTABLE CHEST 1 VIEW COMPARISON:  06/20/2024 FINDINGS: Right chest port in place. Development of diffuse heterogeneous bilateral lung opacities. Small bilateral pleural effusions. Stable heart size and mediastinal contours. No pneumothorax. On limited assessment, no acute osseous findings. IMPRESSION: Development of diffuse heterogeneous bilateral lung opacities over the last 5 days. Differential considerations include multifocal pneumonia,  pulmonary edema, or ARDS. Small bilateral pleural effusions. Electronically Signed   By: Andrea Gasman M.D.   On: 06/25/2024 15:25   DG Chest Portable 1 View Result Date: 06/20/2024 CLINICAL DATA:  Fatigue. EXAM: PORTABLE CHEST 1 VIEW COMPARISON:  Chest radiograph dated 04/16/2024. FINDINGS: Right-sided Port-A-Cath with tip at the cavoatrial junction. There is shallow inspiration with bibasilar atelectasis. Pneumonia is not excluded no pleural effusion pneumothorax. Stable cardiac silhouette no acute osseous pathology. IMPRESSION: Shallow inspiration with bibasilar atelectasis. Pneumonia is not excluded. Electronically Signed   By: Vanetta Chou M.D.   On: 06/20/2024 14:04   CT ABDOMEN PELVIS W CONTRAST Result Date: 06/20/2024 EXAM: CT ABDOMEN AND PELVIS WITH CONTRAST 06/20/2024 01:23:05 PM TECHNIQUE: CT of the abdomen and pelvis was performed with the administration of 100 mL of iohexol  (OMNIPAQUE ) 300 MG/ML solution. Multiplanar reformatted images are provided for review. Automated exposure control, iterative reconstruction, and/or weight-based adjustment of the mA/kV was utilized to reduce the radiation dose to as low as reasonably achievable. COMPARISON: Prior study dated 05/12/2024. CLINICAL HISTORY: Emesis with known masses in abdomen pushing on stomach. Rule out obstruction. Body aches, nausea, and poor appetite for 3 days. History of liver cancer, not currently undergoing treatment. FINDINGS: LOWER CHEST: Coarse and somewhat nodular interstitial changes in the lung bases would be suspicious for lymphangitis tumor spread. Infectious or inflammatory etiologies could also cause this. Calcified granuloma in the left lung base. Small right pleural effusion with basilar atelectasis. Cardiac enlargement. LIVER: There is diffuse heterogeneous enlargement of the liver, which is filled with heterogeneous and poorly defined tumor masses replacing the hepatic parenchyma. The largest mass in the right lobe,  it has been measured for correlation and today measures 14.1 cm, compared with 15.4 cm previously. This likely indicates no change in size. Abdominal contents are displaced by the enlarged liver. Small subcapsular fluid along the right lobe of the liver may  be mildly increased since prior study. This is probably reactive, but could possibly indicate subcapsular hematoma. Based on the size of the liver, the lesions would be at risk for rupture. GALLBLADDER AND BILE DUCTS: Cholelithiasis with contracted gallbladder. No bile duct dilatation. SPLEEN: The spleen size is normal. There is a subcentimeter hyperattenuating lesion in the spleen probably representing hemangioma. PANCREAS: No acute abnormality. ADRENAL GLANDS: No acute abnormality. KIDNEYS, URETERS AND BLADDER: The bladder and kidneys are unremarkable. No stones in the kidneys or ureters. No hydronephrosis. No perinephric or periureteral stranding. GI AND BOWEL: Stomach demonstrates no acute abnormality. The ascending colon and splenic flexure, as well as some loops of small bowel, demonstrate wall dilatation. This could represent portal hypertensive colopathy or enterocolitis. No pneumatosis. There is no bowel obstruction. PERITONEUM AND RETROPERITONEUM: Small amount of free fluid in the abdomen and pelvis likely represents ascites. No increased density to suggest free air hemorrhage. VASCULATURE: Multiple upper abdominal varices are present. Mesenteric and left lower quadrant varices. Umbilical vein varices. The aorta is normal in caliber. LYMPH NODES: No significant retroperitoneal lymphadenopathy. REPRODUCTIVE ORGANS: The uterus is somewhat enlarged, possibly indicating uterine fibroids. No abnormal adnexal masses. BONES AND SOFT TISSUES: No acute osseous abnormality. No focal soft tissue abnormality. IMPRESSION: 1. Diffuse heterogeneous hepatic involvement by multifocal tumor with marked hepatomegaly; dominant right lobe lesion 14.1 cm, not significantly  changed from 15.4 cm previously. No evidence of new metastatic disease within the abdomen or pelvis. 2. Suspected portal hypertension with multiple varices and small-volume ascites. 3. Small right hepatic subcapsular fluid, favored reactive; evolving subcapsular hematoma is less likely but not excluded given tumor burden and risk of rupture. 4. Bowel wall thickening involving the ascending colon, splenic flexure, and segments of small bowel without pneumatosis, which may reflect portal hypertensive colopathy or enterocolitis; no CT evidence of mechanical obstruction. 5. Cholelithiasis with contracted gallbladder. No biliary dilatation. Electronically signed by: Elsie Gravely MD 06/20/2024 01:47 PM EST RP Workstation: [id number]    Microbiology: Recent Results (from the past 240 hours)  Resp panel by RT-PCR (RSV, Flu A&B, Covid) Anterior Nasal Swab     Status: None   Collection Time: 06/20/24  1:10 PM   Specimen: Anterior Nasal Swab  Result Value Ref Range Status   SARS Coronavirus 2 by RT PCR NEGATIVE NEGATIVE Final    Comment: (NOTE) SARS-CoV-2 target nucleic acids are NOT DETECTED.  The SARS-CoV-2 RNA is generally detectable in upper respiratory specimens during the acute phase of infection. The lowest concentration of SARS-CoV-2 viral copies this assay can detect is 138 copies/mL. A negative result does not preclude SARS-Cov-2 infection and should not be used as the sole basis for treatment or other patient management decisions. A negative result may occur with  improper specimen collection/handling, submission of specimen other than nasopharyngeal swab, presence of viral mutation(s) within the areas targeted by this assay, and inadequate number of viral copies(<138 copies/mL). A negative result must be combined with clinical observations, patient history, and epidemiological information. The expected result is Negative.  Fact Sheet for Patients:   bloggercourse.com  Fact Sheet for Healthcare Providers:  seriousbroker.it  This test is no t yet approved or cleared by the [place]  FDA and  has been authorized for detection and/or diagnosis of SARS-CoV-2 by FDA under an Emergency Use Authorization (EUA). This EUA will remain  in effect (meaning this test can be used) for the duration of the COVID-19 declaration under Section 564(b)(1) of the Act, 21 U.S.C.section 360bbb-3(b)(1), unless the authorization  is terminated  or revoked sooner.       Influenza A by PCR NEGATIVE NEGATIVE Final   Influenza B by PCR NEGATIVE NEGATIVE Final    Comment: (NOTE) The Xpert Xpress SARS-CoV-2/FLU/RSV plus assay is intended as an aid in the diagnosis of influenza from Nasopharyngeal swab specimens and should not be used as a sole basis for treatment. Nasal washings and aspirates are unacceptable for Xpert Xpress SARS-CoV-2/FLU/RSV testing.  Fact Sheet for Patients: bloggercourse.com  Fact Sheet for Healthcare Providers: seriousbroker.it  This test is not yet approved or cleared by the [place]  FDA and has been authorized for detection and/or diagnosis of SARS-CoV-2 by FDA under an Emergency Use Authorization (EUA). This EUA will remain in effect (meaning this test can be used) for the duration of the COVID-19 declaration under Section 564(b)(1) of the Act, 21 U.S.C. section 360bbb-3(b)(1), unless the authorization is terminated or revoked.     Resp Syncytial Virus by PCR NEGATIVE NEGATIVE Final    Comment: (NOTE) Fact Sheet for Patients: bloggercourse.com  Fact Sheet for Healthcare Providers: seriousbroker.it  This test is not yet approved or cleared by the [place]  FDA and has been authorized for detection and/or diagnosis of SARS-CoV-2 by FDA under an Emergency Use  Authorization (EUA). This EUA will remain in effect (meaning this test can be used) for the duration of the COVID-19 declaration under Section 564(b)(1) of the Act, 21 U.S.C. section 360bbb-3(b)(1), unless the authorization is terminated or revoked.  Performed at [facility], [address] [place], [place] [address]     Time spent: 25 minutes  Signed: Burgess JAYSON Dare, MD Jul 14, 2024   "

## 2024-07-07 NOTE — Progress Notes (Signed)
" PROGRESS NOTE    Marlea Gambill  FMW:[id number] DOB: [birth year] DOA: 06/25/2024 PCP: Patient, No Pcp Per    Brief Narrative:   49 year old with history of hepatitis B, HCC no longer being treated admitted for anorexia, hemoptysis and hypoxic respiratory failure.  She was discharged home with plans on 12/17 with plans for home with hospice after being treated for intractable nausea, dehydrated and poor oral intake.  Now admitted back again.  Initially on nonrebreather and BiPAP in the hospital. Has now transition to comfort care on Dilaudid  drip.  Anticipating in-hospital death  Assessment & Plan:   Acute on chronic hypoxic respiratory failure ARDS/multifocal pneumonia Goals of care  GERD Advanced stage hepatocellular carcinoma Transaminitis  -Patient is currently comfort care on Dilaudid  drip.  It appears that she is going through the dying process at this time.  Family would like to wait a little bit longer to take off her high flow.  She remains DNR/DNI comfort care.  Seen by palliative care  DVT prophylaxis: CDs    Code Status: Limited: Do not attempt resuscitation (DNR) -DNR-LIMITED -Do Not Intubate/DNI  Family Communication:   Status is: Inpatient Remains inpatient appropriate because: Anticipate in-hospital death   PT Follow up Recs:   Subjective: Interpreter ID [id number]  Husband and multiple family members including children present at bedside.  Family does not have any further questions.  I did explain them that we should start taking heated high flow off as patient appears to be slowly actively dying.  They would like to wait a little bit longer   Examination:  General exam: Chronically and critically ill-appearing Respiratory system: Some bilateral rhonchi Cardiovascular system: S1 & S2 heard, RRR. No JVD, murmurs, rubs, gallops or clicks. No pedal edema. Gastrointestinal system: Abdomen is nondistended, soft and nontender. No organomegaly or masses  felt. Normal bowel sounds heard. 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 Yes with Assist; Fluid consistency: Thin  Diet effective now       Question Answer Comment  Room service appropriate? Yes with Assist   Fluid consistency: Thin      06/21/2024 0821            Objective: Vitals:   06/23/2024 0742 06/23/2024 0759 06/22/2024 0800 06/28/2024 0812  BP:   117/60   Pulse: 89 96 94   Resp: (!) 25 (!) 24 (!) 27   Temp:    98.5 F (36.9 C)  TempSrc:    Axillary  SpO2: (!) 87% (!) 88% (!) 89%   Weight:      Height:        Intake/Output Summary (Last 24 hours) at 06/22/2024 1123 Last data filed at 07/05/2024 1108 Gross per 24 hour  Intake 405.94 ml  Output --  Net 405.94 ml   Filed Weights   06/25/24 1450  Weight: 46 kg    Scheduled Meds:  ipratropium-albuterol   3 mL Nebulization QID   pantoprazole  (PROTONIX ) IV  40 mg Intravenous Q24H   Continuous Infusions:  HYDROmorphone  2 mg/hr (07/04/2024 1108)    Nutritional status     Body mass index is 19.81 kg/m.  Data Reviewed:   CBC: Recent Labs  Lab  06/20/24 1227 06/21/24 0539 06/21/24 1838 06/25/24 1456 06/18/2024 0250  WBC 5.7 4.7  --  9.6 11.9*  NEUTROABS  --   --   --  7.8*  --   HGB 8.0* 6.7* 8.0* 8.9* 8.7*  HCT 24.4* 20.6* 24.5* 26.3* 26.5*  MCV 94.6 95.8  --  94.3 95.3  PLT 230 193  --  225 214   Basic Metabolic Panel: Recent Labs  Lab 06/20/24 1227 06/21/24 0539 06/25/24 1456 06/12/2024 0250  NA 133* 137 129* 131*  K 4.3 3.9 4.7 5.2*  CL 98 108 99 101  CO2 22 20* 21* 21*  GLUCOSE 101* 81 105* 116*  BUN 6 6 6 8   CREATININE 0.52 0.45 0.41* 0.38*  CALCIUM 9.9 8.1* 9.0 9.4   GFR: Estimated Creatinine Clearance: 63.1 mL/min (A) (by C-G formula based on SCr  of 0.38 mg/dL (L)). Liver Function Tests: Recent Labs  Lab 06/20/24 1227 06/21/24 0539 06/25/24 1456  AST 254* 212* 261*  ALT 109* 89* 109*  ALKPHOS 134* 100 120  BILITOT 2.4* 1.8* 3.1*  PROT 7.5 5.9* 6.7  ALBUMIN 3.2* 2.7* 3.0*   Recent Labs  Lab 06/20/24 1227  LIPASE 11   Recent Labs  Lab 06/20/24 1335  AMMONIA 74*   Coagulation Profile: Recent Labs  Lab 06/20/24 1336 06/25/24 1456  INR 1.5* 1.8*   Cardiac Enzymes: Recent Labs  Lab 06/20/24 1326  CKTOTAL 13*   BNP (last 3 results) Recent Labs    06/25/24 1714  PROBNP 358.0*   HbA1C: No results for input(s): HGBA1C in the last 72 hours. CBG: No results for input(s): GLUCAP in the last 168 hours. Lipid Profile: No results for input(s): CHOL, HDL, LDLCALC, TRIG, CHOLHDL, LDLDIRECT in the last 72 hours. Thyroid  Function Tests: No results for input(s): TSH, T4TOTAL, FREET4, T3FREE, THYROIDAB in the last 72 hours. Anemia Panel: No results for input(s): VITAMINB12, FOLATE, FERRITIN, TIBC, IRON, RETICCTPCT in the last 72 hours. Sepsis Labs: Recent Labs  Lab 06/25/24 1451 06/25/24 1624 06/25/24 1720  PROCALCITON  --   --  0.15  LATICACIDVEN 2.2* 1.5  --     Recent Results (from the past 240 hours)  Resp panel by RT-PCR (RSV, Flu A&B, Covid) Anterior Nasal Swab     Status: None   Collection Time: 06/20/24  1:10 PM   Specimen: Anterior Nasal Swab  Result Value Ref Range Status   SARS Coronavirus 2 by RT PCR NEGATIVE NEGATIVE Final    Comment: (NOTE) SARS-CoV-2 target nucleic acids are NOT DETECTED.  The SARS-CoV-2 RNA is generally detectable in upper respiratory specimens during the acute phase of infection. The lowest concentration of SARS-CoV-2 viral copies this assay can detect is 138 copies/mL. A negative result does not preclude SARS-Cov-2 infection and should not be used as the sole basis for treatment or other patient management decisions. A negative  result may occur with  improper specimen collection/handling, submission of specimen other than nasopharyngeal swab, presence of viral mutation(s) within the areas targeted by this assay, and inadequate number of viral copies(<138 copies/mL). A negative result must be combined with clinical observations, patient history, and epidemiological information. The expected result is Negative.  Fact Sheet for Patients:  bloggercourse.com  Fact Sheet for Healthcare Providers:  seriousbroker.it  This test is no t yet approved or cleared by the [place]  FDA and  has been authorized for detection and/or diagnosis of SARS-CoV-2 by FDA under an Emergency Use Authorization (EUA). This EUA will remain  in  effect (meaning this test can be used) for the duration of the COVID-19 declaration under Section 564(b)(1) of the Act, 21 U.S.C.section 360bbb-3(b)(1), unless the authorization is terminated  or revoked sooner.       Influenza A by PCR NEGATIVE NEGATIVE Final   Influenza B by PCR NEGATIVE NEGATIVE Final    Comment: (NOTE) The Xpert Xpress SARS-CoV-2/FLU/RSV plus assay is intended as an aid in the diagnosis of influenza from Nasopharyngeal swab specimens and should not be used as a sole basis for treatment. Nasal washings and aspirates are unacceptable for Xpert Xpress SARS-CoV-2/FLU/RSV testing.  Fact Sheet for Patients: bloggercourse.com  Fact Sheet for Healthcare Providers: seriousbroker.it  This test is not yet approved or cleared by the [place]  FDA and has been authorized for detection and/or diagnosis of SARS-CoV-2 by FDA under an Emergency Use Authorization (EUA). This EUA will remain in effect (meaning this test can be used) for the duration of the COVID-19 declaration under Section 564(b)(1) of the Act, 21 U.S.C. section 360bbb-3(b)(1), unless the authorization is  terminated or revoked.     Resp Syncytial Virus by PCR NEGATIVE NEGATIVE Final    Comment: (NOTE) Fact Sheet for Patients: bloggercourse.com  Fact Sheet for Healthcare Providers: seriousbroker.it  This test is not yet approved or cleared by the [place]  FDA and has been authorized for detection and/or diagnosis of SARS-CoV-2 by FDA under an Emergency Use Authorization (EUA). This EUA will remain in effect (meaning this test can be used) for the duration of the COVID-19 declaration under Section 564(b)(1) of the Act, 21 U.S.C. section 360bbb-3(b)(1), unless the authorization is terminated or revoked.  Performed at [facility], [address], [place], [place] [address]          Radiology Studies: CT CHEST W CONTRAST Result Date: 06/25/2024 CLINICAL DATA:  Provided history: Hemoptysis Cough and shortness of breath. EXAM: CT CHEST WITH CONTRAST TECHNIQUE: Multidetector CT imaging of the chest was performed during intravenous contrast administration. RADIATION DOSE REDUCTION: This exam was performed according to the departmental dose-optimization program which includes automated exposure control, adjustment of the mA and/or kV according to patient size and/or use of iterative reconstruction technique. CONTRAST:  75mL OMNIPAQUE  IOHEXOL  300 MG/ML  SOLN COMPARISON:  Radiograph earlier today. Radiograph 06/20/2024 CT 01/18/2024 FINDINGS: Cardiovascular: The heart is enlarged. Trace atherosclerosis of the thoracic aorta. No aortic aneurysm. No pericardial effusion. No central pulmonary embolus, exam not tailored to pulmonary artery assessment. Right chest port with tip in the lower SVC. Mediastinum/Nodes: Motion artifact limitations. No enlarged mediastinal or hilar lymph nodes. Unremarkable appearance of the esophagus. No visible thyroid  nodule. Lungs/Pleura: Extensive bilateral consolidative and ground-glass opacities  throughout both lungs, greatest involvement in the right upper and middle lobes. Multiple air bronchograms. Small bilateral pleural effusions. Calcified granuloma in the left lower lobe. Motion limits more detailed parenchymal assessment. Upper Abdomen: Heterogeneous enlarged liver, incompletely included in the field of view. Hepatic masses better appreciated on prior abdominopelvic CT. Musculoskeletal: Motion artifact limitations. Allowing for motion, no acute osseous findings. Congenital fusion of T3-T4. mild body wall edema. IMPRESSION: 1. Extensive bilateral consolidative and ground-glass opacities throughout both lungs, greatest involvement in the right upper and middle lobes. Findings may be related to multifocal pneumonia, pulmonary hemorrhage or ARDS. Pulmonary edema is felt less likely although also a consideration. 2. Small bilateral pleural effusions. 3. Cardiomegaly. 4. Heterogeneous enlarged liver, incompletely included in the field of view. Hepatic masses better appreciated on prior abdominopelvic CT. Aortic Atherosclerosis (ICD10-I70.0).  Electronically Signed   By: Andrea Gasman M.D.   On: 06/25/2024 18:21   DG Chest Portable 1 View Result Date: 06/25/2024 CLINICAL DATA:  Shortness of breath and cough. EXAM: PORTABLE CHEST 1 VIEW COMPARISON:  06/20/2024 FINDINGS: Right chest port in place. Development of diffuse heterogeneous bilateral lung opacities. Small bilateral pleural effusions. Stable heart size and mediastinal contours. No pneumothorax. On limited assessment, no acute osseous findings. IMPRESSION: Development of diffuse heterogeneous bilateral lung opacities over the last 5 days. Differential considerations include multifocal pneumonia, pulmonary edema, or ARDS. Small bilateral pleural effusions. Electronically Signed   By: Andrea Gasman M.D.   On: 06/25/2024 15:25           LOS: 1 day   Time spent= 35 mins    Burgess JAYSON Dare, MD [facility]  If 7PM-7AM, please  contact night-coverage  06/21/2024, 11:23 AM  "

## 2024-07-07 NOTE — Consult Note (Signed)
" Consultation Note Date: 06/18/2024   Patient Name: Christina Gutierrez  DOB: July 26, 1975  MRN: [id number]  Age / Sex: 49 y.o., female   PCP: Patient, No Pcp Per Referring Physician: Caleen Burgess BROCKS, MD  Reason for Consultation: Establishing goals of care     Chief Complaint/History of Present Illness:   Patient is a 49 year old female with a past medical history of hepatitis B and  hepatocellular carcinoma no longer on cancer therapies who was admitted on 06/25/2024 for management of anorexia, coughing up blood, and hypoxic respiratory failure.  Patient had just been discharged from the hospital on 06/22/2024 with hospice support via [facility].  During hospitalization, patient receiving management for acute on chronic hypoxic respiratory failure in the setting of concerns for ARDS with multifocal opacities bilaterally.  Palliative medicine team consulted to assist with complex medical decision making. Of note patient known to palliative medicine team from prior admissions and outpatient care.  Extensive review of EMR including recent documentation from hospitalist and PCCM provider.  Discussed care with hospitalist and PCCM provider to coordinate care.  Reviewed recent CMP noting patient's bilirubin elevated at 3.1.  Also reviewed CBC noting WBC elevated at 11.9 and hemoglobin low at 8.7.  Personally reviewed recent chest CT noting extensive bilateral consolidations and ground glass opacities throughout both lungs which are concerning to be related to multifocal pneumonia, pulmonary hemorrhage, or ARDS. PPCM provider did speak with patient and family yesterday and CODE STATUS was appropriately changed to DNR/DNI. Discussed care with bedside RN for medical updates.  Patient's respiratory status rapidly deteriorating and patient now needing HHFNC 60 L/min with FiO2 100%.  ------------------------------------------------------------------------------------------------------------- Advance Care  Planning Conversation  Pertinent diagnosis: Hepatitis B, hepatocellular carcinoma, ARDS with concerns for multifocal pneumonia versus pulmonary hemorrhage, acute respiratory failure, elevated LFTs  The patient and family consented to a voluntary Advance Care Planning Conversation in person. Individuals present for the conversation: Patient unable to participate in complex medical decision making due to medical status.  This provider discussed care with patient's husband at bedside.  Summary of the conversation:  Presented to bedside to see patient.  Used Software engineer, Curlee [phone].  Patient laying in bed and appears critically ill and to be dying.  Patient's husband present at bedside.  Introduced myself as a member of the palliative medicine team and my role in patient's medical journey.  Discussed that patient is in the process of dying due to her underlying cancer and her worsening respiratory status because of this.  Discussed transition to comfort focused care and providing medications for symptom management at end-of-life.  Husband, [name], agreeing with transition to comfort focused care at this time.  Noted going to start continuous opioid infusion for increased work of breathing.  Encouraged that he reach out to any family members who want to come to bedside to say goodbye. Chaplain was paged for emotional support.  Outcome of the conversations and/or documents completed:  Transition to full comfort focused care at this time.  Hospital death anticipated.  I spent 25 minutes providing separately identifiable ACP services with the patient and/or surrogate decision maker in a voluntary, in-person conversation discussing the patient's wishes and goals as detailed in the above note.  Tinnie Radar, DO Palliative Medicine Provider  -------------------------------------------------------------------------------------------------------------  Discussed care with hospitalist, PCCM  provider, and bedside RN about transition to full comfort focused care at this time.  In-hospital death anticipated.  Primary Diagnoses  Present on Admission:  Acute hypoxic respiratory failure (HCC)  Past Medical History:  Diagnosis Date   Cancer (HCC)    Hepatocellular carcinoma (HCC) 08/22/2023   Metabolic acidosis 08/21/2023   Social History   Socioeconomic History   Marital status: Married    Spouse name: Not on file   Number of children: Not on file   Years of education: Not on file   Highest education level: Not on file  Occupational History   Not on file  Tobacco Use   Smoking status: Never   Smokeless tobacco: Never  Vaping Use   Vaping status: Never Used  Substance and Sexual Activity   Alcohol  use: No   Drug use: No   Sexual activity: Not Currently  Other Topics Concern   Not on file  Social History Narrative   Not on file   Social Drivers of Health   Tobacco Use: Low Risk (06/20/2024)   Patient History    Smoking Tobacco Use: Never    Smokeless Tobacco Use: Never    Passive Exposure: Not on file  Financial Resource Strain: Not on file  Food Insecurity: No Food Insecurity (06/25/2024)   Epic    Worried About Programme Researcher, Broadcasting/film/video in the Last Year: Never true    Ran Out of Food in the Last Year: Never true  Transportation Needs: No Transportation Needs (06/25/2024)   Epic    Lack of Transportation (Medical): No    Lack of Transportation (Non-Medical): No  Physical Activity: Not on file  Stress: Not on file  Social Connections: Patient Declined (08/29/2023)   Social Connection and Isolation Panel    Frequency of Communication with Friends and Family: Patient declined    Frequency of Social Gatherings with Friends and Family: Patient declined    Attends Religious Services: Patient declined    Database Administrator or Organizations: Patient declined    Attends Banker Meetings: Patient declined    Marital Status: Patient declined   Depression (PHQ2-9): Low Risk (05/20/2024)   Depression (PHQ2-9)    PHQ-2 Score: 0  Alcohol  Screen: Not on file  Housing: Low Risk (06/25/2024)   Epic    Unable to Pay for Housing in the Last Year: No    Number of Times Moved in the Last Year: 0    Homeless in the Last Year: No  Utilities: Not At Risk (06/25/2024)   Epic    Threatened with loss of utilities: No  Health Literacy: Not on file   No family history on file. Scheduled Meds:  ipratropium-albuterol   3 mL Nebulization QID   methylPREDNISolone  (SOLU-MEDROL ) injection  40 mg Intravenous Q12H   pantoprazole  (PROTONIX ) IV  40 mg Intravenous Q24H   Continuous Infusions:  ceFEPime  (MAXIPIME ) IV Stopped (06/23/2024 0213)   vancomycin      PRN Meds:.albuterol , chlorpheniramine-HYDROcodone , HYDROmorphone  (DILAUDID ) injection, LORazepam , [DISCONTINUED] ondansetron  **OR** ondansetron  (ZOFRAN ) IV, prochlorperazine  Allergies[1] CBC:    Component Value Date/Time   WBC 11.9 (H) 06/14/2024 0250   HGB 8.7 (L) 07/05/2024 0250   HGB 7.5 (L) 05/20/2024 1515   HCT 26.5 (L) 06/17/2024 0250   PLT 214 07/02/2024 0250   PLT 203 05/20/2024 1515   MCV 95.3 06/07/2024 0250   NEUTROABS 7.8 (H) 06/25/2024 1456   LYMPHSABS 0.4 (L) 06/25/2024 1456   MONOABS 0.9 06/25/2024 1456   EOSABS 0.3 06/25/2024 1456   BASOSABS 0.1 06/25/2024 1456   Comprehensive Metabolic Panel:    Component Value Date/Time   NA 131 (L) 07/04/2024 0250   K 5.2 (H) 06/13/2024 0250  CL 101 06/09/2024 0250   CO2 21 (L) 06/28/2024 0250   BUN 8 07/03/2024 0250   CREATININE 0.38 (L) 06/10/2024 0250   CREATININE 0.42 (L) 05/20/2024 1515   GLUCOSE 116 (H) 06/29/2024 0250   CALCIUM 9.4 06/07/2024 0250   AST 261 (H) 06/25/2024 1456   AST 180 (HH) 05/20/2024 1515   ALT 109 (H) 06/25/2024 1456   ALT 104 (H) 05/20/2024 1515   ALKPHOS 120 06/25/2024 1456   BILITOT 3.1 (H) 06/25/2024 1456   BILITOT 1.4 (H) 05/20/2024 1515   PROT 6.7 06/25/2024 1456   ALBUMIN 3.0 (L)  06/25/2024 1456    Physical Exam: Vital Signs: BP (!) 99/47 (BP Location: Left Arm)   Pulse 72   Temp 97.6 F (36.4 C) (Axillary)   Resp 18   Ht 5' (1.524 m)   Wt 46 kg   SpO2 93%   BMI 19.81 kg/m  SpO2: SpO2: 93 % O2 Device: O2 Device: CPAP O2 Flow Rate: O2 Flow Rate (L/min): 15 L/min Intake/output summary:  Intake/Output Summary (Last 24 hours) at 06/23/2024 0645 Last data filed at 06/30/2024 0400 Gross per 24 hour  Intake 400 ml  Output --  Net 400 ml   LBM: Last BM Date :  (pta) Baseline Weight: Weight: 46 kg Most recent weight: Weight: 46 kg  General: Ill-appearing, increased work of breathing, cachectic, frail HENT: Dry cracked lips with blood inside of mouth Cardiovascular: RRR Respiratory: increased work of breathing noted, on HHFNC 60 L/min with FiO2 100% Abdomen: distended Neuro: Lethargic         Palliative Performance Scale: 10%              Additional Data Reviewed: Recent Labs    06/25/24 1456 06/14/2024 0250  WBC 9.6 11.9*  HGB 8.9* 8.7*  PLT 225 214  NA 129* 131*  BUN 6 8  CREATININE 0.41* 0.38*    Imaging: CT CHEST W CONTRAST CLINICAL DATA:  Provided history: Hemoptysis  Cough and shortness of breath.  EXAM: CT CHEST WITH CONTRAST  TECHNIQUE: Multidetector CT imaging of the chest was performed during intravenous contrast administration.  RADIATION DOSE REDUCTION: This exam was performed according to the departmental dose-optimization program which includes automated exposure control, adjustment of the mA and/or kV according to patient size and/or use of iterative reconstruction technique.  CONTRAST:  75mL OMNIPAQUE  IOHEXOL  300 MG/ML  SOLN  COMPARISON:  Radiograph earlier today. Radiograph 06/20/2024 CT 01/18/2024  FINDINGS: Cardiovascular: The heart is enlarged. Trace atherosclerosis of the thoracic aorta. No aortic aneurysm. No pericardial effusion. No central pulmonary embolus, exam not tailored to pulmonary  artery assessment. Right chest port with tip in the lower SVC.  Mediastinum/Nodes: Motion artifact limitations. No enlarged mediastinal or hilar lymph nodes. Unremarkable appearance of the esophagus. No visible thyroid  nodule.  Lungs/Pleura: Extensive bilateral consolidative and ground-glass opacities throughout both lungs, greatest involvement in the right upper and middle lobes. Multiple air bronchograms. Small bilateral pleural effusions. Calcified granuloma in the left lower lobe. Motion limits more detailed parenchymal assessment.  Upper Abdomen: Heterogeneous enlarged liver, incompletely included in the field of view. Hepatic masses better appreciated on prior abdominopelvic CT.  Musculoskeletal: Motion artifact limitations. Allowing for motion, no acute osseous findings. Congenital fusion of T3-T4. mild body wall edema.  IMPRESSION: 1. Extensive bilateral consolidative and ground-glass opacities throughout both lungs, greatest involvement in the right upper and middle lobes. Findings may be related to multifocal pneumonia, pulmonary hemorrhage or ARDS. Pulmonary edema is felt less likely  although also a consideration. 2. Small bilateral pleural effusions. 3. Cardiomegaly. 4. Heterogeneous enlarged liver, incompletely included in the field of view. Hepatic masses better appreciated on prior abdominopelvic CT.  Aortic Atherosclerosis (ICD10-I70.0).  Electronically Signed   By: Andrea Gasman M.D.   On: 06/25/2024 18:21 DG Chest Portable 1 View CLINICAL DATA:  Shortness of breath and cough.  EXAM: PORTABLE CHEST 1 VIEW  COMPARISON:  06/20/2024  FINDINGS: Right chest port in place. Development of diffuse heterogeneous bilateral lung opacities. Small bilateral pleural effusions. Stable heart size and mediastinal contours. No pneumothorax. On limited assessment, no acute osseous findings.  IMPRESSION: Development of diffuse heterogeneous bilateral lung  opacities over the last 5 days. Differential considerations include multifocal pneumonia, pulmonary edema, or ARDS. Small bilateral pleural effusions.  Electronically Signed   By: Andrea Gasman M.D.   On: 06/25/2024 15:25    I personally reviewed recent imaging.   Palliative Care Assessment and Plan Summary of Established Goals of Care and Medical Treatment Preferences   Patient is a 49 year old female with a past medical history of hepatitis B and  hepatocellular carcinoma no longer on cancer therapies who was admitted on 06/25/2024 for management of anorexia, coughing up blood, and hypoxic respiratory failure.  Patient had just been discharged from the hospital on 06/22/2024 with hospice support via [facility].  During hospitalization, patient receiving management for acute on chronic hypoxic respiratory failure in the setting of concerns for ARDS with multifocal opacities bilaterally.  Palliative medicine team consulted to assist with complex medical decision making. Of note patient known to palliative medicine team from prior admissions and outpatient care.  # Complex medical decision making/goals of care  -Patient unable to participate in medical decision making secondary to mental status.  -Spoke with patient's husband, Jeral Plaza, at bedside as detailed above in HPI.  Discussed in light of patient's underlying cancer, patient in acute respiratory failure and appears to be dying.  Discussed transition to comfort focused care to manage symptoms at end-of-life and husband was supportive of the transition to comfort.  Encouraged all family members come to bed as soon as possible to say goodbye.  In hospital death anticipated.  Palliative medicine team following along with patient's medical journey.  -At this time we will discontinue interventions that are no longer focused on comfort such as IV fluids, imaging, or lab work.  Will instead focus on symptom management of pain, dyspnea, and  agitation in the setting of end-of-life care.    Code Status: Do not attempt resuscitation (DNR) - Comfort care  # Symptom management Patient is receiving these palliative interventions for symptom management with an intent to improve quality of life.     -Pain/Dyspnea, acute in the setting of end-of-life care                               -Start IV hydromorphone  titratable continuous infusion with bolus dosing.  Will continue to adjust based on patient's severe symptom burden.                  -Anxiety/agitation, in the setting of end-of-life care                               -Start IV Ativan  1 mg every 4 hours as needed. Continue to adjust based on patient's symptom burden.                                 -  Start IV Haldol  1 mg every 4 hours as needed. Continue to adjust based on patient's symptom burden.                   -Secretions, in the setting of end-of-life care                               -Start IV glycopyrrolate  0.2 mg every 4 hours as needed.  # Psycho-social/Spiritual Support:  - Support System: Husband, children - Desire for further Chaplain support:yes  # Discharge Planning:  Anticipated Hospital Death  Thank you for allowing the palliative care team to participate in the care Alfrieda Claiborne Agreste.  Tinnie Radar, DO Palliative Care Provider PMT # [phone]  If patient remains symptomatic despite maximum doses, please call PMT at [phone] between 0700 and 1900. Outside of these hours, please call attending, as PMT does not have night coverage.  Billing based on MDM: High  Problems Addressed: One acute or chronic illness or injury that poses a threat to life or bodily function  Amount and/or Complexity of Data: Category 1:Review of prior external note(s) from each unique source, Review of the result(s) of each unique test, and Assessment requiring an independent historian(s), Category 2:Independent interpretation of a test performed by another  physician/other qualified health care professional (not separately reported), and Category 3:Discussion of management or test interpretation with external physician/other qualified health care professional/appropriate source (not separately reported)  Risks: Parenteral controlled substances and Decision not to resuscitate or to de-escalate care because of poor prognosis      [1] No Known Allergies  "

## 2024-07-07 NOTE — Progress Notes (Signed)
[facility] Liaison Note   Christina Gutierrez is a current patient with [facility] with a terminal diagnosis of liver cell carcinoma. Was notified by ED provider that patient had arrived and was in the Emergency Room. ACC was not notified by family prior to patient coming into the hospital.Patient admitted to hospital on 12.20.25 with Acute on chronic hypoxic respiratory failure. Per Dr. Cassandria Serve with [facility] this is a related hospital admission.    Visited the patient at bedside. There were several family member present and they were all tearful. Patient noted to be actively transitioning and having agonal breathing. [facility] Liaison held patient hand and provided emotional support.    Patient remains inpatient appropriate for IV medication for management of symptoms during EOL process.    V/S: 98.5, 93, 27      117/60       Sats 89% HFNC 60L/min   I/O: 400/ none documented   Abnormal Labs:  06/25/24 14:51 Lactic Acid, Venous: 2.2 (HH)  06/25/24 14:56 Comprehensive metabolic panel with GFR: Rpt ! Sodium: 129 (L) CO2: 21 (L) Glucose: 105 (H) Creatinine: 0.41 (L) Albumin: 3.0 (L) AST: 261 (H) ALT: 109 (H) Total Bilirubin: 3.1 (H) GFR, Estimated: >60 RBC: 2.79 (L) Hemoglobin: 8.9 (L) HCT: 26.3 (L) RDW: 23.9 (H) NEUT#: 7.8 (H) Lymphs Abs: 0.4 (L) Abs Immature Granulocytes: 0.09 (H) Burr Cells: PRESENT Polychromasia: PRESENT Target Cells: PRESENT Prothrombin Time: 21.4 (H) INR: 1.8 (H)  06/25/24 17:14 Pro Brain Natriuretic Peptide: 358.0 (H)  06/15/2024 02:50 Basic metabolic panel with GFR: Rpt ! Sodium: 131 (L) Potassium: 5.2 (H) CO2: 21 (L) Glucose: 116 (H) Creatinine: 0.38 (L) GFR, Estimated: >60 WBC: 11.9 (H) RBC: 2.78 (L) Hemoglobin: 8.7 (L) HCT: 26.5 (L) RDW: 23.3 (H) Fungitell Beta-D-Glucan: Rpt (IP)  Diagnostics:  DG Chest Portable 1 View  Date: 06/25/2024  Released By/Authorizing: Patsey Lot, MD (auto-released)   Narrative & Impression  CLINICAL DATA:  Shortness of breath and cough.   EXAM: PORTABLE CHEST 1 VIEW   COMPARISON:  06/20/2024   IMPRESSION: Development of diffuse heterogeneous bilateral lung opacities over the last 5 days. Differential considerations include multifocal pneumonia, pulmonary edema, or ARDS. Small bilateral pleural effusions.     Electronically Signed   By: Andrea Gasman M.D.   On: 06/25/2024 15:25    CT CHEST W CONTRAST  Date: 06/25/2024    Narrative & Impression CLINICAL DATA:  Provided history: Hemoptysis   Cough and shortness of breath.   IMPRESSION: 1. Extensive bilateral consolidative and ground-glass opacities throughout both lungs, greatest involvement in the right upper and middle lobes. Findings may be related to multifocal pneumonia, pulmonary hemorrhage or ARDS. Pulmonary edema is felt less likely although also a consideration. 2. Small bilateral pleural effusions. 3. Cardiomegaly. 4. Heterogeneous enlarged liver, incompletely included in the field of view. Hepatic masses better appreciated on prior abdominopelvic CT.   Aortic Atherosclerosis (ICD10-I70.0).     Electronically Signed   By: Andrea Gasman M.D.   On: 06/25/2024 18:21   IV/PRN medications: Dilaudid  1mg  IV x6, Dilaudid  0.5mg  IV x1, Solu-Medrol  40mg  IV x2, Maxipime  2g IV x2, HYDROmorphone  (DILAUDID ) 50 mg in 50 mL NS (1mg /mL) premix infusion continuous, Vancomycin  1000mg  IV x1, Tussionex 5mL PO x1, Robinul  0.2mg  IV x1, Ativan  0.5mg  IV x1, Ativan  1mg  IV x1, Ativan  2mg  IV x1, Zofran  4mg  IV x2, Compazine  10mg  IV x1   Assessment/Plan per Caleen Burgess BROCKS, MD on 12.21.2025:  Assessment & Plan:  Acute on chronic hypoxic respiratory failure ARDS/multifocal pneumonia Goals of care  GERD Advanced stage hepatocellular carcinoma Transaminitis   -Patient is currently comfort care on Dilaudid  drip.  It appears that she is going through the dying  process at this time.  Family would like to wait a little bit longer to take off her high flow.  She remains DNR/DNI comfort care.  Seen by palliative care  Family contact: Spoke with husband at bedside   IDT: updated   Goals of care: DNR- Comfort   Please call with any hospice questions or concerns.   Nat Babe, BSN, [facility] Liaison [phone]

## 2024-07-07 DEATH — deceased

## 2024-07-12 ENCOUNTER — Other Ambulatory Visit: Payer: Self-pay
